# Patient Record
Sex: Male | Born: 1965 | Race: White | Hispanic: No | Marital: Single | Smoking: Never smoker
Health system: Southern US, Community
[De-identification: ages and names within clinical notes are randomized; demographics above are authoritative.]

## PROBLEM LIST (undated history)

## (undated) DIAGNOSIS — F329 Major depressive disorder, single episode, unspecified: Secondary | ICD-10-CM

## (undated) DIAGNOSIS — F410 Panic disorder [episodic paroxysmal anxiety] without agoraphobia: Secondary | ICD-10-CM

## (undated) DIAGNOSIS — I10 Essential (primary) hypertension: Secondary | ICD-10-CM

## (undated) DIAGNOSIS — M940 Chondrocostal junction syndrome [Tietze]: Secondary | ICD-10-CM

## (undated) DIAGNOSIS — E0590 Thyrotoxicosis, unspecified without thyrotoxic crisis or storm: Secondary | ICD-10-CM

## (undated) DIAGNOSIS — K219 Gastro-esophageal reflux disease without esophagitis: Secondary | ICD-10-CM

## (undated) DIAGNOSIS — F419 Anxiety disorder, unspecified: Secondary | ICD-10-CM

## (undated) DIAGNOSIS — R5383 Other fatigue: Secondary | ICD-10-CM

## (undated) DIAGNOSIS — R251 Tremor, unspecified: Secondary | ICD-10-CM

## (undated) DIAGNOSIS — T6701XA Heatstroke and sunstroke, initial encounter: Secondary | ICD-10-CM

## (undated) DIAGNOSIS — N419 Inflammatory disease of prostate, unspecified: Secondary | ICD-10-CM

## (undated) DIAGNOSIS — K589 Irritable bowel syndrome without diarrhea: Secondary | ICD-10-CM

## (undated) DIAGNOSIS — F32A Depression, unspecified: Secondary | ICD-10-CM

## (undated) HISTORY — PX: MANDIBLE SURGERY: SHX707

## (undated) HISTORY — DX: Thyrotoxicosis, unspecified without thyrotoxic crisis or storm: E05.90

## (undated) HISTORY — DX: Tremor, unspecified: R25.1

## (undated) HISTORY — DX: Other fatigue: R53.83

## (undated) HISTORY — DX: Major depressive disorder, single episode, unspecified: F32.9

## (undated) HISTORY — DX: Essential (primary) hypertension: I10

## (undated) HISTORY — DX: Heatstroke and sunstroke, initial encounter: T67.01XA

## (undated) HISTORY — DX: Gastro-esophageal reflux disease without esophagitis: K21.9

## (undated) HISTORY — DX: Anxiety disorder, unspecified: F41.9

## (undated) HISTORY — DX: Chondrocostal junction syndrome (tietze): M94.0

## (undated) HISTORY — DX: Panic disorder (episodic paroxysmal anxiety): F41.0

## (undated) HISTORY — DX: Depression, unspecified: F32.A

## (undated) HISTORY — DX: Irritable bowel syndrome, unspecified: K58.9

## (undated) HISTORY — DX: Inflammatory disease of prostate, unspecified: N41.9

---

## 1994-09-14 ENCOUNTER — Encounter: Payer: Self-pay | Admitting: Gastroenterology

## 1994-09-15 ENCOUNTER — Encounter: Payer: Self-pay | Admitting: Gastroenterology

## 1995-12-07 ENCOUNTER — Encounter: Payer: Self-pay | Admitting: Gastroenterology

## 1997-08-02 ENCOUNTER — Encounter: Payer: Self-pay | Admitting: Gastroenterology

## 1997-08-26 ENCOUNTER — Encounter: Payer: Self-pay | Admitting: Gastroenterology

## 1997-08-27 ENCOUNTER — Encounter: Payer: Self-pay | Admitting: Gastroenterology

## 2004-08-28 ENCOUNTER — Ambulatory Visit: Payer: Self-pay | Admitting: Internal Medicine

## 2004-09-04 ENCOUNTER — Ambulatory Visit (HOSPITAL_COMMUNITY): Admission: RE | Admit: 2004-09-04 | Discharge: 2004-09-04 | Payer: Self-pay | Admitting: Internal Medicine

## 2005-04-29 ENCOUNTER — Ambulatory Visit: Payer: Self-pay | Admitting: Internal Medicine

## 2005-09-10 ENCOUNTER — Emergency Department (HOSPITAL_COMMUNITY): Admission: EM | Admit: 2005-09-10 | Discharge: 2005-09-10 | Payer: Self-pay | Admitting: Emergency Medicine

## 2005-09-10 ENCOUNTER — Ambulatory Visit: Payer: Self-pay | Admitting: Internal Medicine

## 2005-09-29 ENCOUNTER — Ambulatory Visit: Payer: Self-pay | Admitting: Internal Medicine

## 2006-02-09 ENCOUNTER — Ambulatory Visit: Payer: Self-pay | Admitting: Internal Medicine

## 2006-03-18 ENCOUNTER — Ambulatory Visit: Payer: Self-pay | Admitting: Internal Medicine

## 2006-03-18 ENCOUNTER — Ambulatory Visit (HOSPITAL_COMMUNITY): Admission: RE | Admit: 2006-03-18 | Discharge: 2006-03-18 | Payer: Self-pay | Admitting: Internal Medicine

## 2006-04-06 ENCOUNTER — Ambulatory Visit: Payer: Self-pay | Admitting: Internal Medicine

## 2006-05-23 ENCOUNTER — Ambulatory Visit: Payer: Self-pay | Admitting: Internal Medicine

## 2006-08-23 ENCOUNTER — Ambulatory Visit: Payer: Self-pay | Admitting: Internal Medicine

## 2006-12-02 ENCOUNTER — Ambulatory Visit: Payer: Self-pay | Admitting: Internal Medicine

## 2006-12-20 ENCOUNTER — Ambulatory Visit: Payer: Self-pay | Admitting: Internal Medicine

## 2007-01-17 ENCOUNTER — Ambulatory Visit: Payer: Self-pay | Admitting: Internal Medicine

## 2007-04-11 ENCOUNTER — Ambulatory Visit: Payer: Self-pay | Admitting: Internal Medicine

## 2007-04-11 LAB — CONVERTED CEMR LAB
ALT: 28 units/L (ref 0–53)
AST: 27 units/L (ref 0–37)
Albumin: 4.5 g/dL (ref 3.5–5.2)
Alkaline Phosphatase: 54 units/L (ref 39–117)
BUN: 9 mg/dL (ref 6–23)
Basophils Absolute: 0 10*3/uL (ref 0.0–0.1)
Basophils Relative: 0.6 % (ref 0.0–1.0)
Bilirubin Urine: NEGATIVE
Bilirubin, Direct: 0.2 mg/dL (ref 0.0–0.3)
CO2: 28 meq/L (ref 19–32)
Calcium: 9.4 mg/dL (ref 8.4–10.5)
Chloride: 105 meq/L (ref 96–112)
Cholesterol: 171 mg/dL (ref 0–200)
Creatinine, Ser: 1.1 mg/dL (ref 0.4–1.5)
Eosinophils Absolute: 0.1 10*3/uL (ref 0.0–0.6)
Eosinophils Relative: 0.8 % (ref 0.0–5.0)
GFR calc Af Amer: 95 mL/min
GFR calc non Af Amer: 79 mL/min
Glucose, Bld: 101 mg/dL — ABNORMAL HIGH (ref 70–99)
HCT: 39.9 % (ref 39.0–52.0)
HDL: 50.5 mg/dL (ref >39.0)
HIV-1 antibody: NEGATIVE
HIV-2 Ab: NEGATIVE
Hemoglobin, Urine: NEGATIVE
Hemoglobin: 13.3 g/dL (ref 13.0–17.0)
Ketones, ur: NEGATIVE mg/dL
LDL Cholesterol: 101 mg/dL — ABNORMAL HIGH (ref 0–99)
Leukocytes, UA: NEGATIVE
Lymphocytes Relative: 18.2 % (ref 12.0–46.0)
MCHC: 33.3 g/dL (ref 30.0–36.0)
MCV: 76.8 fL — ABNORMAL LOW (ref 78.0–100.0)
Monocytes Absolute: 0.4 10*3/uL (ref 0.2–0.7)
Monocytes Relative: 5.6 % (ref 3.0–11.0)
Neutro Abs: 5.2 10*3/uL (ref 1.4–7.7)
Neutrophils Relative %: 74.8 % (ref 43.0–77.0)
Nitrite: NEGATIVE
PSA: 0.81 ng/mL (ref 0.10–4.00)
Platelets: 312 10*3/uL (ref 150–400)
Potassium: 4.3 meq/L (ref 3.5–5.1)
RBC: 5.19 M/uL (ref 4.22–5.81)
RDW: 12.8 % (ref 11.5–14.6)
Sodium: 141 meq/L (ref 135–145)
Specific Gravity, Urine: 1.015 (ref 1.000–1.03)
TSH: 2.37 microintl units/mL (ref 0.35–5.50)
Total Bilirubin: 1.3 mg/dL — ABNORMAL HIGH (ref 0.3–1.2)
Total CHOL/HDL Ratio: 3.4
Total Protein, Urine: NEGATIVE mg/dL
Total Protein: 7.2 g/dL (ref 6.0–8.3)
Triglycerides: 96 mg/dL (ref 0–149)
Urine Glucose: NEGATIVE mg/dL
Urobilinogen, UA: 0.2 (ref 0.0–1.0)
VLDL: 19 mg/dL (ref 0–40)
WBC: 7 10*3/uL (ref 4.5–10.5)
pH: 6 (ref 5.0–8.0)

## 2007-06-07 ENCOUNTER — Ambulatory Visit: Payer: Self-pay | Admitting: Internal Medicine

## 2007-06-10 ENCOUNTER — Encounter: Payer: Self-pay | Admitting: Internal Medicine

## 2007-06-27 ENCOUNTER — Ambulatory Visit: Payer: Self-pay | Admitting: Internal Medicine

## 2007-06-27 ENCOUNTER — Encounter: Payer: Self-pay | Admitting: Internal Medicine

## 2007-06-27 DIAGNOSIS — F4323 Adjustment disorder with mixed anxiety and depressed mood: Secondary | ICD-10-CM | POA: Insufficient documentation

## 2007-08-29 ENCOUNTER — Ambulatory Visit: Payer: Self-pay | Admitting: Internal Medicine

## 2007-08-29 DIAGNOSIS — F419 Anxiety disorder, unspecified: Secondary | ICD-10-CM | POA: Insufficient documentation

## 2007-08-29 DIAGNOSIS — I10 Essential (primary) hypertension: Secondary | ICD-10-CM | POA: Insufficient documentation

## 2007-08-29 DIAGNOSIS — F410 Panic disorder [episodic paroxysmal anxiety] without agoraphobia: Secondary | ICD-10-CM | POA: Insufficient documentation

## 2007-08-29 DIAGNOSIS — R259 Unspecified abnormal involuntary movements: Secondary | ICD-10-CM | POA: Insufficient documentation

## 2007-08-29 LAB — CONVERTED CEMR LAB
Ceruloplasmin: 25 mg/dL (ref 21–63)
Vit D, 1,25-Dihydroxy: 60 (ref 30–89)

## 2007-09-04 LAB — CONVERTED CEMR LAB
ALT: 31 units/L (ref 0–53)
AST: 23 units/L (ref 0–37)
Albumin: 4.9 g/dL (ref 3.5–5.2)
Alkaline Phosphatase: 57 units/L (ref 39–117)
BUN: 10 mg/dL (ref 6–23)
Bilirubin, Direct: 0.2 mg/dL (ref 0.0–0.3)
CO2: 27 meq/L (ref 19–32)
Calcium: 9.9 mg/dL (ref 8.4–10.5)
Chloride: 96 meq/L (ref 96–112)
Cortisol, Plasma: 10.1 ug/dL
Creatinine, Ser: 1.1 mg/dL (ref 0.4–1.5)
Folate: 8.6 ng/mL
GFR calc Af Amer: 95 mL/min
GFR calc non Af Amer: 79 mL/min
Glucose, Bld: 98 mg/dL (ref 70–99)
Magnesium: 2.2 mg/dL (ref 1.5–2.5)
Potassium: 3.7 meq/L (ref 3.5–5.1)
Sed Rate: 2 mm/hr (ref 0–20)
Sodium: 137 meq/L (ref 135–145)
TSH: 2.63 microintl units/mL (ref 0.35–5.50)
Total Bilirubin: 1 mg/dL (ref 0.3–1.2)
Total Protein: 7.7 g/dL (ref 6.0–8.3)
Vitamin B-12: 380 pg/mL (ref 211–911)

## 2007-09-08 ENCOUNTER — Encounter: Payer: Self-pay | Admitting: Internal Medicine

## 2007-09-28 DIAGNOSIS — R5383 Other fatigue: Secondary | ICD-10-CM

## 2007-09-28 HISTORY — DX: Other fatigue: R53.83

## 2007-10-10 ENCOUNTER — Ambulatory Visit: Payer: Self-pay | Admitting: Internal Medicine

## 2007-10-10 DIAGNOSIS — G4700 Insomnia, unspecified: Secondary | ICD-10-CM | POA: Insufficient documentation

## 2007-10-10 DIAGNOSIS — R197 Diarrhea, unspecified: Secondary | ICD-10-CM | POA: Insufficient documentation

## 2007-10-10 DIAGNOSIS — R071 Chest pain on breathing: Secondary | ICD-10-CM | POA: Insufficient documentation

## 2007-10-11 ENCOUNTER — Encounter: Payer: Self-pay | Admitting: Internal Medicine

## 2007-10-23 ENCOUNTER — Encounter (INDEPENDENT_AMBULATORY_CARE_PROVIDER_SITE_OTHER): Payer: Self-pay | Admitting: *Deleted

## 2007-10-27 ENCOUNTER — Encounter: Payer: Self-pay | Admitting: Internal Medicine

## 2007-11-01 ENCOUNTER — Encounter: Payer: Self-pay | Admitting: Internal Medicine

## 2007-11-08 ENCOUNTER — Encounter: Payer: Self-pay | Admitting: Internal Medicine

## 2007-11-16 ENCOUNTER — Encounter: Payer: Self-pay | Admitting: Internal Medicine

## 2008-01-09 ENCOUNTER — Ambulatory Visit: Payer: Self-pay | Admitting: Internal Medicine

## 2008-01-09 DIAGNOSIS — R142 Eructation: Secondary | ICD-10-CM

## 2008-01-09 DIAGNOSIS — R143 Flatulence: Secondary | ICD-10-CM

## 2008-01-09 DIAGNOSIS — R141 Gas pain: Secondary | ICD-10-CM | POA: Insufficient documentation

## 2008-03-20 ENCOUNTER — Encounter: Payer: Self-pay | Admitting: Internal Medicine

## 2008-04-16 ENCOUNTER — Ambulatory Visit: Payer: Self-pay | Admitting: Internal Medicine

## 2008-04-16 DIAGNOSIS — R5381 Other malaise: Secondary | ICD-10-CM

## 2008-04-16 DIAGNOSIS — R5383 Other fatigue: Secondary | ICD-10-CM

## 2008-04-16 DIAGNOSIS — R5382 Chronic fatigue, unspecified: Secondary | ICD-10-CM | POA: Insufficient documentation

## 2008-06-28 ENCOUNTER — Ambulatory Visit: Payer: Self-pay | Admitting: Internal Medicine

## 2008-06-28 DIAGNOSIS — J45909 Unspecified asthma, uncomplicated: Secondary | ICD-10-CM | POA: Insufficient documentation

## 2008-06-28 DIAGNOSIS — J209 Acute bronchitis, unspecified: Secondary | ICD-10-CM

## 2008-07-01 ENCOUNTER — Ambulatory Visit: Payer: Self-pay | Admitting: Internal Medicine

## 2008-07-01 LAB — CONVERTED CEMR LAB
ALT: 26 units/L (ref 0–53)
AST: 21 units/L (ref 0–37)
Albumin: 3.8 g/dL (ref 3.5–5.2)
Alkaline Phosphatase: 70 units/L (ref 39–117)
BUN: 9 mg/dL (ref 6–23)
Basophils Absolute: 0.1 10*3/uL (ref 0.0–0.1)
Basophils Relative: 1 % (ref 0.0–3.0)
Bilirubin, Direct: 0.2 mg/dL (ref 0.0–0.3)
CO2: 30 meq/L (ref 19–32)
Calcium: 8.8 mg/dL (ref 8.4–10.5)
Chloride: 104 meq/L (ref 96–112)
Creatinine, Ser: 1.1 mg/dL (ref 0.4–1.5)
Eosinophils Absolute: 0.2 10*3/uL (ref 0.0–0.7)
Eosinophils Relative: 3.4 % (ref 0.0–5.0)
GFR calc Af Amer: 95 mL/min
GFR calc non Af Amer: 78 mL/min
Glucose, Bld: 65 mg/dL — ABNORMAL LOW (ref 70–99)
HCT: 41 % (ref 39.0–52.0)
HIV-1 antibody: NEGATIVE
HIV-2 Ab: NEGATIVE
Hemoglobin: 13.6 g/dL (ref 13.0–17.0)
Lymphocytes Relative: 17.4 % (ref 12.0–46.0)
MCHC: 33.1 g/dL (ref 30.0–36.0)
MCV: 77.7 fL — ABNORMAL LOW (ref 78.0–100.0)
Monocytes Absolute: 0.8 10*3/uL (ref 0.1–1.0)
Monocytes Relative: 10.6 % (ref 3.0–12.0)
Neutro Abs: 4.8 10*3/uL (ref 1.4–7.7)
Neutrophils Relative %: 67.6 % (ref 43.0–77.0)
Platelets: 320 10*3/uL (ref 150–400)
Potassium: 4.3 meq/L (ref 3.5–5.1)
RBC: 5.28 M/uL (ref 4.22–5.81)
RDW: 13.5 % (ref 11.5–14.6)
Sodium: 143 meq/L (ref 135–145)
TSH: 1.94 microintl units/mL (ref 0.35–5.50)
Total Bilirubin: 0.7 mg/dL (ref 0.3–1.2)
Total Protein: 6.5 g/dL (ref 6.0–8.3)
Vit D, 1,25-Dihydroxy: 22 — ABNORMAL LOW (ref 30–89)
WBC: 7.1 10*3/uL (ref 4.5–10.5)

## 2008-07-08 ENCOUNTER — Ambulatory Visit: Payer: Self-pay | Admitting: Internal Medicine

## 2008-07-10 ENCOUNTER — Telehealth: Payer: Self-pay | Admitting: Internal Medicine

## 2008-07-17 ENCOUNTER — Telehealth: Payer: Self-pay | Admitting: Internal Medicine

## 2008-08-05 ENCOUNTER — Ambulatory Visit: Payer: Self-pay | Admitting: Internal Medicine

## 2008-08-05 DIAGNOSIS — R1012 Left upper quadrant pain: Secondary | ICD-10-CM | POA: Insufficient documentation

## 2008-08-05 DIAGNOSIS — M545 Low back pain, unspecified: Secondary | ICD-10-CM | POA: Insufficient documentation

## 2008-08-05 LAB — CONVERTED CEMR LAB
BUN: 12 mg/dL (ref 6–23)
Basophils Absolute: 0.1 10*3/uL (ref 0.0–0.1)
Basophils Relative: 0.9 % (ref 0.0–3.0)
Bilirubin Urine: NEGATIVE
CO2: 32 meq/L (ref 19–32)
Calcium: 9.6 mg/dL (ref 8.4–10.5)
Chloride: 102 meq/L (ref 96–112)
Creatinine, Ser: 0.9 mg/dL (ref 0.4–1.5)
Eosinophils Absolute: 0.2 10*3/uL (ref 0.0–0.7)
Eosinophils Relative: 2.2 % (ref 0.0–5.0)
GFR calc Af Amer: 120 mL/min
GFR calc non Af Amer: 99 mL/min
Glucose, Bld: 100 mg/dL — ABNORMAL HIGH (ref 70–99)
HCT: 40.1 % (ref 39.0–52.0)
Hemoglobin, Urine: NEGATIVE
Hemoglobin: 13.5 g/dL (ref 13.0–17.0)
Ketones, ur: NEGATIVE mg/dL
Leukocytes, UA: NEGATIVE
Lymphocytes Relative: 17.8 % (ref 12.0–46.0)
MCHC: 33.8 g/dL (ref 30.0–36.0)
MCV: 77.8 fL — ABNORMAL LOW (ref 78.0–100.0)
Monocytes Absolute: 0.8 10*3/uL (ref 0.1–1.0)
Monocytes Relative: 7.4 % (ref 3.0–12.0)
Neutro Abs: 7.9 10*3/uL — ABNORMAL HIGH (ref 1.4–7.7)
Neutrophils Relative %: 71.7 % (ref 43.0–77.0)
Nitrite: NEGATIVE
Platelets: 247 10*3/uL (ref 150–400)
Potassium: 4.6 meq/L (ref 3.5–5.1)
RBC: 5.15 M/uL (ref 4.22–5.81)
RDW: 13.2 % (ref 11.5–14.6)
Sodium: 142 meq/L (ref 135–145)
Specific Gravity, Urine: <=1.005 (ref 1.000–1.03)
Total Protein, Urine: NEGATIVE mg/dL
Urine Glucose: NEGATIVE mg/dL
Urobilinogen, UA: 0.2 (ref 0.0–1.0)
WBC: 10.9 10*3/uL — ABNORMAL HIGH (ref 4.5–10.5)
pH: 6.5 (ref 5.0–8.0)

## 2008-08-06 ENCOUNTER — Telehealth: Payer: Self-pay | Admitting: Internal Medicine

## 2008-08-06 ENCOUNTER — Ambulatory Visit: Payer: Self-pay | Admitting: Internal Medicine

## 2008-08-12 ENCOUNTER — Telehealth: Payer: Self-pay | Admitting: Internal Medicine

## 2008-08-16 ENCOUNTER — Ambulatory Visit: Payer: Self-pay | Admitting: Internal Medicine

## 2008-08-16 DIAGNOSIS — R300 Dysuria: Secondary | ICD-10-CM | POA: Insufficient documentation

## 2008-08-19 ENCOUNTER — Telehealth: Payer: Self-pay | Admitting: Internal Medicine

## 2008-08-19 LAB — CONVERTED CEMR LAB
Bilirubin Urine: NEGATIVE
Hemoglobin, Urine: NEGATIVE
Ketones, ur: NEGATIVE mg/dL
Leukocytes, UA: NEGATIVE
Nitrite: NEGATIVE
Specific Gravity, Urine: <=1.005 (ref 1.000–1.03)
Total Protein, Urine: NEGATIVE mg/dL
Urine Glucose: NEGATIVE mg/dL
Urobilinogen, UA: 0.2 (ref 0.0–1.0)
pH: 6.5 (ref 5.0–8.0)

## 2008-08-26 ENCOUNTER — Ambulatory Visit: Payer: Self-pay | Admitting: Internal Medicine

## 2008-08-26 ENCOUNTER — Telehealth: Payer: Self-pay | Admitting: Internal Medicine

## 2008-08-26 DIAGNOSIS — L988 Other specified disorders of the skin and subcutaneous tissue: Secondary | ICD-10-CM | POA: Insufficient documentation

## 2008-08-26 DIAGNOSIS — A6000 Herpesviral infection of urogenital system, unspecified: Secondary | ICD-10-CM | POA: Insufficient documentation

## 2008-08-26 LAB — CONVERTED CEMR LAB: Herpes Simplex Vrs I&II-IgM Ab (EIA): DETECTED — AB

## 2008-09-12 ENCOUNTER — Emergency Department (HOSPITAL_COMMUNITY): Admission: EM | Admit: 2008-09-12 | Discharge: 2008-09-12 | Payer: Self-pay | Admitting: Emergency Medicine

## 2008-11-13 ENCOUNTER — Ambulatory Visit: Payer: Self-pay | Admitting: Internal Medicine

## 2008-11-21 ENCOUNTER — Telehealth: Payer: Self-pay | Admitting: Internal Medicine

## 2008-11-27 ENCOUNTER — Telehealth: Payer: Self-pay | Admitting: Internal Medicine

## 2008-11-28 ENCOUNTER — Telehealth: Payer: Self-pay | Admitting: Internal Medicine

## 2009-01-06 ENCOUNTER — Telehealth: Payer: Self-pay | Admitting: Internal Medicine

## 2009-02-11 ENCOUNTER — Ambulatory Visit: Payer: Self-pay | Admitting: Internal Medicine

## 2009-02-11 DIAGNOSIS — R159 Full incontinence of feces: Secondary | ICD-10-CM | POA: Insufficient documentation

## 2009-02-11 DIAGNOSIS — F410 Panic disorder [episodic paroxysmal anxiety] without agoraphobia: Secondary | ICD-10-CM | POA: Insufficient documentation

## 2009-02-11 HISTORY — DX: Panic disorder (episodic paroxysmal anxiety): F41.0

## 2009-02-11 LAB — CONVERTED CEMR LAB: Vit D, 25-Hydroxy: 40 ng/mL (ref 30–89)

## 2009-02-12 ENCOUNTER — Encounter (INDEPENDENT_AMBULATORY_CARE_PROVIDER_SITE_OTHER): Payer: Self-pay | Admitting: *Deleted

## 2009-02-13 LAB — CONVERTED CEMR LAB
ALT: 48 units/L (ref 0–53)
AST: 34 units/L (ref 0–37)
Albumin: 4.2 g/dL (ref 3.5–5.2)
Alkaline Phosphatase: 59 units/L (ref 39–117)
BUN: 8 mg/dL (ref 6–23)
Basophils Absolute: 0 10*3/uL (ref 0.0–0.1)
Basophils Relative: 0.3 % (ref 0.0–3.0)
Bilirubin Urine: NEGATIVE
Bilirubin, Direct: 0.2 mg/dL (ref 0.0–0.3)
CO2: 31 meq/L (ref 19–32)
Calcium: 9.4 mg/dL (ref 8.4–10.5)
Chloride: 106 meq/L (ref 96–112)
Creatinine, Ser: 1 mg/dL (ref 0.4–1.5)
Eosinophils Absolute: 0.2 10*3/uL (ref 0.0–0.7)
Eosinophils Relative: 3.5 % (ref 0.0–5.0)
Folate: 9.8 ng/mL
GFR calc non Af Amer: 87.16 mL/min (ref >60)
Glucose, Bld: 89 mg/dL (ref 70–99)
HCT: 38.7 % — ABNORMAL LOW (ref 39.0–52.0)
Hemoglobin, Urine: NEGATIVE
Hemoglobin: 12.9 g/dL — ABNORMAL LOW (ref 13.0–17.0)
Ketones, ur: NEGATIVE mg/dL
Leukocytes, UA: NEGATIVE
Lipase: 4 units/L — ABNORMAL LOW (ref 11.0–59.0)
Lymphocytes Relative: 34.3 % (ref 12.0–46.0)
Lymphs Abs: 2.3 10*3/uL (ref 0.7–4.0)
MCHC: 33.4 g/dL (ref 30.0–36.0)
MCV: 78.3 fL (ref 78.0–100.0)
Monocytes Absolute: 0.4 10*3/uL (ref 0.1–1.0)
Monocytes Relative: 5.6 % (ref 3.0–12.0)
Neutro Abs: 3.8 10*3/uL (ref 1.4–7.7)
Neutrophils Relative %: 56.3 % (ref 43.0–77.0)
Nitrite: NEGATIVE
Platelets: 304 10*3/uL (ref 150.0–400.0)
Potassium: 4.4 meq/L (ref 3.5–5.1)
RBC: 4.94 M/uL (ref 4.22–5.81)
RDW: 12.9 % (ref 11.5–14.6)
Sed Rate: 9 mm/hr (ref 0–22)
Sodium: 143 meq/L (ref 135–145)
Specific Gravity, Urine: <=1.005 (ref 1.000–1.030)
TSH: 1.82 microintl units/mL (ref 0.35–5.50)
Total Bilirubin: 0.7 mg/dL (ref 0.3–1.2)
Total Protein, Urine: NEGATIVE mg/dL
Total Protein: 7.5 g/dL (ref 6.0–8.3)
Urine Glucose: NEGATIVE mg/dL
Urobilinogen, UA: 0.2 (ref 0.0–1.0)
Vitamin B-12: 534 pg/mL (ref 211–911)
WBC: 6.7 10*3/uL (ref 4.5–10.5)
pH: 7 (ref 5.0–8.0)

## 2009-02-20 ENCOUNTER — Encounter: Payer: Self-pay | Admitting: Internal Medicine

## 2009-03-10 ENCOUNTER — Ambulatory Visit: Payer: Self-pay | Admitting: Gastroenterology

## 2009-03-10 DIAGNOSIS — D649 Anemia, unspecified: Secondary | ICD-10-CM | POA: Insufficient documentation

## 2009-03-10 DIAGNOSIS — R1032 Left lower quadrant pain: Secondary | ICD-10-CM | POA: Insufficient documentation

## 2009-03-11 LAB — CONVERTED CEMR LAB: Tissue Transglutaminase Ab, IgA: 0.3 units (ref <7)

## 2009-03-13 ENCOUNTER — Encounter: Payer: Self-pay | Admitting: Internal Medicine

## 2009-03-19 ENCOUNTER — Ambulatory Visit: Payer: Self-pay | Admitting: Internal Medicine

## 2009-03-21 ENCOUNTER — Ambulatory Visit: Payer: Self-pay | Admitting: Gastroenterology

## 2009-03-21 ENCOUNTER — Telehealth: Payer: Self-pay | Admitting: Gastroenterology

## 2009-03-24 ENCOUNTER — Encounter (INDEPENDENT_AMBULATORY_CARE_PROVIDER_SITE_OTHER): Payer: Self-pay

## 2009-03-24 LAB — CONVERTED CEMR LAB
Iron: 79 ug/dL (ref 42–165)
Saturation Ratios: 19.4 % — ABNORMAL LOW (ref 20.0–50.0)
Transferrin: 291.4 mg/dL (ref 212.0–360.0)

## 2009-04-10 ENCOUNTER — Telehealth: Payer: Self-pay | Admitting: Internal Medicine

## 2009-04-16 ENCOUNTER — Encounter: Payer: Self-pay | Admitting: Gastroenterology

## 2009-04-16 ENCOUNTER — Ambulatory Visit: Payer: Self-pay | Admitting: Gastroenterology

## 2009-04-18 ENCOUNTER — Encounter: Payer: Self-pay | Admitting: Gastroenterology

## 2009-05-05 ENCOUNTER — Ambulatory Visit: Payer: Self-pay | Admitting: Internal Medicine

## 2009-05-29 ENCOUNTER — Encounter: Payer: Self-pay | Admitting: Internal Medicine

## 2009-08-11 ENCOUNTER — Ambulatory Visit: Payer: Self-pay | Admitting: Internal Medicine

## 2009-08-13 LAB — CONVERTED CEMR LAB
ALT: 40 units/L (ref 0–53)
AST: 25 units/L (ref 0–37)
Albumin: 4.8 g/dL (ref 3.5–5.2)
Alkaline Phosphatase: 56 units/L (ref 39–117)
BUN: 8 mg/dL (ref 6–23)
Basophils Absolute: 0 10*3/uL (ref 0.0–0.1)
Basophils Relative: 0.4 % (ref 0.0–3.0)
Bilirubin, Direct: 0.2 mg/dL (ref 0.0–0.3)
CO2: 30 meq/L (ref 19–32)
Calcium: 9.8 mg/dL (ref 8.4–10.5)
Chloride: 100 meq/L (ref 96–112)
Creatinine, Ser: 1.1 mg/dL (ref 0.4–1.5)
Eosinophils Absolute: 0.2 10*3/uL (ref 0.0–0.7)
Eosinophils Relative: 2.8 % (ref 0.0–5.0)
Folate: 14.7 ng/mL
GFR calc non Af Amer: 77.89 mL/min (ref >60)
Glucose, Bld: 101 mg/dL — ABNORMAL HIGH (ref 70–99)
HCT: 42.4 % (ref 39.0–52.0)
Hemoglobin: 13.7 g/dL (ref 13.0–17.0)
IgE (Immunoglobulin E), Serum: 16.2 intl units/mL (ref 0.0–180.0)
Lymphocytes Relative: 25.8 % (ref 12.0–46.0)
Lymphs Abs: 2 10*3/uL (ref 0.7–4.0)
MCHC: 32.3 g/dL (ref 30.0–36.0)
MCV: 81.8 fL (ref 78.0–100.0)
Monocytes Absolute: 0.5 10*3/uL (ref 0.1–1.0)
Monocytes Relative: 6.3 % (ref 3.0–12.0)
Neutro Abs: 5.2 10*3/uL (ref 1.4–7.7)
Neutrophils Relative %: 64.7 % (ref 43.0–77.0)
Platelets: 307 10*3/uL (ref 150.0–400.0)
Potassium: 4.5 meq/L (ref 3.5–5.1)
RBC: 5.19 M/uL (ref 4.22–5.81)
RDW: 12.6 % (ref 11.5–14.6)
Sodium: 141 meq/L (ref 135–145)
TSH: 2.08 microintl units/mL (ref 0.35–5.50)
Total Bilirubin: 1.2 mg/dL (ref 0.3–1.2)
Total Protein: 8 g/dL (ref 6.0–8.3)
Vitamin B-12: 505 pg/mL (ref 211–911)
WBC: 7.9 10*3/uL (ref 4.5–10.5)

## 2009-09-17 ENCOUNTER — Telehealth: Payer: Self-pay | Admitting: Internal Medicine

## 2009-09-23 ENCOUNTER — Telehealth: Payer: Self-pay | Admitting: Internal Medicine

## 2009-10-22 ENCOUNTER — Telehealth: Payer: Self-pay | Admitting: Internal Medicine

## 2009-11-10 ENCOUNTER — Ambulatory Visit: Payer: Self-pay | Admitting: Internal Medicine

## 2009-11-10 DIAGNOSIS — N419 Inflammatory disease of prostate, unspecified: Secondary | ICD-10-CM | POA: Insufficient documentation

## 2009-11-10 LAB — CONVERTED CEMR LAB
Basophils Absolute: 0 10*3/uL (ref 0.0–0.1)
Basophils Relative: 0.7 % (ref 0.0–3.0)
Bilirubin Urine: NEGATIVE
Eosinophils Absolute: 0.3 10*3/uL (ref 0.0–0.7)
Eosinophils Relative: 4.5 % (ref 0.0–5.0)
HCT: 40 % (ref 39.0–52.0)
Hemoglobin, Urine: NEGATIVE
Hemoglobin: 13.1 g/dL (ref 13.0–17.0)
Ketones, ur: NEGATIVE mg/dL
Leukocytes, UA: NEGATIVE
Lymphocytes Relative: 24.4 % (ref 12.0–46.0)
Lymphs Abs: 1.6 10*3/uL (ref 0.7–4.0)
MCHC: 32.6 g/dL (ref 30.0–36.0)
MCV: 79 fL (ref 78.0–100.0)
Monocytes Absolute: 0.6 10*3/uL (ref 0.1–1.0)
Monocytes Relative: 8.8 % (ref 3.0–12.0)
Neutro Abs: 4 10*3/uL (ref 1.4–7.7)
Neutrophils Relative %: 61.6 % (ref 43.0–77.0)
Nitrite: NEGATIVE
Platelets: 292 10*3/uL (ref 150.0–400.0)
RBC: 5.07 M/uL (ref 4.22–5.81)
RDW: 14.2 % (ref 11.5–14.6)
Specific Gravity, Urine: >=1.03 (ref 1.000–1.030)
Total Protein, Urine: NEGATIVE mg/dL
Urine Glucose: NEGATIVE mg/dL
Urobilinogen, UA: 0.2 (ref 0.0–1.0)
WBC: 6.5 10*3/uL (ref 4.5–10.5)
pH: 5 (ref 5.0–8.0)

## 2009-11-14 LAB — CONVERTED CEMR LAB
HIV-1 antibody: NEGATIVE
HIV-2 Ab: NEGATIVE

## 2009-12-05 ENCOUNTER — Telehealth: Payer: Self-pay | Admitting: Endocrinology

## 2010-01-26 ENCOUNTER — Ambulatory Visit: Payer: Self-pay | Admitting: Internal Medicine

## 2010-02-06 ENCOUNTER — Telehealth: Payer: Self-pay | Admitting: Internal Medicine

## 2010-02-11 ENCOUNTER — Telehealth: Payer: Self-pay | Admitting: Internal Medicine

## 2010-02-12 ENCOUNTER — Encounter: Payer: Self-pay | Admitting: Internal Medicine

## 2010-03-25 ENCOUNTER — Telehealth: Payer: Self-pay | Admitting: Internal Medicine

## 2010-04-14 ENCOUNTER — Ambulatory Visit: Payer: Self-pay | Admitting: Internal Medicine

## 2010-05-13 ENCOUNTER — Telehealth: Payer: Self-pay | Admitting: Internal Medicine

## 2010-06-08 ENCOUNTER — Telehealth: Payer: Self-pay | Admitting: Internal Medicine

## 2010-06-22 ENCOUNTER — Ambulatory Visit: Payer: Self-pay | Admitting: Internal Medicine

## 2010-06-23 LAB — CONVERTED CEMR LAB
ALT: 38 units/L (ref 0–53)
AST: 24 units/L (ref 0–37)
Albumin: 4.5 g/dL (ref 3.5–5.2)
Alkaline Phosphatase: 50 units/L (ref 39–117)
BUN: 13 mg/dL (ref 6–23)
Basophils Absolute: 0.1 10*3/uL (ref 0.0–0.1)
Basophils Relative: 0.9 % (ref 0.0–3.0)
Bilirubin, Direct: 0.1 mg/dL (ref 0.0–0.3)
CO2: 27 meq/L (ref 19–32)
Calcium: 9.6 mg/dL (ref 8.4–10.5)
Chloride: 107 meq/L (ref 96–112)
Creatinine, Ser: 0.9 mg/dL (ref 0.4–1.5)
Eosinophils Absolute: 0.4 10*3/uL (ref 0.0–0.7)
Eosinophils Relative: 5.3 % — ABNORMAL HIGH (ref 0.0–5.0)
GFR calc non Af Amer: 94.16 mL/min (ref >60)
Glucose, Bld: 91 mg/dL (ref 70–99)
HCT: 41.3 % (ref 39.0–52.0)
Hemoglobin: 13.7 g/dL (ref 13.0–17.0)
Lymphocytes Relative: 38.4 % (ref 12.0–46.0)
Lymphs Abs: 2.7 10*3/uL (ref 0.7–4.0)
MCHC: 33.1 g/dL (ref 30.0–36.0)
MCV: 77 fL — ABNORMAL LOW (ref 78.0–100.0)
Monocytes Absolute: 0.6 10*3/uL (ref 0.1–1.0)
Monocytes Relative: 8.4 % (ref 3.0–12.0)
Neutro Abs: 3.3 10*3/uL (ref 1.4–7.7)
Neutrophils Relative %: 47 % (ref 43.0–77.0)
Platelets: 274 10*3/uL (ref 150.0–400.0)
Potassium: 4.5 meq/L (ref 3.5–5.1)
RBC: 5.37 M/uL (ref 4.22–5.81)
RDW: 14.8 % — ABNORMAL HIGH (ref 11.5–14.6)
Sodium: 141 meq/L (ref 135–145)
TSH: 3.33 microintl units/mL (ref 0.35–5.50)
Total Bilirubin: 0.6 mg/dL (ref 0.3–1.2)
Total Protein: 7.2 g/dL (ref 6.0–8.3)
WBC: 7.1 10*3/uL (ref 4.5–10.5)

## 2010-06-24 LAB — CONVERTED CEMR LAB: Lithium Lvl: 0.14 meq/L — ABNORMAL LOW (ref 0.80–1.40)

## 2010-07-21 ENCOUNTER — Ambulatory Visit: Payer: Self-pay | Admitting: Internal Medicine

## 2010-07-23 ENCOUNTER — Encounter: Payer: Self-pay | Admitting: Internal Medicine

## 2010-08-14 ENCOUNTER — Telehealth: Payer: Self-pay | Admitting: Internal Medicine

## 2010-09-25 ENCOUNTER — Ambulatory Visit: Payer: Self-pay | Admitting: Internal Medicine

## 2010-10-22 ENCOUNTER — Telehealth: Payer: Self-pay | Admitting: Internal Medicine

## 2010-10-22 ENCOUNTER — Ambulatory Visit
Admission: RE | Admit: 2010-10-22 | Discharge: 2010-10-22 | Payer: Self-pay | Source: Home / Self Care | Attending: Internal Medicine | Admitting: Internal Medicine

## 2010-10-22 ENCOUNTER — Emergency Department (HOSPITAL_COMMUNITY)
Admission: EM | Admit: 2010-10-22 | Discharge: 2010-10-22 | Payer: Self-pay | Source: Home / Self Care | Admitting: Emergency Medicine

## 2010-10-22 ENCOUNTER — Other Ambulatory Visit: Payer: Self-pay | Admitting: Internal Medicine

## 2010-10-22 DIAGNOSIS — N23 Unspecified renal colic: Secondary | ICD-10-CM | POA: Insufficient documentation

## 2010-10-22 LAB — DIFFERENTIAL
Basophils Relative: 1 % (ref 0–1)
Eosinophils Absolute: 0.2 10*3/uL (ref 0.0–0.7)
Monocytes Absolute: 0.7 10*3/uL (ref 0.1–1.0)
Neutro Abs: 6.7 10*3/uL (ref 1.7–7.7)
Neutrophils Relative %: 72 % (ref 43–77)

## 2010-10-22 LAB — CBC
MCH: 24.5 pg — ABNORMAL LOW (ref 26.0–34.0)
MCHC: 33.5 g/dL (ref 30.0–36.0)
Platelets: 300 10*3/uL (ref 150–400)

## 2010-10-22 LAB — URINALYSIS, ROUTINE W REFLEX MICROSCOPIC
Bilirubin Urine: NEGATIVE
Ketones, ur: NEGATIVE
Ketones, ur: NEGATIVE mg/dL
Nitrite: NEGATIVE
Nitrite: NEGATIVE
Protein, ur: NEGATIVE mg/dL
Specific Gravity, Urine: <=1.005 (ref 1.000–1.030)
pH: 5.5 (ref 5.0–8.0)

## 2010-10-22 LAB — BASIC METABOLIC PANEL
CO2: 25 mEq/L (ref 19–32)
Calcium: 9.9 mg/dL (ref 8.4–10.5)
Creatinine, Ser: 1.21 mg/dL (ref 0.4–1.5)
GFR calc Af Amer: >60 mL/min (ref >60)

## 2010-10-23 ENCOUNTER — Ambulatory Visit: Admit: 2010-10-23 | Payer: Self-pay | Admitting: Internal Medicine

## 2010-10-23 LAB — URINE CULTURE
Colony Count: NO GROWTH
Culture  Setup Time: 201201270016

## 2010-10-23 LAB — GC/CHLAMYDIA PROBE AMP, GENITAL: GC Probe Amp, Genital: NEGATIVE

## 2010-10-29 NOTE — Progress Notes (Signed)
Summary: Refill request  Phone Note Refill Request Message from:  Pharmacy  Refills Requested: Medication #1:  LEVSIN 0.125 MG TABS 1-2 by mouth 2 times daily as needed for abdominal pain   Supply Requested: 6 months  Medication #2:  ACYCLOVIR 400 MG TABS 1 by mouth qid   Supply Requested: 6 months Initial call taken by: Lanier Prude, Beverly Gust),  March 25, 2010 11:38 AM    Prescriptions: ACYCLOVIR 400 MG TABS (ACYCLOVIR) 1 by mouth qid  #28 x 1   Entered and Authorized by:   Etta Grandchild MD   Signed by:   Etta Grandchild MD on 03/25/2010   Method used:   [facility]        [facility] [phone]* (retail)       [address]       [place], [place]  [address]       Ph: [phone] or [phone]       Fax: [phone]   RxID:   [id number] LEVSIN 0.125 MG TABS (HYOSCYAMINE SULFATE) 1-2 by mouth 2 times daily as needed for abdominal pain  #120 x 3   Entered and Authorized by:   Etta Grandchild MD   Signed by:   Etta Grandchild MD on 03/25/2010   Method used:   [facility]        [facility] [phone]* (retail)       [address]       [place], [place]  [address]       Ph: [phone] or [phone]       Fax: [phone]   RxID:   [id number]

## 2010-10-29 NOTE — Assessment & Plan Note (Signed)
Summary: 2 mos f/u // # / cd   Vital Signs:  Patient profile:   45 year old male Height:      68 inches (172.72 cm) Weight:      189 pounds (85.91 kg) BMI:     28.84 O2 Sat:      97 % on Room air Temp:     98.0 degrees F (36.67 degrees C) oral Pulse rate:   96 / minute Pulse rhythm:   regular Resp:     16 per minute BP sitting:   138 / 88  (left arm) Cuff size:   regular  Vitals Entered By: Lanier Prude, CMA(AAMA) (April 14, 2010 2:27 PM)  O2 Flow:  Room air CC: 2 mo f/u Is Patient Diabetic? No Comments pt is not taking Depakote ER 250, Depakote 125mg  or Welchol 3.75   Primary Care Provider:  Sonda Primes, MD  CC:  2 mo f/u.  History of Present Illness: The patient presents for a follow up of back pain, anxiety, depression and headaches - all worse.  C/o more depression, feeling bad all the time; a lot of sadness. He  does not want to get out of the house any more...  Current Medications (verified): 1)  Propranolol Hcl 20 Mg  Tabs (Propranolol Hcl) .[name].. 1 By Mouth Tid 2)  Hydrocodone-Acetaminophen 10-650 Mg  Tabs (Hydrocodone-Acetaminophen) .... Qid As Needed 3)  Vitamin D3 1000 Unit  Tabs (Cholecalciferol) .... 2 Once Daily By Mouth 4)  Ibuprofen 400 Mg  Tabs (Ibuprofen) .[name].. 1 By Mouth Qid As Needed Pain 5)  Budeprion Sr 150 Mg  Xr12h-Tab (Bupropion Hcl) .[name].. 1 By Mouth Bid 6)  Savella 50 Mg  Tabs (Milnacipran Hcl) .[name].. 1 By Mouth Bid 7)  Alprazolam Xr 1 Mg Xr24h-Tab (Alprazolam) .[name].. 1 By Mouth Qam 8)  Levsin 0.125 Mg Tabs (Hyoscyamine Sulfate) .[name].. 1-2 By Mouth 2 Times Daily As Needed For Abdominal Pain 9)  Robinul-Forte 2 Mg Tabs (Glycopyrrolate) .... One Tablet By Mouth Two Times A Day 10)  Repliva 21/7 .... 1 By Mouth Once Daily 11)  Hydroxyzine Hcl 50 Mg Tabs (Hydroxyzine Hcl) .[name].. 1-2 By Mouth By Mouth Two Times A Day As Needed Anxiety 12)  Alprazolam 2 Mg Tabs (Alprazolam) .[name].. 1 By Mouth Three Times A Day As Needed Anxiety 13)  Acyclovir 400 Mg Tabs  (Acyclovir) .[name].. 1 By Mouth Qid 14)  Depakote Er 250 Mg Xr24h-Tab (Divalproex Sodium) .[name].. 1 By Mouth Qd 15)  Welchol 3.75 Gm Pack (Colesevelam Hcl) .[name].. 1 By Mouth Qd 16)  Depakote 125 Mg Tbec (Divalproex Sodium) .[name].. 1 By Mouth Bid  Allergies (verified): 1)  ! 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  The patient denies anorexia, weight loss, weight gain, chest pain, dyspnea on exertion, abdominal pain, and melena.    Physical Exam  General:  In no distress, overweight-appearing.   Head:  Normocephalic and atraumatic without obvious abnormalities. No apparent alopecia or balding. Eyes:  No corneal  or conjunctival inflammation noted. EOMI. Perrla.  Ears:  External ear exam shows no significant lesions or deformities.  Otoscopic examination reveals clear canals, tympanic membranes are intact bilaterally without bulging, retraction, inflammation or discharge. Hearing is grossly normal bilaterally. Mouth:  Oral mucosa and oropharynx without lesions or exudates.  Teeth in good repair. Neck:  No deformities, masses, or tenderness noted. Lungs:  Normal respiratory effort, chest expands symmetrically. Lungs are clear to auscultation, no crackles or wheezes. Heart:  Normal rate and regular rhythm. S1 and S2 normal without gallop, murmur, click, rub or other extra sounds. Abdomen:  Bowel sounds positive,abdomen soft and non-tender without  masses, organomegaly or hernias noted. Msk:  No deformity or scoliosis noted of thoracic or lumbar spine.   Neurologic:  alert & oriented X3 and gait normal.  Tremor in B hands is present Skin:  WNL Cervical Nodes:  L anterior LN tender.   Psych:  Not suisidal Oriented X3, not homicidal, depressed affect, subdued,  tearful, less severely anxious, poor concentration, and judgment fair.     Impression & Recommendations:  Problem # 1:  PANIC DISORDER (ICD-300.01) Assessment Unchanged  His updated medication list for this problem includes:    Budeprion Sr 150 Mg Xr12h-tab (Bupropion hcl) .[name]... 1 by mouth bid    Alprazolam Xr 1 Mg Xr24h-tab (Alprazolam) .[name]... 1 by mouth qam    Hydroxyzine Hcl 50 Mg Tabs (Hydroxyzine hcl) .[name]... 1-2 by mouth by mouth two times a day as needed anxiety    Alprazolam 2 Mg Tabs (Alprazolam) .[name]... 1 by mouth three times a day as needed anxiety  Problem # 2:  DEPRESSION (ICD-311) Assessment: Deteriorated I'm running out of options here. We wwill try Lithium. Risks vs benefits and controversies of a long term Lithium use were discussed.  His updated medication list for this problem includes:    Budeprion Sr 150 Mg Xr12h-tab (Bupropion hcl) .[name]... 1 by mouth bid    Alprazolam Xr 1 Mg Xr24h-tab (Alprazolam) .[name]... 1 by mouth qam    Hydroxyzine Hcl 50 Mg Tabs (Hydroxyzine hcl) .[name]... 1-2 by mouth by mouth two times a day as needed anxiety    Alprazolam 2 Mg Tabs (Alprazolam) .[name]... 1 by mouth three times a day as needed anxiety  Orders: Psychiatric Referral (Psych)  Problem # 3:  HYPERTENSION (ICD-401.1) Assessment: Unchanged  The following medications were removed from the medication list:    Propranolol Hcl 20 Mg Tabs (Propranolol hcl) .[name]... 1 by mouth tid His updated medication list for this problem includes:    Bystolic 10 Mg Tabs (Nebivolol hcl) .[name]... 1 by mouth qd  BP today: 138/88 Prior BP: 130/72 (01/26/2010)  Prior 10 Yr Risk Heart Disease: 4 %  (08/26/2008)  Labs Reviewed: K+: 4.5 (08/11/2009) Creat: : 1.1 (08/11/2009)   Chol: 171 (04/11/2007)   HDL: 50.5 (04/11/2007)   LDL: 101 (04/11/2007)   TG: 96 (04/11/2007)  Problem # 4:  TREMOR (ICD-781.0) Assessment: Unchanged  Problem # 5:  ANXIETY (ICD-300.00) Assessment: Deteriorated  His updated medication list for this problem includes:    Budeprion Sr 150 Mg Xr12h-tab (Bupropion hcl) .[name]... 1 by mouth bid    Alprazolam Xr 1 Mg Xr24h-tab (Alprazolam) .[name]... 1 by mouth qam    Hydroxyzine Hcl 50 Mg Tabs (Hydroxyzine hcl) .[name]... 1-2 by mouth by mouth two times a day as needed anxiety    Alprazolam 2 Mg Tabs (Alprazolam) .[name]... 1 by mouth three times a day as needed anxiety  Complete Medication List: 1)  Hydrocodone-acetaminophen 10-650 Mg Tabs (Hydrocodone-acetaminophen) .... Qid as needed 2)  Vitamin D3 1000 Unit Tabs (Cholecalciferol) .... 2 once daily by mouth 3)  Ibuprofen 400 Mg Tabs (Ibuprofen) .[name].. 1 by mouth qid as needed pain 4)  Budeprion Sr 150 Mg Xr12h-tab (Bupropion hcl) .[name].. 1 by mouth bid 5)  Savella 50 Mg Tabs (Milnacipran hcl) .[name].. 1 by mouth bid 6)  Alprazolam Xr 1 Mg Xr24h-tab (Alprazolam) .[name].. 1 by mouth qam 7)  Levsin 0.125 Mg Tabs (Hyoscyamine sulfate) .[name].. 1-2 by mouth 2 times daily as needed for abdominal pain 8)  Robinul-forte 2 Mg Tabs (Glycopyrrolate) .... One tablet by mouth two times a day 9)  Hydroxyzine Hcl 50 Mg Tabs (Hydroxyzine hcl) .[name].. 1-2 by mouth by mouth two times a day as needed anxiety 10)  Alprazolam 2 Mg Tabs (Alprazolam) .[name].. 1 by mouth three times a day as needed anxiety 11)  Acyclovir 400 Mg Tabs (Acyclovir) .[name].. 1 by mouth qid 12)  Lithium Carbonate 150 Mg Caps (Lithium carbonate) .[name].. 1 by mouth at bedtime x 1 wk, then 1 two times a day x 1 wk, then 1 in am and 2 at hs x 1 wk, then 2 by mouth bid 13)  Repliva 21/7  .... 1 by mouth once daily 14)  Bystolic 10 Mg Tabs (Nebivolol hcl) .[name].. 1 by mouth qd  Patient Instructions: 1)  Please  schedule a follow-up appointment in 6 wks 2)  BMP prior to visit, ICD-9: 3)  Hepatic Panel prior to visit, ICD-9: 4)  TSH prior to visit, ICD-9: 5)  CBC w/ Diff prior to visit, ICD-9: 6)  Lithium level 7)  Call if you are not better in a reasonable amount of time or if worse.  Prescriptions: BYSTOLIC 10 MG TABS (NEBIVOLOL HCL) 1 by mouth qd  #30 x 12   Entered and Authorized by:   Tresa Garter MD   Signed by:   Tresa Garter MD on 04/14/2010   Method used:   Print then Give to Patient   RxID:   [id number] LITHIUM CARBONATE 150 MG CAPS (LITHIUM CARBONATE) 1 by mouth at bedtime x 1 wk, then 1 two times a day x 1 wk, then 1 in am and 2 at hs x 1 wk, then 2 by mouth bid  #120 x 3   Entered and Authorized by:   Tresa Garter MD   Signed by:   Tresa Garter MD on 04/14/2010   Method used:   Print then Give to Patient   RxID:   [id number]

## 2010-10-29 NOTE — Progress Notes (Signed)
Summary: Welbutrin Appeal  Phone Note Call from Patient Call back at Work Phone [phone] Call back at [phone]   Summary of Call: Patient is requesting letter of medical necessity regarding generic welbutrin. The 300mg  dose caused increased trembling & "nervous" issues. The then was changed to 150mg  two times a day.   Fax letter to appeals department: [phone] [facility]. Initial call taken by: Lamar Sprinkles, CMA,  Feb 11, 2010 1:45 PM  Follow-up for Phone Call        Does he need a brand 150 XL? Follow-up by: Tresa Garter MD,  Feb 11, 2010 5:59 PM  Additional Follow-up for Phone Call Additional follow up Details #1::        Not brand, generic, the med in EMR list..........Marland KitchenLamar Sprinkles, CMA  Feb 11, 2010 6:20 PM     Additional Follow-up for Phone Call Additional follow up Details #2::    OK Follow-up by: Tresa Garter MD,  Feb 12, 2010 7:47 AM  Additional Follow-up for Phone Call Additional follow up Details #3:: Details for Additional Follow-up Action Taken: Faxed today Additional Follow-up by: Lamar Sprinkles, CMA,  Feb 16, 2010 9:40 AM

## 2010-10-29 NOTE — Assessment & Plan Note (Signed)
Summary: 3 MONTH FOLLOW UP-LB   Vital Signs:  Patient profile:   45 year old male Height:      68 inches Weight:      191.31 pounds BMI:     29.19 O2 Sat:      97 % on Room air Temp:     97.6 degrees F oral Pulse rate:   121 / minute BP sitting:   130 / 72  (left arm) Cuff size:   large  Vitals Entered By: Lucious Groves (Jan 26, 2010 2:40 PM)  O2 Flow:  Room air CC: 3 mo f/u--Per pt no symptoms or complaints./kb Is Patient Diabetic? No Pain Assessment Patient in pain? no        Primary Care Provider:  Sonda Primes, MD  CC:  3 mo f/u--Per pt no symptoms or complaints./kb.  History of Present Illness: The patient presents for a follow up of back pain, anxiety, depression and headaches. Diarrhea and anxiety is worse  Current Medications (verified): 1)  Propranolol Hcl 20 Mg  Tabs (Propranolol Hcl) .[name].. 1 By Mouth Tid 2)  Hydrocodone-Acetaminophen 10-650 Mg  Tabs (Hydrocodone-Acetaminophen) .... Qid As Needed 3)  Vitamin D3 1000 Unit  Tabs (Cholecalciferol) .... 2 Once Daily By Mouth 4)  Ibuprofen 400 Mg  Tabs (Ibuprofen) .[name].. 1 By Mouth Qid As Needed Pain 5)  Budeprion Sr 150 Mg  Xr12h-Tab (Bupropion Hcl) .[name].. 1 By Mouth Bid 6)  Savella 50 Mg  Tabs (Milnacipran Hcl) .[name].. 1 By Mouth Bid 7)  Alprazolam Xr 1 Mg Xr24h-Tab (Alprazolam) .[name].. 1 By Mouth Qam 8)  Levsin 0.125 Mg Tabs (Hyoscyamine Sulfate) .[name].. 1-2 By Mouth 2 Times Daily As Needed For Abdominal Pain 9)  Robinul-Forte 2 Mg Tabs (Glycopyrrolate) .... One Tablet By Mouth Two Times A Day 10)  Repliva 21/7 .... 1 By Mouth Once Daily 11)  Hydroxyzine Hcl 50 Mg Tabs (Hydroxyzine Hcl) .[name].. 1-2 By Mouth By Mouth Two Times A Day As Needed Anxiety 12)  Alprazolam 2 Mg Tabs (Alprazolam) .[name].. 1 By Mouth Three Times A Day As Needed Anxiety 13)  Acyclovir 400 Mg Tabs (Acyclovir) .[name].. 1 By Mouth Qid  Allergies (verified): 1)  ! Feldene 2)  Symbyax (Olanzapine-Fluoxetine Hcl) 3)  Buspar  Past History:  Past Medical  History: Last updated: 11/10/2009 Anxiety Panic attacks Depression Hyperthyroidism IBS-D Recurrent costochondritis Tremor Fatigue 2009 Heat stroke  Hypertension GERD Genital herpes 2009 H/o prostatitis  Past Surgical History: Last updated: 03/10/2009 Right jaw surgery   Family History: Last updated: 03/10/2009 Family History of CAD Male 1st degree relative <50 Family History Hypertension Family History of Heart Disease: Grandfather Family History of Breast Cancer: Grandmother Family History of Pancreatic Cancer:Grandmother Family History of Diabetes: Grandmother Family History of Colon Cancer:Maternal Grandmother  Social History: Last updated: 03/10/2009 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   Head:  Normocephalic and atraumatic without obvious abnormalities. No apparent alopecia or balding. Mouth:  Oral mucosa and oropharynx without lesions or exudates.  Teeth in good repair.   Impression & Recommendations:  Problem # 1:  PANIC DISORDER (ICD-300.01) Assessment Deteriorated Trial of Depakote Risks vs benefits and controversies of a long term depakote use were discussed.  His updated medication list for this problem includes:    Budeprion Sr 150 Mg Xr12h-tab (Bupropion hcl) .[name]... 1 by mouth bid    Alprazolam Xr  1 Mg Xr24h-tab (Alprazolam) .[name]... 1 by mouth qam    Hydroxyzine Hcl 50 Mg Tabs (Hydroxyzine hcl) .[name]... 1-2 by mouth by mouth two times a day as needed anxiety    Alprazolam 2 Mg Tabs (Alprazolam) .[name]... 1 by mouth three times a day as needed anxiety  Problem # 2:  DIARRHEA (ICD-787.91) Assessment: Deteriorated On prescription drug  therapy   Problem # 3:  IRRITABLE BOWEL SYNDROME (ICD-564.1) Assessment: Deteriorated On prescription drug  therapy  See "Patient Instructions".   Problem # 4:  TREMOR  (ICD-781.0) Assessment: Unchanged  Problem # 5:  LOW BACK PAIN (ICD-724.2) Assessment: Improved  His updated medication list for this problem includes:    Hydrocodone-acetaminophen 10-650 Mg Tabs (Hydrocodone-acetaminophen) ..... Qid as needed    Ibuprofen 400 Mg Tabs (Ibuprofen) .[name]... 1 by mouth qid as needed pain  Complete Medication List: 1)  Propranolol Hcl 20 Mg Tabs (Propranolol hcl) .[name].. 1 by mouth tid 2)  Hydrocodone-acetaminophen 10-650 Mg Tabs (Hydrocodone-acetaminophen) .... Qid as needed 3)  Vitamin D3 1000 Unit Tabs (Cholecalciferol) .... 2 once daily by mouth 4)  Ibuprofen 400 Mg Tabs (Ibuprofen) .[name].. 1 by mouth qid as needed pain 5)  Budeprion Sr 150 Mg Xr12h-tab (Bupropion hcl) .[name].. 1 by mouth bid 6)  Savella 50 Mg Tabs (Milnacipran hcl) .[name].. 1 by mouth bid 7)  Alprazolam Xr 1 Mg Xr24h-tab (Alprazolam) .[name].. 1 by mouth qam 8)  Levsin 0.125 Mg Tabs (Hyoscyamine sulfate) .[name].. 1-2 by mouth 2 times daily as needed for abdominal pain 9)  Robinul-forte 2 Mg Tabs (Glycopyrrolate) .... One tablet by mouth two times a day 10)  Repliva 21/7  .... 1 by mouth once daily 11)  Hydroxyzine Hcl 50 Mg Tabs (Hydroxyzine hcl) .[name].. 1-2 by mouth by mouth two times a day as needed anxiety 12)  Alprazolam 2 Mg Tabs (Alprazolam) .[name].. 1 by mouth three times a day as needed anxiety 13)  Acyclovir 400 Mg Tabs (Acyclovir) .[name].. 1 by mouth qid 14)  Depakote Er 250 Mg Xr24h-tab (Divalproex sodium) .[name].. 1 by mouth qd 15)  Welchol 3.75 Gm Pack (Colesevelam hcl) .[name].. 1 by mouth qd 16)  Depakote 125 Mg Tbec (Divalproex sodium) .[name].. 1 by mouth bid    Patient Instructions: 1)  Please schedule a follow-up appointment in 1-2 month. 2)  BMP prior to visit, ICD-9: 3)  depakote level 995.20 4)  Hepatic Panel prior to visit, ICD-9: 5)  Start Depakote 125 mg 1/2 two times a day x 2 wks then 1 by mouth two times a day. Start Depakote ER when finished. Prescriptions: DEPAKOTE 125 MG TBEC (DIVALPROEX SODIUM) 1  by mouth bid  #60 x 0   Entered and Authorized by:   Tresa Garter MD   Signed by:   Tresa Garter MD on 01/26/2010   Method used:   Print then Give to Patient   RxID:   [id number] WELCHOL 3.75 GM PACK (COLESEVELAM HCL) 1 by mouth qd  #30 x 12   Entered and Authorized by:   Tresa Garter MD   Signed by:   Tresa Garter MD on 01/26/2010   Method used:   Print then Give to Patient   RxID:   [id number] DEPAKOTE ER 250 MG XR24H-TAB (DIVALPROEX SODIUM) 1 by mouth qd  #30 x 3   Entered and Authorized by:   Tresa Garter MD   Signed by:   Tresa Garter MD on 01/26/2010   Method used:  Print then Give to Patient   RxID:   [id number]

## 2010-10-29 NOTE — Progress Notes (Signed)
Summary: Rf Alprazolam  Phone Note Refill Request Message from:  Fax from Pharmacy  Refills Requested: Medication #1:  ALPRAZOLAM XR 1 MG XR24H-TAB 1 by mouth qam [BMN]   Dosage confirmed as above?Dosage Confirmed   Supply Requested: 30  Medication #2:  ALPRAZOLAM 2 MG TABS 1 by mouth three times a day as needed anxiety   Dosage confirmed as above?Dosage Confirmed   Supply Requested: 90  Method Requested: Telephone to Pharmacy Next Appointment Scheduled: 05-26-10 Initial call taken by: Lanier Prude, [facility]),  May 13, 2010 11:52 AM  Follow-up for Phone Call        He needs one or the other Follow-up by: Tresa Garter MD,  May 13, 2010 1:10 PM  Additional Follow-up for Phone Call Additional follow up Details #1::        pt states he needs 2mg  1 three times a day. Additional Follow-up by: Lanier Prude, [facility]),  May 13, 2010 1:40 PM    Additional Follow-up for Phone Call Additional follow up Details #2::    OK 3 ref Follow-up by: Tresa Garter MD,  May 14, 2010 1:42 PM  Prescriptions: ALPRAZOLAM 2 MG TABS (ALPRAZOLAM) 1 by mouth three times a day as needed anxiety  #90 x 3   Entered by:   Lanier Prude, CMA(AAMA)   Authorized by:   Tresa Garter MD   Signed by:   Lanier Prude, CMA(AAMA) on 05/14/2010   Method used:   Telephoned to ...       [facility] [phone]* (retail)       [address]       [place], [place]  [address]       Ph: [phone] or [phone]       Fax: [phone]   RxID:   [id number]

## 2010-10-29 NOTE — Progress Notes (Signed)
Summary: Med ?  Phone Note From Pharmacy   Caller[facility] [address] [phone]* Summary of Call: rec fax from pharm for 1.))Wechol 3.75mg  packet. use 1 packet QD  and Glycopyrrolate 2mg  1 po bid # 60.I do not see  either on active med list. ok to RF? And 2.)) ins will only cover Bupropion XL 300mg  1 by mouth once daily.  Can we change?  Please advise Initial call taken by: Lanier Prude, [facility]),  August 14, 2010 11:43 AM  Follow-up for Phone Call        1. OK 6 ref on both 2. OK to switch Follow-up by: Tresa Garter MD,  August 14, 2010 1:33 PM    New/Updated Medications: WELCHOL 3.75 GM PACK (COLESEVELAM HCL) use 1 packet once daily GLYCOPYRROLATE 2 MG TABS (GLYCOPYRROLATE) 1 by mouth two times a day WELLBUTRIN XL 300 MG XR24H-TAB (BUPROPION HCL) 1 by mouth once daily Prescriptions: WELLBUTRIN XL 300 MG XR24H-TAB (BUPROPION HCL) 1 by mouth once daily  #30 x 5   Entered by:   Lanier Prude, CMA(AAMA)   Authorized by:   Tresa Garter MD   Signed by:   Lanier Prude, CMA(AAMA) on 08/14/2010   Method used:   Electronically to        [facility] [phone]* (retail)       [address]       [place], [place]  [address]       Ph: [phone] or [phone]       Fax: [phone]   RxID:   [id number] GLYCOPYRROLATE 2 MG TABS (GLYCOPYRROLATE) 1 by mouth two times a day  #60 x 5   Entered by:   Lanier Prude, CMA(AAMA)   Authorized by:   Tresa Garter MD   Signed by:   Lanier Prude, CMA(AAMA) on 08/14/2010   Method used:   [facility]ronically to        [facility] [phone]* (retail)       [address]       [place], [place]  [address]       Ph: [phone] or [phone]       Fax: [phone]   RxID:   [id number] WELCHOL 3.75 GM PACK (COLESEVELAM HCL) use 1 packet once daily  #30 x 5   Entered by:   Lanier Prude, CMA(AAMA)   Authorized by:   Tresa Garter MD   Signed by:   Lanier Prude, CMA(AAMA) on  08/14/2010   Method used:   [facility]ically to        [facility] [phone]* (retail)       [address]       [place], [place]  [address]       Ph: [phone] or [phone]       Fax: [phone]   RxID:   [id number]

## 2010-10-29 NOTE — Progress Notes (Signed)
Summary: Propranolol 90 day  Phone Note Refill Request Message from:  Fax from Pharmacy on Feb 06, 2010 11:40 AM  Refills Requested: Medication #1:  PROPRANOLOL HCL 20 MG  TABS 1 by mouth tid   Supply Requested: 90 Initial call taken by: Lucious Groves,  Feb 06, 2010 11:40 AM    Prescriptions: PROPRANOLOL HCL 20 MG  TABS (PROPRANOLOL HCL) 1 by mouth tid  #90 x 3   Entered by:   Lucious Groves   Authorized by:   Tresa Garter MD   Signed by:   Lucious Groves on 02/06/2010   Method used:   [facility]ronically to        [facility] [phone]* (retail)       [address]       [place], [place]  [address]       Ph: [phone] or [phone]       Fax: [phone]   RxID:   [id number]

## 2010-10-29 NOTE — Letter (Signed)
Summary: Generic Letter  [facility]  [address] [place], [place] [address]   Phone: [phone]  Fax: [phone]    02/12/2010  RE: Mark Mcgee [address] [place], [place]  [address]  It is medically necessary for Mark Mcgee to take Budeprion SR 150 mg two times a day due to tremor with higher one time dose.          Sincerely,   Jacinta Shoe MD

## 2010-10-29 NOTE — Progress Notes (Signed)
Summary: Blood in urine  Phone Note Call from Patient Call back at Work Phone [phone] Call back at [phone]   Summary of Call: Pt c/o bright red blood when urinating, LBP, pelvic pain - wants office visit today w/DR plot if possible. Initial call taken by: Lamar Sprinkles, CMA,  October 22, 2010 9:55 AM  Follow-up for Phone Call        Work in today with another MD pls Follow-up by: Tresa Garter MD,  October 22, 2010 12:42 PM  Additional Follow-up for Phone Call Additional follow up Details #1::        Being seen now - transported to ED Additional Follow-up by: Lamar Sprinkles, CMA,  October 22, 2010 1:56 PM    Additional Follow-up for Phone Call Additional follow up Details #2::    Noted. Thx Follow-up by: Tresa Garter MD,  October 23, 2010 1:28 PM

## 2010-10-29 NOTE — Letter (Signed)
Summary: Generic Letter  [facility]  [address] [place], [place] [address]   Phone: [phone]  Fax: [phone]    07/23/2010  GY:IRSW Bray [address] [place], [place]  [address]  Mr. Kisner had to cancel his trip [place]  to [facility] / [place] with conf. code [id number], ticket # [id number] due to a severe panic attack that happened right before he had to board the plane.           Sincerely,   Jacinta Shoe MD

## 2010-10-29 NOTE — Assessment & Plan Note (Signed)
Summary: blood in urine/SD   Vital Signs:  Patient profile:   45 year old male Height:      68 inches Weight:      220 pounds O2 Sat:      98 % on Room air Temp:     98.2 degrees F oral Pulse rate:   110 / minute Pulse rhythm:   regular Resp:     16 per minute BP sitting:   172 / 102  (left arm)  O2 Flow:  Room air  Primary Care Provider:  Sonda Primes, MD   History of Present Illness: He returns c/o 2 day hx. of right flank pain with hematuria, chills, dysuria, and dizziness. Hydrocodone has not helped his pain.  Preventive Screening-Counseling & Management  Alcohol-Tobacco     Alcohol drinks/day: 0     Alcohol Counseling: not indicated; patient does not drink     Smoking Status: never     Tobacco Counseling: not indicated; no tobacco use  Hep-HIV-STD-Contraception     Hepatitis Risk: no risk noted     HIV Risk: yes     STD Risk: no risk noted      Drug Use:  no.    Medications Prior to Update: 1)  Hydrocodone-Acetaminophen 10-650 Mg  Tabs (Hydrocodone-Acetaminophen) .... Qid As Needed 2)  Vitamin D3 1000 Unit  Tabs (Cholecalciferol) .... 2 Once Daily By Mouth 3)  Ibuprofen 400 Mg  Tabs (Ibuprofen) .[name].. 1 By Mouth Qid As Needed Pain 4)  Budeprion Sr 150 Mg  Xr12h-Tab (Bupropion Hcl) .[name].. 1 By Mouth Bid 5)  Levsin 0.125 Mg Tabs (Hyoscyamine Sulfate) .[name].. 1-2 By Mouth 2 Times Daily As Needed For Abdominal Pain 6)  Robinul-Forte 2 Mg Tabs (Glycopyrrolate) .... One Tablet By Mouth Two Times A Day 7)  Hydroxyzine Hcl 50 Mg Tabs (Hydroxyzine Hcl) .[name].. 1-2 By Mouth By Mouth Two Times A Day As Needed Anxiety 8)  Alprazolam 2 Mg Tabs (Alprazolam) .[name].. 1 By Mouth Three Times A Day As Needed Anxiety 9)  Acyclovir 400 Mg Tabs (Acyclovir) .[name].. 1 By Mouth Qid 10)  Repliva 21/7 .... 1 By Mouth Once Daily 11)  Bystolic 10 Mg Tabs (Nebivolol Hcl) .[name].. 1 By Mouth Qd 12)  Lithobid 300 Mg Cr-Tabs (Lithium Carbonate) .[name].. 1 By Mouth Bid 13)  Welchol 3.75 Gm Pack (Colesevelam Hcl)  .... Use 1 Packet Once Daily 14)  Glycopyrrolate 2 Mg Tabs (Glycopyrrolate) .[name].. 1 By Mouth Two Times A Day 15)  Wellbutrin Xl 300 Mg Xr24h-Tab (Bupropion Hcl) .[name].. 1 By Mouth Once Daily  Current Medications (verified): 1)  Hydrocodone-Acetaminophen 10-650 Mg  Tabs (Hydrocodone-Acetaminophen) .... Qid As Needed 2)  Vitamin D3 1000 Unit  Tabs (Cholecalciferol) .... 2 Once Daily By Mouth 3)  Ibuprofen 400 Mg  Tabs (Ibuprofen) .[name].. 1 By Mouth Qid As Needed Pain 4)  Budeprion Sr 150 Mg  Xr12h-Tab (Bupropion Hcl) .[name].. 1 By Mouth Bid 5)  Levsin 0.125 Mg Tabs (Hyoscyamine Sulfate) .[name].. 1-2 By Mouth 2 Times Daily As Needed For Abdominal Pain 6)  Robinul-Forte 2 Mg Tabs (Glycopyrrolate) .... One Tablet By Mouth Two Times A Day 7)  Hydroxyzine Hcl 50 Mg Tabs (Hydroxyzine Hcl) .[name].. 1-2 By Mouth By Mouth Two Times A Day As Needed Anxiety 8)  Alprazolam 2 Mg Tabs (Alprazolam) .[name].. 1 By Mouth Three Times A Day As Needed Anxiety 9)  Acyclovir 400 Mg Tabs (Acyclovir) .[name].. 1 By Mouth Qid 10)  Repliva 21/7 .... 1 By Mouth Once Daily  11)  Bystolic 10 Mg Tabs (Nebivolol Hcl) .[name].. 1 By Mouth Qd 12)  Lithobid 300 Mg Cr-Tabs (Lithium Carbonate) .[name].. 1 By Mouth Bid 13)  Welchol 3.75 Gm Pack (Colesevelam Hcl) .... Use 1 Packet Once Daily 14)  Glycopyrrolate 2 Mg Tabs (Glycopyrrolate) .[name].. 1 By Mouth Two Times A Day 15)  Wellbutrin Xl 300 Mg Xr24h-Tab (Bupropion Hcl) .[name].. 1 By Mouth Once Daily  Allergies (verified): 1)  ! Feldene 2)  Symbyax (Olanzapine-Fluoxetine Hcl) 3)  Buspar 4)  Depakote  Past History:  Past Medical History: Last updated: 11/10/2009 Anxiety Panic attacks Depression Hyperthyroidism IBS-D Recurrent costochondritis Tremor Fatigue 2009 Heat stroke  Hypertension GERD Genital herpes 2009 H/o prostatitis  Past Surgical History: Last updated: 03/10/2009 Right jaw surgery   Family History: Last updated: 03/10/2009 Family History of CAD Male 1st degree relative <50 Family  History Hypertension Family History of Heart Disease: Grandfather Family History of Breast Cancer: Grandmother Family History of Pancreatic Cancer:Grandmother Family History of Diabetes: Grandmother Family History of Colon Cancer:Maternal Grandmother  Social History: Last updated: 03/10/2009 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 Family History of CAD Male 1st degree relative <50 Family History Hypertension Family History of Heart Disease: Grandfather Family History of Breast Cancer: Grandmother Family History of Pancreatic Cancer:Grandmother Family History of Diabetes: Grandmother Family History of Colon Cancer:Maternal Grandmother  Social History: Reviewed history from 03/10/2009 and no changes required. 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  The patient denies anorexia, fever, weight loss, chest pain, syncope, peripheral edema, prolonged cough, headaches, hemoptysis, melena, hematochezia, severe indigestion/heartburn, genital sores, and suspicious skin lesions.    Physical Exam  General:  pale, diaphoretic, and severe distress.  in a wheelchair.  Head:  normocephalic and atraumatic.   Mouth:  Oral mucosa and oropharynx without lesions or exudates.  Teeth in good repair. Neck:  No deformities, masses, or tenderness noted. Lungs:  Normal respiratory effort, chest expands symmetrically. Lungs are clear to auscultation, no crackles or wheezes. Heart:  Normal rate and regular rhythm. S1 and S2 normal  without gallop, murmur, click, rub or other extra sounds. Abdomen:  soft, non-tender, no distention, no masses, no guarding, no rigidity, no rebound tenderness, no abdominal hernia, no inguinal hernia, no hepatomegaly, no splenomegaly, and bowel sounds hypoactive.   Genitalia:  circumcised, no hydrocele, no varicocele, no scrotal masses, no testicular masses or atrophy, no cutaneous lesions, and no urethral discharge.  + gross blood from meatus. Msk:  No deformity or scoliosis noted of thoracic or lumbar spine.   Extremities:  No clubbing, cyanosis, edema, or deformity noted with normal full range of motion of all joints.   Skin:  turgor normal, color normal, no rashes, no suspicious lesions, no ecchymoses, no petechiae, no purpura, no ulcerations, and no edema.   Psych:  Oriented X3, memory intact for recent and remote, normally interactive, good eye contact, not depressed appearing, and moderately anxious.     Impression & Recommendations:  Problem # 1:  RENAL COLIC (ICD-788.0) this looks like it  is a large stone with obstruction +/- sepsis, hydronephrosis, renal failure, hemorrhage. I have recommended that he go to the ER for IV fiulds, pain management, labs, CT scan, etc. he agrees and will be transferred to [facility]  Complete Medication List: 1)  Hydrocodone-acetaminophen 10-650 Mg Tabs (Hydrocodone-acetaminophen) .... Qid as needed 2)  Vitamin D3 1000 Unit Tabs (Cholecalciferol) .... 2 once daily by mouth 3)  Ibuprofen 400 Mg Tabs (Ibuprofen) .[name].. 1 by mouth qid as needed pain 4)  Budeprion Sr 150 Mg Xr12h-tab (Bupropion hcl) .[name].. 1 by mouth bid 5)  Levsin 0.125 Mg Tabs (Hyoscyamine sulfate) .[name].. 1-2 by mouth 2 times daily as needed for abdominal pain 6)  Robinul-forte 2 Mg Tabs (Glycopyrrolate) .... One tablet by mouth two times a day 7)  Hydroxyzine Hcl 50 Mg Tabs (Hydroxyzine hcl) .[name].. 1-2 by mouth by mouth two times a day as needed anxiety 8)  Alprazolam 2 Mg Tabs (Alprazolam) .[name]..  1 by mouth three times a day as needed anxiety 9)  Acyclovir 400 Mg Tabs (Acyclovir) .[name].. 1 by mouth qid 10)  Repliva 21/7  .... 1 by mouth once daily 11)  Bystolic 10 Mg Tabs (Nebivolol hcl) .[name].. 1 by mouth qd 12)  Lithobid 300 Mg Cr-tabs (Lithium carbonate) .[name].. 1 by mouth bid 13)  Welchol 3.75 Gm Pack (Colesevelam hcl) .... Use 1 packet once daily 14)  Glycopyrrolate 2 Mg Tabs (Glycopyrrolate) .[name].. 1 by mouth two times a day 15)  Wellbutrin Xl 300 Mg Xr24h-tab (Bupropion hcl) .[name].. 1 by mouth once daily   Orders Added: 1)  Est. Patient Level III [[address]]

## 2010-10-29 NOTE — Progress Notes (Signed)
Summary: Lithium Rf  Phone Note From Pharmacy   Summary of Call: pharm requesting new rx for Lithium carb 150mg  90 day supply.  Please advise on sig/quantity Initial call taken by: Lanier Prude, [facility]),  June 08, 2010 11:06 AM  Follow-up for Phone Call        ok Follow-up by: Tresa Garter MD,  June 08, 2010 4:44 PM    New/Updated Medications: LITHIUM CARBONATE 150 MG CAPS (LITHIUM CARBONATE) 2 by mouth bid Prescriptions: LITHIUM CARBONATE 150 MG CAPS (LITHIUM CARBONATE) 2 by mouth bid  #360 x 1   Entered and Authorized by:   Tresa Garter MD   Signed by:   Tresa Garter MD on 06/08/2010   Method used:   Electronically to        [facility]  [address] [phone]* (retail)       [address]       [place], [place]  [address]       Ph: [phone] or [phone]       Fax: [phone]   RxID:   [id number]

## 2010-10-29 NOTE — Assessment & Plan Note (Signed)
Summary: 6 WK FU---STC  Mark Mcgee   Vital Signs:  Patient profile:   45 year old male Height:      68 inches Weight:      218 pounds BMI:     33.27 Temp:     98.5 degrees F oral Pulse rate:   84 / minute Pulse rhythm:   regular Resp:     16 per minute BP sitting:   140 / 88  (left arm) Cuff size:   regular  Vitals Entered By: Lanier Prude, CMA(AAMA) (June 22, 2010 8:12 AM) CC: 6 wk f/u Is Patient Diabetic? No   Primary Care Provider:  Sonda Primes, MD  CC:  6 wk f/u.  History of Present Illness: The patient presents for a follow up of  anxiety, depression, HTN. He had  headaches on higher dose Lithium. Overall a little better. He is planning to see a psychologist from work.  Preventive Screening-Counseling & Management  Alcohol-Tobacco     Smoking Status: never  Current Medications (verified): 1)  Hydrocodone-Acetaminophen 10-650 Mg  Tabs (Hydrocodone-Acetaminophen) .... Qid As Needed 2)  Vitamin D3 1000 Unit  Tabs (Cholecalciferol) .... 2 Once Daily By Mouth 3)  Ibuprofen 400 Mg  Tabs (Ibuprofen) .[name].. 1 By Mouth Qid As Needed Pain 4)  Budeprion Sr 150 Mg  Xr12h-Tab (Bupropion Hcl) .[name].. 1 By Mouth Bid 5)  Savella 50 Mg  Tabs (Milnacipran Hcl) .[name].. 1 By Mouth Bid 6)  Levsin 0.125 Mg Tabs (Hyoscyamine Sulfate) .[name].. 1-2 By Mouth 2 Times Daily As Needed For Abdominal Pain 7)  Robinul-Forte 2 Mg Tabs (Glycopyrrolate) .... One Tablet By Mouth Two Times A Day 8)  Hydroxyzine Hcl 50 Mg Tabs (Hydroxyzine Hcl) .[name].. 1-2 By Mouth By Mouth Two Times A Day As Needed Anxiety 9)  Alprazolam 2 Mg Tabs (Alprazolam) .[name].. 1 By Mouth Three Times A Day As Needed Anxiety 10)  Acyclovir 400 Mg Tabs (Acyclovir) .[name].. 1 By Mouth Qid 11)  Lithium Carbonate 150 Mg Caps (Lithium Carbonate) .... 2 By Mouth Bid 12)  Repliva 21/7 .... 1 By Mouth Once Daily 13)  Bystolic 10 Mg Tabs (Nebivolol Hcl) .[name].. 1 By Mouth Qd  Allergies (verified): 1)  ! 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  The patient denies fever and abdominal pain.    Physical Exam  General:  In no distress, overweight-appearing.   Head:  Normocephalic and atraumatic without obvious abnormalities. No apparent alopecia or balding. Eyes:  No corneal or conjunctival inflammation noted. EOMI. Perrla.  Nose:  External nasal examination shows no deformity or inflammation. Nasal mucosa are pink and moist without lesions or exudates. Mouth:  Oral mucosa and oropharynx without lesions or exudates.  Teeth in good repair. Neck:  No deformities, masses, or tenderness noted. Lungs:  Normal respiratory effort, chest expands symmetrically. Lungs are clear to auscultation, no crackles or wheezes. Heart:  Normal rate and regular rhythm. S1 and S2 normal without gallop, murmur, click, rub or  other extra sounds. Abdomen:  Bowel sounds positive,abdomen soft and non-tender without masses, organomegaly or hernias noted. Msk:  No deformity or scoliosis noted of thoracic or lumbar spine.   Extremities:  No clubbing, cyanosis, edema, or deformity noted with normal full range of motion of all joints.   Neurologic:  alert & oriented X3 and gait normal.  Tremor in B hands is present Skin:  WNL Psych:  Not suisidal Oriented X3, not homicidal, depressed affect, subdued,  tearful, less severely anxious, poor concentration, and judgment fair.     Impression &  Recommendations:  Problem # 1:  PANIC DISORDER (ICD-300.01) Assessment Improved  His updated medication list for this problem includes:    Budeprion Sr 150 Mg Xr12h-tab (Bupropion hcl) .[name]... 1 by mouth bid    Hydroxyzine Hcl 50 Mg Tabs (Hydroxyzine hcl) .[name]... 1-2 by mouth by mouth two times a day as needed anxiety    Alprazolam 2 Mg Tabs (Alprazolam) .[name]... 1 by mouth three times a day as needed anxiety  Orders: T-Lithium Level (91478-29562) TLB-TSH (Thyroid Stimulating Hormone) (84443-TSH) TLB-BMP (Basic Metabolic Panel-BMET) (80048-METABOL) TLB-CBC Platelet - w/Differential (85025-CBCD) TLB-Hepatic/Liver Function Pnl (80076-HEPATIC)  Problem # 2:  IRRITABLE BOWEL SYNDROME (ICD-564.1) Assessment: Unchanged  Orders: T-Lithium Level (13086-57846) TLB-TSH (Thyroid Stimulating Hormone) (84443-TSH) TLB-BMP (Basic Metabolic Panel-BMET) (80048-METABOL) TLB-CBC Platelet - w/Differential (85025-CBCD) TLB-Hepatic/Liver Function Pnl (80076-HEPATIC)  Problem # 3:  TREMOR (ICD-781.0) Assessment: Improved  Orders: T-Lithium Level (96295-28413) TLB-TSH (Thyroid Stimulating Hormone) (84443-TSH) TLB-BMP (Basic Metabolic Panel-BMET) (80048-METABOL) TLB-CBC Platelet - w/Differential (85025-CBCD) TLB-Hepatic/Liver Function Pnl (80076-HEPATIC)  Problem # 4:  DEPRESSION (ICD-311) Assessment: Unchanged  His updated medication list for this problem includes:    Budeprion Sr 150 Mg Xr12h-tab (Bupropion hcl) .[name]... 1 by mouth bid    Hydroxyzine Hcl 50 Mg Tabs (Hydroxyzine hcl) .[name]... 1-2 by mouth by mouth two times a day as needed anxiety    Alprazolam 2 Mg Tabs (Alprazolam) .[name]... 1 by mouth three times a day as needed anxiety  Complete Medication List: 1)  Hydrocodone-acetaminophen 10-650 Mg Tabs (Hydrocodone-acetaminophen) .... Qid as needed 2)  Vitamin D3 1000 Unit Tabs (Cholecalciferol) .... 2 once daily by mouth 3)  Ibuprofen 400 Mg Tabs (Ibuprofen) .[name].. 1 by mouth qid as needed pain 4)   Budeprion Sr 150 Mg Xr12h-tab (Bupropion hcl) .[name].. 1 by mouth bid 5)  Savella 50 Mg Tabs (Milnacipran hcl) .[name].. 1 by mouth bid 6)  Levsin 0.125 Mg Tabs (Hyoscyamine sulfate) .[name].. 1-2 by mouth 2 times daily as needed for abdominal pain 7)  Robinul-forte 2 Mg Tabs (Glycopyrrolate) .... One tablet by mouth two times a day 8)  Hydroxyzine Hcl 50 Mg Tabs (Hydroxyzine hcl) .[name].. 1-2 by mouth by mouth two times a day as needed anxiety 9)  Alprazolam 2 Mg Tabs (Alprazolam) .[name].. 1 by mouth three times a day as needed anxiety 10)  Acyclovir 400 Mg Tabs (Acyclovir) .[name].. 1 by mouth qid 11)  Repliva 21/7  .... 1 by mouth once daily 12)  Bystolic 10 Mg Tabs (Nebivolol hcl) .[name].. 1 by mouth qd 13)  Lithobid 300 Mg Cr-tabs (Lithium carbonate) .[name].. 1 by mouth bid  Other Orders: Admin 1st Vaccine (24401) Flu Vaccine 38yrs + (02725)  Patient Instructions: 1)  Please schedule a follow-up appointment in 2 months. Prescriptions: LITHOBID 300 MG CR-TABS (LITHIUM CARBONATE) 1 by mouth bid  #60 x 6   Entered and Authorized by:   Tresa Garter MD   Signed by:   Georgina Quint Plotnikov  MD on 06/22/2010   Method used:   Print then Give to Patient   RxID:   [id number]   .lbflu   Flu Vaccine Consent Questions     Do you have a history of severe allergic reactions to this vaccine? no    Any prior history of allergic reactions to egg and/or gelatin? no    Do you have a sensitivity to the preservative Thimersol? no    Do you have a past history of Guillan-Barre Syndrome? no    Do you currently have an acute febrile illness? no    Have you ever had a severe reaction to latex? no    Vaccine information given and explained to patient? yes    Are you currently pregnant? no    Lot Number:[id number]   Exp Date:03/27/2011   Site Given  Right Deltoid IM Lanier Prude, [facility])  June 22, 2010 8:53 AM

## 2010-10-29 NOTE — Assessment & Plan Note (Signed)
Summary: Mark Mcgee   Vital Signs:  Patient profile:   45 year old male Weight:      190 pounds Temp:     97.4 degrees F oral Pulse rate:   106 / minute BP sitting:   142 / 82  (left arm)  Vitals Entered By: Tora Perches (November 10, 2009 2:34 PM) CC: f/u Is Patient Diabetic? No   Primary Care Provider:  Sonda Primes, MD  CC:  f/u.  History of Present Illness: The patient presents for a follow up of back pain, anxiety, depression and IBS - all bad C/o insomnia C/o pain in pubic area and B thighs like w/prostate inection before 1 mg alprazolam helps less  Preventive Screening-Counseling & Management  Alcohol-Tobacco     Smoking Status: never  Current Medications (verified): 1)  Propranolol Hcl 20 Mg  Tabs (Propranolol Hcl) .[name].. 1 By Mouth Tid 2)  Vistaril 25 Mg  Caps (Hydroxyzine Pamoate) .[name].. 1-2 At [facility] Pr[facility] Mark)  Hydrocodone-Acetaminophen 10-650 Mg  Tabs (Hydrocodone-Acetaminophen) .... Qid As Needed 4)  Vitamin D3 1000 Unit  Tabs (Cholecalciferol) .... 2 Once Daily By Mouth 5)  Alprazolam 1 Mg  Tabs (Alprazolam) .[name].. 1-2  Two Times A Day As Needed 6)  Ibuprofen 400 Mg  Tabs (Ibuprofen) .[name].. 1 By Mouth Qid As Needed Pain 7)  Budeprion Sr 150 Mg  Xr12h-Tab (Bupropion Hcl) .[name].. 1 By Mouth Bid 8)  Savella 50 Mg  Tabs (Milnacipran Hcl) .[name].. 1 By Mouth Bid 9)  Alprazolam Xr 1 Mg Xr24h-Tab (Alprazolam) .[name].. 1 By Mouth Qam 10)  Levsin 0.125 Mg Tabs (Hyoscyamine Sulfate) .[name].. 1-2 By Mouth 2 Times Daily As Needed For Abdominal Pain 11)  Robinul-Forte 2 Mg Tabs (Glycopyrrolate) .... One Tablet By Mouth Two Times A Day 12)  Repliva 21/7 .... 1 By Mouth Once Daily  Allergies: 1)  ! 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  The patient denies weight loss, weight gain, chest pain, syncope, dyspnea on exertion, and genital sores.    Physical Exam  General:  NAD, overweight-appearing.   Nose:  External nasal examination shows no deformity or inflammation. Nasal mucosa are pink and moist without lesions or exudates. Mouth:  Oral mucosa and oropharynx without lesions or exudates.  Teeth in good repair. Lungs:  Normal respiratory effort, chest expands symmetrically. Lungs are clear to auscultation, no crackles or wheezes. Heart:  Normal rate and regular rhythm. S1 and S2 normal without gallop, murmur, click, rub or other extra sounds. Abdomen:  Bowel sounds positive,abdomen soft and non-tender without masses, organomegaly or hernias noted. Msk:  No deformity or scoliosis noted of thoracic or lumbar spine.   Extremities:  No clubbing, cyanosis, edema, or deformity noted with normal full range of motion of all joints.   Neurologic:  alert & oriented X3 and gait normal.  Tremor in B hands Skin:  WNL Psych:  Not suisidal Oriented X3, not homicidal, depressed affect, subdued, not  tearful, less severely anxious, poor concentration, and judgment fair.     Impression &  Recommendations:  Problem # 1:  HYPERTENSION (ICD-401.1) Assessment Unchanged  His updated medication list for this problem includes:    Propranolol Hcl 20 Mg Tabs (Propranolol hcl) .[name]... 1 by mouth tid  Problem # 2:  DYSURIA (ICD-788.1) poss prostatitis Assessment: Deteriorated  His updated medication list for this problem includes:    Ciprofloxacin Hcl 500 Mg Tabs (Ciprofloxacin hcl) .[name]... 1 by mouth bid  Problem # Mark:  PANIC DISORDER (ICD-300.01) Assessment: Unchanged  The following  medications were removed from the medication list:    Vistaril 25 Mg Caps (Hydroxyzine pamoate) .[name]... 1-2 at hs prn    Alprazolam 1 Mg Tabs (Alprazolam) .[name]... 1-2  two times a day as needed His updated medication list for this problem includes:    Budeprion Sr 150 Mg Xr12h-tab (Bupropion hcl) .[name]... 1 by mouth bid    Alprazolam Xr 1 Mg Xr24h-tab (Alprazolam) .[name]... 1 by mouth qam    Hydroxyzine Hcl 50 Mg Tabs (Hydroxyzine hcl) .[name]... 1-2 by mouth by mouth two times a day as needed anxiety    Alprazolam 2 Mg Tabs (Alprazolam) .[name]... 1 by mouth three times a day as needed anxiety Risks vs benefits and controversies of a long term controlled substances use were discussed.   Problem # 4:  IRRITABLE BOWEL SYNDROME (ICD-564.1) Assessment: Unchanged On prescription therapy   Problem # 5:  DYSURIA (ICD-788.1) Assessment: New  His updated medication list for this problem includes:    Ciprofloxacin Hcl 500 Mg Tabs (Ciprofloxacin hcl) .[name]... 1 by mouth bid  Problem # 6:  PROSTATITIS, ACUTE (ICD-601.0) Assessment: New as above  Complete Medication List: 1)  Propranolol Hcl 20 Mg Tabs (Propranolol hcl) .[name].. 1 by mouth tid 2)  Hydrocodone-acetaminophen 10-650 Mg Tabs (Hydrocodone-acetaminophen) .... Qid as needed Mark)  Vitamin D3 1000 Unit Tabs (Cholecalciferol) .... 2 once daily by mouth 4)  Ibuprofen 400 Mg Tabs (Ibuprofen) .[name].. 1 by mouth qid as needed pain 5)  Budeprion Sr 150 Mg Xr12h-tab (Bupropion hcl) .[name].. 1 by mouth bid 6)  Savella 50 Mg Tabs (Milnacipran hcl) .[name].. 1 by mouth bid 7)  Alprazolam Xr 1 Mg Xr24h-tab (Alprazolam) .[name].. 1 by mouth qam 8)  Levsin 0.125 Mg Tabs (Hyoscyamine sulfate) .[name].. 1-2 by mouth 2 times daily as needed for abdominal pain 9)  Robinul-forte 2 Mg Tabs (Glycopyrrolate) .... One tablet by mouth two times a day 10)  Repliva 21/7  .... 1 by mouth once daily 11)  Hydroxyzine Hcl 50 Mg Tabs (Hydroxyzine hcl) .[name].. 1-2 by mouth by mouth two times a day as needed anxiety 12)   Alprazolam 2 Mg Tabs (Alprazolam) .[name].. 1 by mouth three times a day as needed anxiety 13)  Ciprofloxacin Hcl 500 Mg Tabs (Ciprofloxacin hcl) .[name].. 1 by mouth bid 14)  Acyclovir 400 Mg Tabs (Acyclovir) .[name].. 1 by mouth qid  Other Orders: TLB-Udip ONLY (81003-UDIP) T-HIV Ab Confirmatory Test/Western Blot ([id number]) T-Syphilis Test (RPR) (96295-28413) TLB-CBC Platelet - w/Differential (85025-CBCD)  Patient Instructions: 1)  Please schedule a follow-up appointment in Mark months. 2)  Call if you are not better in a reasonable amount of time or if worse. Go to ER if feeling really bad!  Prescriptions: ACYCLOVIR 400 MG TABS (ACYCLOVIR) 1 by mouth qid  #28 x 1   Entered and Authorized by:   Tresa Garter MD   Signed by:   Tresa Garter MD on 11/10/2009   Method used:   Print then Give to Patient   RxID:  [id number] CIPROFLOXACIN HCL 500 MG TABS (CIPROFLOXACIN HCL) 1 by mouth bid  #40 x 1   Entered and Authorized by:   Tresa Garter MD   Signed by:   Tresa Garter MD on 11/10/2009   Method used:   Print then Give to Patient   RxID:   [id number] ALPRAZOLAM 2 MG TABS (ALPRAZOLAM) 1 by mouth three times a day as needed anxiety  #90 x 6   Entered and Authorized by:   Tresa Garter MD   Signed by:   Tresa Garter MD on 11/10/2009   Method used:   Print then Give to Patient   RxID:   [id number] ROBINUL-FORTE 2 MG TABS (GLYCOPYRROLATE) one tablet by mouth two times a day  #60 x 11   Entered and Authorized by:   Tresa Garter MD   Signed by:   Tresa Garter MD on 11/10/2009   Method used:   Print then Give to Patient   RxID:   [id number] LEVSIN 0.125 MG TABS (HYOSCYAMINE SULFATE) 1-2 by mouth 2 times daily as needed for abdominal pain  #120 x Mark   Entered and Authorized by:   Tresa Garter MD   Signed by:   Tresa Garter MD on 11/10/2009   Method used:   Print then Give to Patient   RxID:    [id number] SAVELLA 50 MG  TABS (MILNACIPRAN HCL) 1 by mouth bid  #60 x 6   Entered and Authorized by:   Tresa Garter MD   Signed by:   Tresa Garter MD on 11/10/2009   Method used:   Print then Give to Patient   RxID:   [id number] BUDEPRION SR 150 MG  XR12H-TAB (BUPROPION HCL) 1 by mouth bid  #60 x 6   Entered and Authorized by:   Tresa Garter MD   Signed by:   Tresa Garter MD on 11/10/2009   Method used:   Print then Give to Patient   RxID:   [id number] PROPRANOLOL HCL 20 MG  TABS (PROPRANOLOL HCL) 1 by mouth tid  #30 x 12   Entered and Authorized by:   Tresa Garter MD   Signed by:   Tresa Garter MD on 11/10/2009   Method used:   Print then Give to Patient   RxID:   [id number] HYDROCODONE-ACETAMINOPHEN 10-650 MG  TABS (HYDROCODONE-ACETAMINOPHEN) qid as needed  #120 x 2   Entered and Authorized by:   Tresa Garter MD   Signed by:   Tresa Garter MD on 11/10/2009   Method used:   Print then Give to Patient   RxID:   [id number] HYDROXYZINE HCL 50 MG TABS (HYDROXYZINE HCL) 1-2 by mouth by mouth two times a day as needed anxiety  #120 x Mark   Entered and Authorized by:   Tresa Garter MD   Signed by:   Tresa Garter MD on 11/10/2009   Method used:   Print then Give to Patient   RxID:   [id number]

## 2010-10-29 NOTE — Assessment & Plan Note (Signed)
Summary: 2 mos f/u #cd   Vital Signs:  Patient profile:   45 year old male Height:      68 inches Weight:      219 pounds BMI:     33.42 Temp:     98.5 degrees F oral Pulse rate:   80 / minute Pulse rhythm:   regular Resp:     16 per minute BP sitting:   148 / 90  (left arm) Cuff size:   regular  Vitals Entered By: Lanier Prude, Beverly Gust) (July 21, 2010 3:27 PM) CC: 2 mo f/u Is Patient Diabetic? No   Primary Care Provider:  Sonda Primes, MD  CC:  2 mo f/u.  History of Present Illness: F/u anxiety, depression - worse He had a bad panic attack recentely  -  he could not board the plane to go to [place], cancelled the trip. C/o diarrhea, wt gain, shaky  Current Medications (verified): 1)  Hydrocodone-Acetaminophen 10-650 Mg  Tabs (Hydrocodone-Acetaminophen) .... Qid As Needed 2)  Vitamin D3 1000 Unit  Tabs (Cholecalciferol) .... 2 Once Daily By Mouth 3)  Ibuprofen 400 Mg  Tabs (Ibuprofen) .[name].. 1 By Mouth Qid As Needed Pain 4)  Budeprion Sr 150 Mg  Xr12h-Tab (Bupropion Hcl) .[name].. 1 By Mouth Bid 5)  Savella 50 Mg  Tabs (Milnacipran Hcl) .[name].. 1 By Mouth Bid 6)  Levsin 0.125 Mg Tabs (Hyoscyamine Sulfate) .[name].. 1-2 By Mouth 2 Times Daily As Needed For Abdominal Pain 7)  Robinul-Forte 2 Mg Tabs (Glycopyrrolate) .... One Tablet By Mouth Two Times A Day 8)  Hydroxyzine Hcl 50 Mg Tabs (Hydroxyzine Hcl) .[name].. 1-2 By Mouth By Mouth Two Times A Day As Needed Anxiety 9)  Alprazolam 2 Mg Tabs (Alprazolam) .[name].. 1 By Mouth Three Times A Day As Needed Anxiety 10)  Acyclovir 400 Mg Tabs (Acyclovir) .[name].. 1 By Mouth Qid 11)  Repliva 21/7 .... 1 By Mouth Once Daily 12)  Bystolic 10 Mg Tabs (Nebivolol Hcl) .[name].. 1 By Mouth Qd 13)  Lithobid 300 Mg Cr-Tabs (Lithium Carbonate) .[name].. 1 By Mouth Bid  Allergies (verified): 1)  ! Feldene 2)  Symbyax (Olanzapine-Fluoxetine Hcl) 3)  Buspar 4)  Depakote  Past History:  Past Medical History: Last updated: 11/10/2009 Anxiety Panic  attacks Depression Hyperthyroidism IBS-D Recurrent costochondritis Tremor Fatigue 2009 Heat stroke  Hypertension GERD Genital herpes 2009 H/o prostatitis  Past Surgical History: Last updated: 03/10/2009 Right jaw surgery   Family History: Last updated: 03/10/2009 Family History of CAD Male 1st degree relative <50 Family History Hypertension Family History of Heart Disease: Grandfather Family History of Breast Cancer: Grandmother Family History of Pancreatic Cancer:Grandmother Family History of Diabetes: Grandmother Family History of Colon Cancer:Maternal Grandmother  Social History: Last updated: 03/10/2009 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  The patient denies chest pain, syncope, and abdominal pain.    Physical Exam  General:  In no distress, overweight-appearing.   Nose:  External nasal examination shows no deformity or inflammation. Nasal mucosa are pink and moist without lesions or exudates. Mouth:  Oral mucosa and oropharynx without lesions or exudates.  Teeth in good repair. Neck:  No deformities, masses, or tenderness noted. Lungs:  Normal respiratory effort, chest expands symmetrically. Lungs are clear to auscultation, no crackles or wheezes. Heart:  Normal rate and regular rhythm. S1 and  S2 normal without gallop, murmur, click, rub or other extra sounds. Abdomen:  Bowel sounds positive,abdomen soft and non-tender without masses, organomegaly or hernias noted. Msk:  No deformity or scoliosis noted of thoracic or lumbar spine.   Neurologic:  alert & oriented X3 and gait normal.  Tremor in B hands is present Skin:  WNL, scratch skin ove inner thumb base B Psych:  Not suisidal Oriented X3, not homicidal, depressed affect, subdued, less tearful, less severely anxious, poor concentration, and judgment fair.      Impression & Recommendations:  Problem # 1:  PANIC DISORDER (ICD-300.01) Assessment Deteriorated FMLA filled out. Letter to airline [employer]. His updated medication list for this problem includes:    Budeprion Sr 150 Mg Xr12h-tab (Bupropion hcl) .[name]... 1 by mouth bid    Hydroxyzine Hcl 50 Mg Tabs (Hydroxyzine hcl) .[name]... 1-2 by mouth by mouth two times a day as needed anxiety    Alprazolam 2 Mg Tabs (Alprazolam) .[name]... 1 by mouth three times a day as needed anxiety  Orders: Psychiatric Referral (Psych)  Problem # 2:  ANXIETY (ICD-300.00) Assessment: Deteriorated  His updated medication list for this problem includes:    Budeprion Sr 150 Mg Xr12h-tab (Bupropion hcl) .[name]... 1 by mouth bid    Hydroxyzine Hcl 50 Mg Tabs (Hydroxyzine hcl) .[name]... 1-2 by mouth by mouth two times a day as needed anxiety    Alprazolam 2 Mg Tabs (Alprazolam) .[name]... 1 by mouth three times a day as needed anxiety  Problem # 3:  DIARRHEA (ICD-787.91) - IBS Assessment: Deteriorated On the regimen of medicine(s) reflected in the chart    Problem # 4:  TREMOR (ICD-781.0) Assessment: Unchanged  Complete Medication List: 1)  Hydrocodone-acetaminophen 10-650 Mg Tabs (Hydrocodone-acetaminophen) .... Qid as needed 2)  Vitamin D3 1000 Unit Tabs (Cholecalciferol) .... 2 once daily by mouth 3)  Ibuprofen 400 Mg Tabs (Ibuprofen) .[name].. 1 by mouth qid as needed pain 4)  Budeprion Sr 150 Mg Xr12h-tab (Bupropion hcl) .[name].. 1 by mouth bid 5)  Levsin 0.125 Mg Tabs (Hyoscyamine sulfate) .[name].. 1-2 by mouth 2 times daily as needed for abdominal pain 6)  Robinul-forte 2 Mg Tabs (Glycopyrrolate) .... One tablet by mouth two times a day 7)  Hydroxyzine Hcl 50 Mg Tabs (Hydroxyzine hcl) .[name].. 1-2 by mouth by mouth two times a day as needed anxiety 8)  Alprazolam 2 Mg Tabs (Alprazolam) .[name].. 1 by mouth three times a day as needed anxiety 9)  Acyclovir 400 Mg Tabs (Acyclovir) .[name].. 1 by mouth qid 10)  Repliva 21/7  .... 1 by mouth once daily 11)   Bystolic 10 Mg Tabs (Nebivolol hcl) .[name].. 1 by mouth qd 12)  Lithobid 300 Mg Cr-tabs (Lithium carbonate) .[name].. 1 by mouth bid  Patient Instructions: 1)  Please schedule a follow-up appointment in 2 months. Prescriptions: HYDROCODONE-ACETAMINOPHEN 10-650 MG  TABS (HYDROCODONE-ACETAMINOPHEN) qid as needed  #120 x 1   Entered and Authorized by:   Tresa Garter MD   Signed by:   Tresa Garter MD on 07/21/2010   Method used:   Print then Give to Patient   RxID:   [id number] IBUPROFEN 400 MG  TABS (IBUPROFEN) 1 by mouth qid as needed pain  #60 x 6   Entered and Authorized by:   Tresa Garter MD   Signed by:   Tresa Garter MD on 07/21/2010   Method used:   Print then Give to Patient   RxID:   [id number]  Orders Added: 1)  Psychiatric Referral [Psych] 2)  Est. Patient Level V [16109]

## 2010-10-29 NOTE — Progress Notes (Signed)
Summary: Alprazolam clarification  Phone Note From Pharmacy   Summary of Call: Pharmacy called to verify if patient previous prescription was supposed to be XR. Thsi was done almost 1 month ago and I cannot be sure if I specified XR on voicemail, but I did choose incorrectly in our EMR. Confirmed that it should have been the XR and noted refill in med list. Initial call taken by: Lucious Groves,  October 22, 2009 4:48 PM  Follow-up for Phone Call        OK Thx Follow-up by: Tresa Garter MD,  October 23, 2009 12:54 PM    Prescriptions: ALPRAZOLAM XR 1 MG XR24H-TAB (ALPRAZOLAM) 1 by mouth qam Brand medically necessary #30 x 6   Entered by:   Lucious Groves   Authorized by:   Etta Grandchild MD   Signed by:   Lucious Groves on 10/22/2009   Method used:   Telephoned to ...       [facility]  [address] [phone]* (retail)       [address]       [place], [place]  [address]       Ph: [phone] or [phone]       Fax: [phone]   RxID:   [id number]

## 2010-10-29 NOTE — Progress Notes (Signed)
Summary: Alprazolam refill  Phone Note Refill Request Message from:  Patient on December 05, 2009 8:32 AM  Refills Requested: Medication #1:  ALPRAZOLAM XR 1 MG XR24H-TAB 1 by mouth qam [BMN] Initial call taken by: Lucious Groves,  December 05, 2009 8:32 AM  Follow-up for Phone Call        i printed refill x 1 Follow-up by: Minus Breeding MD,  December 05, 2009 8:52 AM  Additional Follow-up for Phone Call Additional follow up Details #1::        rx faxed to pharmacy Additional Follow-up by: Margaret Pyle, CMA,  December 05, 2009 9:37 AM    Prescriptions: ALPRAZOLAM XR 1 MG XR24H-TAB (ALPRAZOLAM) 1 by mouth qam Brand medically necessary #30 x 0   Entered and Authorized by:   Minus Breeding MD   Signed by:   Minus Breeding MD on 12/05/2009   Method used:   Print then Give to Patient   RxID:   [id number]   Appended Document: Alprazolam refill Received paperwork from pharmacy requesting regular Alprazolam not XR. Dr Everardo All asked I put paperwork on his desk and he would look at it.

## 2010-11-03 ENCOUNTER — Encounter: Payer: Self-pay | Admitting: Internal Medicine

## 2010-11-04 ENCOUNTER — Encounter: Payer: Self-pay | Admitting: Internal Medicine

## 2010-11-04 ENCOUNTER — Ambulatory Visit (INDEPENDENT_AMBULATORY_CARE_PROVIDER_SITE_OTHER): Payer: BC Managed Care – PPO | Admitting: Internal Medicine

## 2010-11-04 DIAGNOSIS — F411 Generalized anxiety disorder: Secondary | ICD-10-CM

## 2010-11-04 DIAGNOSIS — M545 Low back pain, unspecified: Secondary | ICD-10-CM

## 2010-11-04 DIAGNOSIS — F3289 Other specified depressive episodes: Secondary | ICD-10-CM

## 2010-11-04 DIAGNOSIS — F329 Major depressive disorder, single episode, unspecified: Secondary | ICD-10-CM

## 2010-11-04 DIAGNOSIS — N63 Unspecified lump in unspecified breast: Secondary | ICD-10-CM | POA: Insufficient documentation

## 2010-11-04 DIAGNOSIS — I10 Essential (primary) hypertension: Secondary | ICD-10-CM

## 2010-11-12 NOTE — Assessment & Plan Note (Signed)
Summary: f/u cont'd problems   Vital Signs:  Patient profile:   45 year old male Height:      68 inches Weight:      232 pounds BMI:     35.40 Temp:     98.6 degrees F oral Pulse rate:   98 / minute Pulse rhythm:   regular Resp:     16 per minute BP sitting:   120 / 80  (left arm) Cuff size:   large  Vitals Entered By: Lanier Prude, CMA(AAMA) (November 04, 2010 3:26 PM) CC: f/u prostatitis not resolved Is Patient Diabetic? No   Primary Care Provider:  Sonda Primes, MD  CC:  f/u prostatitis not resolved.  History of Present Illness: The patient presents for a post-hospital ER  visit for  He returns c/o right flank pain with hematuria, chills, dysuria, and dizziness. Hydrocodone has not helped his pain. C/o sweats, wt gain. Not feeling better. He saw Diane - [facility] PA and she did a UA and a prostate exam. He took Levaquin from ER visit. Diane switched to Bactrim with Meloxicam...   Current Medications (verified): 1)  Hydrocodone-Acetaminophen 10-650 Mg  Tabs (Hydrocodone-Acetaminophen) .... Qid As Needed 2)  Vitamin D3 1000 Unit  Tabs (Cholecalciferol) .... 2 Once Daily By Mouth 3)  Ibuprofen 400 Mg  Tabs (Ibuprofen) .[name].. 1 By Mouth Qid As Needed Pain 4)  Budeprion Sr 150 Mg  Xr12h-Tab (Bupropion Hcl) .[name].. 1 By Mouth Bid 5)  Levsin 0.125 Mg Tabs (Hyoscyamine Sulfate) .[name].. 1-2 By Mouth 2 Times Daily As Needed For Abdominal Pain 6)  Robinul-Forte 2 Mg Tabs (Glycopyrrolate) .... One Tablet By Mouth Two Times A Day 7)  Hydroxyzine Hcl 50 Mg Tabs (Hydroxyzine Hcl) .[name].. 1-2 By Mouth By Mouth Two Times A Day As Needed Anxiety 8)  Alprazolam 2 Mg Tabs (Alprazolam) .[name].. 1 By Mouth Three Times A Day As Needed Anxiety 9)  Acyclovir 400 Mg Tabs (Acyclovir) .[name].. 1 By Mouth Qid 10)  Repliva 21/7 .... 1 By Mouth Once Daily 11)  Bystolic 10 Mg Tabs (Nebivolol Hcl) .[name].. 1 By Mouth Qd 12)  Lithobid 300 Mg Cr-Tabs (Lithium Carbonate) .[name].. 1 By Mouth Bid 13)  Welchol 3.75 Gm Pack (Colesevelam  Hcl) .... Use 1 Packet Once Daily 14)  Glycopyrrolate 2 Mg Tabs (Glycopyrrolate) .[name].. 1 By Mouth Two Times A Day 15)  Wellbutrin Xl 300 Mg Xr24h-Tab (Bupropion Hcl) .[name].. 1 By Mouth Once Daily 16)  Meloxicam 15 Mg Tabs (Meloxicam) .[name].. 1 By Mouth Once Daily 17)  Bactrim Ds 800-160 Mg Tabs (Sulfamethoxazole-Trimethoprim) .... As Directed  Allergies (verified): 1)  ! 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  The patient denies fever, dyspnea on exertion, melena, genital sores, angioedema, and testicular masses.    Physical Exam  General:  Less pale, not diaphoretic, and mild to no   distress Head:  normocephalic and atraumatic.   Mouth:  Oral mucosa and oropharynx without lesions or exudates.  Teeth in good repair. Neck:  No deformities,  masses, or tenderness noted. Lungs:  Normal respiratory effort, chest expands symmetrically. Lungs are clear to auscultation, no crackles or wheezes. Heart:  Normal rate and regular rhythm. S1 and S2 normal without gallop, murmur, click, rub or other extra sounds. Abdomen:  soft, non-tender, no distention, no masses, no guarding, no rigidity, no rebound tenderness, no abdominal hernia, no inguinal hernia, no hepatomegaly, no splenomegaly, and bowel sounds hypoactive.   Msk:  No deformity or scoliosis noted of thoracic or lumbar spine.   Extremities:  No clubbing, cyanosis, edema, or deformity noted with normal full range of motion of  all joints.   Neurologic:  alert & oriented X3 and gait normal.  Tremor in B hands is present Skin:  turgor normal, color normal, no rashes, no suspicious lesions, no ecchymoses, no petechiae, no purpura, no ulcerations, and no edema.   Psych:  Oriented X3, memory intact for recent and remote,  depressed appearing, and moderately anxious.  not suicidal.     Impression & Recommendations:  Problem # 1:  PROSTATITIS, ACUTE (ICD-601.0) Assessment Improved He is still sick... On the regimen of medicine(s) reflected in the chart    Problem # 2:  LOW BACK PAIN (ICD-724.2) R Assessment: Deteriorated  His updated medication list for this problem includes:    Hydrocodone-acetaminophen 10-650 Mg Tabs (Hydrocodone-acetaminophen) ..... Qid as needed    Ibuprofen 400 Mg Tabs (Ibuprofen) .[name]... 1 by mouth qid as needed pain    Meloxicam 15 Mg Tabs (Meloxicam) .[name]... 1 by mouth once daily  Problem # 3:  PANIC DISORDER (ICD-300.01) Assessment: Unchanged  His updated medication list for this problem includes:    Budeprion Sr 150 Mg Xr12h-tab (Bupropion hcl) .[name]... 1 by mouth bid    Hydroxyzine Hcl 50 Mg Tabs (Hydroxyzine hcl) .[name]... 1-2 by mouth by mouth two times a day as needed anxiety    Alprazolam 2 Mg Tabs (Alprazolam) .[name]... 1 by mouth three times a day as needed anxiety    Wellbutrin Xl 300 Mg Xr24h-tab (Bupropion hcl) .[name]... 1 by mouth once daily  Problem # 4:  TREMOR (ICD-781.0) Assessment: Unchanged  Problem # 5:  DEPRESSION (ICD-311) Assessment: Unchanged  His updated medication list for this problem includes:    Budeprion Sr 150 Mg Xr12h-tab (Bupropion hcl) .[name]... 1 by mouth bid    Hydroxyzine Hcl 50 Mg Tabs (Hydroxyzine hcl) .[name]... 1-2 by mouth by mouth two times a day as needed anxiety    Alprazolam 2 Mg Tabs (Alprazolam) .[name]... 1 by mouth three times a day as needed anxiety    Wellbutrin Xl 300 Mg Xr24h-tab (Bupropion hcl) .[name]... 1 by mouth once daily  Orders: Psychiatric Referral  (Psych) Dr Dub Mikes  Complete Medication List: 1)  Hydrocodone-acetaminophen 10-650 Mg Tabs (Hydrocodone-acetaminophen) .... Qid as needed 2)  Vitamin D3 1000 Unit Tabs (Cholecalciferol) .... 2 once daily by mouth 3)  Ibuprofen 400 Mg Tabs (Ibuprofen) .[name].. 1 by mouth qid as needed pain 4)  Budeprion Sr 150 Mg Xr12h-tab (Bupropion hcl) .[name].. 1 by mouth bid 5)  Levsin 0.125 Mg Tabs (Hyoscyamine sulfate) .[name].. 1-2 by mouth 2 times daily as needed for abdominal pain 6)  Robinul-forte 2 Mg Tabs (Glycopyrrolate) .... One tablet by mouth two times a day 7)  Hydroxyzine Hcl 50 Mg Tabs (Hydroxyzine hcl) .[name].. 1-2 by mouth by mouth two times a day as needed anxiety 8)  Alprazolam 2 Mg Tabs (Alprazolam) .[name].. 1 by mouth three times a day as needed anxiety 9)  Acyclovir 400 Mg  Tabs (Acyclovir) .[name].. 1 by mouth qid 10)  Repliva 21/7  .... 1 by mouth once daily 11)  Bystolic 10 Mg Tabs (Nebivolol hcl) .[name].. 1 by mouth qd 12)  Lithobid 300 Mg Cr-tabs (Lithium carbonate) .[name].. 1 by mouth bid 13)  Welchol 3.75 Gm Pack (Colesevelam hcl) .... Use 1 packet once daily 14)  Glycopyrrolate 2 Mg Tabs (Glycopyrrolate) .[name].. 1 by mouth two times a day 15)  Wellbutrin Xl 300 Mg Xr24h-tab (Bupropion hcl) .[name].. 1 by mouth once daily 16)  Meloxicam 15 Mg Tabs (Meloxicam) .[name].. 1 by mouth once daily 17)  Bactrim Ds 800-160 Mg Tabs (Sulfamethoxazole-trimethoprim) .... As directed  Patient Instructions: 1)  Please schedule a follow-up appointment in 3-4 weeks. 2)  BMP prior to visit, ICD-9: 3)  CBC w/ Diff prior to visit, ICD-9: 4)  Urine Microalbumin prior to visit, ICD-9: 401.1 595.0   Orders Added: 1)  Est. Patient Level IV [16109] 2)  Psychiatric Referral [Psych]

## 2010-11-13 ENCOUNTER — Telehealth: Payer: Self-pay | Admitting: Internal Medicine

## 2010-11-17 DIAGNOSIS — Z0279 Encounter for issue of other medical certificate: Secondary | ICD-10-CM

## 2010-11-18 NOTE — Letter (Signed)
[facility]   Imported By: Sherian Rein 11/11/2010 08:50:45  _____________________________________________________________________  External Attachment:    Type:   Image     Comment:   External Document

## 2010-11-18 NOTE — Progress Notes (Signed)
Summary: rx refill req-AVP pt  Phone Note Refill Request Message from:  Fax from Pharmacy on November 13, 2010 4:42 PM  Refills Requested: Medication #1:  HYDROCODONE-ACETAMINOPHEN 10-650 MG  TABS qid as needed   Dosage confirmed as above?Dosage Confirmed   Last Refilled: 10/07/2010  Medication #2:  ALPRAZOLAM 2 MG TABS 1 by mouth three times a day as needed anxiety   Dosage confirmed as above?Dosage Confirmed   Last Refilled: 10/07/2010 [facility] [address]-[facility]P PT-please advise  Initial call taken by: Brenton Grills CMA Duncan Dull),  November 13, 2010 4:42 PM  Follow-up for Phone Call        ok to refill: 1) hydrocodone/APAP 10/650 full months count 1 refill 2) xanax 2mg  ok full months count and 1 refill Follow-up by: Jacques Navy MD,  November 13, 2010 5:56 PM    Prescriptions: ALPRAZOLAM 2 MG TABS (ALPRAZOLAM) 1 by mouth three times a day as needed anxiety  #90 x 1   Entered by:   Lamar Sprinkles, CMA   Authorized by:   Tresa Garter MD   Signed by:   Lamar Sprinkles, CMA on 11/13/2010   Method used:   Telephoned to ...       [facility] [address] [phone]* (retail)       [address]       [place], [place]  [address]       Ph: [phone] or [phone]       Fax: [phone]   RxID:   [id number] HYDROCODONE-ACETAMINOPHEN 10-650 MG  TABS (HYDROCODONE-ACETAMINOPHEN) qid as needed  #120 x 1   Entered by:   Lamar Sprinkles, CMA   Authorized by:   Tresa Garter MD   Signed by:   Lamar Sprinkles, CMA on 11/13/2010   Method used:   Telephoned to ...       [facility] [address] [phone]* (retail)       [address]       [place], [place]  [address]       Ph: [phone] or [phone]       Fax: [phone]   RxID:   [id number]

## 2010-11-27 ENCOUNTER — Encounter: Payer: Self-pay | Admitting: Internal Medicine

## 2010-11-30 ENCOUNTER — Other Ambulatory Visit: Payer: BC Managed Care – PPO

## 2010-11-30 ENCOUNTER — Encounter: Payer: Self-pay | Admitting: Internal Medicine

## 2010-12-04 ENCOUNTER — Ambulatory Visit (INDEPENDENT_AMBULATORY_CARE_PROVIDER_SITE_OTHER): Payer: BC Managed Care – PPO | Admitting: Internal Medicine

## 2010-12-04 ENCOUNTER — Other Ambulatory Visit: Payer: BC Managed Care – PPO

## 2010-12-04 ENCOUNTER — Encounter: Payer: Self-pay | Admitting: Internal Medicine

## 2010-12-04 ENCOUNTER — Other Ambulatory Visit: Payer: Self-pay | Admitting: Internal Medicine

## 2010-12-04 DIAGNOSIS — I10 Essential (primary) hypertension: Secondary | ICD-10-CM

## 2010-12-04 DIAGNOSIS — F410 Panic disorder [episodic paroxysmal anxiety] without agoraphobia: Secondary | ICD-10-CM

## 2010-12-04 DIAGNOSIS — F329 Major depressive disorder, single episode, unspecified: Secondary | ICD-10-CM

## 2010-12-04 DIAGNOSIS — D649 Anemia, unspecified: Secondary | ICD-10-CM

## 2010-12-04 DIAGNOSIS — R5381 Other malaise: Secondary | ICD-10-CM

## 2010-12-04 DIAGNOSIS — F411 Generalized anxiety disorder: Secondary | ICD-10-CM

## 2010-12-04 DIAGNOSIS — R259 Unspecified abnormal involuntary movements: Secondary | ICD-10-CM

## 2010-12-04 DIAGNOSIS — R5383 Other fatigue: Secondary | ICD-10-CM

## 2010-12-04 LAB — CBC WITH DIFFERENTIAL/PLATELET
Basophils Absolute: 0 10*3/uL (ref 0.0–0.1)
HCT: 39 % (ref 39.0–52.0)
Lymphs Abs: 2.1 10*3/uL (ref 0.7–4.0)
MCHC: 33 g/dL (ref 30.0–36.0)
MCV: 75.9 fl — ABNORMAL LOW (ref 78.0–100.0)
Monocytes Absolute: 1.1 10*3/uL — ABNORMAL HIGH (ref 0.1–1.0)
Platelets: 297 10*3/uL (ref 150.0–400.0)
RDW: 14.5 % (ref 11.5–14.6)

## 2010-12-04 LAB — BASIC METABOLIC PANEL
Calcium: 9.5 mg/dL (ref 8.4–10.5)
GFR: 62.72 mL/min (ref >60.00)
Potassium: 4.4 mEq/L (ref 3.5–5.1)
Sodium: 140 mEq/L (ref 135–145)

## 2010-12-04 LAB — T3, FREE: T3, Free: 3.4 pg/mL (ref 2.3–4.2)

## 2010-12-08 NOTE — Assessment & Plan Note (Signed)
Summary: 3-4 wk f/u   Vital Signs:  Patient profile:   45 year old male Height:      68 inches Weight:      243 pounds BMI:     37.08 Temp:     99.0 degrees F oral Pulse rate:   92 / minute Pulse rhythm:   regular Resp:     16 per minute BP sitting:   138 / 90  (left arm) Cuff size:   regular  Vitals Entered By: Lanier Prude, CMA(AAMA) (December 04, 2010 10:20 AM) CC: f/u Is Patient Diabetic? No Comments pt is not taking Lithobid, welchol or Meloxicam   Primary Care Provider:  Sonda Primes, MD  CC:  f/u.  History of Present Illness: The patient presents for a follow up of depression and tremor, hypertension He saw Dr Dub Mikes  2 d ago - he stopped Lithium and increased Wellbutrin Dr Dub Mikes suggested Lyrica and Adderall to try He was found to have blood in urine - cystoscopy is scheduled  Current Medications (verified): 1)  Hydrocodone-Acetaminophen 10-650 Mg  Tabs (Hydrocodone-Acetaminophen) .... Qid As Needed 2)  Vitamin D3 1000 Unit  Tabs (Cholecalciferol) .... 2 Once Daily By Mouth 3)  Ibuprofen 400 Mg  Tabs (Ibuprofen) .[name].. 1 By Mouth Qid As Needed Pain 4)  Budeprion Sr 150 Mg  Xr12h-Tab (Bupropion Hcl) .[name].. 1 By Mouth Bid 5)  Levsin 0.125 Mg Tabs (Hyoscyamine Sulfate) .[name].. 1-2 By Mouth 2 Times Daily As Needed For Abdominal Pain 6)  Hydroxyzine Hcl 50 Mg Tabs (Hydroxyzine Hcl) .[name].. 1-2 By Mouth By Mouth Two Times A Day As Needed Anxiety 7)  Alprazolam 2 Mg Tabs (Alprazolam) .[name].. 1 By Mouth Three Times A Day As Needed Anxiety 8)  Acyclovir 400 Mg Tabs (Acyclovir) .[name].. 1 By Mouth Qid 9)  Repliva 21/7 .... 1 By Mouth Once Daily 10)  Bystolic 10 Mg Tabs (Nebivolol Hcl) .[name].. 1 By Mouth Qd 11)  Lithobid 300 Mg Cr-Tabs (Lithium Carbonate) .[name].. 1 By Mouth Bid 12)  Welchol 3.75 Gm Pack (Colesevelam Hcl) .... Use 1 Packet Once Daily 13)  Glycopyrrolate 2 Mg Tabs (Glycopyrrolate) .[name].. 1 By Mouth Two Times A Day 14)  Meloxicam 15 Mg Tabs (Meloxicam) .[name].. 1 By Mouth Once Daily 15)   Bactrim Ds 800-160 Mg Tabs (Sulfamethoxazole-Trimethoprim) .... As Directed 16)  B Complex  Tabs (B Complex Vitamins) .... 2 By Mouth Once Daily 17)  Levaquin 500 Mg Tabs (Levofloxacin) .[name].. 1 By Mouth Once Daily 18)  Flax Seeds  Powd (Flaxseed (Linseed)) .... Two Times A Day  Allergies (verified): 1)  ! 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  The patient denies fever,  weight loss, dyspnea on exertion, hematochezia, difficulty walking, prolonged cough, headaches, hemoptysis, and melena.    Physical Exam  General:   not diaphoretic, and mild to no   distress trembling voice Head:  normocephalic and atraumatic.   Eyes:  vision grossly intact and pupils equal.   Ears:  R ear normal and L ear normal.   Nose:  External nasal examination shows no deformity or inflammation. Nasal mucosa are pink and moist without lesions or exudates. Mouth:  Oral mucosa and oropharynx without lesions or exudates.  Teeth in good repair. Neck:  No deformities,  masses, or tenderness noted. Lungs:  Normal respiratory effort, chest expands symmetrically. Lungs are clear to auscultation, no crackles or wheezes. Heart:  Normal rate and regular rhythm. S1 and S2 normal without gallop, murmur, click, rub or other extra sounds. Abdomen:  soft, non-tender, no distention, no masses, no guarding, no rigidity, no rebound tenderness, no abdominal hernia, no inguinal hernia, no hepatomegaly, no splenomegaly, and bowel sounds hypoactive.   Genitalia:  per Urol Prostate:  per Urol Msk:  No deformity or scoliosis noted of thoracic or lumbar spine.   Pulses:  R and L carotid,radial,femoral,dorsalis pedis and posterior tibial pulses are full and equal bilaterally Extremities:  No clubbing, cyanosis, edema, or deformity noted with normal full range of motion of all joints.   Neurologic:  alert & oriented X3 and gait normal.  Tremor in B hands is present Skin:  Intact without suspicious lesions or rashes   Impression & Recommendations:  Problem # 1:  DEPRESSION (ICD-311) Assessment Unchanged We will try Adderall per Dr Runell Gess suggestion (?ADD) to help with focusing The following medications were removed from the medication list:    Budeprion Sr 150 Mg Xr12h-tab (Bupropion hcl) .[name]... 1 by mouth bid    Wellbutrin Xl 300 Mg Xr24h-tab (Bupropion hcl) .[name]... 1 by mouth once daily His updated medication list for this problem includes:    Hydroxyzine Hcl 50 Mg Tabs (Hydroxyzine hcl) .[name]... 1-2 by mouth by mouth two times a day as needed anxiety    Alprazolam 2 Mg Tabs (Alprazolam) .[name]... 1 by mouth three times a day as needed anxiety    Wellbutrin Sr 150 Mg Xr12h-tab (Bupropion hcl) .[name]... 1 by mouth tid  Problem # 2:  PANIC DISORDER (ICD-300.01) Assessment: Deteriorated  The following medications were removed from the medication list:    Budeprion Sr 150 Mg Xr12h-tab (Bupropion hcl) .[name]... 1 by mouth bid    Wellbutrin Xl 300 Mg Xr24h-tab (Bupropion hcl) .[name]... 1 by mouth  once daily His updated medication list for this problem includes:    Hydroxyzine Hcl 50 Mg Tabs (Hydroxyzine hcl) .[name]... 1-2 by mouth by mouth two times a day as needed anxiety    Alprazolam 2 Mg Tabs (Alprazolam) .[name]... 1 by mouth three times a day as needed anxiety    Wellbutrin Sr 150 Mg Xr12h-tab (Bupropion hcl) .[name]... 1 by mouth tid  Orders: TLB-BMP (Basic Metabolic Panel-BMET) (80048-METABOL) TLB-T3, Free (Triiodothyronine) (84481-T3FREE) TLB-T4 (Thyrox), Free [phone]) TLB-TSH (Thyroid Stimulating Hormone) (84443-TSH) TLB-Testosterone, Total (84403-TESTO)  Problem # 3:  INSOMNIA, PERSISTENT (ICD-307.42) Assessment: Unchanged On the regimen of medicine(s) reflected in the chart    Problem # 4:  TREMOR (ICD-781.0) Assessment: Improved  Problem # 5:  ANXIETY (ICD-300.00) Assessment: Deteriorated  The following medications were removed from the medication list:    Budeprion Sr 150 Mg Xr12h-tab (Bupropion hcl) .[name]... 1 by mouth bid    Wellbutrin Xl 300 Mg Xr24h-tab (  Bupropion hcl) .[name]... 1 by mouth once daily His updated medication list for this problem includes:    Hydroxyzine Hcl 50 Mg Tabs (Hydroxyzine hcl) .[name]... 1-2 by mouth by mouth two times a day as needed anxiety    Alprazolam 2 Mg Tabs (Alprazolam) .[name]... 1 by mouth three times a day as needed anxiety    Wellbutrin Sr 150 Mg Xr12h-tab (Bupropion hcl) .[name]... 1 by mouth tid  Orders: TLB-BMP (Basic Metabolic Panel-BMET) (80048-METABOL) TLB-T3, Free (Triiodothyronine) (84481-T3FREE) TLB-T4 (Thyrox), Free [phone]) TLB-TSH (Thyroid Stimulating Hormone) (84443-TSH) TLB-Testosterone, Total (84403-TESTO)  Problem # 6:  PROSTATITIS, ACUTE (ICD-601.0) Assessment: Deteriorated He is back on Levaquin Cystoscopy is pending   Problem # 7:  LOW BACK PAIN (ICD-724.2)/leg pain Assessment: Unchanged Dr Dub Mikes have suggested Lyrica His updated medication list for this problem includes:    Hydrocodone-acetaminophen 10-650 Mg Tabs  (Hydrocodone-acetaminophen) ..... Qid as needed    Ibuprofen 400 Mg Tabs (Ibuprofen) .[name]... 1 by mouth qid as needed pain    Meloxicam 15 Mg Tabs (Meloxicam) .[name]... 1 by mouth once daily  Complete Medication List: 1)  Hydrocodone-acetaminophen 10-650 Mg Tabs (Hydrocodone-acetaminophen) .... Qid as needed 2)  Vitamin D3 1000 Unit Tabs (Cholecalciferol) .... 2 once daily by mouth 3)  Ibuprofen 400 Mg Tabs (Ibuprofen) .[name].. 1 by mouth qid as needed pain 4)  Levsin 0.125 Mg Tabs (Hyoscyamine sulfate) .[name].. 1-2 by mouth 2 times daily as needed for abdominal pain 5)  Hydroxyzine Hcl 50 Mg Tabs (Hydroxyzine hcl) .[name].. 1-2 by mouth by mouth two times a day as needed anxiety 6)  Alprazolam 2 Mg Tabs (Alprazolam) .[name].. 1 by mouth three times a day as needed anxiety 7)  Acyclovir 400 Mg Tabs (Acyclovir) .[name].. 1 by mouth qid 8)  Repliva 21/7  .... 1 by mouth once daily 9)  Bystolic 10 Mg Tabs (Nebivolol hcl) .[name].. 1 by mouth qd 10)  Welchol 3.75 Gm Pack (Colesevelam hcl) .... Use 1 packet once daily 11)  Glycopyrrolate 2 Mg Tabs (Glycopyrrolate) .[name].. 1 by mouth two times a day 12)  Meloxicam 15 Mg Tabs (Meloxicam) .[name].. 1 by mouth once daily 13)  Bactrim Ds 800-160 Mg Tabs (Sulfamethoxazole-trimethoprim) .... As directed 14)  B Complex Tabs (B complex vitamins) .... 2 by mouth once daily 15)  Levaquin 500 Mg Tabs (Levofloxacin) .[name].. 1 by mouth once daily 16)  Flax Seeds Powd (Flaxseed (linseed)) .... Two times a day 17)  Wellbutrin Sr 150 Mg Xr12h-tab (Bupropion hcl) .[name].. 1 by mouth tid 18)  Adderall 10 Mg Tabs (Amphetamine-dextroamphetamine) .[name].. 1 by mouth two times a day (am and lunch)  Other Orders: TLB-CBC Platelet - w/Differential (85025-CBCD)  Patient Instructions: 1)  Please schedule a follow-up appointment in 2 months. Prescriptions: HYDROCODONE-ACETAMINOPHEN 10-650 MG  TABS (HYDROCODONE-ACETAMINOPHEN) qid as needed  #120 x 1   Entered and Authorized by:   Tresa Garter MD   Signed by:    Tresa Garter MD on 12/04/2010   Method used:   Print then Give to Patient   RxID:   [id number] ALPRAZOLAM 2 MG TABS (ALPRAZOLAM) 1 by mouth three times a day as needed anxiety  #90 x 0   Entered and Authorized by:   Tresa Garter MD   Signed by:   Tresa Garter MD on 12/04/2010   Method used:   Print then Give to Patient   RxID:   [id number] HYDROXYZINE HCL 50 MG TABS (HYDROXYZINE HCL) 1-2 by mouth by mouth two times a day  as needed anxiety  #120 Tablet x 3   Entered and Authorized by:   Tresa Garter MD   Signed by:   Tresa Garter MD on 12/04/2010   Method used:   Print then Give to Patient   RxID:   [id number] ADDERALL 10 MG TABS (AMPHETAMINE-DEXTROAMPHETAMINE) 1 by mouth two times a day (am and lunch)  #60 x 0   Entered and Authorized by:   Tresa Garter MD   Signed by:   Tresa Garter MD on 12/04/2010   Method used:   Print then Give to Patient   RxID:   [id number]    Orders Added: 1)  Est. Patient Level V [84696] 2)  TLB-BMP (Basic Metabolic Panel-BMET) [80048-METABOL] 3)  TLB-T3, Free (Triiodothyronine) [29528-U1LKGM] 4)  TLB-T4 (Thyrox), Free [01027-OZ3G] 5)  TLB-TSH (Thyroid Stimulating Hormone) [84443-TSH] 6)  TLB-Testosterone, Total [84403-TESTO] 7)  TLB-CBC Platelet - w/Differential [85025-CBCD]

## 2010-12-10 ENCOUNTER — Telehealth: Payer: Self-pay | Admitting: Internal Medicine

## 2010-12-15 DIAGNOSIS — Z0279 Encounter for issue of other medical certificate: Secondary | ICD-10-CM

## 2010-12-15 NOTE — Progress Notes (Signed)
Summary: ADDERALL PA APPROVED  Phone Note Call from Patient Call back at [phone]   Summary of Call: Pt left vm w/Latoya - needs PA ? on med Initial call taken by: Lamar Sprinkles, CMA,  December 10, 2010 9:35 AM  Follow-up for Phone Call        Needs PA on Adderall - [phone], called and approved over the phone. Approval x 12 mths. Pt informed  Follow-up by: Lamar Sprinkles, CMA,  December 10, 2010 4:32 PM  Additional Follow-up for Phone Call Additional follow up Details #1::        Thank you!  He needs to see me to discuss testosterone Additional Follow-up by: Tresa Garter MD,  December 10, 2010 8:25 PM    Additional Follow-up for Phone Call Additional follow up Details #2::    FYI - Pt has scheduled apt 3/27 Follow-up by: Lamar Sprinkles, CMA,  December 11, 2010 9:38 AM

## 2010-12-15 NOTE — Letter (Signed)
[facility]   Imported By: Sherian Rein 12/09/2010 09:28:15  _____________________________________________________________________  External Attachment:    Type:   Image     Comment:   External Document

## 2010-12-15 NOTE — Letter (Signed)
Summary: [facility]   Imported By: Sherian Rein 12/09/2010 12:27:04  _____________________________________________________________________  External Attachment:    Type:   Image     Comment:   External Document

## 2010-12-21 ENCOUNTER — Telehealth: Payer: Self-pay | Admitting: *Deleted

## 2010-12-21 NOTE — Telephone Encounter (Signed)
rec rf req for alprazolam 2 mg  1 po tid.  #90...........[name] Last filled 12-04-10..........Marland Kitchenok to Rf?

## 2010-12-21 NOTE — Telephone Encounter (Signed)
He can have it filled on 01/04/11

## 2010-12-22 ENCOUNTER — Ambulatory Visit (INDEPENDENT_AMBULATORY_CARE_PROVIDER_SITE_OTHER): Payer: BC Managed Care – PPO | Admitting: Internal Medicine

## 2010-12-22 ENCOUNTER — Encounter: Payer: Self-pay | Admitting: Internal Medicine

## 2010-12-22 ENCOUNTER — Other Ambulatory Visit (INDEPENDENT_AMBULATORY_CARE_PROVIDER_SITE_OTHER): Payer: BC Managed Care – PPO

## 2010-12-22 VITALS — BP 128/90 | HR 92 | Temp 99.0°F | Resp 16 | Ht 68.0 in | Wt 234.0 lb

## 2010-12-22 DIAGNOSIS — F988 Other specified behavioral and emotional disorders with onset usually occurring in childhood and adolescence: Secondary | ICD-10-CM | POA: Insufficient documentation

## 2010-12-22 DIAGNOSIS — F329 Major depressive disorder, single episode, unspecified: Secondary | ICD-10-CM

## 2010-12-22 DIAGNOSIS — F3289 Other specified depressive episodes: Secondary | ICD-10-CM

## 2010-12-22 DIAGNOSIS — F411 Generalized anxiety disorder: Secondary | ICD-10-CM

## 2010-12-22 DIAGNOSIS — E291 Testicular hypofunction: Secondary | ICD-10-CM

## 2010-12-22 DIAGNOSIS — F909 Attention-deficit hyperactivity disorder, unspecified type: Secondary | ICD-10-CM

## 2010-12-22 MED ORDER — TESTOSTERONE 20.25 MG/ACT (1.62%) TD GEL: 2.0000 "application " | TRANSDERMAL | Status: DC

## 2010-12-22 MED ORDER — AMPHETAMINE-DEXTROAMPHETAMINE 10 MG PO TABS: 10.0000 mg | ORAL_TABLET | Freq: Two times a day (BID) | ORAL | Status: DC

## 2010-12-22 MED ORDER — TESTOSTERONE 20.25 MG/ACT (1.62%) TD GEL: 2.0000 [IU] | TRANSDERMAL | Status: DC

## 2010-12-22 NOTE — Telephone Encounter (Signed)
Pharmacy informed.

## 2010-12-22 NOTE — Progress Notes (Signed)
  Subjective:    Patient ID: Mark Mcgee, male    DOB: 12/15/1965, 45 y.o.   MRN: [id number]  HPI Mark Mcgee is here to f/u on his ADD (he can focus better) and to f/u on his low testost level. C/o chronic fatigue and apathy - not better. He denies wt loss.   Review of Systems  Constitutional: Negative for fever and chills.  HENT: Positive for neck pain. Negative for congestion and sneezing.   Eyes: Negative for discharge and visual disturbance.  Respiratory: Negative for chest tightness and shortness of breath.   Cardiovascular: Negative for chest pain.  Gastrointestinal: Negative for nausea and abdominal pain.  Genitourinary: Negative for dysuria.  Musculoskeletal: Positive for back pain and arthralgias. Negative for myalgias.  Neurological: Negative for speech difficulty.  Psychiatric/Behavioral: Positive for sleep disturbance, dysphoric mood, decreased concentration (better) and agitation. Negative for suicidal ideas, hallucinations and confusion. The patient is nervous/anxious. Hyperactive: better.        Objective:   Physical Exam  Constitutional: No distress.  HENT:  Head: Normocephalic and atraumatic.  Eyes: Pupils are equal, round, and reactive to light.  Cardiovascular: Normal rate.   Musculoskeletal: He exhibits no edema.  Neurological: He displays tremor. No cranial nerve deficit. Coordination normal.  Psychiatric: His mood appears anxious. He is is not hyperactive and not withdrawn. He does not express inappropriate judgment. He expresses no suicidal ideation. He is attentive.          Assessment & Plan:  Hypogonadism male Options to treat discussed. Ordered labs and a brain MRI.  Potential benefits of a long term testosterone use as well as potential risks  and complications were explained to the patient and were aknowledged. Start Androgel   ANXIETY Cont current Rx  DEPRESSION Testost replacement therapy may help w/depression/apathy  ADD (attention  deficit disorder with hyperactivity) Cont w/meds  Potential benefits of a long term amphetamine use as well as potential risks (i.e. addiction  etc) and complications (i.e. Palpitations, constipation and others) were explained to the patient and were aknowledged.

## 2010-12-22 NOTE — Assessment & Plan Note (Signed)
Options to treat discussed. Ordered labs and a brain MRI.  Potential benefits of a long term testosterone use as well as potential risks  and complications were explained to the patient and were aknowledged. Start Androgel

## 2010-12-22 NOTE — Assessment & Plan Note (Signed)
Testost replacement therapy may help w/depression/apathy

## 2010-12-22 NOTE — Assessment & Plan Note (Signed)
Cont current Rx 

## 2010-12-22 NOTE — Patient Instructions (Signed)
Labs prior

## 2010-12-22 NOTE — Assessment & Plan Note (Signed)
Cont w/meds  Potential benefits of a long term amphetamine use as well as potential risks (i.e. addiction  etc) and complications (i.e. Palpitations, constipation and others) were explained to the patient and were aknowledged.

## 2010-12-23 LAB — LUTEINIZING HORMONE: LH: 1.67 m[IU]/mL (ref 1.50–9.30)

## 2010-12-24 ENCOUNTER — Telehealth: Payer: Self-pay | Admitting: *Deleted

## 2010-12-24 NOTE — Telephone Encounter (Signed)
error 

## 2010-12-24 NOTE — Progress Notes (Signed)
Addended by: Lamar Sprinkles on: 12/24/2010 10:06 AM   Modules accepted: Orders

## 2010-12-28 ENCOUNTER — Telehealth: Payer: Self-pay | Admitting: Internal Medicine

## 2010-12-28 NOTE — Telephone Encounter (Signed)
Fax request for PA for Androgel 1.62% Gel Pump Called TP Prior Authorization Phone: [phone] Received information from Rep/LaMarcus at [facility] that Benefits Expired on 09/26/2010 Fax this information back to [facility] at [phone]

## 2010-12-31 NOTE — Telephone Encounter (Signed)
Faxed waiting on approval.  °

## 2011-01-04 NOTE — Telephone Encounter (Signed)
PA Approval received & faxed to pharmacy. LMOM to inform Pt.

## 2011-01-05 ENCOUNTER — Telehealth: Payer: Self-pay | Admitting: Internal Medicine

## 2011-01-05 DIAGNOSIS — E291 Testicular hypofunction: Secondary | ICD-10-CM

## 2011-01-05 DIAGNOSIS — M542 Cervicalgia: Secondary | ICD-10-CM

## 2011-01-05 DIAGNOSIS — R202 Paresthesia of skin: Secondary | ICD-10-CM

## 2011-01-05 DIAGNOSIS — G8929 Other chronic pain: Secondary | ICD-10-CM

## 2011-01-05 NOTE — Telephone Encounter (Signed)
Pt states that he had discussed getting MRI of head & neck done w/you and he would now like to get that ordered so that he can have completed before the end of April [2012] due to Insurance changes.  Pt also would like to get copy of last [2] Labs including Testosterone levels for insurance needs.

## 2011-01-05 NOTE — Telephone Encounter (Signed)
OK - will order MRIs OK copies Thx

## 2011-01-06 NOTE — Telephone Encounter (Signed)
Pt informed. Copies of labs in mail.

## 2011-01-19 ENCOUNTER — Telehealth: Payer: Self-pay | Admitting: *Deleted

## 2011-01-19 NOTE — Telephone Encounter (Signed)
rec fax from pt's ins stating MRI has been denied. I asked pt per Dr. Posey Rea if he wishes to see a neurologist. He states he will address this at his next OV on 02-23-11.

## 2011-01-21 ENCOUNTER — Telehealth: Payer: Self-pay | Admitting: *Deleted

## 2011-01-21 NOTE — Telephone Encounter (Signed)
rec rf req for Androgel 1.62% gel pump. Apply 2 pumps qam as directed. 75gm... Last filled 01-05-11 Ok to Rf?

## 2011-01-21 NOTE — Telephone Encounter (Signed)
OK to fill this prescription with additional refills x5 Thank you!  

## 2011-01-22 MED ORDER — TESTOSTERONE 20.25 MG/ACT (1.62%) TD GEL: 2.0000 "application " | TRANSDERMAL | Status: DC

## 2011-01-29 ENCOUNTER — Other Ambulatory Visit: Payer: Self-pay | Admitting: *Deleted

## 2011-01-29 MED ORDER — BUPROPION HCL ER (SR) 150 MG PO TB12: 150.0000 mg | ORAL_TABLET | Freq: Three times a day (TID) | ORAL | Status: DC

## 2011-02-08 ENCOUNTER — Ambulatory Visit (HOSPITAL_COMMUNITY): Payer: BC Managed Care – PPO

## 2011-02-09 NOTE — Assessment & Plan Note (Signed)
[facility]                           PRIMARY CARE OFFICE NOTE   HARU, SHAFF                      MRN:          [id number]  DATE:04/11/2007                            DOB:          Aug 27, 1966    The patient is a 45 year old male who presents for wellness examination.  Past medical history, family history, social history is per April 06, 2006 note.   ALLERGIES:  No known drug allergies.   CURRENT MEDICINES:  1. Wellbutrin XR 300 mg daily.  2. Procardia XL.  3. He has not been using any prescription drugs for pain except for      ibuprofen p.r.n.   REVIEW OF SYSTEMS:  Burning chest pain.  I guess we failed to schedule  the appointment with the pain clinic due to miscommunication.  His chest  pain is reasonable now.  Denies indigestion.  At present, emotionally  doing fairly well.  The rest of the 18 point review of systems is  negative.   PHYSICAL:  Blood pressure 147/87, pulse 90, temp 99.8, weight 196  pounds.  He is in no acute distress, looks well.  HEENT:  Moist mucosa.  NECK:  Supple.  No thyromegaly or bruit.  LUNGS:  Clear, no wheezes or rales.  HEART:  S1, S2, no murmur, no gallop.  Chest wall tender to palpation  over costochondral junctions bilaterally mild.  Abdomen without  abnormalities.  No evidence of hernias .  EXTREMITIES:  Lower extremities without edema.  He was alert, oriented, cooperative, and denies being depressed.  RECTAL/GENITAL:  Declined by the patient.  SKIN:  A mole on the back with somewhat irregular color.   ASSESSMENT/PLAN:  1. Normal well examination.  Age/health issues discussed.  Healthy      lifestyle discussed.  Needs to exercise and eat well which he is      doing.  We did obtain lab work appropriate for age and human      immunodeficiency virus test (false positive HIV ELISA test in the      past).  We will obtain Western blot.  EKG today is normal.  2. Chest wall pain due to chronic  recurrent severe costochondritis.  I      gave him Voltaren cream to use 2-4 times a day.  If flares up      significantly, we will do the injections here with short insulin      needle.  He will use ice p.r.n.  3. Occasional hip pain.  Orthotics prescribed.  4. Depression.  Wellbutrin refilled.  5. Mole on the back.  He will schedule biopsy with me.  6. Neck pain, doing fairly well lately.     Georgina Quint. Plotnikov, MD  Electronically Signed    AVP/MedQ  DD: 04/13/2007  DT: 04/14/2007  Job #: [id number]

## 2011-02-10 ENCOUNTER — Encounter: Payer: Self-pay | Admitting: Internal Medicine

## 2011-02-12 ENCOUNTER — Ambulatory Visit: Payer: BC Managed Care – PPO | Admitting: Internal Medicine

## 2011-02-12 NOTE — Consult Note (Signed)
NAME:  Mark Mcgee, Mark Mcgee NO.:  [id number]   MEDICAL RECORD NO.:  [id number]          PATIENT TYPE:  EMS   LOCATION:  [facility]                         FACILITY:  [facility]   PHYSICIAN:  Arvilla Meres, M.D. LHCDATE OF BIRTH:  10-03-1965   DATE OF CONSULTATION:  DATE OF DISCHARGE:                                   CONSULTATION   CONSULTING PHYSICIAN:  Arvilla Meres, M.D. [facility].   PRIMARY CARE PHYSICIAN:  Georgina Quint. Plotnikov, M.D. [facility].   PATIENT PROFILE:  A 45 year old white male with no prior history of CAD who  presents with back, chest, and neck discomfort.   PROBLEM LIST:  1.  Back, chest, and neck pain.  2.  History of kidney stone in 1991.  3.  History of IVP dye allergy with flushing and shortness of breath.  4.  History pericarditis and pleurisy, following pneumonia in early 1990s.  5.  History of broken right mandible, status post surgical repair in 1986.   HISTORY OF PRESENT ILLNESS:  A 45 year old white male with no prior history  of CAD who was in his usual state of health until he awoke at 3:30 a.m. with  10/10 mid scapular sharp back pain which felt like a bad cramp associated  with mild shortness of breath (which lasted about 20 minutes) and radiated  to his neck, left elbow, and through to his anterior chest.  He was unable  to fall back to sleep secondary to continued chest, back, and neck pain  which was worsened by forward flexion of the neck and the left  sternocleidomastoid.  He saw Dr. Posey Rea at approximately 8 a.m. this  morning and an ECG was performed in the office which was normal.  He was  then sent to the ED for further evaluation.  He has continued to have  between 8 and 10 out of 10 chest heaviness and back pain that is worse with  flexion of his neck as described above.  To this point his cardiac markers  are negative x1.  His chest x-ray is normal.  His EKG is normal.   ALLERGIES:  IV CONTRAST.   CURRENT MEDICATIONS:  None.   FAMILY HISTORY:  His mother is age 52 with hyperlipidemia.  His father is  age 70 alive and well.  He has CAD on both sides in his grandparents, under  the age of 41.  He has one brother and one sister both are alive and well.   SOCIAL HISTORY:  He lives in [place] by himself.  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  He is single and has no children.  He does  not use tobacco or drug and has one drink every week or two weeks.  He does  not routinely exercise but does walk his dogs.   REVIEW OF SYSTEMS:  Positive for chest, neck, and back pain.  All the  systems reviewed and negative.   PHYSICAL EXAMINATION:  VITAL SIGNS:  Temperature 98.0, heart rate 83,  respirations 20, blood pressure 147/86, pulse ox 99% on room air.  GENERAL:  A pleasant white male in no acute distress.  Awake, alert, and  oriented x3.  NECK:  Normal carotid upstrokes.  No bruits or JVD.  LUNGS:  Respirations regular and unlabored.  Clear to auscultation.  CARDIAC:  Regular S1 S2.  No S3, S4, or murmur.  ABDOMEN:  Round, soft, nontender, nondistended.  Bowel sounds present x4.  EXTREMITIES:  Warm, dry, pink.  No clubbing, cyanosis, or edema.  Dorsalis  pedis posterior tibial pulses 2+ and equal bilaterally.  MUSCULOSKELETAL:  He has reproducible or worsened back, neck, and chest pain  with forward flexion of his neck, especially to the left.   A chest x-ray shows no active cardiopulmonary disease and normal  mediastinum.  EKG shows a sinus rhythm, no ST-T changes.  Lab work shows  hemoglobin 13.8, hematocrit 41.6, WBC 6.7, platelets 298.  Sodium 139,  potassium 4.3, chloride 107, CO2 28, BUN 12, creatinine 1.2, glucose 103.  Total bilirubin 0.8, alkaline phosphatase 58, AST 18, ALT 24, total protein  6.7, albumin 4.1.  CK-MB less than 1.0.  Troponin I less than 0.05.  PTT 28,  PT 12.8, INR 0.9.   ASSESSMENT:  Back, chest, and neck pain.  Cardiac markers are negative x1.  His chest x-ray is  normal.  His EKG is without acute changes.   PLAN:  1.  We will continue to cycle enzymes and also obtain an MRI/MRA of his      chest to rule out dissection.  We discussed this with Dr. Reche Dixon in      radiology who recommended MRI in this gentleman who has a dye allergy.  2.  We will treat his pain with IV Toradol and see if this helps any.  3.  If his next set of cardiac markers and MRI are normal, plan would be for      discharge from the ER, per the ER staff.      Ok Anis, NP      Arvilla Meres, M.D. [facility]  Electronically Signed    CRB/MEDQ  D:  09/10/2005  T:  09/12/2005  Job:  [id number]

## 2011-02-12 NOTE — Assessment & Plan Note (Signed)
[facility]                             PRIMARY CARE OFFICE NOTE   Mark Mcgee, Mark Mcgee                      MRN:          [id number]  DATE:04/06/2006                            DOB:          [birth year]    The patient is a 45 year old male who presents for a wellness examination.   PAST MEDICAL HISTORY, FAMILY HISTORY AND SOCIAL HISTORY:  As per August 16, 2003 note.  He does have a strong family history of heart disease.  Recently suffered cervical radiculopathy.   CURRENT MEDICINES:  1.  Wellbutrin 150 b.i.d.  2.  Benicar 40 mg one-half daily.   ALLERGIES:  IVP DYE.   REVIEW OF SYSTEMS:  Complains of spastic pain in his left lower quadrant  that may start suddenly and last for several hours to several days.  No  pattern.  He tried to follow a diary without any clear results.  Occasionally has heaviness in the hands at work.  Occasional problems with  neck pain and upper shoulder pain.  The rest is negative.   PHYSICAL EXAMINATION:  Blood pressure 121/72, pulse 76, temperature 99.4,  weight 200 pounds.  Looks well.  HEENT:  Moist mucosa.  NECK:  Supple.  No thyromegaly or bruit.  LUNGS:  Clear.  No wheezes or rales.  HEART:  S1 S2.  No murmur, no gallop.  ABDOMEN:  Soft, nontender.  No organomegaly, no masses felt.  Sensitive in  the left lower quadrant, no mass, no hernia.  LOWER EXTREMITIES:  Without edema.  SKIN:  With some moles on the back.   Labs on March 18, 2006 reviewed.  His B12 level was 281.   ASSESSMENT AND PLAN:  1.  Normal examination.  Age/health-related issues discussed.  Healthy      lifestyle discussed.  Repeat exam in 12 months with labs.  HIV test was      negative.  2.  Cervical pain with probable symptoms of radiculopathy.  Good ergonomics      at work.  Neurologic appointment is pending.  MRI reviewed.  Treatment      p.r.n.  3.  Borderline low B12.  He will start to take B12 one daily p.o.  4.  Possible  irritable bowel syndrome with left lower quadrant pain      recurring for a number of years.  Will do a trial of gluten-free diet      for four to six weeks.  Another option would be to do      Flagyl 200 mL p.o. q.i.d. for ten days with Flora-Q  for two months.      Avoid milk products.  Robinul 1 mg p.o. t.i.d. p.r.n.                                   Sonda Primes, MD   AP/MedQ  DD:  04/07/2006  DT:  04/07/2006  Job #:  [id number]

## 2011-02-23 ENCOUNTER — Other Ambulatory Visit (INDEPENDENT_AMBULATORY_CARE_PROVIDER_SITE_OTHER): Payer: BC Managed Care – PPO

## 2011-02-23 ENCOUNTER — Ambulatory Visit (INDEPENDENT_AMBULATORY_CARE_PROVIDER_SITE_OTHER): Payer: BC Managed Care – PPO | Admitting: Internal Medicine

## 2011-02-23 ENCOUNTER — Encounter: Payer: Self-pay | Admitting: Internal Medicine

## 2011-02-23 DIAGNOSIS — N410 Acute prostatitis: Secondary | ICD-10-CM

## 2011-02-23 DIAGNOSIS — E291 Testicular hypofunction: Secondary | ICD-10-CM

## 2011-02-23 DIAGNOSIS — F329 Major depressive disorder, single episode, unspecified: Secondary | ICD-10-CM

## 2011-02-23 DIAGNOSIS — R259 Unspecified abnormal involuntary movements: Secondary | ICD-10-CM

## 2011-02-23 DIAGNOSIS — F909 Attention-deficit hyperactivity disorder, unspecified type: Secondary | ICD-10-CM

## 2011-02-23 DIAGNOSIS — K589 Irritable bowel syndrome without diarrhea: Secondary | ICD-10-CM

## 2011-02-23 DIAGNOSIS — F410 Panic disorder [episodic paroxysmal anxiety] without agoraphobia: Secondary | ICD-10-CM

## 2011-02-23 LAB — COMPREHENSIVE METABOLIC PANEL
ALT: 65 U/L — ABNORMAL HIGH (ref 0–53)
CO2: 26 mEq/L (ref 19–32)
Calcium: 10.1 mg/dL (ref 8.4–10.5)
Chloride: 103 mEq/L (ref 96–112)
Creatinine, Ser: 1.2 mg/dL (ref 0.4–1.5)
GFR: 67.35 mL/min (ref >60.00)
Glucose, Bld: 106 mg/dL — ABNORMAL HIGH (ref 70–99)

## 2011-02-23 LAB — CBC WITH DIFFERENTIAL/PLATELET
Basophils Absolute: 0 10*3/uL (ref 0.0–0.1)
Eosinophils Absolute: 0.1 10*3/uL (ref 0.0–0.7)
Hemoglobin: 13.6 g/dL (ref 13.0–17.0)
Lymphocytes Relative: 26.2 % (ref 12.0–46.0)
MCHC: 32.9 g/dL (ref 30.0–36.0)
Monocytes Relative: 6.7 % (ref 3.0–12.0)
Neutrophils Relative %: 65 % (ref 43.0–77.0)
Platelets: 383 10*3/uL (ref 150.0–400.0)
RDW: 16.3 % — ABNORMAL HIGH (ref 11.5–14.6)

## 2011-02-23 MED ORDER — TESTOSTERONE 20.25 MG/ACT (1.62%) TD GEL: 2.0000 "application " | TRANSDERMAL | Status: DC

## 2011-02-23 MED ORDER — AMPHETAMINE-DEXTROAMPHETAMINE 10 MG PO TABS: 10.0000 mg | ORAL_TABLET | Freq: Two times a day (BID) | ORAL | Status: DC

## 2011-02-23 MED ORDER — HYOSCYAMINE SULFATE 0.125 MG PO TABS: 0.1250 mg | ORAL_TABLET | ORAL | Status: DC | PRN

## 2011-02-23 MED ORDER — GLYCOPYRROLATE 2 MG PO TABS: 2.0000 mg | ORAL_TABLET | Freq: Two times a day (BID) | ORAL | Status: DC

## 2011-02-23 NOTE — Assessment & Plan Note (Signed)
Better  

## 2011-02-23 NOTE — Assessment & Plan Note (Signed)
No change 

## 2011-02-23 NOTE — Assessment & Plan Note (Signed)
On Rx - better 

## 2011-02-23 NOTE — Assessment & Plan Note (Signed)
Better now 50% Cont Rx

## 2011-02-23 NOTE — Assessment & Plan Note (Signed)
Better - cont Rx 

## 2011-02-23 NOTE — Assessment & Plan Note (Signed)
Cont rx

## 2011-02-23 NOTE — Progress Notes (Signed)
  Subjective:    Patient ID: Mark Mcgee, male    DOB: [birth year], 45 y.o.   MRN: [id number]  HPI   The patient is here to follow up on chronic depression, anxiety, headaches and chronic moderate fibromyalgia and LBP symptoms controlled better with medicines, diet and exercise.   Review of Systems  Constitutional: Positive for diaphoresis and fatigue (sweats).  HENT: Negative for nosebleeds.   Eyes: Negative for pain.  Respiratory: Negative for cough.   Cardiovascular: Negative for chest pain.  Gastrointestinal: Positive for abdominal pain, diarrhea and abdominal distention. Negative for anal bleeding.  Neurological: Positive for tremors. Negative for syncope, speech difficulty and light-headedness.  Psychiatric/Behavioral: Positive for sleep disturbance and decreased concentration. Negative for suicidal ideas and dysphoric mood.       Objective:   Physical Exam  Constitutional: He is oriented to person, place, and time. He appears well-developed. No distress.  HENT:  Head: Atraumatic.  Mouth/Throat: Oropharynx is clear and moist. No oropharyngeal exudate.  Eyes: Conjunctivae are normal. Pupils are equal, round, and reactive to light.  Neck: Normal range of motion. No JVD present. No thyromegaly present.  Cardiovascular: Normal rate, regular rhythm, normal heart sounds and intact distal pulses.  Exam reveals no gallop and no friction rub.   No murmur heard. Pulmonary/Chest: Effort normal and breath sounds normal. No respiratory distress. He has no wheezes. He has no rales. He exhibits no tenderness.  Abdominal: Soft. Bowel sounds are normal. He exhibits no distension and no mass. There is no tenderness. There is no rebound and no guarding.  Musculoskeletal: Normal range of motion. He exhibits no edema and no tenderness.  Lymphadenopathy:    He has no cervical adenopathy.  Neurological: He is alert and oriented to person, place, and time. He has normal reflexes. No cranial nerve  deficit. He exhibits normal muscle tone. Coordination normal.  Skin: Skin is warm and dry. No rash noted. He is not diaphoretic.  Psychiatric: His behavior is normal. Judgment and thought content normal.       Tremor remained         Wt Readings from Last 3 Encounters:  02/23/11 231 lb (104.781 kg)  12/22/10 234 lb (106.142 kg)  12/04/10 243 lb (110.224 kg)     Assessment & Plan:  TREMOR No change  PANIC DISORDER Better - cont Rx  PROSTATITIS, ACUTE Better --  IRRITABLE BOWEL SYNDROME No change  Hypogonadism male On Rx - better  DEPRESSION Better now 50% Cont Rx  ADD (attention deficit disorder with hyperactivity) Cont rx

## 2011-02-24 ENCOUNTER — Telehealth: Payer: Self-pay | Admitting: Internal Medicine

## 2011-02-24 LAB — TSH: TSH: 1.91 u[IU]/mL (ref 0.35–5.50)

## 2011-02-24 LAB — VITAMIN B12: Vitamin B-12: 368 pg/mL (ref 211–911)

## 2011-02-24 NOTE — Telephone Encounter (Signed)
done

## 2011-02-24 NOTE — Telephone Encounter (Signed)
Please, mail the labs to the patient.     

## 2011-02-25 ENCOUNTER — Telehealth: Payer: Self-pay | Admitting: *Deleted

## 2011-02-25 NOTE — Telephone Encounter (Signed)
OK to fill both prescriptions with additional refills x1 Thank you!  

## 2011-02-25 NOTE — Telephone Encounter (Signed)
ALSO rec rf req for Alprazolam 2 mg 1 po tid # 90. Last filled 01-16-11. Ok to Rf?

## 2011-02-25 NOTE — Telephone Encounter (Signed)
rec rf rec for Hydroco/APAP 10-650 1 po qid prn pain, # 120. Last filled 01-21-11. Ok to Rf?

## 2011-02-26 MED ORDER — ALPRAZOLAM 2 MG PO TABS: 2.0000 mg | ORAL_TABLET | Freq: Three times a day (TID) | ORAL | Status: DC | PRN

## 2011-02-26 MED ORDER — HYDROCODONE-ACETAMINOPHEN 10-650 MG PO TABS: 1.0000 | ORAL_TABLET | Freq: Four times a day (QID) | ORAL | Status: DC | PRN

## 2011-05-18 ENCOUNTER — Telehealth: Payer: Self-pay | Admitting: *Deleted

## 2011-05-18 NOTE — Telephone Encounter (Signed)
Pt took double dose of welbutrin, abilify & ritalin by accident today. (took am and pm meds one hour apart) Should he be concerned?

## 2011-05-18 NOTE — Telephone Encounter (Signed)
Agree. Thx 

## 2011-05-18 NOTE — Telephone Encounter (Signed)
PER MD - No caffeine and no further meds, resume normal dosing tomorrow. Patient informed

## 2011-05-18 NOTE — Telephone Encounter (Signed)
OK to fill this prescription with additional refills x0 Thank you!  

## 2011-05-18 NOTE — Telephone Encounter (Signed)
Rf Alprazolam 2 mg 1 po tid # 90. Last filled 04-10-11. Ok to Rf?

## 2011-05-18 NOTE — Telephone Encounter (Signed)
Also, Rf req for Hydroc/APAP 10.650 1 po qid prn # 120. Last filled 04-10-11. Ok to Rf?

## 2011-05-19 MED ORDER — HYDROCODONE-ACETAMINOPHEN 10-650 MG PO TABS: 1.0000 | ORAL_TABLET | Freq: Four times a day (QID) | ORAL | Status: DC | PRN

## 2011-05-19 MED ORDER — ALPRAZOLAM 2 MG PO TABS: 2.0000 mg | ORAL_TABLET | Freq: Three times a day (TID) | ORAL | Status: DC | PRN

## 2011-05-26 ENCOUNTER — Encounter: Payer: Self-pay | Admitting: Internal Medicine

## 2011-05-26 ENCOUNTER — Ambulatory Visit (INDEPENDENT_AMBULATORY_CARE_PROVIDER_SITE_OTHER): Payer: BC Managed Care – PPO | Admitting: Internal Medicine

## 2011-05-26 DIAGNOSIS — M545 Low back pain, unspecified: Secondary | ICD-10-CM

## 2011-05-26 DIAGNOSIS — F329 Major depressive disorder, single episode, unspecified: Secondary | ICD-10-CM

## 2011-05-26 DIAGNOSIS — F909 Attention-deficit hyperactivity disorder, unspecified type: Secondary | ICD-10-CM

## 2011-05-26 DIAGNOSIS — F410 Panic disorder [episodic paroxysmal anxiety] without agoraphobia: Secondary | ICD-10-CM

## 2011-05-26 DIAGNOSIS — K589 Irritable bowel syndrome without diarrhea: Secondary | ICD-10-CM

## 2011-05-26 DIAGNOSIS — E291 Testicular hypofunction: Secondary | ICD-10-CM

## 2011-05-26 DIAGNOSIS — F3289 Other specified depressive episodes: Secondary | ICD-10-CM

## 2011-05-26 DIAGNOSIS — R159 Full incontinence of feces: Secondary | ICD-10-CM

## 2011-05-26 DIAGNOSIS — G4700 Insomnia, unspecified: Secondary | ICD-10-CM

## 2011-05-26 MED ORDER — GLYCOPYRROLATE 2 MG PO TABS: 2.0000 mg | ORAL_TABLET | Freq: Two times a day (BID) | ORAL | Status: DC

## 2011-05-26 MED ORDER — AMPHETAMINE-DEXTROAMPHETAMINE 10 MG PO TABS: 10.0000 mg | ORAL_TABLET | Freq: Two times a day (BID) | ORAL | Status: DC

## 2011-05-26 MED ORDER — ARIPIPRAZOLE 5 MG PO TABS: 5.0000 mg | ORAL_TABLET | Freq: Every day | ORAL | Status: DC

## 2011-05-26 NOTE — Assessment & Plan Note (Signed)
Better  

## 2011-05-26 NOTE — Assessment & Plan Note (Signed)
On Rx 

## 2011-05-26 NOTE — Assessment & Plan Note (Signed)
Welchol 

## 2011-05-26 NOTE — Assessment & Plan Note (Signed)
Try Welchol again

## 2011-05-26 NOTE — Assessment & Plan Note (Signed)
Better some

## 2011-05-26 NOTE — Progress Notes (Signed)
  Subjective:    Patient ID: Mark Mcgee, male    DOB: Nov 27, 1965, 45 y.o.   MRN: [id number]  HPI   The patient is here to follow up on chronic severe depression, anxiety, IBS-D with explosive pattern and chronic moderate fibromyalgia symptoms controlled better now with medicines, diet and exercise.   Review of Systems  Constitutional: Negative for appetite change, fatigue and unexpected weight change.  HENT: Negative for nosebleeds, congestion, sore throat, sneezing, trouble swallowing and neck pain.   Eyes: Negative for itching and visual disturbance.  Respiratory: Negative for cough.   Cardiovascular: Negative for chest pain, palpitations and leg swelling.  Gastrointestinal: Positive for diarrhea. Negative for nausea, blood in stool and abdominal distention.  Genitourinary: Negative for frequency and hematuria.  Musculoskeletal: Negative for back pain, joint swelling and gait problem.  Skin: Negative for rash.  Neurological: Positive for tremors. Negative for dizziness, speech difficulty and weakness.  Psychiatric/Behavioral: Positive for behavioral problems, sleep disturbance, dysphoric mood and decreased concentration. Negative for suicidal ideas and agitation. The patient is not nervous/anxious.        All better       Objective:   Physical Exam  Constitutional: He is oriented to person, place, and time. He appears well-developed.       Obese NAD  HENT:  Mouth/Throat: Oropharynx is clear and moist.  Eyes: Conjunctivae are normal. Pupils are equal, round, and reactive to light.  Neck: Normal range of motion. No JVD present. No thyromegaly present.  Cardiovascular: Normal rate, regular rhythm, normal heart sounds and intact distal pulses.  Exam reveals no gallop and no friction rub.   No murmur heard. Pulmonary/Chest: Effort normal and breath sounds normal. No respiratory distress. He has no wheezes. He has no rales. He exhibits no tenderness.  Abdominal: Soft. Bowel  sounds are normal. He exhibits no distension and no mass. There is no tenderness. There is no rebound and no guarding.  Musculoskeletal: Normal range of motion. He exhibits no edema and no tenderness.  Lymphadenopathy:    He has no cervical adenopathy.  Neurological: He is alert and oriented to person, place, and time. He has normal reflexes. No cranial nerve deficit. He exhibits normal muscle tone. Coordination normal.  Skin: Skin is warm and dry. No rash noted.  Psychiatric: He has a normal mood and affect. His behavior is normal. Judgment and thought content normal.          Assessment & Plan:

## 2011-05-26 NOTE — Assessment & Plan Note (Signed)
On RX 

## 2011-07-15 ENCOUNTER — Telehealth: Payer: Self-pay | Admitting: *Deleted

## 2011-07-15 NOTE — Telephone Encounter (Signed)
Rf req for hydroco/apap 10-650 #120. Last filled 05-19-11 AND  Alprazolam 2 mg # 90. Last filled 05-19-11.     Ok to Rf?

## 2011-07-16 MED ORDER — ALPRAZOLAM 2 MG PO TABS: 2.0000 mg | ORAL_TABLET | Freq: Three times a day (TID) | ORAL | Status: DC | PRN

## 2011-07-16 MED ORDER — HYDROCODONE-ACETAMINOPHEN 10-650 MG PO TABS: 1.0000 | ORAL_TABLET | Freq: Four times a day (QID) | ORAL | Status: DC | PRN

## 2011-07-16 NOTE — Telephone Encounter (Signed)
Pharmacy notified.

## 2011-07-16 NOTE — Telephone Encounter (Signed)
yes

## 2011-07-21 ENCOUNTER — Other Ambulatory Visit (INDEPENDENT_AMBULATORY_CARE_PROVIDER_SITE_OTHER): Payer: BC Managed Care – PPO

## 2011-07-21 DIAGNOSIS — E291 Testicular hypofunction: Secondary | ICD-10-CM

## 2011-07-21 DIAGNOSIS — F329 Major depressive disorder, single episode, unspecified: Secondary | ICD-10-CM

## 2011-07-21 DIAGNOSIS — K589 Irritable bowel syndrome without diarrhea: Secondary | ICD-10-CM

## 2011-07-21 LAB — COMPREHENSIVE METABOLIC PANEL
BUN: 19 mg/dL (ref 6–23)
CO2: 28 mEq/L (ref 19–32)
Creatinine, Ser: 1 mg/dL (ref 0.4–1.5)
GFR: 89.24 mL/min (ref >60.00)
Glucose, Bld: 91 mg/dL (ref 70–99)
Sodium: 141 mEq/L (ref 135–145)
Total Bilirubin: 0.9 mg/dL (ref 0.3–1.2)
Total Protein: 7.4 g/dL (ref 6.0–8.3)

## 2011-07-21 LAB — CBC WITH DIFFERENTIAL/PLATELET
Eosinophils Relative: 0.9 % (ref 0.0–5.0)
HCT: 43.3 % (ref 39.0–52.0)
Lymphs Abs: 1.7 10*3/uL (ref 0.7–4.0)
Monocytes Relative: 4.4 % (ref 3.0–12.0)
Platelets: 342 10*3/uL (ref 150.0–400.0)
WBC: 10.9 10*3/uL — ABNORMAL HIGH (ref 4.5–10.5)

## 2011-07-21 LAB — LIPID PANEL
Cholesterol: 174 mg/dL (ref 0–200)
LDL Cholesterol: 106 mg/dL — ABNORMAL HIGH (ref 0–99)
Triglycerides: 100 mg/dL (ref 0.0–149.0)
VLDL: 20 mg/dL (ref 0.0–40.0)

## 2011-07-27 ENCOUNTER — Ambulatory Visit: Payer: BC Managed Care – PPO | Admitting: Internal Medicine

## 2011-07-27 DIAGNOSIS — Z0289 Encounter for other administrative examinations: Secondary | ICD-10-CM

## 2011-08-13 ENCOUNTER — Encounter: Payer: Self-pay | Admitting: Internal Medicine

## 2011-08-13 ENCOUNTER — Ambulatory Visit (INDEPENDENT_AMBULATORY_CARE_PROVIDER_SITE_OTHER): Payer: BC Managed Care – PPO | Admitting: Internal Medicine

## 2011-08-13 VITALS — BP 120/68 | HR 72 | Temp 98.2°F | Resp 16 | Wt 216.0 lb

## 2011-08-13 DIAGNOSIS — I10 Essential (primary) hypertension: Secondary | ICD-10-CM

## 2011-08-13 DIAGNOSIS — E291 Testicular hypofunction: Secondary | ICD-10-CM

## 2011-08-13 DIAGNOSIS — K589 Irritable bowel syndrome without diarrhea: Secondary | ICD-10-CM

## 2011-08-13 DIAGNOSIS — F410 Panic disorder [episodic paroxysmal anxiety] without agoraphobia: Secondary | ICD-10-CM

## 2011-08-13 DIAGNOSIS — F909 Attention-deficit hyperactivity disorder, unspecified type: Secondary | ICD-10-CM

## 2011-08-13 MED ORDER — MELOXICAM 15 MG PO TABS: 15.0000 mg | ORAL_TABLET | Freq: Every day | ORAL | Status: DC

## 2011-08-13 MED ORDER — TESTOSTERONE 20.25 MG/ACT (1.62%) TD GEL: 4.0000 "application " | TRANSDERMAL | Status: DC

## 2011-08-13 MED ORDER — AMPHETAMINE-DEXTROAMPHETAMINE 10 MG PO TABS: 10.0000 mg | ORAL_TABLET | Freq: Two times a day (BID) | ORAL | Status: DC

## 2011-08-13 NOTE — Assessment & Plan Note (Signed)
Continue with current prescription therapy as reflected on the Med list.  

## 2011-08-13 NOTE — Assessment & Plan Note (Signed)
Not better 

## 2011-08-13 NOTE — Assessment & Plan Note (Signed)
Doing better.   

## 2011-08-13 NOTE — Assessment & Plan Note (Signed)
He started 1/2 tab procardia a day

## 2011-08-13 NOTE — Progress Notes (Signed)
  Subjective:    Patient ID: Mark Mcgee, male    DOB: 12-May-1966, 45 y.o.   MRN: [id number]  HPI   The patient is here to follow up on chronic depression, anxiety, IBS and chronic moderate fibromyalgia symptoms, CFS controlled with medicines, diet and exercise. Changed his job position - a lot better  Review of Systems  Constitutional: Positive for fatigue (better). Negative for appetite change and unexpected weight change.  HENT: Negative for nosebleeds, congestion, sore throat, sneezing, trouble swallowing and neck pain.   Eyes: Negative for itching and visual disturbance.  Respiratory: Negative for cough.   Cardiovascular: Negative for chest pain, palpitations and leg swelling.  Gastrointestinal: Positive for abdominal pain, diarrhea and abdominal distention. Negative for nausea and blood in stool.  Genitourinary: Negative for frequency and hematuria.  Musculoskeletal: Negative for back pain, joint swelling and gait problem.  Skin: Negative for rash.  Neurological: Negative for dizziness, tremors, speech difficulty and weakness.  Psychiatric/Behavioral: Negative for sleep disturbance, dysphoric mood and agitation. The patient is not nervous/anxious.    Wt Readings from Last 3 Encounters:  08/13/11 216 lb (97.977 kg)  05/26/11 219 lb 8 oz (99.565 kg)  02/23/11 231 lb (104.781 kg)   BP Readings from Last 3 Encounters:  08/13/11 120/68  05/26/11 140/106  02/23/11 140/90       Objective:   Physical Exam  Constitutional: He is oriented to person, place, and time. He appears well-developed.       Obese   HENT:  Mouth/Throat: Oropharynx is clear and moist.  Eyes: Conjunctivae are normal. Pupils are equal, round, and reactive to light.  Neck: Normal range of motion. No JVD present. No thyromegaly present.  Cardiovascular: Normal rate, regular rhythm, normal heart sounds and intact distal pulses.  Exam reveals no gallop and no friction rub.   No murmur  heard. Pulmonary/Chest: Effort normal and breath sounds normal. No respiratory distress. He has no wheezes. He has no rales. He exhibits no tenderness.  Abdominal: Soft. Bowel sounds are normal. He exhibits no distension and no mass. There is no tenderness. There is no rebound and no guarding.  Musculoskeletal: Normal range of motion. He exhibits no edema and no tenderness.  Lymphadenopathy:    He has no cervical adenopathy.  Neurological: He is alert and oriented to person, place, and time. He has normal reflexes. No cranial nerve deficit. He exhibits normal muscle tone. Coordination normal.  Skin: Skin is warm and dry. No rash noted.  Psychiatric: He has a normal mood and affect. His behavior is normal. Judgment and thought content normal.   Lab Results  Component Value Date   WBC 10.9* 07/21/2011   HGB 14.0 07/21/2011   HCT 43.3 07/21/2011   PLT 342.0 07/21/2011   GLUCOSE 91 07/21/2011   CHOL 174 07/21/2011   TRIG 100.0 07/21/2011   HDL 48.10 07/21/2011   LDLCALC 106* 07/21/2011   ALT 34 07/21/2011   AST 18 07/21/2011   NA 141 07/21/2011   K 4.8 07/21/2011   CL 105 07/21/2011   CREATININE 1.0 07/21/2011   BUN 19 07/21/2011   CO2 28 07/21/2011   TSH 1.76 07/21/2011   PSA 0.81 04/11/2007          Assessment & Plan:

## 2011-08-23 ENCOUNTER — Telehealth: Payer: Self-pay | Admitting: Internal Medicine

## 2011-08-23 DIAGNOSIS — N400 Enlarged prostate without lower urinary tract symptoms: Secondary | ICD-10-CM | POA: Insufficient documentation

## 2011-08-23 MED ORDER — TADALAFIL 5 MG PO TABS: 5.0000 mg | ORAL_TABLET | Freq: Every day | ORAL | Status: DC

## 2011-08-23 NOTE — Telephone Encounter (Signed)
Needs Cialis for BPH and ED 5 mg qd Done

## 2011-08-24 ENCOUNTER — Other Ambulatory Visit: Payer: Self-pay | Admitting: Internal Medicine

## 2011-08-24 NOTE — Telephone Encounter (Signed)
Request clarification on Testosterone/Androgel instructions. Caller informed Pt's dosage increased at 11.16.12 OV due to lab results.

## 2011-11-08 ENCOUNTER — Other Ambulatory Visit: Payer: Self-pay | Admitting: *Deleted

## 2011-11-08 MED ORDER — BUPROPION HCL ER (SR) 150 MG PO TB12: 150.0000 mg | ORAL_TABLET | Freq: Three times a day (TID) | ORAL | Status: DC

## 2011-11-15 ENCOUNTER — Ambulatory Visit (INDEPENDENT_AMBULATORY_CARE_PROVIDER_SITE_OTHER): Payer: BC Managed Care – PPO | Admitting: Internal Medicine

## 2011-11-15 ENCOUNTER — Other Ambulatory Visit (INDEPENDENT_AMBULATORY_CARE_PROVIDER_SITE_OTHER): Payer: BC Managed Care – PPO

## 2011-11-15 ENCOUNTER — Encounter: Payer: Self-pay | Admitting: Internal Medicine

## 2011-11-15 VITALS — BP 148/98 | HR 71 | Temp 98.3°F | Resp 16 | Wt 210.0 lb

## 2011-11-15 DIAGNOSIS — F329 Major depressive disorder, single episode, unspecified: Secondary | ICD-10-CM

## 2011-11-15 DIAGNOSIS — K589 Irritable bowel syndrome without diarrhea: Secondary | ICD-10-CM

## 2011-11-15 DIAGNOSIS — F410 Panic disorder [episodic paroxysmal anxiety] without agoraphobia: Secondary | ICD-10-CM

## 2011-11-15 DIAGNOSIS — M545 Low back pain, unspecified: Secondary | ICD-10-CM

## 2011-11-15 DIAGNOSIS — I10 Essential (primary) hypertension: Secondary | ICD-10-CM

## 2011-11-15 DIAGNOSIS — E291 Testicular hypofunction: Secondary | ICD-10-CM

## 2011-11-15 DIAGNOSIS — R894 Abnormal immunological findings in specimens from other organs, systems and tissues: Secondary | ICD-10-CM

## 2011-11-15 DIAGNOSIS — F909 Attention-deficit hyperactivity disorder, unspecified type: Secondary | ICD-10-CM

## 2011-11-15 DIAGNOSIS — R259 Unspecified abnormal involuntary movements: Secondary | ICD-10-CM

## 2011-11-15 LAB — HEPATIC FUNCTION PANEL
ALT: 60 U/L — ABNORMAL HIGH (ref 0–53)
AST: 31 U/L (ref 0–37)
Albumin: 4.6 g/dL (ref 3.5–5.2)
Alkaline Phosphatase: 64 U/L (ref 39–117)

## 2011-11-15 LAB — BASIC METABOLIC PANEL
BUN: 18 mg/dL (ref 6–23)
CO2: 27 mEq/L (ref 19–32)
Chloride: 104 mEq/L (ref 96–112)
Glucose, Bld: 90 mg/dL (ref 70–99)
Potassium: 4 mEq/L (ref 3.5–5.1)
Sodium: 140 mEq/L (ref 135–145)

## 2011-11-15 MED ORDER — AMLODIPINE BESYLATE 5 MG PO TABS: 5.0000 mg | ORAL_TABLET | Freq: Every day | ORAL | Status: DC

## 2011-11-15 MED ORDER — AMPHETAMINE-DEXTROAMPHETAMINE 10 MG PO TABS: 10.0000 mg | ORAL_TABLET | Freq: Two times a day (BID) | ORAL | Status: DC

## 2011-11-15 MED ORDER — TADALAFIL 5 MG PO TABS: 5.0000 mg | ORAL_TABLET | Freq: Every day | ORAL | Status: DC

## 2011-11-15 NOTE — Assessment & Plan Note (Signed)
Chronic  FMLA filled out

## 2011-11-15 NOTE — Assessment & Plan Note (Signed)
Continue with current prescription therapy as reflected on the Med list.  

## 2011-11-15 NOTE — Assessment & Plan Note (Signed)
Better  Continue with current prescription therapy prn as reflected on the Med list.

## 2011-11-15 NOTE — Assessment & Plan Note (Signed)
Better Continue with current prescription therapy as reflected on the Med list.  

## 2011-11-15 NOTE — Assessment & Plan Note (Signed)
HIV AB false pos and [place] blot neg

## 2011-11-15 NOTE — Assessment & Plan Note (Signed)
resolved 

## 2011-11-15 NOTE — Progress Notes (Signed)
Patient ID: Mark Mcgee, male   DOB: [birth year], 46 y.o.   MRN: [id number]  Subjective:    Patient ID: Mark Mcgee, male    DOB: [birth year], 46 y.o.   MRN: [id number]  HPI   The patient is here to follow up on chronic depression, anxiety, IBS and chronic moderate fibromyalgia symptoms, CFS controlled with medicines, diet and exercise. Changed his job position - a lot better  Review of Systems  Constitutional: Positive for fatigue (better). Negative for appetite change and unexpected weight change.  HENT: Negative for nosebleeds, congestion, sore throat, sneezing, trouble swallowing and neck pain.   Eyes: Negative for itching and visual disturbance.  Respiratory: Negative for cough.   Cardiovascular: Negative for chest pain, palpitations and leg swelling.  Gastrointestinal: Positive for abdominal pain, diarrhea and abdominal distention. Negative for nausea and blood in stool.  Genitourinary: Negative for frequency and hematuria.  Musculoskeletal: Negative for back pain, joint swelling and gait problem.  Skin: Negative for rash.  Neurological: Negative for dizziness, tremors, speech difficulty and weakness.  Psychiatric/Behavioral: Negative for sleep disturbance, dysphoric mood and agitation. The patient is not nervous/anxious.    Wt Readings from Last 3 Encounters:  11/15/11 210 lb (95.255 kg)  08/13/11 216 lb (97.977 kg)  05/26/11 219 lb 8 oz (99.565 kg)   BP Readings from Last 3 Encounters:  11/15/11 148/98  08/13/11 120/68  05/26/11 140/106       Objective:   Physical Exam  Constitutional: He is oriented to person, place, and time. He appears well-developed.       Obese   HENT:  Mouth/Throat: Oropharynx is clear and moist.  Eyes: Conjunctivae are normal. Pupils are equal, round, and reactive to light.  Neck: Normal range of motion. No JVD present. No thyromegaly present.  Cardiovascular: Normal rate, regular rhythm, normal heart sounds and intact distal pulses.   Exam reveals no gallop and no friction rub.   No murmur heard. Pulmonary/Chest: Effort normal and breath sounds normal. No respiratory distress. He has no wheezes. He has no rales. He exhibits no tenderness.  Abdominal: Soft. Bowel sounds are normal. He exhibits no distension and no mass. There is no tenderness. There is no rebound and no guarding.  Musculoskeletal: Normal range of motion. He exhibits no edema and no tenderness.  Lymphadenopathy:    He has no cervical adenopathy.  Neurological: He is alert and oriented to person, place, and time. He has normal reflexes. No cranial nerve deficit. He exhibits normal muscle tone. Coordination normal.  Skin: Skin is warm and dry. No rash noted.  Psychiatric: He has a normal mood and affect. His behavior is normal. Judgment and thought content normal.   Lab Results  Component Value Date   WBC 10.9* 07/21/2011   HGB 14.0 07/21/2011   HCT 43.3 07/21/2011   PLT 342.0 07/21/2011   GLUCOSE 91 07/21/2011   CHOL 174 07/21/2011   TRIG 100.0 07/21/2011   HDL 48.10 07/21/2011   LDLCALC 106* 07/21/2011   ALT 34 07/21/2011   AST 18 07/21/2011   NA 141 07/21/2011   K 4.8 07/21/2011   CL 105 07/21/2011   CREATININE 1.0 07/21/2011   BUN 19 07/21/2011   CO2 28 07/21/2011   TSH 1.76 07/21/2011   PSA 0.81 04/11/2007          Assessment & Plan:

## 2011-11-15 NOTE — Assessment & Plan Note (Signed)
Better  

## 2011-11-15 NOTE — Assessment & Plan Note (Signed)
Start Norvasc 

## 2011-11-16 ENCOUNTER — Telehealth: Payer: Self-pay | Admitting: *Deleted

## 2011-11-16 NOTE — Telephone Encounter (Signed)
Left detailed mess informing pt that his Reasonable Accommodation Questionnaire form is ready for p/u at our front desk.

## 2011-11-17 ENCOUNTER — Telehealth: Payer: Self-pay

## 2011-11-17 MED ORDER — ALPRAZOLAM 2 MG PO TABS: 2.0000 mg | ORAL_TABLET | Freq: Three times a day (TID) | ORAL | Status: DC | PRN

## 2011-11-17 MED ORDER — HYDROCODONE-ACETAMINOPHEN 10-650 MG PO TABS: 1.0000 | ORAL_TABLET | Freq: Four times a day (QID) | ORAL | Status: DC | PRN

## 2011-11-17 NOTE — Telephone Encounter (Signed)
Request refill of Xanax 2 mg tab 1 po tid prn and hydrocodone APAP 10-650 1 po qid prn. Please advise.

## 2011-11-17 NOTE — Telephone Encounter (Signed)
OK to fill this prescription with additional refills x2 - both Thank you!

## 2011-11-18 LAB — HIV 1/2 CONFIRMATION: HIV-2 Ab: NEGATIVE

## 2011-11-19 ENCOUNTER — Telehealth: Payer: Self-pay | Admitting: Internal Medicine

## 2011-11-19 NOTE — Telephone Encounter (Signed)
Please, mail the labs to the patient.     

## 2011-11-22 NOTE — Telephone Encounter (Signed)
Done

## 2011-11-24 ENCOUNTER — Other Ambulatory Visit: Payer: Self-pay | Admitting: *Deleted

## 2011-11-24 MED ORDER — ARIPIPRAZOLE 5 MG PO TABS: 5.0000 mg | ORAL_TABLET | Freq: Every day | ORAL | Status: DC

## 2012-01-28 ENCOUNTER — Other Ambulatory Visit: Payer: Self-pay | Admitting: *Deleted

## 2012-01-28 MED ORDER — AMLODIPINE BESYLATE 5 MG PO TABS: 5.0000 mg | ORAL_TABLET | Freq: Every day | ORAL | Status: DC

## 2012-02-14 ENCOUNTER — Ambulatory Visit (INDEPENDENT_AMBULATORY_CARE_PROVIDER_SITE_OTHER): Payer: BC Managed Care – PPO | Admitting: Internal Medicine

## 2012-02-14 ENCOUNTER — Encounter: Payer: Self-pay | Admitting: Internal Medicine

## 2012-02-14 VITALS — BP 158/98 | HR 80 | Temp 98.5°F | Resp 16 | Wt 214.0 lb

## 2012-02-14 DIAGNOSIS — F411 Generalized anxiety disorder: Secondary | ICD-10-CM

## 2012-02-14 DIAGNOSIS — F329 Major depressive disorder, single episode, unspecified: Secondary | ICD-10-CM

## 2012-02-14 DIAGNOSIS — F909 Attention-deficit hyperactivity disorder, unspecified type: Secondary | ICD-10-CM

## 2012-02-14 DIAGNOSIS — M545 Low back pain, unspecified: Secondary | ICD-10-CM

## 2012-02-14 DIAGNOSIS — R197 Diarrhea, unspecified: Secondary | ICD-10-CM

## 2012-02-14 DIAGNOSIS — I10 Essential (primary) hypertension: Secondary | ICD-10-CM

## 2012-02-14 DIAGNOSIS — F3289 Other specified depressive episodes: Secondary | ICD-10-CM

## 2012-02-14 DIAGNOSIS — N410 Acute prostatitis: Secondary | ICD-10-CM

## 2012-02-14 MED ORDER — COLESEVELAM HCL 3.75 G PO PACK: PACK | ORAL | Status: DC

## 2012-02-14 MED ORDER — HYDROXYZINE HCL 50 MG PO TABS: 50.0000 mg | ORAL_TABLET | Freq: Two times a day (BID) | ORAL | Status: DC | PRN

## 2012-02-14 MED ORDER — AMPHETAMINE-DEXTROAMPHETAMINE 10 MG PO TABS: 10.0000 mg | ORAL_TABLET | Freq: Two times a day (BID) | ORAL | Status: DC

## 2012-02-14 MED ORDER — TESTOSTERONE 20.25 MG/ACT (1.62%) TD GEL: 4.0000 "application " | TRANSDERMAL | Status: DC

## 2012-02-14 MED ORDER — HYOSCYAMINE SULFATE 0.125 MG PO TABS: 0.1250 mg | ORAL_TABLET | ORAL | Status: DC | PRN

## 2012-02-14 MED ORDER — ARIPIPRAZOLE 5 MG PO TABS: 5.0000 mg | ORAL_TABLET | Freq: Every day | ORAL | Status: DC

## 2012-02-14 MED ORDER — AMLODIPINE BESYLATE 5 MG PO TABS: 5.0000 mg | ORAL_TABLET | Freq: Every day | ORAL | Status: DC

## 2012-02-14 MED ORDER — MELOXICAM 15 MG PO TABS: 15.0000 mg | ORAL_TABLET | Freq: Every day | ORAL | Status: DC

## 2012-02-14 MED ORDER — ALPRAZOLAM 2 MG PO TABS: 2.0000 mg | ORAL_TABLET | Freq: Three times a day (TID) | ORAL | Status: DC | PRN

## 2012-02-14 MED ORDER — TADALAFIL 5 MG PO TABS: 5.0000 mg | ORAL_TABLET | Freq: Every day | ORAL | Status: DC

## 2012-02-14 MED ORDER — HYDROCODONE-ACETAMINOPHEN 10-650 MG PO TABS: 1.0000 | ORAL_TABLET | Freq: Four times a day (QID) | ORAL | Status: DC | PRN

## 2012-02-14 MED ORDER — BUPROPION HCL ER (SR) 150 MG PO TB12: 150.0000 mg | ORAL_TABLET | Freq: Three times a day (TID) | ORAL | Status: DC

## 2012-02-14 NOTE — Progress Notes (Signed)
Patient ID: Mark Mcgee, male   DOB: May 23, 1966, 46 y.o.   MRN: [id number] Patient ID: Mark Mcgee, male   DOB: January 02, 1966, 46 y.o.   MRN: [id number]  Subjective:    Patient ID: Mark Mcgee, male    DOB: 1965-12-11, 46 y.o.   MRN: [id number]  HPI   The patient is here to follow up on chronic depression, anxiety, IBS and chronic moderate fibromyalgia symptoms, CFS controlled with medicines, diet and exercise. Changed his job, now working for [employer]; he sold his house - feeling a lot better   Review of Systems  Constitutional: Positive for fatigue (better). Negative for appetite change and unexpected weight change.  HENT: Negative for nosebleeds, congestion, sore throat, sneezing, trouble swallowing and neck pain.   Eyes: Negative for itching and visual disturbance.  Respiratory: Negative for cough.   Cardiovascular: Negative for chest pain, palpitations and leg swelling.  Gastrointestinal: Positive for abdominal pain, diarrhea and abdominal distention. Negative for nausea and blood in stool.  Genitourinary: Negative for frequency and hematuria.  Musculoskeletal: Negative for back pain, joint swelling and gait problem.  Skin: Negative for rash.  Neurological: Negative for dizziness, tremors, speech difficulty and weakness.  Psychiatric/Behavioral: Negative for sleep disturbance, dysphoric mood and agitation. The patient is not nervous/anxious.    Wt Readings from Last 3 Encounters:  02/14/12 214 lb (97.07 kg)  11/15/11 210 lb (95.255 kg)  08/13/11 216 lb (97.977 kg)   BP Readings from Last 3 Encounters:  02/14/12 158/98  11/15/11 148/98  08/13/11 120/68       Objective:   Physical Exam  Constitutional: He is oriented to person, place, and time. He appears well-developed.       Obese   HENT:  Mouth/Throat: Oropharynx is clear and moist.  Eyes: Conjunctivae are normal. Pupils are equal, round, and reactive to light.  Neck: Normal range of motion. No JVD present. No  thyromegaly present.  Cardiovascular: Normal rate, regular rhythm, normal heart sounds and intact distal pulses.  Exam reveals no gallop and no friction rub.   No murmur heard. Pulmonary/Chest: Effort normal and breath sounds normal. No respiratory distress. He has no wheezes. He has no rales. He exhibits no tenderness.  Abdominal: Soft. Bowel sounds are normal. He exhibits no distension and no mass. There is no tenderness. There is no rebound and no guarding.  Musculoskeletal: Normal range of motion. He exhibits no edema and no tenderness.  Lymphadenopathy:    He has no cervical adenopathy.  Neurological: He is alert and oriented to person, place, and time. He has normal reflexes. No cranial nerve deficit. He exhibits normal muscle tone. Coordination normal.  Skin: Skin is warm and dry. No rash noted.  Psychiatric: He has a normal mood and affect. His behavior is normal. Judgment and thought content normal.   Lab Results  Component Value Date   WBC 10.9* 07/21/2011   HGB 14.0 07/21/2011   HCT 43.3 07/21/2011   PLT 342.0 07/21/2011   GLUCOSE 90 11/15/2011   CHOL 174 07/21/2011   TRIG 100.0 07/21/2011   HDL 48.10 07/21/2011   LDLCALC 106* 07/21/2011   ALT 60* 11/15/2011   AST 31 11/15/2011   NA 140 11/15/2011   K 4.0 11/15/2011   CL 104 11/15/2011   CREATININE 1.0 11/15/2011   BUN 18 11/15/2011   CO2 27 11/15/2011   TSH 1.76 07/21/2011   PSA 0.81 04/11/2007          Assessment & Plan:

## 2012-02-14 NOTE — Assessment & Plan Note (Addendum)
Overall better Continue with current prescription therapy as reflected on the Med list.  

## 2012-02-14 NOTE — Assessment & Plan Note (Signed)
Better  

## 2012-02-14 NOTE — Assessment & Plan Note (Signed)
Continue with current prescription therapy as reflected on the Med list.  

## 2012-02-14 NOTE — Assessment & Plan Note (Signed)
Better now 

## 2012-02-14 NOTE — Assessment & Plan Note (Signed)
Relapsing  IBS-d Better

## 2012-04-14 ENCOUNTER — Telehealth: Payer: Self-pay | Admitting: *Deleted

## 2012-04-14 NOTE — Telephone Encounter (Signed)
PA for Androgel is approved from 04/12/2012-04/12/2013. Pharmacy informed. Left detailed mess informing pt.

## 2012-05-22 ENCOUNTER — Encounter: Payer: Self-pay | Admitting: Internal Medicine

## 2012-05-22 ENCOUNTER — Ambulatory Visit (INDEPENDENT_AMBULATORY_CARE_PROVIDER_SITE_OTHER): Payer: Managed Care, Other (non HMO) | Admitting: Internal Medicine

## 2012-05-22 VITALS — BP 122/90 | HR 74 | Temp 98.3°F | Wt 219.0 lb

## 2012-05-22 DIAGNOSIS — I10 Essential (primary) hypertension: Secondary | ICD-10-CM

## 2012-05-22 DIAGNOSIS — F411 Generalized anxiety disorder: Secondary | ICD-10-CM

## 2012-05-22 DIAGNOSIS — J069 Acute upper respiratory infection, unspecified: Secondary | ICD-10-CM

## 2012-05-22 DIAGNOSIS — E291 Testicular hypofunction: Secondary | ICD-10-CM

## 2012-05-22 MED ORDER — AMPHETAMINE-DEXTROAMPHETAMINE 10 MG PO TABS: 10.0000 mg | ORAL_TABLET | Freq: Two times a day (BID) | ORAL | Status: DC

## 2012-05-22 MED ORDER — PROMETHAZINE-CODEINE 6.25-10 MG/5ML PO SYRP: 5.0000 mL | ORAL_SOLUTION | ORAL | Status: AC | PRN

## 2012-05-22 MED ORDER — AZITHROMYCIN 250 MG PO TABS: ORAL_TABLET | ORAL | Status: AC

## 2012-05-22 NOTE — Assessment & Plan Note (Signed)
Continue with current prescription therapy as reflected on the Med list.  

## 2012-05-22 NOTE — Progress Notes (Signed)
   Subjective:    Patient ID: Mark Mcgee, male    DOB: 04-10-1966, 46 y.o.   MRN: [id number]  HPI   The patient is here to follow up on chronic depression, anxiety, IBS and chronic moderate fibromyalgia symptoms, CFS controlled with medicines, diet and exercise. Changed his job, now working for [employer]; he is in a new house (renting) - feeling a lot better C/o cough - brown sputum, hoarse, chest is tight x 4-5 d - worse   Review of Systems  Constitutional: Positive for fatigue (better). Negative for appetite change and unexpected weight change.  HENT: Negative for nosebleeds, congestion, sore throat, sneezing, trouble swallowing and neck pain.   Eyes: Negative for itching and visual disturbance.  Respiratory: Negative for cough.   Cardiovascular: Negative for chest pain, palpitations and leg swelling.  Gastrointestinal: Positive for abdominal pain, diarrhea and abdominal distention. Negative for nausea and blood in stool.  Genitourinary: Negative for frequency and hematuria.  Musculoskeletal: Negative for back pain, joint swelling and gait problem.  Skin: Negative for rash.  Neurological: Negative for dizziness, tremors, speech difficulty and weakness.  Psychiatric/Behavioral: Negative for disturbed wake/sleep cycle, dysphoric mood and agitation. The patient is not nervous/anxious.    Wt Readings from Last 3 Encounters:  05/22/12 219 lb (99.338 kg)  02/14/12 214 lb (97.07 kg)  11/15/11 210 lb (95.255 kg)   BP Readings from Last 3 Encounters:  05/22/12 122/90  02/14/12 158/98  11/15/11 148/98       Objective:   Physical Exam  Constitutional: He is oriented to person, place, and time. He appears well-developed.       Obese   HENT:  Mouth/Throat: Oropharynx is clear and moist.  Eyes: Conjunctivae are normal. Pupils are equal, round, and reactive to light.  Neck: Normal range of motion. No JVD present. No thyromegaly present.  Cardiovascular: Normal rate, regular rhythm,  normal heart sounds and intact distal pulses.  Exam reveals no gallop and no friction rub.   No murmur heard. Pulmonary/Chest: Effort normal and breath sounds normal. No respiratory distress. He has no wheezes. He has no rales. He exhibits no tenderness.  Abdominal: Soft. Bowel sounds are normal. He exhibits no distension and no mass. There is no tenderness. There is no rebound and no guarding.  Musculoskeletal: Normal range of motion. He exhibits no edema and no tenderness.  Lymphadenopathy:    He has no cervical adenopathy.  Neurological: He is alert and oriented to person, place, and time. He has normal reflexes. No cranial nerve deficit. He exhibits normal muscle tone. Coordination normal.  Skin: Skin is warm and dry. No rash noted.  Psychiatric: He has a normal mood and affect. His behavior is normal. Judgment and thought content normal.   Lab Results  Component Value Date   WBC 10.9* 07/21/2011   HGB 14.0 07/21/2011   HCT 43.3 07/21/2011   PLT 342.0 07/21/2011   GLUCOSE 90 11/15/2011   CHOL 174 07/21/2011   TRIG 100.0 07/21/2011   HDL 48.10 07/21/2011   LDLCALC 106* 07/21/2011   ALT 60* 11/15/2011   AST 31 11/15/2011   NA 140 11/15/2011   K 4.0 11/15/2011   CL 104 11/15/2011   CREATININE 1.0 11/15/2011   BUN 18 11/15/2011   CO2 27 11/15/2011   TSH 1.76 07/21/2011   PSA 0.81 04/11/2007          Assessment & Plan:

## 2012-05-23 ENCOUNTER — Telehealth: Payer: Self-pay | Admitting: *Deleted

## 2012-05-23 MED ORDER — HYDROCODONE-ACETAMINOPHEN 10-650 MG PO TABS: 1.0000 | ORAL_TABLET | Freq: Four times a day (QID) | ORAL | Status: DC | PRN

## 2012-05-23 NOTE — Telephone Encounter (Signed)
Rf req for Hydroco/APAP 10/650 1 po qid. Last filled 04/09/12. Ok to Rf?

## 2012-05-23 NOTE — Telephone Encounter (Signed)
OK to fill this prescription with additional refills x0 Thank you!  

## 2012-05-23 NOTE — Telephone Encounter (Signed)
Done

## 2012-06-21 ENCOUNTER — Other Ambulatory Visit: Payer: Self-pay | Admitting: Internal Medicine

## 2012-06-22 NOTE — Telephone Encounter (Signed)
Ok to Rf? 

## 2012-06-26 ENCOUNTER — Other Ambulatory Visit: Payer: Self-pay | Admitting: Internal Medicine

## 2012-06-27 NOTE — Telephone Encounter (Signed)
Ok to Rf? 

## 2012-06-29 ENCOUNTER — Other Ambulatory Visit: Payer: Self-pay | Admitting: Internal Medicine

## 2012-06-30 NOTE — Telephone Encounter (Signed)
Ok to Rf? 

## 2012-08-06 ENCOUNTER — Other Ambulatory Visit: Payer: Self-pay | Admitting: Internal Medicine

## 2012-08-07 ENCOUNTER — Other Ambulatory Visit: Payer: Self-pay | Admitting: *Deleted

## 2012-08-07 MED ORDER — ACYCLOVIR 400 MG PO TABS: 400.0000 mg | ORAL_TABLET | Freq: Four times a day (QID) | ORAL | Status: DC

## 2012-08-07 MED ORDER — IBUPROFEN 400 MG PO TABS: 400.0000 mg | ORAL_TABLET | Freq: Four times a day (QID) | ORAL | Status: DC | PRN

## 2012-08-07 MED ORDER — GLYCOPYRROLATE 2 MG PO TABS: 2.0000 mg | ORAL_TABLET | Freq: Two times a day (BID) | ORAL | Status: DC

## 2012-08-07 NOTE — Telephone Encounter (Signed)
Encounter open in error 

## 2012-08-07 NOTE — Telephone Encounter (Signed)
Ok to Rf? 

## 2012-08-21 ENCOUNTER — Ambulatory Visit: Payer: Managed Care, Other (non HMO) | Admitting: Internal Medicine

## 2012-08-21 DIAGNOSIS — Z0289 Encounter for other administrative examinations: Secondary | ICD-10-CM

## 2012-08-30 ENCOUNTER — Encounter: Payer: Self-pay | Admitting: Internal Medicine

## 2012-08-30 ENCOUNTER — Ambulatory Visit (INDEPENDENT_AMBULATORY_CARE_PROVIDER_SITE_OTHER): Payer: Managed Care, Other (non HMO) | Admitting: Internal Medicine

## 2012-08-30 ENCOUNTER — Other Ambulatory Visit (INDEPENDENT_AMBULATORY_CARE_PROVIDER_SITE_OTHER): Payer: Managed Care, Other (non HMO)

## 2012-08-30 VITALS — BP 122/86 | HR 90 | Temp 98.7°F | Resp 16 | Ht 68.0 in | Wt 224.5 lb

## 2012-08-30 DIAGNOSIS — I10 Essential (primary) hypertension: Secondary | ICD-10-CM

## 2012-08-30 DIAGNOSIS — J069 Acute upper respiratory infection, unspecified: Secondary | ICD-10-CM

## 2012-08-30 DIAGNOSIS — R109 Unspecified abdominal pain: Secondary | ICD-10-CM

## 2012-08-30 DIAGNOSIS — F411 Generalized anxiety disorder: Secondary | ICD-10-CM

## 2012-08-30 DIAGNOSIS — R0789 Other chest pain: Secondary | ICD-10-CM

## 2012-08-30 DIAGNOSIS — F329 Major depressive disorder, single episode, unspecified: Secondary | ICD-10-CM

## 2012-08-30 DIAGNOSIS — M545 Low back pain, unspecified: Secondary | ICD-10-CM

## 2012-08-30 DIAGNOSIS — E291 Testicular hypofunction: Secondary | ICD-10-CM

## 2012-08-30 DIAGNOSIS — F410 Panic disorder [episodic paroxysmal anxiety] without agoraphobia: Secondary | ICD-10-CM

## 2012-08-30 LAB — PSA: PSA: 0.47 ng/mL (ref 0.10–4.00)

## 2012-08-30 LAB — HEPATIC FUNCTION PANEL
ALT: 27 U/L (ref 0–53)
AST: 20 U/L (ref 0–37)
Alkaline Phosphatase: 60 U/L (ref 39–117)
Bilirubin, Direct: 0.2 mg/dL (ref 0.0–0.3)
Total Bilirubin: 1.2 mg/dL (ref 0.3–1.2)
Total Protein: 7.5 g/dL (ref 6.0–8.3)

## 2012-08-30 LAB — CBC WITH DIFFERENTIAL/PLATELET
Basophils Relative: 0.7 % (ref 0.0–3.0)
Eosinophils Relative: 2.8 % (ref 0.0–5.0)
HCT: 42.1 % (ref 39.0–52.0)
Lymphs Abs: 1.6 10*3/uL (ref 0.7–4.0)
MCV: 76.9 fl — ABNORMAL LOW (ref 78.0–100.0)
Monocytes Relative: 7.2 % (ref 3.0–12.0)
Platelets: 272 10*3/uL (ref 150.0–400.0)
RBC: 5.47 Mil/uL (ref 4.22–5.81)
WBC: 7.5 10*3/uL (ref 4.5–10.5)

## 2012-08-30 LAB — LIPID PANEL
Total CHOL/HDL Ratio: 4
Triglycerides: 188 mg/dL — ABNORMAL HIGH (ref 0.0–149.0)

## 2012-08-30 LAB — TSH: TSH: 1.3 u[IU]/mL (ref 0.35–5.50)

## 2012-08-30 LAB — TESTOSTERONE: Testosterone: 310.77 ng/dL — ABNORMAL LOW (ref 350.00–890.00)

## 2012-08-30 LAB — LDL CHOLESTEROL, DIRECT: Direct LDL: 127.1 mg/dL

## 2012-08-30 MED ORDER — AMPHETAMINE-DEXTROAMPHETAMINE 10 MG PO TABS: 10.0000 mg | ORAL_TABLET | Freq: Two times a day (BID) | ORAL | Status: DC

## 2012-08-30 NOTE — Progress Notes (Signed)
Subjective:    Patient ID: Mark Mcgee, male    DOB: 04/03/1966, 46 y.o.   MRN: [id number]  HPI  C/o CP x2 - went to ER x 2, had to see a cardiologist in [place] The patient is here to follow up on chronic depression, anxiety, IBS and chronic moderate fibromyalgia symptoms, CFS controlled with medicines, diet and exercise. Changed his job, now working for [employer]; he is in a new house (renting) - feeling a lot better C/o cough - brown sputum, hoarse, chest is tight x 4-5 d - worse   Review of Systems  Constitutional: Positive for fatigue (better). Negative for appetite change and unexpected weight change.  HENT: Negative for nosebleeds, congestion, sore throat, sneezing, trouble swallowing and neck pain.   Eyes: Negative for itching and visual disturbance.  Respiratory: Negative for cough.   Cardiovascular: Negative for chest pain, palpitations and leg swelling.  Gastrointestinal: Positive for abdominal pain, diarrhea and abdominal distention. Negative for nausea and blood in stool.  Genitourinary: Negative for frequency and hematuria.  Musculoskeletal: Negative for back pain, joint swelling and gait problem.  Skin: Negative for rash.  Neurological: Negative for dizziness, tremors, speech difficulty and weakness.  Psychiatric/Behavioral: Negative for sleep disturbance, dysphoric mood and agitation. The patient is not nervous/anxious.    Wt Readings from Last 3 Encounters:  08/30/12 224 lb 8 oz (101.833 kg)  05/22/12 219 lb (99.338 kg)  02/14/12 214 lb (97.07 kg)   BP Readings from Last 3 Encounters:  08/30/12 122/86  05/22/12 122/90  02/14/12 158/98       Objective:   Physical Exam  Constitutional: He is oriented to person, place, and time. He appears well-developed.       Obese   HENT:  Mouth/Throat: Oropharynx is clear and moist.  Eyes: Conjunctivae normal are normal. Pupils are equal, round, and reactive to light.  Neck: Normal range of motion. No JVD present. No  thyromegaly present.  Cardiovascular: Normal rate, regular rhythm, normal heart sounds and intact distal pulses.  Exam reveals no gallop and no friction rub.   No murmur heard. Pulmonary/Chest: Effort normal and breath sounds normal. No respiratory distress. He has no wheezes. He has no rales. He exhibits no tenderness.  Abdominal: Soft. Bowel sounds are normal. He exhibits no distension and no mass. There is no tenderness. There is no rebound and no guarding.  Musculoskeletal: Normal range of motion. He exhibits no edema and no tenderness.  Lymphadenopathy:    He has no cervical adenopathy.  Neurological: He is alert and oriented to person, place, and time. He has normal reflexes. No cranial nerve deficit. He exhibits normal muscle tone. Coordination normal.  Skin: Skin is warm and dry. No rash noted.  Psychiatric: He has a normal mood and affect. His behavior is normal. Judgment and thought content normal.   Lab Results  Component Value Date   WBC 10.9* 07/21/2011   HGB 14.0 07/21/2011   HCT 43.3 07/21/2011   PLT 342.0 07/21/2011   GLUCOSE 90 11/15/2011   CHOL 174 07/21/2011   TRIG 100.0 07/21/2011   HDL 48.10 07/21/2011   LDLCALC 106* 07/21/2011   ALT 60* 11/15/2011   AST 31 11/15/2011   NA 140 11/15/2011   K 4.0 11/15/2011   CL 104 11/15/2011   CREATININE 1.0 11/15/2011   BUN 18 11/15/2011   CO2 27 11/15/2011   TSH 1.76 07/21/2011   PSA 0.81 04/11/2007          Assessment &  Plan:

## 2012-08-31 ENCOUNTER — Encounter: Payer: Self-pay | Admitting: Internal Medicine

## 2012-08-31 DIAGNOSIS — R0789 Other chest pain: Secondary | ICD-10-CM | POA: Insufficient documentation

## 2012-08-31 LAB — ALLERGEN FOOD PROFILE SPECIFIC IGE
Apple: <0.1 kU/L
Corn: <0.1 kU/L
Egg White IgE: <0.1 kU/L
Fish Cod: <0.1 kU/L
IgE (Immunoglobulin E), Serum: 12.7 IU/mL (ref 0.0–180.0)
Peanut IgE: <0.1 kU/L
Shrimp IgE: <0.1 kU/L
Wheat IgE: <0.1 kU/L

## 2012-08-31 NOTE — Assessment & Plan Note (Signed)
Continue with current prescription therapy as reflected on the Med list.  

## 2012-08-31 NOTE — Assessment & Plan Note (Signed)
07/2012 s/p neg card eval at [facility] (neg stress ECHO) Food allergy panel

## 2012-08-31 NOTE — Assessment & Plan Note (Signed)
BP Readings from Last 3 Encounters:  08/30/12 122/86  05/22/12 122/90  02/14/12 158/98  Continue with current prescription therapy as reflected on the Med list.

## 2012-09-01 ENCOUNTER — Telehealth: Payer: Self-pay | Admitting: Internal Medicine

## 2012-09-01 NOTE — Telephone Encounter (Signed)
Left detailed mess informing pt of below.  

## 2012-09-01 NOTE — Telephone Encounter (Signed)
Mark Mcgee, please, inform patient that all food allergy tests ere negative Thx

## 2012-09-13 ENCOUNTER — Other Ambulatory Visit: Payer: Self-pay | Admitting: Internal Medicine

## 2012-09-14 NOTE — Telephone Encounter (Signed)
Ok to Rf? 

## 2012-10-30 ENCOUNTER — Other Ambulatory Visit: Payer: Self-pay | Admitting: Internal Medicine

## 2012-12-01 ENCOUNTER — Ambulatory Visit: Payer: Managed Care, Other (non HMO) | Admitting: Internal Medicine

## 2012-12-10 ENCOUNTER — Other Ambulatory Visit: Payer: Self-pay | Admitting: Internal Medicine

## 2013-01-15 ENCOUNTER — Other Ambulatory Visit: Payer: Self-pay | Admitting: Internal Medicine

## 2013-02-20 ENCOUNTER — Other Ambulatory Visit: Payer: Self-pay | Admitting: Internal Medicine

## 2013-02-26 NOTE — Telephone Encounter (Signed)
Notified pharmacy spoke with Pennie Rushing gave md approval...lmb

## 2013-04-03 ENCOUNTER — Encounter: Payer: Self-pay | Admitting: Internal Medicine

## 2013-04-03 ENCOUNTER — Ambulatory Visit (INDEPENDENT_AMBULATORY_CARE_PROVIDER_SITE_OTHER): Payer: Managed Care, Other (non HMO) | Admitting: Internal Medicine

## 2013-04-03 VITALS — BP 138/90 | HR 72 | Temp 98.2°F | Resp 16 | Wt 214.0 lb

## 2013-04-03 DIAGNOSIS — R197 Diarrhea, unspecified: Secondary | ICD-10-CM

## 2013-04-03 DIAGNOSIS — M545 Low back pain, unspecified: Secondary | ICD-10-CM

## 2013-04-03 DIAGNOSIS — F329 Major depressive disorder, single episode, unspecified: Secondary | ICD-10-CM

## 2013-04-03 DIAGNOSIS — E291 Testicular hypofunction: Secondary | ICD-10-CM

## 2013-04-03 DIAGNOSIS — K589 Irritable bowel syndrome without diarrhea: Secondary | ICD-10-CM

## 2013-04-03 DIAGNOSIS — F411 Generalized anxiety disorder: Secondary | ICD-10-CM

## 2013-04-03 DIAGNOSIS — I10 Essential (primary) hypertension: Secondary | ICD-10-CM

## 2013-04-03 MED ORDER — AMPHETAMINE-DEXTROAMPHETAMINE 10 MG PO TABS: 10.0000 mg | ORAL_TABLET | Freq: Two times a day (BID) | ORAL | Status: DC

## 2013-04-03 MED ORDER — BUPROPION HCL ER (SR) 150 MG PO TB12: 150.0000 mg | ORAL_TABLET | Freq: Three times a day (TID) | ORAL | Status: DC

## 2013-04-03 MED ORDER — GLYCOPYRROLATE 2 MG PO TABS: 2.0000 mg | ORAL_TABLET | Freq: Two times a day (BID) | ORAL | Status: DC

## 2013-04-03 MED ORDER — DIPHENOXYLATE-ATROPINE 2.5-0.025 MG PO TABS: 1.0000 | ORAL_TABLET | Freq: Four times a day (QID) | ORAL | Status: DC | PRN

## 2013-04-03 MED ORDER — HYDROXYZINE HCL 50 MG PO TABS: 50.0000 mg | ORAL_TABLET | Freq: Two times a day (BID) | ORAL | Status: DC | PRN

## 2013-04-03 MED ORDER — IBUPROFEN 400 MG PO TABS: 400.0000 mg | ORAL_TABLET | Freq: Four times a day (QID) | ORAL | Status: DC | PRN

## 2013-04-03 MED ORDER — COLESEVELAM HCL 3.75 G PO PACK: PACK | ORAL | Status: DC

## 2013-04-03 MED ORDER — HYOSCYAMINE SULFATE 0.125 MG PO TABS: 0.1250 mg | ORAL_TABLET | ORAL | Status: DC | PRN

## 2013-04-03 MED ORDER — HYDROCODONE-ACETAMINOPHEN 10-325 MG PO TABS: 1.0000 | ORAL_TABLET | Freq: Four times a day (QID) | ORAL | Status: DC | PRN

## 2013-04-03 MED ORDER — ALPRAZOLAM 2 MG PO TABS: 2.0000 mg | ORAL_TABLET | Freq: Three times a day (TID) | ORAL | Status: DC | PRN

## 2013-04-03 NOTE — Assessment & Plan Note (Signed)
Continue with current prescription therapy as reflected on the Med list.  

## 2013-04-03 NOTE — Assessment & Plan Note (Signed)
Periodic, IBS related

## 2013-04-03 NOTE — Assessment & Plan Note (Signed)
Lomotil prn 

## 2013-04-03 NOTE — Progress Notes (Signed)
   Subjective:    HPI  C/o diarrhea at times The patient is here to follow up on chronic depression, anxiety, IBS and chronic moderate fibromyalgia symptoms, FMS controlled with medicines, diet and exercise. Changed his job x 1 year, now working for [employer]; he is in a new house (renting) - feeling a lot better    Review of Systems  Constitutional: Positive for fatigue (better). Negative for appetite change and unexpected weight change.  HENT: Negative for nosebleeds, congestion, sore throat, sneezing, trouble swallowing and neck pain.   Eyes: Negative for itching and visual disturbance.  Respiratory: Negative for cough.   Cardiovascular: Negative for chest pain, palpitations and leg swelling.  Gastrointestinal: Positive for abdominal pain, diarrhea and abdominal distention. Negative for nausea and blood in stool.  Genitourinary: Negative for frequency and hematuria.  Musculoskeletal: Negative for back pain, joint swelling and gait problem.  Skin: Negative for rash.  Neurological: Negative for dizziness, tremors, speech difficulty and weakness.  Psychiatric/Behavioral: Negative for sleep disturbance, dysphoric mood and agitation. The patient is not nervous/anxious.    Wt Readings from Last 3 Encounters:  04/03/13 214 lb (97.07 kg)  08/30/12 224 lb 8 oz (101.833 kg)  05/22/12 219 lb (99.338 kg)   BP Readings from Last 3 Encounters:  04/03/13 138/90  08/30/12 122/86  05/22/12 122/90       Objective:   Physical Exam  Constitutional: He is oriented to person, place, and time. He appears well-developed.  Obese   HENT:  Mouth/Throat: Oropharynx is clear and moist.  Eyes: Conjunctivae are normal. Pupils are equal, round, and reactive to light.  Neck: Normal range of motion. No JVD present. No thyromegaly present.  Cardiovascular: Normal rate, regular rhythm, normal heart sounds and intact distal pulses.  Exam reveals no gallop and no friction rub.   No murmur  heard. Pulmonary/Chest: Effort normal and breath sounds normal. No respiratory distress. He has no wheezes. He has no rales. He exhibits no tenderness.  Abdominal: Soft. Bowel sounds are normal. He exhibits no distension and no mass. There is no tenderness. There is no rebound and no guarding.  Musculoskeletal: Normal range of motion. He exhibits no edema and no tenderness.  Lymphadenopathy:    He has no cervical adenopathy.  Neurological: He is alert and oriented to person, place, and time. He has normal reflexes. No cranial nerve deficit. He exhibits normal muscle tone. Coordination normal.  Skin: Skin is warm and dry. No rash noted.  Psychiatric: He has a normal mood and affect. His behavior is normal. Judgment and thought content normal.   Lab Results  Component Value Date   WBC 7.5 08/30/2012   HGB 13.5 08/30/2012   HCT 42.1 08/30/2012   PLT 272.0 08/30/2012   GLUCOSE 90 11/15/2011   CHOL 203* 08/30/2012   TRIG 188.0* 08/30/2012   HDL 46.20 08/30/2012   LDLDIRECT 127.1 08/30/2012   LDLCALC 106* 07/21/2011   ALT 27 08/30/2012   AST 20 08/30/2012   NA 140 11/15/2011   K 4.0 11/15/2011   CL 104 11/15/2011   CREATININE 1.0 11/15/2011   BUN 18 11/15/2011   CO2 27 11/15/2011   TSH 1.30 08/30/2012   PSA 0.47 08/30/2012          Assessment & Plan:

## 2013-04-05 NOTE — Assessment & Plan Note (Signed)
Continue with current prescription therapy as reflected on the Med list.  

## 2013-04-05 NOTE — Assessment & Plan Note (Signed)
IBS-D On diet See Meds

## 2013-04-10 ENCOUNTER — Telehealth: Payer: Self-pay | Admitting: *Deleted

## 2013-04-10 NOTE — Telephone Encounter (Signed)
I had a note on my desk from PCP to update pt's FMLA papers to add his next OV date of 07/06/13 to page 1, part 1. I do not see this area any where on his papers. I called pt and left mess for patient to call back for clarification on what is needed.

## 2013-04-10 NOTE — Telephone Encounter (Signed)
I received clarification from pt. I updated FMLA forms, refaxed and sent to be scanned. Pt informed

## 2013-04-19 ENCOUNTER — Telehealth: Payer: Self-pay | Admitting: *Deleted

## 2013-04-19 NOTE — Telephone Encounter (Signed)
Pt left vm requesting me to re- fax his FMLA papers with the date of last 2 OV and his upcoming OV with AVP's initials and date. Pt informed papers re faxed.

## 2013-05-02 ENCOUNTER — Other Ambulatory Visit: Payer: Self-pay | Admitting: *Deleted

## 2013-05-02 MED ORDER — TADALAFIL 5 MG PO TABS: 5.0000 mg | ORAL_TABLET | Freq: Every day | ORAL | Status: DC

## 2013-06-02 ENCOUNTER — Other Ambulatory Visit: Payer: Self-pay | Admitting: Internal Medicine

## 2013-06-21 ENCOUNTER — Other Ambulatory Visit: Payer: Self-pay | Admitting: Internal Medicine

## 2013-06-21 NOTE — Telephone Encounter (Signed)
Ok to refill? Last OV 7.8.14 Last filled 7.8.14

## 2013-07-06 ENCOUNTER — Ambulatory Visit (INDEPENDENT_AMBULATORY_CARE_PROVIDER_SITE_OTHER): Payer: Managed Care, Other (non HMO) | Admitting: Internal Medicine

## 2013-07-06 ENCOUNTER — Encounter: Payer: Self-pay | Admitting: Internal Medicine

## 2013-07-06 VITALS — BP 124/78 | HR 84 | Temp 97.3°F | Resp 16 | Wt 214.0 lb

## 2013-07-06 DIAGNOSIS — F410 Panic disorder [episodic paroxysmal anxiety] without agoraphobia: Secondary | ICD-10-CM

## 2013-07-06 DIAGNOSIS — R197 Diarrhea, unspecified: Secondary | ICD-10-CM

## 2013-07-06 DIAGNOSIS — I10 Essential (primary) hypertension: Secondary | ICD-10-CM

## 2013-07-06 DIAGNOSIS — F411 Generalized anxiety disorder: Secondary | ICD-10-CM

## 2013-07-06 MED ORDER — AMLODIPINE-OLMESARTAN 10-40 MG PO TABS: 1.0000 | ORAL_TABLET | Freq: Every day | ORAL | Status: DC

## 2013-07-06 MED ORDER — AMPHETAMINE-DEXTROAMPHETAMINE 10 MG PO TABS: 10.0000 mg | ORAL_TABLET | Freq: Two times a day (BID) | ORAL | Status: DC

## 2013-07-06 MED ORDER — HYDROCODONE-ACETAMINOPHEN 10-325 MG PO TABS: ORAL_TABLET | ORAL | Status: DC

## 2013-07-06 NOTE — Assessment & Plan Note (Signed)
Start Azor

## 2013-07-06 NOTE — Assessment & Plan Note (Signed)
Continue with current prescription therapy as reflected on the Med list. prn 

## 2013-07-06 NOTE — Assessment & Plan Note (Signed)
Try Florastor

## 2013-07-06 NOTE — Assessment & Plan Note (Signed)
Continue with current prescription therapy as reflected on the Med list.  

## 2013-07-06 NOTE — Progress Notes (Signed)
Patient ID: Mark Mcgee, male   DOB: 1966/01/28, 47 y.o.   MRN: [id number]   Subjective:    HPI  C/o diarrhea at times The patient is here to follow up on chronic depression, anxiety, IBS and chronic moderate fibromyalgia symptoms, FMS controlled with medicines, diet and exercise. Changed his job x 1 year, now working for [employer]; he is in a new house (renting) - feeling a lot better    Review of Systems  Constitutional: Positive for fatigue (better). Negative for appetite change and unexpected weight change.  HENT: Negative for congestion, nosebleeds, sneezing, sore throat and trouble swallowing.   Eyes: Negative for itching and visual disturbance.  Respiratory: Negative for cough.   Cardiovascular: Negative for chest pain, palpitations and leg swelling.  Gastrointestinal: Positive for abdominal pain, diarrhea and abdominal distention. Negative for nausea and blood in stool.  Genitourinary: Negative for frequency and hematuria.  Musculoskeletal: Negative for back pain, gait problem, joint swelling and neck pain.  Skin: Negative for rash.  Neurological: Negative for dizziness, tremors, speech difficulty and weakness.  Psychiatric/Behavioral: Negative for sleep disturbance, dysphoric mood and agitation. The patient is not nervous/anxious.    Wt Readings from Last 3 Encounters:  07/06/13 214 lb (97.07 kg)  04/03/13 214 lb (97.07 kg)  08/30/12 224 lb 8 oz (101.833 kg)   BP Readings from Last 3 Encounters:  07/06/13 124/78  04/03/13 138/90  08/30/12 122/86       Objective:   Physical Exam  Constitutional: He is oriented to person, place, and time. He appears well-developed.  Obese   HENT:  Mouth/Throat: Oropharynx is clear and moist.  Eyes: Conjunctivae are normal. Pupils are equal, round, and reactive to light.  Neck: Normal range of motion. No JVD present. No thyromegaly present.  Cardiovascular: Normal rate, regular rhythm, normal heart sounds and intact distal pulses.   Exam reveals no gallop and no friction rub.   No murmur heard. Pulmonary/Chest: Effort normal and breath sounds normal. No respiratory distress. He has no wheezes. He has no rales. He exhibits no tenderness.  Abdominal: Soft. Bowel sounds are normal. He exhibits no distension and no mass. There is no tenderness. There is no rebound and no guarding.  Musculoskeletal: Normal range of motion. He exhibits no edema and no tenderness.  Lymphadenopathy:    He has no cervical adenopathy.  Neurological: He is alert and oriented to person, place, and time. He has normal reflexes. No cranial nerve deficit. He exhibits normal muscle tone. Coordination normal.  Skin: Skin is warm and dry. No rash noted.  Psychiatric: He has a normal mood and affect. His behavior is normal. Judgment and thought content normal.   Lab Results  Component Value Date   WBC 7.5 08/30/2012   HGB 13.5 08/30/2012   HCT 42.1 08/30/2012   PLT 272.0 08/30/2012   GLUCOSE 90 11/15/2011   CHOL 203* 08/30/2012   TRIG 188.0* 08/30/2012   HDL 46.20 08/30/2012   LDLDIRECT 127.1 08/30/2012   LDLCALC 106* 07/21/2011   ALT 27 08/30/2012   AST 20 08/30/2012   NA 140 11/15/2011   K 4.0 11/15/2011   CL 104 11/15/2011   CREATININE 1.0 11/15/2011   BUN 18 11/15/2011   CO2 27 11/15/2011   TSH 1.30 08/30/2012   PSA 0.47 08/30/2012          Assessment & Plan:

## 2013-07-24 ENCOUNTER — Other Ambulatory Visit: Payer: Self-pay | Admitting: Internal Medicine

## 2013-08-03 ENCOUNTER — Other Ambulatory Visit: Payer: Self-pay | Admitting: Internal Medicine

## 2013-08-04 ENCOUNTER — Other Ambulatory Visit: Payer: Self-pay | Admitting: Internal Medicine

## 2013-08-16 ENCOUNTER — Telehealth: Payer: Self-pay

## 2013-08-16 NOTE — Telephone Encounter (Signed)
Xanax prescription was called in to [facility] pharmacy at # [phone]

## 2013-09-06 ENCOUNTER — Telehealth: Payer: Self-pay

## 2013-09-06 NOTE — Telephone Encounter (Signed)
Patient called [employer] requesting that he needs a work note that states "Please extend current leave x 6 months with no change in restrictions". He is requesting this to be faxed to [phone] by 09/20/13. Thanks

## 2013-09-06 NOTE — Telephone Encounter (Signed)
Is it a "leave" or something else? Thx

## 2013-09-07 NOTE — Telephone Encounter (Signed)
Left mess for patient to call back.  

## 2013-09-10 ENCOUNTER — Telehealth: Payer: Self-pay | Admitting: *Deleted

## 2013-09-10 NOTE — Telephone Encounter (Signed)
Patient called [employer] requesting that he needs letter that states "Please extend intermittnent leave x 6 months with no change in restrictions, until June 2015". He is requesting this to be faxed to [phone] by 09/19/13.

## 2013-09-10 NOTE — Telephone Encounter (Signed)
OK Pls print letter Thx

## 2013-09-12 ENCOUNTER — Telehealth: Payer: Self-pay | Admitting: *Deleted

## 2013-09-12 NOTE — Telephone Encounter (Signed)
Letter printed.

## 2013-09-12 NOTE — Telephone Encounter (Signed)
Letter faxed to number below. Left detailed mess informing pt.   Patient called [employer] requesting that he needs letter that states "Please extend intermittnent leave x 6 months with no change in restrictions, until June 2015". He is requesting this to be faxed to [phone] by 09/19/13.

## 2013-10-12 ENCOUNTER — Ambulatory Visit: Payer: Managed Care, Other (non HMO) | Admitting: Internal Medicine

## 2013-10-12 DIAGNOSIS — Z0289 Encounter for other administrative examinations: Secondary | ICD-10-CM

## 2014-03-21 ENCOUNTER — Other Ambulatory Visit: Payer: Self-pay | Admitting: Internal Medicine

## 2014-05-07 ENCOUNTER — Encounter: Payer: Self-pay | Admitting: Internal Medicine

## 2014-05-07 ENCOUNTER — Ambulatory Visit (INDEPENDENT_AMBULATORY_CARE_PROVIDER_SITE_OTHER): Payer: BC Managed Care – PPO | Admitting: Internal Medicine

## 2014-05-07 VITALS — BP 164/108 | HR 82 | Temp 98.4°F | Wt 225.0 lb

## 2014-05-07 DIAGNOSIS — F411 Generalized anxiety disorder: Secondary | ICD-10-CM

## 2014-05-07 DIAGNOSIS — F3289 Other specified depressive episodes: Secondary | ICD-10-CM

## 2014-05-07 DIAGNOSIS — I10 Essential (primary) hypertension: Secondary | ICD-10-CM

## 2014-05-07 DIAGNOSIS — M545 Low back pain, unspecified: Secondary | ICD-10-CM

## 2014-05-07 DIAGNOSIS — G4700 Insomnia, unspecified: Secondary | ICD-10-CM

## 2014-05-07 DIAGNOSIS — K589 Irritable bowel syndrome without diarrhea: Secondary | ICD-10-CM

## 2014-05-07 DIAGNOSIS — F329 Major depressive disorder, single episode, unspecified: Secondary | ICD-10-CM

## 2014-05-07 DIAGNOSIS — F410 Panic disorder [episodic paroxysmal anxiety] without agoraphobia: Secondary | ICD-10-CM

## 2014-05-07 MED ORDER — AMPHETAMINE-DEXTROAMPHETAMINE 10 MG PO TABS: 10.0000 mg | ORAL_TABLET | Freq: Two times a day (BID) | ORAL | Status: DC

## 2014-05-07 MED ORDER — HYDROXYZINE HCL 50 MG PO TABS: 50.0000 mg | ORAL_TABLET | Freq: Two times a day (BID) | ORAL | Status: DC | PRN

## 2014-05-07 MED ORDER — HYOSCYAMINE SULFATE 0.125 MG PO TABS: ORAL_TABLET | ORAL | Status: DC

## 2014-05-07 MED ORDER — GLYCOPYRROLATE 2 MG PO TABS: 2.0000 mg | ORAL_TABLET | Freq: Two times a day (BID) | ORAL | Status: DC

## 2014-05-07 MED ORDER — TADALAFIL 5 MG PO TABS: 5.0000 mg | ORAL_TABLET | Freq: Every day | ORAL | Status: DC

## 2014-05-07 MED ORDER — ALPRAZOLAM 2 MG PO TABS: ORAL_TABLET | ORAL | Status: DC

## 2014-05-07 MED ORDER — AMLODIPINE-OLMESARTAN 10-40 MG PO TABS: 1.0000 | ORAL_TABLET | Freq: Every day | ORAL | Status: DC

## 2014-05-07 MED ORDER — HYDROCODONE-ACETAMINOPHEN 10-325 MG PO TABS: ORAL_TABLET | ORAL | Status: DC

## 2014-05-07 MED ORDER — BUPROPION HCL ER (SR) 150 MG PO TB12: 150.0000 mg | ORAL_TABLET | Freq: Two times a day (BID) | ORAL | Status: DC

## 2014-05-07 NOTE — Assessment & Plan Note (Signed)
Continue with current prn prescription therapy as reflected on the Med list.  

## 2014-05-07 NOTE — Assessment & Plan Note (Signed)
Continue with current prescription therapy as reflected on the Med list.  

## 2014-05-07 NOTE — Assessment & Plan Note (Signed)
IBS-D Milk free, gluten free

## 2014-05-07 NOTE — Progress Notes (Signed)
   Subjective:    HPI  F/u diarrhea at times  The patient is here to follow up on chronic depression, anxiety, IBS and chronic moderate fibromyalgia symptoms, FMS controlled with medicines, diet and exercise. 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    Review of Systems  Constitutional: Positive for fatigue (better). Negative for appetite change and unexpected weight change.  HENT: Negative for congestion, nosebleeds, sneezing, sore throat and trouble swallowing.   Eyes: Negative for itching and visual disturbance.  Respiratory: Negative for cough.   Cardiovascular: Negative for chest pain, palpitations and leg swelling.  Gastrointestinal: Positive for abdominal pain, diarrhea and abdominal distention. Negative for nausea and blood in stool.  Genitourinary: Negative for frequency and hematuria.  Musculoskeletal: Negative for back pain, gait problem, joint swelling and neck pain.  Skin: Negative for rash.  Neurological: Negative for dizziness, tremors, speech difficulty and weakness.  Psychiatric/Behavioral: Negative for sleep disturbance, dysphoric mood and agitation. The patient is not nervous/anxious.    Wt Readings from Last 3 Encounters:  05/07/14 225 lb (102.059 kg)  07/06/13 214 lb (97.07 kg)  04/03/13 214 lb (97.07 kg)   BP Readings from Last 3 Encounters:  05/07/14 164/108  07/06/13 124/78  04/03/13 138/90       Objective:   Physical Exam  Constitutional: He is oriented to person, place, and time. He appears well-developed.  Obese   HENT:  Mouth/Throat: Oropharynx is clear and moist.  Eyes: Conjunctivae are normal. Pupils are equal, round, and reactive to light.  Neck: Normal range of motion. No JVD present. No thyromegaly present.  Cardiovascular: Normal rate, regular rhythm, normal heart sounds and intact distal pulses.  Exam reveals no gallop and no friction rub.   No murmur  heard. Pulmonary/Chest: Effort normal and breath sounds normal. No respiratory distress. He has no wheezes. He has no rales. He exhibits no tenderness.  Abdominal: Soft. Bowel sounds are normal. He exhibits no distension and no mass. There is no tenderness. There is no rebound and no guarding.  Musculoskeletal: Normal range of motion. He exhibits no edema and no tenderness.  Lymphadenopathy:    He has no cervical adenopathy.  Neurological: He is alert and oriented to person, place, and time. He has normal reflexes. No cranial nerve deficit. He exhibits normal muscle tone. Coordination normal.  Skin: Skin is warm and dry. No rash noted.  Psychiatric: He has a normal mood and affect. His behavior is normal. Judgment and thought content normal.  L lower chest wall is tender    Lab Results  Component Value Date   WBC 7.5 08/30/2012   HGB 13.5 08/30/2012   HCT 42.1 08/30/2012   PLT 272.0 08/30/2012   GLUCOSE 90 11/15/2011   CHOL 203* 08/30/2012   TRIG 188.0* 08/30/2012   HDL 46.20 08/30/2012   LDLDIRECT 127.1 08/30/2012   LDLCALC 106* 07/21/2011   ALT 27 08/30/2012   AST 20 08/30/2012   NA 140 11/15/2011   K 4.0 11/15/2011   CL 104 11/15/2011   CREATININE 1.0 11/15/2011   BUN 18 11/15/2011   CO2 27 11/15/2011   TSH 1.30 08/30/2012   PSA 0.47 08/30/2012          Assessment & Plan:

## 2014-05-07 NOTE — Progress Notes (Signed)
Pre visit review using our clinic review tool, if applicable. No additional management support is needed unless otherwise documented below in the visit note. 

## 2014-05-08 ENCOUNTER — Telehealth: Payer: Self-pay | Admitting: Internal Medicine

## 2014-05-08 NOTE — Telephone Encounter (Signed)
Relevant patient education mailed to patient.  

## 2014-06-09 ENCOUNTER — Other Ambulatory Visit: Payer: Self-pay | Admitting: Internal Medicine

## 2014-06-13 ENCOUNTER — Other Ambulatory Visit: Payer: Self-pay

## 2014-06-13 MED ORDER — IBUPROFEN 400 MG PO TABS: 400.0000 mg | ORAL_TABLET | Freq: Four times a day (QID) | ORAL | Status: DC | PRN

## 2014-06-17 NOTE — Telephone Encounter (Signed)
Alprazolam has been called to pharmacy voicemail 

## 2014-07-06 ENCOUNTER — Other Ambulatory Visit: Payer: Self-pay | Admitting: Internal Medicine

## 2014-07-11 MED ORDER — DIPHENOXYLATE-ATROPINE 2.5-0.025 MG PO TABS: ORAL_TABLET | ORAL | Status: DC

## 2014-07-12 NOTE — Telephone Encounter (Signed)
Called lomotil into pharmacy had to leave on pharmacy vm...[name]/lmb

## 2014-08-07 ENCOUNTER — Ambulatory Visit (INDEPENDENT_AMBULATORY_CARE_PROVIDER_SITE_OTHER): Payer: BC Managed Care – PPO | Admitting: Internal Medicine

## 2014-08-07 ENCOUNTER — Other Ambulatory Visit (INDEPENDENT_AMBULATORY_CARE_PROVIDER_SITE_OTHER): Payer: BC Managed Care – PPO

## 2014-08-07 ENCOUNTER — Encounter: Payer: Self-pay | Admitting: Internal Medicine

## 2014-08-07 VITALS — BP 122/60 | HR 94 | Temp 98.1°F | Resp 18 | Ht 67.0 in | Wt 210.0 lb

## 2014-08-07 DIAGNOSIS — Z0000 Encounter for general adult medical examination without abnormal findings: Secondary | ICD-10-CM

## 2014-08-07 DIAGNOSIS — M5440 Lumbago with sciatica, unspecified side: Secondary | ICD-10-CM

## 2014-08-07 DIAGNOSIS — K589 Irritable bowel syndrome without diarrhea: Secondary | ICD-10-CM

## 2014-08-07 DIAGNOSIS — F4323 Adjustment disorder with mixed anxiety and depressed mood: Secondary | ICD-10-CM

## 2014-08-07 DIAGNOSIS — F909 Attention-deficit hyperactivity disorder, unspecified type: Secondary | ICD-10-CM

## 2014-08-07 DIAGNOSIS — F988 Other specified behavioral and emotional disorders with onset usually occurring in childhood and adolescence: Secondary | ICD-10-CM

## 2014-08-07 DIAGNOSIS — F411 Generalized anxiety disorder: Secondary | ICD-10-CM

## 2014-08-07 LAB — CBC WITH DIFFERENTIAL/PLATELET
BASOS PCT: 0.2 % (ref 0.0–3.0)
Basophils Absolute: 0 10*3/uL (ref 0.0–0.1)
EOS PCT: 1.1 % (ref 0.0–5.0)
Eosinophils Absolute: 0.1 10*3/uL (ref 0.0–0.7)
HEMATOCRIT: 39.6 % (ref 39.0–52.0)
HEMOGLOBIN: 12.6 g/dL — AB (ref 13.0–17.0)
LYMPHS ABS: 1.5 10*3/uL (ref 0.7–4.0)
Lymphocytes Relative: 17.3 % (ref 12.0–46.0)
MCHC: 31.7 g/dL (ref 30.0–36.0)
MCV: 76.6 fl — AB (ref 78.0–100.0)
MONO ABS: 0.6 10*3/uL (ref 0.1–1.0)
MONOS PCT: 6.4 % (ref 3.0–12.0)
Neutro Abs: 6.6 10*3/uL (ref 1.4–7.7)
Neutrophils Relative %: 75 % (ref 43.0–77.0)
Platelets: 256 10*3/uL (ref 150.0–400.0)
RBC: 5.17 Mil/uL (ref 4.22–5.81)
RDW: 14.5 % (ref 11.5–15.5)
WBC: 8.8 10*3/uL (ref 4.0–10.5)

## 2014-08-07 LAB — VITAMIN D 25 HYDROXY (VIT D DEFICIENCY, FRACTURES): VITD: 24.41 ng/mL — ABNORMAL LOW (ref 30.00–100.00)

## 2014-08-07 LAB — PSA: PSA: 0.75 ng/mL (ref 0.10–4.00)

## 2014-08-07 LAB — VITAMIN B12: VITAMIN B 12: 493 pg/mL (ref 211–911)

## 2014-08-07 LAB — TSH: TSH: 1.5 u[IU]/mL (ref 0.35–4.50)

## 2014-08-07 MED ORDER — HYDROCODONE-ACETAMINOPHEN 10-325 MG PO TABS: ORAL_TABLET | ORAL | Status: DC

## 2014-08-07 MED ORDER — AMPHETAMINE-DEXTROAMPHETAMINE 10 MG PO TABS: 10.0000 mg | ORAL_TABLET | Freq: Two times a day (BID) | ORAL | Status: DC

## 2014-08-07 NOTE — Progress Notes (Signed)
Pre visit review using our clinic review tool, if applicable. No additional management support is needed unless otherwise documented below in the visit note. 

## 2014-08-07 NOTE — Assessment & Plan Note (Signed)
Explosive diarrhea IBS-D Milk free, gluten free, Primal blueprint

## 2014-08-07 NOTE — Patient Instructions (Signed)
Best boyGoogle Primal blueprint

## 2014-08-07 NOTE — Assessment & Plan Note (Signed)
Chronic   Potential benefits of a long term stimulants use as well as potential risks  and complications were explained to the patient and were aknowledged. Continue with current prescription therapy as reflected on the Med list.  

## 2014-08-07 NOTE — Progress Notes (Signed)
Subjective:    HPI  The patient is here for a wellness exam. The patient has been doing well overall without major physical or psychological issues going on lately.  F/u diarrhea at times - worse  The patient is here to follow up on chronic depression, anxiety, IBS and chronic moderate fibromyalgia symptoms, FMS controlled with medicines, diet and exercise. 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    Review of Systems  Constitutional: Positive for fatigue (better). Negative for appetite change and unexpected weight change.  HENT: Negative for congestion, nosebleeds, sneezing, sore throat and trouble swallowing.   Eyes: Negative for itching and visual disturbance.  Respiratory: Negative for cough.   Cardiovascular: Negative for chest pain, palpitations and leg swelling.  Gastrointestinal: Positive for abdominal pain, diarrhea and abdominal distention. Negative for nausea and blood in stool.  Genitourinary: Negative for frequency and hematuria.  Musculoskeletal: Negative for back pain, joint swelling, gait problem and neck pain.  Skin: Negative for rash.  Neurological: Negative for dizziness, tremors, speech difficulty and weakness.  Psychiatric/Behavioral: Negative for sleep disturbance, dysphoric mood and agitation. The patient is not nervous/anxious.    Wt Readings from Last 3 Encounters:  08/07/14 210 lb (95.255 kg)  05/07/14 225 lb (102.059 kg)  07/06/13 214 lb (97.07 kg)   BP Readings from Last 3 Encounters:  08/07/14 122/60  05/07/14 164/108  07/06/13 124/78       Objective:   Physical Exam  Constitutional: He is oriented to person, place, and time. He appears well-developed. No distress.  NAD  HENT:  Mouth/Throat: Oropharynx is clear and moist.  Eyes: Conjunctivae are normal. Pupils are equal, round, and reactive to light.  Neck: Normal range of motion. No JVD present. No thyromegaly present.   Cardiovascular: Normal rate, regular rhythm, normal heart sounds and intact distal pulses.  Exam reveals no gallop and no friction rub.   No murmur heard. Pulmonary/Chest: Effort normal and breath sounds normal. No respiratory distress. He has no wheezes. He has no rales. He exhibits no tenderness.  Abdominal: Soft. Bowel sounds are normal. He exhibits no distension and no mass. There is no tenderness. There is no rebound and no guarding.  Musculoskeletal: Normal range of motion. He exhibits no edema or tenderness.  Lymphadenopathy:    He has no cervical adenopathy.  Neurological: He is alert and oriented to person, place, and time. He has normal reflexes. No cranial nerve deficit. He exhibits normal muscle tone. He displays a negative Romberg sign. Coordination and gait normal.  No meningeal signs  Skin: Skin is warm and dry. No rash noted.  Psychiatric: He has a normal mood and affect. His behavior is normal. Judgment and thought content normal.  L lower chest wall is not tender    Lab Results  Component Value Date   WBC 7.5 08/30/2012   HGB 13.5 08/30/2012   HCT 42.1 08/30/2012   PLT 272.0 08/30/2012   GLUCOSE 90 11/15/2011   CHOL 203* 08/30/2012   TRIG 188.0* 08/30/2012   HDL 46.20 08/30/2012   LDLDIRECT 127.1 08/30/2012   LDLCALC 106* 07/21/2011   ALT 27 08/30/2012   AST 20 08/30/2012   NA 140 11/15/2011   K 4.0 11/15/2011   CL 104 11/15/2011   CREATININE 1.0 11/15/2011   BUN 18 11/15/2011   CO2 27 11/15/2011   TSH 1.30 08/30/2012   PSA 0.47 08/30/2012  Assessment & Plan:

## 2014-08-07 NOTE — Assessment & Plan Note (Signed)
Chronic, severe.  

## 2014-08-07 NOTE — Assessment & Plan Note (Signed)
We discussed age appropriate health related issues, including available/recomended screening tests and vaccinations. We discussed a need for adhering to healthy diet and exercise. Labs/EKG were reviewed/ordered. All questions were answered.   

## 2014-08-07 NOTE — Assessment & Plan Note (Signed)
Continue with current prescription therapy as reflected on the Med list.  

## 2014-08-08 ENCOUNTER — Other Ambulatory Visit: Payer: Self-pay | Admitting: Internal Medicine

## 2014-08-08 LAB — BASIC METABOLIC PANEL
BUN: 17 mg/dL (ref 6–23)
CHLORIDE: 105 meq/L (ref 96–112)
CO2: 28 mEq/L (ref 19–32)
Calcium: 10 mg/dL (ref 8.4–10.5)
Creatinine, Ser: 1.3 mg/dL (ref 0.4–1.5)
GFR: 60.64 mL/min (ref >60.00)
Glucose, Bld: 101 mg/dL — ABNORMAL HIGH (ref 70–99)
POTASSIUM: 4.9 meq/L (ref 3.5–5.1)
SODIUM: 142 meq/L (ref 135–145)

## 2014-08-08 LAB — HEPATIC FUNCTION PANEL
ALT: 72 U/L — ABNORMAL HIGH (ref 0–53)
AST: 42 U/L — AB (ref 0–37)
Albumin: 4.4 g/dL (ref 3.5–5.2)
Alkaline Phosphatase: 56 U/L (ref 39–117)
BILIRUBIN TOTAL: 1.2 mg/dL (ref 0.2–1.2)
Bilirubin, Direct: 0.2 mg/dL (ref 0.0–0.3)
TOTAL PROTEIN: 7.7 g/dL (ref 6.0–8.3)

## 2014-08-08 LAB — LIPID PANEL
CHOL/HDL RATIO: 2
CHOLESTEROL: 188 mg/dL (ref 0–200)
HDL: 84.2 mg/dL (ref >39.00)
LDL CALC: 94 mg/dL (ref 0–99)
NonHDL: 103.8
TRIGLYCERIDES: 50 mg/dL (ref 0.0–149.0)
VLDL: 10 mg/dL (ref 0.0–40.0)

## 2014-08-08 LAB — HIV ANTIBODY (ROUTINE TESTING W REFLEX): HIV 1&2 Ab, 4th Generation: NONREACTIVE

## 2014-08-08 MED ORDER — ERGOCALCIFEROL 1.25 MG (50000 UT) PO CAPS: 50000.0000 [IU] | ORAL_CAPSULE | ORAL | Status: DC

## 2014-08-12 ENCOUNTER — Other Ambulatory Visit: Payer: Self-pay | Admitting: Internal Medicine

## 2014-08-16 MED ORDER — ALPRAZOLAM 2 MG PO TABS: ORAL_TABLET | ORAL | Status: DC

## 2014-08-16 NOTE — Telephone Encounter (Signed)
Called refill in for the alprazolam spoke with Cala Bradfordkimberly gave md approval.../lmb

## 2014-09-07 ENCOUNTER — Other Ambulatory Visit: Payer: Self-pay | Admitting: Internal Medicine

## 2014-09-13 ENCOUNTER — Telehealth: Payer: Self-pay | Admitting: Internal Medicine

## 2014-09-13 NOTE — Telephone Encounter (Signed)
Ok pls write thx

## 2014-09-13 NOTE — Telephone Encounter (Signed)
Pt called in and wanted to see if Dr Macario GoldsPlot would Write him a note Stating the Following:  Mr Mark Mcgee need a private Hotel [employer] due to a Cronic  Issue.     He would like it faxed to [phone] And also mailed a copy   He also said please call if there were any questions.

## 2014-09-13 NOTE — Telephone Encounter (Signed)
Generated letter md sign notified letter has been fax & copy mail to his address...[name]/lmb

## 2014-10-08 ENCOUNTER — Other Ambulatory Visit: Payer: Self-pay | Admitting: Internal Medicine

## 2014-10-09 NOTE — Telephone Encounter (Signed)
Patient requesting amLODipine-olmesartan (AZOR) 10-40 MG per tablet  to be faxed in to mail order fax # [phone]

## 2014-10-10 ENCOUNTER — Other Ambulatory Visit: Payer: Self-pay | Admitting: *Deleted

## 2014-10-10 MED ORDER — AMLODIPINE-OLMESARTAN 10-40 MG PO TABS
1.0000 | ORAL_TABLET | Freq: Every day | ORAL | Status: DC
Start: 2014-10-10 — End: 2015-11-20

## 2014-10-10 MED ORDER — ACYCLOVIR 400 MG PO TABS: 400.0000 mg | ORAL_TABLET | Freq: Four times a day (QID) | ORAL | Status: DC

## 2014-10-10 NOTE — Telephone Encounter (Signed)
Called pt no answer LMOM need to know which mail service he is using. pls return call...[name]/lmb

## 2014-10-10 NOTE — Telephone Encounter (Signed)
Left msg on triage wanting to get a refill on his hydrocodone. He has broken his foot, and he states he [place] in [place]. Wanting to see can md mail rx...[name]/lmb

## 2014-10-10 NOTE — Telephone Encounter (Signed)
Pt left msg on triage stating he is using [facility] mail service [phone]. Called pt no answer LMOM medication has been sent...[name]/lmb

## 2014-10-11 ENCOUNTER — Other Ambulatory Visit: Payer: Self-pay | Admitting: *Deleted

## 2014-10-11 MED ORDER — DIPHENOXYLATE-ATROPINE 2.5-0.025 MG PO TABS: ORAL_TABLET | ORAL | Status: DC

## 2014-10-11 MED ORDER — HYDROCODONE-ACETAMINOPHEN 10-325 MG PO TABS: ORAL_TABLET | ORAL | Status: DC

## 2014-10-11 MED ORDER — ALPRAZOLAM 2 MG PO TABS: ORAL_TABLET | ORAL | Status: DC

## 2014-10-11 NOTE — Telephone Encounter (Signed)
Rf req for generic Lomotil. 1-2 po qid prn # 60. Last filled 08/13/14. Ok to rf?

## 2014-10-11 NOTE — Telephone Encounter (Signed)
Called pharmacy spoke with Remus BlakeKera gave md approval.../lmb

## 2014-10-11 NOTE — Telephone Encounter (Signed)
Pls advise on msg below.../lmb 

## 2014-10-11 NOTE — Telephone Encounter (Signed)
OK to fill this prescription with additional refills x3 Thank you!  

## 2014-10-14 ENCOUNTER — Telehealth: Payer: Self-pay | Admitting: *Deleted

## 2014-10-14 NOTE — Telephone Encounter (Signed)
10/11/14 Hydrocodone/APAP 10/325 Rx is upfront for p/u. Pt informed

## 2014-11-08 ENCOUNTER — Encounter: Payer: Self-pay | Admitting: Internal Medicine

## 2014-11-08 ENCOUNTER — Ambulatory Visit (INDEPENDENT_AMBULATORY_CARE_PROVIDER_SITE_OTHER): Payer: BLUE CROSS/BLUE SHIELD | Admitting: Internal Medicine

## 2014-11-08 VITALS — BP 120/70 | HR 99 | Temp 98.9°F | Wt 209.0 lb

## 2014-11-08 DIAGNOSIS — R071 Chest pain on breathing: Secondary | ICD-10-CM

## 2014-11-08 DIAGNOSIS — I10 Essential (primary) hypertension: Secondary | ICD-10-CM

## 2014-11-08 DIAGNOSIS — M5440 Lumbago with sciatica, unspecified side: Secondary | ICD-10-CM

## 2014-11-08 DIAGNOSIS — F988 Other specified behavioral and emotional disorders with onset usually occurring in childhood and adolescence: Secondary | ICD-10-CM

## 2014-11-08 DIAGNOSIS — F909 Attention-deficit hyperactivity disorder, unspecified type: Secondary | ICD-10-CM

## 2014-11-08 MED ORDER — AMPHETAMINE-DEXTROAMPHETAMINE 10 MG PO TABS: 10.0000 mg | ORAL_TABLET | Freq: Two times a day (BID) | ORAL | Status: DC

## 2014-11-08 MED ORDER — HYDROCODONE-ACETAMINOPHEN 10-325 MG PO TABS: ORAL_TABLET | ORAL | Status: DC

## 2014-11-08 NOTE — Progress Notes (Signed)
Subjective:    HPI  The patient has been doing well overall without major physical or psychological issues going on lately.  F/u diarrhea at times - worse  The patient is here to follow up on chronic depression, anxiety, IBS and chronic moderate fibromyalgia symptoms, FMS controlled with medicines, diet and exercise. 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  C/o L CP x a while  Review of Systems  Constitutional: Positive for fatigue (better). Negative for appetite change and unexpected weight change.  HENT: Negative for congestion, nosebleeds, sneezing, sore throat and trouble swallowing.   Eyes: Negative for itching and visual disturbance.  Respiratory: Negative for cough.   Cardiovascular: Negative for chest pain, palpitations and leg swelling.  Gastrointestinal: Positive for abdominal pain, diarrhea and abdominal distention. Negative for nausea and blood in stool.  Genitourinary: Negative for frequency and hematuria.  Musculoskeletal: Negative for back pain, joint swelling, gait problem and neck pain.  Skin: Negative for rash.  Neurological: Negative for dizziness, tremors, speech difficulty and weakness.  Psychiatric/Behavioral: Negative for sleep disturbance, dysphoric mood and agitation. The patient is not nervous/anxious.    Wt Readings from Last 3 Encounters:  11/08/14 209 lb (94.802 kg)  08/07/14 210 lb (95.255 kg)  05/07/14 225 lb (102.059 kg)   BP Readings from Last 3 Encounters:  11/08/14 120/70  08/07/14 122/60  05/07/14 164/108       Objective:   Physical Exam  Constitutional: He is oriented to person, place, and time. He appears well-developed. No distress.  NAD  HENT:  Mouth/Throat: Oropharynx is clear and moist.  Eyes: Conjunctivae are normal. Pupils are equal, round, and reactive to light.  Neck: Normal range of motion. No JVD present. No thyromegaly present.  Cardiovascular: Normal rate, regular rhythm, normal heart sounds and intact distal  pulses.  Exam reveals no gallop and no friction rub.   No murmur heard. Pulmonary/Chest: Effort normal and breath sounds normal. No respiratory distress. He has no wheezes. He has no rales. He exhibits no tenderness.  Abdominal: Soft. Bowel sounds are normal. He exhibits no distension and no mass. There is no tenderness. There is no rebound and no guarding.  Musculoskeletal: Normal range of motion. He exhibits no edema or tenderness.  Lymphadenopathy:    He has no cervical adenopathy.  Neurological: He is alert and oriented to person, place, and time. He has normal reflexes. No cranial nerve deficit. He exhibits normal muscle tone. He displays a negative Romberg sign. Coordination and gait normal.  No meningeal signs  Skin: Skin is warm and dry. No rash noted.  Psychiatric: He has a normal mood and affect. His behavior is normal. Judgment and thought content normal.  L lower chest wall is tender L foot is in a boot   Lab Results  Component Value Date   WBC 8.8 08/07/2014   HGB 12.6* 08/07/2014   HCT 39.6 08/07/2014   PLT 256.0 08/07/2014   GLUCOSE 101* 08/07/2014   CHOL 188 08/07/2014   TRIG 50.0 08/07/2014   HDL 84.20 08/07/2014   LDLDIRECT 127.1 08/30/2012   LDLCALC 94 08/07/2014   ALT 72* 08/07/2014   AST 42* 08/07/2014   NA 142 08/07/2014   K 4.9 08/07/2014   CL 105 08/07/2014   CREATININE 1.3 08/07/2014   BUN 17 08/07/2014   CO2 28 08/07/2014   TSH 1.50 08/07/2014   PSA 0.75 08/07/2014          Assessment & Plan:

## 2014-11-08 NOTE — Assessment & Plan Note (Signed)
Continue with current prescription therapy as reflected on the Med list.  

## 2014-11-08 NOTE — Progress Notes (Signed)
Pre visit review using our clinic review tool, if applicable. No additional management support is needed unless otherwise documented below in the visit note. 

## 2014-11-08 NOTE — Assessment & Plan Note (Signed)
L costochondritis recurrent

## 2014-11-08 NOTE — Assessment & Plan Note (Signed)
Continue with current prescription therapy as reflected on the Med list.  Potential benefits of a long term opioids use as well as potential risks (i.e. addiction risk, apnea etc) and complications (i.e. Somnolence, constipation and others) were explained to the patient and were aknowledged.  

## 2014-11-09 ENCOUNTER — Encounter: Payer: Self-pay | Admitting: Internal Medicine

## 2014-12-17 ENCOUNTER — Other Ambulatory Visit: Payer: Self-pay | Admitting: Internal Medicine

## 2014-12-30 ENCOUNTER — Other Ambulatory Visit: Payer: Self-pay | Admitting: Internal Medicine

## 2015-01-22 ENCOUNTER — Other Ambulatory Visit: Payer: Self-pay

## 2015-01-22 MED ORDER — TADALAFIL 5 MG PO TABS: 5.0000 mg | ORAL_TABLET | Freq: Every day | ORAL | Status: DC

## 2015-01-22 MED ORDER — IBUPROFEN 400 MG PO TABS: 400.0000 mg | ORAL_TABLET | Freq: Four times a day (QID) | ORAL | Status: DC | PRN

## 2015-02-05 ENCOUNTER — Other Ambulatory Visit: Payer: Self-pay | Admitting: Internal Medicine

## 2015-02-07 ENCOUNTER — Ambulatory Visit: Payer: BLUE CROSS/BLUE SHIELD | Admitting: Internal Medicine

## 2015-02-12 ENCOUNTER — Telehealth: Payer: Self-pay | Admitting: *Deleted

## 2015-02-12 NOTE — Telephone Encounter (Signed)
Pt is requesting to p/u written Rxs for Hydrocodone,  (Adderall and alprazolam- 90 day supplies for mail oder). He would like to p/u Rxs on 02/13/15. Please advise

## 2015-02-13 NOTE — Telephone Encounter (Signed)
OK to fill these prescriptions with additional refills x0 - ROV q 3 mo Thank you!

## 2015-02-14 ENCOUNTER — Ambulatory Visit: Payer: BLUE CROSS/BLUE SHIELD | Admitting: Internal Medicine

## 2015-02-14 MED ORDER — AMPHETAMINE-DEXTROAMPHETAMINE 10 MG PO TABS: 10.0000 mg | ORAL_TABLET | Freq: Two times a day (BID) | ORAL | Status: DC

## 2015-02-14 MED ORDER — ALPRAZOLAM 2 MG PO TABS: 2.0000 mg | ORAL_TABLET | Freq: Three times a day (TID) | ORAL | Status: DC

## 2015-02-14 MED ORDER — HYDROCODONE-ACETAMINOPHEN 10-325 MG PO TABS: ORAL_TABLET | ORAL | Status: DC

## 2015-02-14 NOTE — Telephone Encounter (Signed)
Called pt no answer LMOM rx ready for pick-up.../lmb 

## 2015-02-28 ENCOUNTER — Encounter: Payer: Self-pay | Admitting: Internal Medicine

## 2015-02-28 ENCOUNTER — Ambulatory Visit (INDEPENDENT_AMBULATORY_CARE_PROVIDER_SITE_OTHER): Payer: BLUE CROSS/BLUE SHIELD | Admitting: Internal Medicine

## 2015-02-28 VITALS — BP 110/76 | HR 78 | Wt 210.0 lb

## 2015-02-28 DIAGNOSIS — R197 Diarrhea, unspecified: Secondary | ICD-10-CM

## 2015-02-28 DIAGNOSIS — E291 Testicular hypofunction: Secondary | ICD-10-CM

## 2015-02-28 DIAGNOSIS — K589 Irritable bowel syndrome without diarrhea: Secondary | ICD-10-CM

## 2015-02-28 DIAGNOSIS — M5440 Lumbago with sciatica, unspecified side: Secondary | ICD-10-CM | POA: Diagnosis not present

## 2015-02-28 DIAGNOSIS — Z23 Encounter for immunization: Secondary | ICD-10-CM | POA: Diagnosis not present

## 2015-02-28 DIAGNOSIS — F410 Panic disorder [episodic paroxysmal anxiety] without agoraphobia: Secondary | ICD-10-CM | POA: Diagnosis not present

## 2015-02-28 DIAGNOSIS — I10 Essential (primary) hypertension: Secondary | ICD-10-CM | POA: Diagnosis not present

## 2015-02-28 MED ORDER — ELUXADOLINE 100 MG PO TABS: 100.0000 mg | ORAL_TABLET | Freq: Two times a day (BID) | ORAL | Status: DC

## 2015-02-28 NOTE — Assessment & Plan Note (Signed)
Potential benefits of a long term testosterone use as well as potential risks  and complications were explained to the patient and were aknowledged. On Tetosterone 

## 2015-02-28 NOTE — Assessment & Plan Note (Signed)
Chronic Azor 

## 2015-02-28 NOTE — Assessment & Plan Note (Signed)
Chronic Norco prn  Potential benefits of a long term opioids use as well as potential risks (i.e. addiction risk, apnea etc) and complications (i.e. Somnolence, constipation and others) were explained to the patient and were aknowledged. 

## 2015-02-28 NOTE — Assessment & Plan Note (Signed)
Chronic, severe Xanax prn  Potential benefits of a long term benzodiazepines  use as well as potential risks  and complications were explained to the patient and were aknowledged. 

## 2015-02-28 NOTE — Assessment & Plan Note (Addendum)
Viberzi discussed - 100 mg bid

## 2015-02-28 NOTE — Progress Notes (Signed)
Pre visit review using our clinic review tool, if applicable. No additional management support is needed unless otherwise documented below in the visit note. 

## 2015-03-02 DIAGNOSIS — K589 Irritable bowel syndrome without diarrhea: Secondary | ICD-10-CM | POA: Insufficient documentation

## 2015-03-02 NOTE — Assessment & Plan Note (Signed)
Relapsing On gluten and milk free diet  IBS-D  6.16: Viberzi discussed - 100 mg bid

## 2015-03-05 ENCOUNTER — Encounter: Payer: Self-pay | Admitting: Gastroenterology

## 2015-03-19 ENCOUNTER — Other Ambulatory Visit: Payer: Self-pay | Admitting: Internal Medicine

## 2015-03-19 NOTE — Telephone Encounter (Signed)
Please advise, thanks.

## 2015-03-20 NOTE — Telephone Encounter (Signed)
Called pharmacy had to leave refill info on pharmacist vm left md approval.../lmb

## 2015-05-06 ENCOUNTER — Other Ambulatory Visit: Payer: Self-pay | Admitting: Internal Medicine

## 2015-05-28 ENCOUNTER — Encounter: Payer: Self-pay | Admitting: Internal Medicine

## 2015-05-29 ENCOUNTER — Other Ambulatory Visit: Payer: Self-pay | Admitting: *Deleted

## 2015-05-29 MED ORDER — HYDROCODONE-ACETAMINOPHEN 10-325 MG PO TABS: ORAL_TABLET | ORAL | Status: DC

## 2015-05-29 MED ORDER — AMPHETAMINE-DEXTROAMPHETAMINE 10 MG PO TABS
10.0000 mg | ORAL_TABLET | Freq: Two times a day (BID) | ORAL | Status: DC
Start: 2015-05-29 — End: 2015-06-06

## 2015-05-30 NOTE — Telephone Encounter (Signed)
Notified pt rx ready for pick-up.../lmb 

## 2015-06-06 ENCOUNTER — Ambulatory Visit (INDEPENDENT_AMBULATORY_CARE_PROVIDER_SITE_OTHER)
Admission: RE | Admit: 2015-06-06 | Discharge: 2015-06-06 | Disposition: A | Discharge status: Home / Self Care | Payer: BLUE CROSS/BLUE SHIELD | Source: Ambulatory Visit | Attending: Internal Medicine | Admitting: Internal Medicine

## 2015-06-06 ENCOUNTER — Ambulatory Visit (INDEPENDENT_AMBULATORY_CARE_PROVIDER_SITE_OTHER): Payer: BLUE CROSS/BLUE SHIELD | Admitting: Internal Medicine

## 2015-06-06 ENCOUNTER — Encounter: Payer: Self-pay | Admitting: Internal Medicine

## 2015-06-06 ENCOUNTER — Other Ambulatory Visit (INDEPENDENT_AMBULATORY_CARE_PROVIDER_SITE_OTHER): Payer: BLUE CROSS/BLUE SHIELD

## 2015-06-06 VITALS — BP 110/62 | HR 74 | Wt 207.0 lb

## 2015-06-06 DIAGNOSIS — R7611 Nonspecific reaction to tuberculin skin test without active tuberculosis: Secondary | ICD-10-CM

## 2015-06-06 DIAGNOSIS — E038 Other specified hypothyroidism: Secondary | ICD-10-CM | POA: Diagnosis not present

## 2015-06-06 DIAGNOSIS — Z0000 Encounter for general adult medical examination without abnormal findings: Secondary | ICD-10-CM | POA: Diagnosis not present

## 2015-06-06 LAB — URINALYSIS
Bilirubin Urine: NEGATIVE
HGB URINE DIPSTICK: NEGATIVE
KETONES UR: NEGATIVE
LEUKOCYTES UA: NEGATIVE
Nitrite: NEGATIVE
SPECIFIC GRAVITY, URINE: 1.01 (ref 1.000–1.030)
Total Protein, Urine: NEGATIVE
UROBILINOGEN UA: 0.2 (ref 0.0–1.0)
Urine Glucose: NEGATIVE
pH: 6 (ref 5.0–8.0)

## 2015-06-06 LAB — LIPID PANEL
CHOLESTEROL: 166 mg/dL (ref 0–200)
HDL: 73.7 mg/dL (ref >39.00)
LDL Cholesterol: 80 mg/dL (ref 0–99)
NonHDL: 91.99
Total CHOL/HDL Ratio: 2
Triglycerides: 60 mg/dL (ref 0.0–149.0)
VLDL: 12 mg/dL (ref 0.0–40.0)

## 2015-06-06 LAB — BASIC METABOLIC PANEL
BUN: 14 mg/dL (ref 6–23)
CALCIUM: 10 mg/dL (ref 8.4–10.5)
CO2: 26 meq/L (ref 19–32)
Chloride: 104 mEq/L (ref 96–112)
Creatinine, Ser: 1.13 mg/dL (ref 0.40–1.50)
GFR: 73.56 mL/min (ref >60.00)
GLUCOSE: 102 mg/dL — AB (ref 70–99)
Potassium: 4.3 mEq/L (ref 3.5–5.1)
SODIUM: 140 meq/L (ref 135–145)

## 2015-06-06 LAB — HEPATIC FUNCTION PANEL
ALBUMIN: 4.9 g/dL (ref 3.5–5.2)
ALK PHOS: 50 U/L (ref 39–117)
ALT: 24 U/L (ref 0–53)
AST: 16 U/L (ref 0–37)
Bilirubin, Direct: 0.2 mg/dL (ref 0.0–0.3)
TOTAL PROTEIN: 7.5 g/dL (ref 6.0–8.3)
Total Bilirubin: 0.9 mg/dL (ref 0.2–1.2)

## 2015-06-06 LAB — TSH: TSH: 1.63 u[IU]/mL (ref 0.35–4.50)

## 2015-06-06 LAB — CBC WITH DIFFERENTIAL/PLATELET
BASOS ABS: 0 10*3/uL (ref 0.0–0.1)
Basophils Relative: 0.5 % (ref 0.0–3.0)
EOS ABS: 0.1 10*3/uL (ref 0.0–0.7)
Eosinophils Relative: 0.8 % (ref 0.0–5.0)
HCT: 43.3 % (ref 39.0–52.0)
Hemoglobin: 13.7 g/dL (ref 13.0–17.0)
LYMPHS ABS: 1.5 10*3/uL (ref 0.7–4.0)
Lymphocytes Relative: 21.4 % (ref 12.0–46.0)
MCHC: 31.6 g/dL (ref 30.0–36.0)
MCV: 80.6 fl (ref 78.0–100.0)
MONO ABS: 0.4 10*3/uL (ref 0.1–1.0)
Monocytes Relative: 6.3 % (ref 3.0–12.0)
NEUTROS PCT: 71 % (ref 43.0–77.0)
Neutro Abs: 5 10*3/uL (ref 1.4–7.7)
Platelets: 263 10*3/uL (ref 150.0–400.0)
RBC: 5.37 Mil/uL (ref 4.22–5.81)
RDW: 15 % (ref 11.5–15.5)
WBC: 7.1 10*3/uL (ref 4.0–10.5)

## 2015-06-06 LAB — PSA: PSA: 0.6 ng/mL (ref 0.10–4.00)

## 2015-06-06 LAB — TESTOSTERONE: TESTOSTERONE: 236.17 ng/dL — AB (ref 300.00–890.00)

## 2015-06-06 MED ORDER — HYDROCODONE-ACETAMINOPHEN 10-325 MG PO TABS: ORAL_TABLET | ORAL | Status: DC

## 2015-06-06 MED ORDER — AMPHETAMINE-DEXTROAMPHETAMINE 10 MG PO TABS: 10.0000 mg | ORAL_TABLET | Freq: Two times a day (BID) | ORAL | Status: DC

## 2015-06-06 NOTE — Progress Notes (Signed)
Pre visit review using our clinic review tool, if applicable. No additional management support is needed unless otherwise documented below in the visit note. 

## 2015-06-06 NOTE — Progress Notes (Signed)
Subjective:  Patient ID: Mark Mcgee, male    DOB: 01-27-1966  Age: 49 y.o. MRN: [id number]  CC: No chief complaint on file.   HPI Mark Mcgee presents for IBS-D, ADD, LBP f/u. Much better on Viberzi. 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 F/u recurrent jaw pain.  Outpatient Prescriptions Prior to Visit  Medication Sig Dispense Refill  . acyclovir (ZOVIRAX) 400 MG tablet Take 1 tablet (400 mg total) by mouth 4 (four) times daily. 28 tablet 5  . alprazolam (XANAX) 2 MG tablet TAKE 1 TABLET BY MOUTH THREE TIMES DAILY 90 tablet 1  . amLODipine-olmesartan (AZOR) 10-40 MG per tablet Take 1 tablet by mouth daily. 90 tablet 3  . ANDROGEL PUMP 20.25 MG/ACT (1.62%) GEL APPLY 4 PUMPS ON THE SKIN EVERY MORNING 450 g 0  . b complex vitamins tablet Take 1 tablet by mouth daily.      [name] buPROPion (WELLBUTRIN SR) 150 MG 12 hr tablet Take 1 tablet (150 mg total) by mouth 2 (two) times daily. 180 tablet 2  . Cholecalciferol 1000 UNITS tablet Take 2,000 Units by mouth daily.      . diphenoxylate-atropine (LOMOTIL) 2.5-0.025 MG per tablet TAKE 1 OR 2 TABLETS BY MOUTH 4 TIMES DAILY AS NEEDED FOR DIARRHEA OR LOOSE STOOLS MAX OF 8 TABLETS PER DAY 60 tablet 3  . Eluxadoline (VIBERZI) 100 MG TABS Take 100 mg by mouth 2 (two) times daily. 60 tablet 5  . Flaxseed, Linseed, (FLAX SEEDS) POWD Take by mouth 2 (two) times daily.      [name] glycopyrrolate (ROBINUL) 2 MG tablet Take 1 tablet (2 mg total) by mouth 2 (two) times daily. 180 tablet 3  . hydrOXYzine (ATARAX/VISTARIL) 50 MG tablet TAKE 1-2 TABLETS BY MOUTH TWICE DAILY AS NEEDED FOR ITCHING OR FOR NAUSEA 30 tablet 0  . hyoscyamine (LEVSIN, ANASPAZ) 0.125 MG tablet TAKE 1 TO 2 TABLETS BY MOUTH EVERY 4 HOURS AS NEEDED 180 tablet 1  . ibuprofen (ADVIL,MOTRIN) 400 MG tablet Take 1 tablet (400 mg total) by mouth 4 (four) times daily as needed. 60 tablet 5  . Vitamin D, Ergocalciferol, (DRISDOL) 50000 UNITS CAPS capsule TAKE 1 CAPSULE BY MOUTH ONCE  A WEEK. 8 capsule 0  . amphetamine-dextroamphetamine (ADDERALL) 10 MG tablet Take 1 tablet (10 mg total) by mouth 2 (two) times daily with breakfast and lunch. Please fill on or after 05/29/15 180 tablet 0  . HYDROcodone-acetaminophen (NORCO) 10-325 MG per tablet TAKE 1 TABLET BY MOUTH EVERY 6 HOURS AS NEEDED FOR PAIN 120 tablet 0  . tadalafil (CIALIS) 5 MG tablet Take 1 tablet (5 mg total) by mouth daily. 90 tablet 3   No facility-administered medications prior to visit.    ROS Review of Systems  Constitutional: Negative for chills, appetite change, fatigue and unexpected weight change.  HENT: Negative for congestion, nosebleeds, sneezing, sore throat and trouble swallowing.   Eyes: Negative for itching and visual disturbance.  Respiratory: Negative for cough.   Cardiovascular: Negative for chest pain, palpitations and leg swelling.  Gastrointestinal: Negative for nausea, diarrhea, blood in stool and abdominal distention.  Genitourinary: Negative for frequency and hematuria.  Musculoskeletal: Positive for back pain. Negative for joint swelling, gait problem and neck pain.  Skin: Negative for rash.  Neurological: Negative for dizziness, tremors, speech difficulty and weakness.  Psychiatric/Behavioral: Positive for decreased concentration. Negative for sleep disturbance, dysphoric mood and agitation. The patient is nervous/anxious.     Objective:  BP 110/62 mmHg  Pulse 74  Wt 207 lb (93.895 kg)  SpO2 97%  BP Readings from Last 3 Encounters:  06/06/15 110/62  02/28/15 110/76  11/08/14 120/70    Wt Readings from Last 3 Encounters:  06/06/15 207 lb (93.895 kg)  02/28/15 210 lb (95.255 kg)  11/08/14 209 lb (94.802 kg)    Physical Exam  Constitutional: He is oriented to person, place, and time. He appears well-developed and well-nourished. No distress.  HENT:  Head: Normocephalic and atraumatic.  Right Ear: External ear normal.  Left Ear: External ear normal.  Nose: Nose  normal.  Mouth/Throat: Oropharynx is clear and moist. No oropharyngeal exudate.  Eyes: Conjunctivae and EOM are normal. Pupils are equal, round, and reactive to light. Right eye exhibits no discharge. Left eye exhibits no discharge. No scleral icterus.  Neck: Normal range of motion. Neck supple. No JVD present. No tracheal deviation present. No thyromegaly present.  Cardiovascular: Normal rate, regular rhythm, normal heart sounds and intact distal pulses.  Exam reveals no gallop and no friction rub.   No murmur heard. Pulmonary/Chest: Effort normal and breath sounds normal. No stridor. No respiratory distress. He has no wheezes. He has no rales. He exhibits no tenderness.  Abdominal: Soft. Bowel sounds are normal. He exhibits no distension and no mass. There is no tenderness. There is no rebound and no guarding.  Genitourinary: Rectum normal, prostate normal and penis normal. Guaiac negative stool. No penile tenderness.  Musculoskeletal: Normal range of motion. He exhibits no edema or tenderness.  Lymphadenopathy:    He has no cervical adenopathy.  Neurological: He is alert and oriented to person, place, and time. He has normal reflexes. No cranial nerve deficit. He exhibits normal muscle tone. Coordination normal.  Skin: Skin is warm and dry. No rash noted. He is not diaphoretic. No erythema. No pallor.  Psychiatric: He has a normal mood and affect. His behavior is normal. Judgment and thought content normal.    Lab Results  Component Value Date   WBC 8.8 08/07/2014   HGB 12.6* 08/07/2014   HCT 39.6 08/07/2014   PLT 256.0 08/07/2014   GLUCOSE 101* 08/07/2014   CHOL 188 08/07/2014   TRIG 50.0 08/07/2014   HDL 84.20 08/07/2014   LDLDIRECT 127.1 08/30/2012   LDLCALC 94 08/07/2014   ALT 72* 08/07/2014   AST 42* 08/07/2014   NA 142 08/07/2014   K 4.9 08/07/2014   CL 105 08/07/2014   CREATININE 1.3 08/07/2014   BUN 17 08/07/2014   CO2 28 08/07/2014   TSH 1.50 08/07/2014   PSA 0.75  08/07/2014    Ct Abdomen Pelvis Wo Contrast  10/22/2010   Clinical Data: Hematuria.  Pelvic pain and dysuria.  Evaluate for urinary tract stone   CT ABDOMEN AND PELVIS WITHOUT CONTRAST   Technique:  Multidetector CT imaging of the abdomen and pelvis was performed following the standard protocol without intravenous contrast.   Comparison: 08/06/2008   Findings: The lung bases are clear.   No calcifications are seen in the region of the kidneys, along the course of either ureter or within the bladder lumen to suggest the presence of urinary tract stones.   Solid organs all have an unremarkable noncontrasted appearance.  No definite focal bowel abnormalities are seen.  No pathologic adenopathy or intra-abdominal or pelvic fluid is noted. Extraabdominal soft tissue planes are maintained.  Bone windows reveal no worrisome bony abnormalities.   IMPRESSION: No urinary tract calcifications apparent.  Otherwise unremarkable noncontrasted exam  Provider: Dicie Beam   Assessment & Plan:   Diagnoses and all orders for this visit:  PPD positive -     DG Chest 2 View -     Basic metabolic panel; Future -     Hepatic function panel; Future -     CBC with Differential/Platelet; Future -     Lipid panel; Future -     PSA; Future -     TSH; Future -     Urinalysis; Future -     Testosterone; Future -     HIV 1 RNA quant-no reflex-bld; Future  Well adult exam -     Basic metabolic panel; Future -     Hepatic function panel; Future -     CBC with Differential/Platelet; Future -     Lipid panel; Future -     PSA; Future -     TSH; Future -     Urinalysis; Future -     Testosterone; Future -     HIV 1 RNA quant-no reflex-bld; Future  Other specified hypothyroidism -     Basic metabolic panel; Future -     Hepatic function panel; Future -     CBC with Differential/Platelet; Future -     Lipid panel; Future -     PSA; Future -     TSH; Future -     Urinalysis; Future -     Testosterone; Future -      HIV 1 RNA quant-no reflex-bld; Future  Other orders -     HYDROcodone-acetaminophen (NORCO) 10-325 MG per tablet; TAKE 1 TABLET BY MOUTH EVERY 6 HOURS AS NEEDED FOR PAIN -     Discontinue: amphetamine-dextroamphetamine (ADDERALL) 10 MG tablet; Take 1 tablet (10 mg total) by mouth 2 (two) times daily with breakfast and lunch. Please fill on or after 06/06/15 -     Discontinue: amphetamine-dextroamphetamine (ADDERALL) 10 MG tablet; Take 1 tablet (10 mg total) by mouth 2 (two) times daily with breakfast and lunch. Please fill on or after 06/06/15 -     Discontinue: amphetamine-dextroamphetamine (ADDERALL) 10 MG tablet; Take 1 tablet (10 mg total) by mouth 2 (two) times daily with breakfast and lunch. Please fill on or after 07/06/15 -     amphetamine-dextroamphetamine (ADDERALL) 10 MG tablet; Take 1 tablet (10 mg total) by mouth 2 (two) times daily with breakfast and lunch. Please fill on or after 08/06/15  I have discontinued Mr. Mcgee amphetamine-dextroamphetamine, amphetamine-dextroamphetamine, and amphetamine-dextroamphetamine. I have also changed his amphetamine-dextroamphetamine. Additionally, I am having him maintain his b complex vitamins, Flax Seeds, Cholecalciferol, ANDROGEL PUMP, buPROPion, glycopyrrolate, hyoscyamine, Vitamin D (Ergocalciferol), amLODipine-olmesartan, acyclovir, diphenoxylate-atropine, tadalafil, ibuprofen, Eluxadoline, alprazolam, hydrOXYzine, and HYDROcodone-acetaminophen.  Meds ordered this encounter  Medications  . HYDROcodone-acetaminophen (NORCO) 10-325 MG per tablet    Sig: TAKE 1 TABLET BY MOUTH EVERY 6 HOURS AS NEEDED FOR PAIN    Dispense:  120 tablet    Refill:  0  . DISCONTD: amphetamine-dextroamphetamine (ADDERALL) 10 MG tablet    Sig: Take 1 tablet (10 mg total) by mouth 2 (two) times daily with breakfast and lunch. Please fill on or after 06/06/15    Dispense:  180 tablet    Refill:  0  . DISCONTD: amphetamine-dextroamphetamine (ADDERALL) 10 MG tablet     Sig: Take 1 tablet (10 mg total) by mouth 2 (two) times daily with breakfast and lunch. Please fill on or after 06/06/15    Dispense:  60 tablet  Refill:  0  . DISCONTD: amphetamine-dextroamphetamine (ADDERALL) 10 MG tablet    Sig: Take 1 tablet (10 mg total) by mouth 2 (two) times daily with breakfast and lunch. Please fill on or after 07/06/15    Dispense:  60 tablet    Refill:  0  . amphetamine-dextroamphetamine (ADDERALL) 10 MG tablet    Sig: Take 1 tablet (10 mg total) by mouth 2 (two) times daily with breakfast and lunch. Please fill on or after 08/06/15    Dispense:  60 tablet    Refill:  0     Follow-up: Return in about 3 months (around 09/05/2015) for Wellness Exam.  Sonda Primes, MD

## 2015-06-09 LAB — HIV-1 RNA QUANT-NO REFLEX-BLD: HIV 1 RNA Quant: <20 copies/mL (ref <20)

## 2015-06-25 ENCOUNTER — Other Ambulatory Visit: Payer: Self-pay | Admitting: Internal Medicine

## 2015-06-28 ENCOUNTER — Other Ambulatory Visit: Payer: Self-pay | Admitting: Internal Medicine

## 2015-06-30 NOTE — Telephone Encounter (Signed)
Please advise, thanks.

## 2015-07-03 NOTE — Telephone Encounter (Signed)
Xanax rx called to [facility]

## 2015-07-24 ENCOUNTER — Other Ambulatory Visit: Payer: Self-pay | Admitting: Internal Medicine

## 2015-08-29 ENCOUNTER — Other Ambulatory Visit: Payer: Self-pay | Admitting: Internal Medicine

## 2015-09-03 ENCOUNTER — Ambulatory Visit (INDEPENDENT_AMBULATORY_CARE_PROVIDER_SITE_OTHER): Payer: BLUE CROSS/BLUE SHIELD | Admitting: Internal Medicine

## 2015-09-03 ENCOUNTER — Encounter: Payer: Self-pay | Admitting: Internal Medicine

## 2015-09-03 VITALS — BP 120/78 | HR 93 | Wt 211.0 lb

## 2015-09-03 DIAGNOSIS — E291 Testicular hypofunction: Secondary | ICD-10-CM | POA: Diagnosis not present

## 2015-09-03 DIAGNOSIS — F909 Attention-deficit hyperactivity disorder, unspecified type: Secondary | ICD-10-CM | POA: Diagnosis not present

## 2015-09-03 DIAGNOSIS — K589 Irritable bowel syndrome without diarrhea: Secondary | ICD-10-CM | POA: Diagnosis not present

## 2015-09-03 DIAGNOSIS — I10 Essential (primary) hypertension: Secondary | ICD-10-CM

## 2015-09-03 DIAGNOSIS — M5440 Lumbago with sciatica, unspecified side: Secondary | ICD-10-CM

## 2015-09-03 DIAGNOSIS — G8929 Other chronic pain: Secondary | ICD-10-CM

## 2015-09-03 DIAGNOSIS — R071 Chest pain on breathing: Secondary | ICD-10-CM

## 2015-09-03 DIAGNOSIS — K591 Functional diarrhea: Secondary | ICD-10-CM

## 2015-09-03 DIAGNOSIS — F988 Other specified behavioral and emotional disorders with onset usually occurring in childhood and adolescence: Secondary | ICD-10-CM

## 2015-09-03 MED ORDER — GLYCOPYRROLATE 2 MG PO TABS: 2.0000 mg | ORAL_TABLET | Freq: Two times a day (BID) | ORAL | Status: DC

## 2015-09-03 MED ORDER — AMPHETAMINE-DEXTROAMPHETAMINE 10 MG PO TABS: 10.0000 mg | ORAL_TABLET | Freq: Two times a day (BID) | ORAL | Status: DC

## 2015-09-03 MED ORDER — HYDROCODONE-ACETAMINOPHEN 10-325 MG PO TABS: ORAL_TABLET | ORAL | Status: DC

## 2015-09-03 MED ORDER — ELUXADOLINE 100 MG PO TABS: 100.0000 mg | ORAL_TABLET | Freq: Two times a day (BID) | ORAL | Status: DC

## 2015-09-03 NOTE — Assessment & Plan Note (Signed)
Chronic Azor 

## 2015-09-03 NOTE — Assessment & Plan Note (Signed)
Labs

## 2015-09-03 NOTE — Assessment & Plan Note (Signed)
On Adderall 

## 2015-09-03 NOTE — Assessment & Plan Note (Signed)
On gluten and milk free diet  IBS-D  6.16: Viberzi - 100 mg bid - doing well

## 2015-09-03 NOTE — Assessment & Plan Note (Signed)
Chronic Norco prn  Potential benefits of a long term opioids use as well as potential risks (i.e. addiction risk, apnea etc) and complications (i.e. Somnolence, constipation and others) were explained to the patient and were aknowledged. 

## 2015-09-03 NOTE — Assessment & Plan Note (Signed)
Better on Viberzi

## 2015-09-03 NOTE — Progress Notes (Signed)
Pre visit review using our clinic review tool, if applicable. No additional management support is needed unless otherwise documented below in the visit note. 

## 2015-09-03 NOTE — Progress Notes (Signed)
Subjective:  Patient ID: Mark Mcgee, male    DOB: 11/18/1965  Age: 49 y.o. MRN: [id number]  CC: Annual Exam   HPI Mark Mcgee presents for an IBS, LBP, anxiety and depression f/u.  Outpatient Prescriptions Prior to Visit  Medication Sig Dispense Refill  . acyclovir (ZOVIRAX) 400 MG tablet TAKE 1 TABLET BY MOUTH FOUR TIMES DAILY 28 tablet 0  . alprazolam (XANAX) 2 MG tablet TAKE 1 TABLET BY MOUTH THREE TIMES DAILY 90 tablet 1  . amLODipine-olmesartan (AZOR) 10-40 MG per tablet Take 1 tablet by mouth daily. 90 tablet 3  . amphetamine-dextroamphetamine (ADDERALL) 10 MG tablet Take 1 tablet (10 mg total) by mouth 2 (two) times daily with breakfast and lunch. Please fill on or after 08/06/15 60 tablet 0  . ANDROGEL PUMP 20.25 MG/ACT (1.62%) GEL APPLY 4 PUMPS ON THE SKIN EVERY MORNING 450 g 0  . b complex vitamins tablet Take 1 tablet by mouth daily.      [name] buPROPion (WELLBUTRIN SR) 150 MG 12 hr tablet TAKE 1 TABLET BY MOUTH TWICE DAILY 180 tablet 5  . Cholecalciferol 1000 UNITS tablet Take 2,000 Units by mouth daily.      . diphenoxylate-atropine (LOMOTIL) 2.5-0.025 MG per tablet TAKE 1 OR 2 TABLETS BY MOUTH 4 TIMES DAILY AS NEEDED FOR DIARRHEA OR LOOSE STOOLS MAX OF 8 TABLETS PER DAY 60 tablet 3  . Eluxadoline (VIBERZI) 100 MG TABS Take 100 mg by mouth 2 (two) times daily. 60 tablet 5  . Flaxseed, Linseed, (FLAX SEEDS) POWD Take by mouth 2 (two) times daily.      [name] glycopyrrolate (ROBINUL) 2 MG tablet Take 1 tablet (2 mg total) by mouth 2 (two) times daily. 180 tablet 3  . HYDROcodone-acetaminophen (NORCO) 10-325 MG per tablet TAKE 1 TABLET BY MOUTH EVERY 6 HOURS AS NEEDED FOR PAIN 120 tablet 0  . hydrOXYzine (ATARAX/VISTARIL) 50 MG tablet TAKE 1-2 TABLETS BY MOUTH TWICE DAILY AS NEEDED FOR ITCHING OR FOR NAUSEA 30 tablet 1  . hyoscyamine (LEVSIN, ANASPAZ) 0.125 MG tablet TAKE 1 TO 2 TABLETS BY MOUTH EVERY 4 HOURS AS NEEDED 180 tablet 1  . ibuprofen (ADVIL,MOTRIN) 400 MG tablet Take 1  tablet (400 mg total) by mouth 4 (four) times daily as needed. 60 tablet 5  . Vitamin D, Ergocalciferol, (DRISDOL) 50000 UNITS CAPS capsule TAKE 1 CAPSULE BY MOUTH ONCE A WEEK. 8 capsule 0  . acyclovir (ZOVIRAX) 400 MG tablet TAKE 1 TABLET BY MOUTH FOUR TIMES DAILY 28 tablet 1  . alprazolam (XANAX) 2 MG tablet TAKE 1 TABLET BY MOUTH THREE TIMES DAILY 90 tablet 2  . tadalafil (CIALIS) 5 MG tablet Take 1 tablet (5 mg total) by mouth daily. 90 tablet 3   No facility-administered medications prior to visit.    ROS Review of Systems  Constitutional: Negative for appetite change, fatigue and unexpected weight change.  HENT: Negative for congestion, dental problem, nosebleeds, sneezing, sore throat and trouble swallowing.   Eyes: Negative for itching and visual disturbance.  Respiratory: Negative for cough.   Cardiovascular: Negative for chest pain, palpitations and leg swelling.  Gastrointestinal: Positive for abdominal pain and diarrhea. Negative for nausea, vomiting, blood in stool and abdominal distention.  Genitourinary: Positive for urgency. Negative for frequency, hematuria and decreased urine volume.  Musculoskeletal: Positive for back pain, arthralgias and neck pain. Negative for joint swelling and gait problem.  Skin: Negative for rash.  Neurological: Negative for dizziness, tremors, speech difficulty and weakness.  Psychiatric/Behavioral:  Negative for suicidal ideas, sleep disturbance, dysphoric mood and agitation. The patient is nervous/anxious.     Objective:  BP 120/78 mmHg  Pulse 93  Wt 211 lb (95.709 kg)  SpO2 98%  BP Readings from Last 3 Encounters:  09/03/15 120/78  06/06/15 110/62  02/28/15 110/76    Wt Readings from Last 3 Encounters:  09/03/15 211 lb (95.709 kg)  06/06/15 207 lb (93.895 kg)  02/28/15 210 lb (95.255 kg)    Physical Exam  Constitutional: He is oriented to person, place, and time. He appears well-developed. No distress.  NAD  HENT:    Mouth/Throat: Oropharynx is clear and moist.  Eyes: Conjunctivae are normal. Pupils are equal, round, and reactive to light.  Neck: Normal range of motion. No JVD present. No thyromegaly present.  Cardiovascular: Normal rate, regular rhythm, normal heart sounds and intact distal pulses.  Exam reveals no gallop and no friction rub.   No murmur heard. Pulmonary/Chest: Effort normal and breath sounds normal. No respiratory distress. He has no wheezes. He has no rales. He exhibits no tenderness.  Abdominal: Soft. Bowel sounds are normal. He exhibits no distension and no mass. There is no tenderness. There is no rebound and no guarding.  Musculoskeletal: Normal range of motion. He exhibits no edema or tenderness.  Lymphadenopathy:    He has no cervical adenopathy.  Neurological: He is alert and oriented to person, place, and time. He has normal reflexes. No cranial nerve deficit. He exhibits normal muscle tone. He displays a negative Romberg sign. Coordination and gait normal.  Skin: Skin is warm and dry. No rash noted.  Psychiatric: He has a normal mood and affect. His behavior is normal. Judgment and thought content normal.    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 EXAM: CHEST  2 VIEW COMPARISON:  08/05/2008. FINDINGS: The heart size and mediastinal contours are within normal limits. Both lungs are clear. The visualized skeletal structures are unremarkable. IMPRESSION: No  active cardiopulmonary disease. Electronically Signed   By: Maisie Fushomas  Register   On: 06/06/2015 17:06    Assessment & Plan:   There are no diagnoses linked to this encounter. I am having Mr. Idamae LusherGiordano maintain his b complex vitamins, Flax Seeds, Cholecalciferol, ANDROGEL PUMP, glycopyrrolate, hyoscyamine, Vitamin D (Ergocalciferol), amLODipine-olmesartan, diphenoxylate-atropine, tadalafil, ibuprofen, Eluxadoline, HYDROcodone-acetaminophen, amphetamine-dextroamphetamine, buPROPion, alprazolam, acyclovir, hydrOXYzine, and CIALIS.  Meds ordered this encounter  Medications  . CIALIS 5 MG tablet    Sig: Take 1 tablet by mouth daily.     Follow-up: No Follow-up on file.  Sonda PrimesAlex Plotnikov, MD

## 2015-09-03 NOTE — Assessment & Plan Note (Signed)
Norco

## 2015-09-05 ENCOUNTER — Other Ambulatory Visit: Payer: Self-pay | Admitting: *Deleted

## 2015-09-05 MED ORDER — GLYCOPYRROLATE 2 MG PO TABS: 2.0000 mg | ORAL_TABLET | Freq: Two times a day (BID) | ORAL | Status: DC

## 2015-09-08 ENCOUNTER — Encounter: Payer: BLUE CROSS/BLUE SHIELD | Admitting: Internal Medicine

## 2015-10-20 ENCOUNTER — Other Ambulatory Visit: Payer: Self-pay | Admitting: Internal Medicine

## 2015-10-21 NOTE — Telephone Encounter (Signed)
Please advise. Thanks.  

## 2015-10-22 ENCOUNTER — Other Ambulatory Visit: Payer: Self-pay | Admitting: General Practice

## 2015-10-23 ENCOUNTER — Telehealth: Payer: Self-pay | Admitting: *Deleted

## 2015-10-23 NOTE — Telephone Encounter (Signed)
Rf req for Alprazolam 2 mg 1 po tid. #90. Last filled Ok to Rf?

## 2015-10-23 NOTE — Telephone Encounter (Signed)
OK to fill this prescription with additional refills x3 Thank you!  

## 2015-10-24 MED ORDER — ALPRAZOLAM 2 MG PO TABS: 2.0000 mg | ORAL_TABLET | Freq: Three times a day (TID) | ORAL | Status: DC

## 2015-10-24 NOTE — Telephone Encounter (Signed)
Called refill. Into [facility] had to leave on pharmacy VM...lmb

## 2015-11-20 ENCOUNTER — Telehealth: Payer: Self-pay | Admitting: *Deleted

## 2015-11-20 MED ORDER — AMLODIPINE-OLMESARTAN 10-40 MG PO TABS: 1.0000 | ORAL_TABLET | Freq: Every day | ORAL | Status: DC

## 2015-11-20 NOTE — Telephone Encounter (Signed)
Left msg on triage stating pt had transfer to their pharmacy. He is needing refills on his Azor. Sending electronically...[name]

## 2015-12-03 ENCOUNTER — Ambulatory Visit: Payer: BLUE CROSS/BLUE SHIELD | Admitting: Internal Medicine

## 2015-12-19 ENCOUNTER — Other Ambulatory Visit (INDEPENDENT_AMBULATORY_CARE_PROVIDER_SITE_OTHER): Payer: PRIVATE HEALTH INSURANCE

## 2015-12-19 ENCOUNTER — Encounter: Payer: Self-pay | Admitting: Internal Medicine

## 2015-12-19 ENCOUNTER — Ambulatory Visit (INDEPENDENT_AMBULATORY_CARE_PROVIDER_SITE_OTHER): Payer: PRIVATE HEALTH INSURANCE | Admitting: Internal Medicine

## 2015-12-19 VITALS — BP 120/60 | HR 102 | Wt 203.0 lb

## 2015-12-19 DIAGNOSIS — R071 Chest pain on breathing: Secondary | ICD-10-CM

## 2015-12-19 DIAGNOSIS — F988 Other specified behavioral and emotional disorders with onset usually occurring in childhood and adolescence: Secondary | ICD-10-CM

## 2015-12-19 DIAGNOSIS — I10 Essential (primary) hypertension: Secondary | ICD-10-CM

## 2015-12-19 DIAGNOSIS — Z7721 Contact with and (suspected) exposure to potentially hazardous body fluids: Secondary | ICD-10-CM

## 2015-12-19 DIAGNOSIS — F909 Attention-deficit hyperactivity disorder, unspecified type: Secondary | ICD-10-CM | POA: Diagnosis not present

## 2015-12-19 DIAGNOSIS — K591 Functional diarrhea: Secondary | ICD-10-CM

## 2015-12-19 DIAGNOSIS — E291 Testicular hypofunction: Secondary | ICD-10-CM | POA: Diagnosis not present

## 2015-12-19 DIAGNOSIS — K589 Irritable bowel syndrome without diarrhea: Secondary | ICD-10-CM

## 2015-12-19 LAB — CBC WITH DIFFERENTIAL/PLATELET
BASOS PCT: 0.5 % (ref 0.0–3.0)
Basophils Absolute: 0 10*3/uL (ref 0.0–0.1)
EOS ABS: 0.2 10*3/uL (ref 0.0–0.7)
Eosinophils Relative: 2.6 % (ref 0.0–5.0)
HEMATOCRIT: 39.7 % (ref 39.0–52.0)
Hemoglobin: 12.9 g/dL — ABNORMAL LOW (ref 13.0–17.0)
LYMPHS PCT: 23.6 % (ref 12.0–46.0)
Lymphs Abs: 1.4 10*3/uL (ref 0.7–4.0)
MCHC: 32.3 g/dL (ref 30.0–36.0)
MCV: 77.2 fl — ABNORMAL LOW (ref 78.0–100.0)
Monocytes Absolute: 0.4 10*3/uL (ref 0.1–1.0)
Monocytes Relative: 7.1 % (ref 3.0–12.0)
NEUTROS ABS: 3.8 10*3/uL (ref 1.4–7.7)
Neutrophils Relative %: 66.2 % (ref 43.0–77.0)
PLATELETS: 321 10*3/uL (ref 150.0–400.0)
RBC: 5.15 Mil/uL (ref 4.22–5.81)
RDW: 14.5 % (ref 11.5–15.5)
WBC: 5.8 10*3/uL (ref 4.0–10.5)

## 2015-12-19 LAB — BASIC METABOLIC PANEL
BUN: 25 mg/dL — ABNORMAL HIGH (ref 6–23)
CALCIUM: 10 mg/dL (ref 8.4–10.5)
CO2: 26 mEq/L (ref 19–32)
Chloride: 103 mEq/L (ref 96–112)
Creatinine, Ser: 1.09 mg/dL (ref 0.40–1.50)
GFR: 76.52 mL/min (ref >60.00)
Glucose, Bld: 103 mg/dL — ABNORMAL HIGH (ref 70–99)
Potassium: 4.8 mEq/L (ref 3.5–5.1)
SODIUM: 141 meq/L (ref 135–145)

## 2015-12-19 LAB — TSH: TSH: 1.06 u[IU]/mL (ref 0.35–4.50)

## 2015-12-19 LAB — TESTOSTERONE: Testosterone: 218.63 ng/dL — ABNORMAL LOW (ref 300.00–890.00)

## 2015-12-19 MED ORDER — HYDROCODONE-ACETAMINOPHEN 10-325 MG PO TABS: ORAL_TABLET | ORAL | Status: DC

## 2015-12-19 MED ORDER — AMPHETAMINE-DEXTROAMPHETAMINE 10 MG PO TABS: 10.0000 mg | ORAL_TABLET | Freq: Two times a day (BID) | ORAL | Status: DC

## 2015-12-19 MED ORDER — DIPHENOXYLATE-ATROPINE 2.5-0.025 MG PO TABS: ORAL_TABLET | ORAL | Status: DC

## 2015-12-19 MED ORDER — ALPRAZOLAM 2 MG PO TABS: 2.0000 mg | ORAL_TABLET | Freq: Three times a day (TID) | ORAL | Status: DC

## 2015-12-19 NOTE — Progress Notes (Signed)
Pre visit review using our clinic review tool, if applicable. No additional management support is needed unless otherwise documented below in the visit note. 

## 2015-12-19 NOTE — Progress Notes (Signed)
Subjective:  Patient ID: Mark Mcgee, male    DOB: 10-22-1965  Age: 50 y.o. MRN: [id number]  CC: No chief complaint on file.   HPI Mark Redoaul A Stines presents for IBS-D, anxiety, TMJ, depression f/u.  Outpatient Prescriptions Prior to Visit  Medication Sig Dispense Refill  . acyclovir (ZOVIRAX) 400 MG tablet TAKE 1 TABLET BY MOUTH FOUR TIMES DAILY 28 tablet 0  . alprazolam (XANAX) 2 MG tablet Take 1 tablet (2 mg total) by mouth 3 (three) times daily. 90 tablet 3  . amLODipine-olmesartan (AZOR) 10-40 MG tablet Take 1 tablet by mouth daily. 90 tablet 3  . amphetamine-dextroamphetamine (ADDERALL) 10 MG tablet Take 1 tablet (10 mg total) by mouth 2 (two) times daily with breakfast and lunch. Please fill on or after 11/04/15 60 tablet 0  . ANDROGEL PUMP 20.25 MG/ACT (1.62%) GEL APPLY 4 PUMPS ON THE SKIN EVERY MORNING 450 g 0  . b complex vitamins tablet Take 1 tablet by mouth daily.      [name]. buPROPion (WELLBUTRIN SR) 150 MG 12 hr tablet TAKE 1 TABLET BY MOUTH TWICE DAILY 180 tablet 5  . Cholecalciferol 1000 UNITS tablet Take 2,000 Units by mouth daily.      [name]. CIALIS 5 MG tablet Take 1 tablet by mouth daily.    . diphenoxylate-atropine (LOMOTIL) 2.5-0.025 MG per tablet TAKE 1 OR 2 TABLETS BY MOUTH 4 TIMES DAILY AS NEEDED FOR DIARRHEA OR LOOSE STOOLS MAX OF 8 TABLETS PER DAY 60 tablet 3  . Eluxadoline (VIBERZI) 100 MG TABS Take 100 mg by mouth 2 (two) times daily. 60 tablet 5  . Flaxseed, Linseed, (FLAX SEEDS) POWD Take by mouth 2 (two) times daily.      [name]. glycopyrrolate (ROBINUL) 2 MG tablet Take 1 tablet (2 mg total) by mouth 2 (two) times daily. 180 tablet 1  . HYDROcodone-acetaminophen (NORCO) 10-325 MG tablet TAKE 1 TABLET BY MOUTH EVERY 6 HOURS AS NEEDED FOR PAIN 120 tablet 0  . hydrOXYzine (ATARAX/VISTARIL) 50 MG tablet TAKE 1-2 TABLETS BY MOUTH TWICE DAILY AS NEEDED FOR ITCHING OR FOR NAUSEA 30 tablet 1  . hyoscyamine (LEVSIN, ANASPAZ) 0.125 MG tablet TAKE 1 TO 2 TABLETS BY MOUTH EVERY 4  HOURS AS NEEDED 180 tablet 1  . ibuprofen (ADVIL,MOTRIN) 400 MG tablet Take 1 tablet (400 mg total) by mouth 4 (four) times daily as needed. 60 tablet 5  . Vitamin D, Ergocalciferol, (DRISDOL) 50000 UNITS CAPS capsule TAKE 1 CAPSULE BY MOUTH ONCE A WEEK. 8 capsule 0  . tadalafil (CIALIS) 5 MG tablet Take 1 tablet (5 mg total) by mouth daily. 90 tablet 3   No facility-administered medications prior to visit.    ROS Review of Systems  Constitutional: Negative for appetite change, fatigue and unexpected weight change.  HENT: Negative for congestion, nosebleeds, sneezing, sore throat and trouble swallowing.   Eyes: Negative for itching and visual disturbance.  Respiratory: Negative for cough.   Cardiovascular: Negative for chest pain, palpitations and leg swelling.  Gastrointestinal: Negative for nausea, diarrhea, blood in stool and abdominal distention.  Genitourinary: Negative for frequency and hematuria.  Musculoskeletal: Negative for back pain, joint swelling, gait problem and neck pain.  Skin: Negative for rash.  Neurological: Negative for dizziness, tremors, speech difficulty and weakness.  Psychiatric/Behavioral: Negative for sleep disturbance, dysphoric mood, decreased concentration and agitation. The patient is not nervous/anxious.     Objective:  BP 120/60 mmHg  Pulse 102  Wt 203 lb (92.08 kg)  SpO2 97%  BP Readings from Last 3 Encounters:  12/19/15 120/60  09/03/15 120/78  06/06/15 110/62    Wt Readings from Last 3 Encounters:  12/19/15 203 lb (92.08 kg)  09/03/15 211 lb (95.709 kg)  06/06/15 207 lb (93.895 kg)    Physical Exam  Constitutional: He is oriented to person, place, and time. He appears well-developed. No distress.  NAD  HENT:  Mouth/Throat: Oropharynx is clear and moist.  Eyes: Conjunctivae are normal. Pupils are equal, round, and reactive to light.  Neck: Normal range of motion. No JVD present. No thyromegaly present.  Cardiovascular: Normal rate,  regular rhythm, normal heart sounds and intact distal pulses.  Exam reveals no gallop and no friction rub.   No murmur heard. Pulmonary/Chest: Effort normal and breath sounds normal. No respiratory distress. He has no wheezes. He has no rales. He exhibits no tenderness.  Abdominal: Soft. Bowel sounds are normal. He exhibits no distension and no mass. There is no tenderness. There is no rebound and no guarding.  Musculoskeletal: Normal range of motion. He exhibits tenderness. He exhibits no edema.  Lymphadenopathy:    He has no cervical adenopathy.  Neurological: He is alert and oriented to person, place, and time. He has normal reflexes. No cranial nerve deficit. He exhibits normal muscle tone. He displays a negative Romberg sign. Coordination and gait normal.  Skin: Skin is warm and dry. No rash noted.  Psychiatric: He has a normal mood and affect. His behavior is normal. Judgment and thought content normal.    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 EXAM: CHEST  2 VIEW COMPARISON:  08/05/2008. FINDINGS: The heart size and mediastinal contours are within normal limits. Both lungs are clear. The visualized skeletal structures are unremarkable. IMPRESSION: No active cardiopulmonary disease. Electronically Signed   By: Maisie Fus  Register   On: 06/06/2015 17:06    Assessment & Plan:   There are no diagnoses linked to this encounter. I am having Mr. Trumpower maintain his b  complex vitamins, Flax Seeds, Cholecalciferol, ANDROGEL PUMP, hyoscyamine, Vitamin D (Ergocalciferol), diphenoxylate-atropine, tadalafil, ibuprofen, buPROPion, hydrOXYzine, CIALIS, Eluxadoline, amphetamine-dextroamphetamine, HYDROcodone-acetaminophen, glycopyrrolate, acyclovir, alprazolam, and amLODipine-olmesartan.  No orders of the defined types were placed in this encounter.     Follow-up: No Follow-up on file.  Sonda Primes, MD

## 2015-12-22 ENCOUNTER — Telehealth: Payer: Self-pay

## 2015-12-22 NOTE — Telephone Encounter (Signed)
PA forms for Hydroxyzine, Bupropion, Azor, Adderall, Viberzi, and Hydrocodone APAP completed and faxed back to [phone]

## 2015-12-23 LAB — HIV-1 RNA QUANT-NO REFLEX-BLD

## 2015-12-23 NOTE — Telephone Encounter (Signed)
Additional clinical information given via phone call from [employer] insurance company

## 2015-12-23 NOTE — Telephone Encounter (Signed)
PA not required on Adderall per [employer] company

## 2015-12-26 ENCOUNTER — Other Ambulatory Visit: Payer: Self-pay | Admitting: Internal Medicine

## 2015-12-26 NOTE — Telephone Encounter (Signed)
Azor Approved through 12/22/2016

## 2015-12-27 ENCOUNTER — Encounter: Payer: Self-pay | Admitting: Internal Medicine

## 2015-12-29 ENCOUNTER — Other Ambulatory Visit: Payer: Self-pay | Admitting: Internal Medicine

## 2015-12-29 MED ORDER — ACYCLOVIR 400 MG PO TABS: 400.0000 mg | ORAL_TABLET | Freq: Four times a day (QID) | ORAL | Status: DC

## 2015-12-29 MED ORDER — CIALIS 5 MG PO TABS: 5.0000 mg | ORAL_TABLET | Freq: Every day | ORAL | Status: DC

## 2015-12-29 MED ORDER — TESTOSTERONE 20.25 MG/ACT (1.62%) TD GEL: 2.0000 | TRANSDERMAL | Status: DC

## 2015-12-31 ENCOUNTER — Telehealth: Payer: Self-pay | Admitting: Internal Medicine

## 2015-12-31 NOTE — Telephone Encounter (Signed)
PA initiated via CoverMyMeds Key [id number]

## 2015-12-31 NOTE — Telephone Encounter (Signed)
PA initiated via CoverMyMeds for Cialis 5 mg key [id number]

## 2015-12-31 NOTE — Telephone Encounter (Signed)
Androgel PA (see previous note)

## 2015-12-31 NOTE — Telephone Encounter (Signed)
disregard

## 2015-12-31 NOTE — Telephone Encounter (Signed)
Bupropion does not require PA, additional information provided for [employer]stateViberzi PA

## 2016-01-01 NOTE — Telephone Encounter (Signed)
Cialis APPROVED through 12/29/2016

## 2016-01-01 NOTE — Telephone Encounter (Signed)
Adrogel APPROVED without an expiration date

## 2016-01-01 NOTE — Telephone Encounter (Signed)
PA not required for Hydrocodone APAP 10/325 or Hydroxyzine  Additional clinical information submitted via phone and faxed for the 3rd time for Viberzi PA

## 2016-01-05 NOTE — Telephone Encounter (Signed)
Viberzi APPROVED through 12/30/2016

## 2016-03-09 ENCOUNTER — Other Ambulatory Visit: Payer: Self-pay | Admitting: Internal Medicine

## 2016-03-11 ENCOUNTER — Telehealth: Payer: Self-pay | Admitting: Internal Medicine

## 2016-03-11 NOTE — Telephone Encounter (Signed)
Pt request refill for Azor and Lomotil to be send to [facility].

## 2016-03-11 NOTE — Telephone Encounter (Signed)
Done

## 2016-03-12 MED ORDER — AMLODIPINE-OLMESARTAN 10-40 MG PO TABS: 1.0000 | ORAL_TABLET | Freq: Every day | ORAL | Status: DC

## 2016-03-12 NOTE — Telephone Encounter (Signed)
Sent Azor pls advise on Lomotil...lmb

## 2016-03-12 NOTE — Telephone Encounter (Signed)
Ok both Thx 

## 2016-03-15 MED ORDER — DIPHENOXYLATE-ATROPINE 2.5-0.025 MG PO TABS: ORAL_TABLET | ORAL | Status: DC

## 2016-03-15 NOTE — Addendum Note (Signed)
Addended by: Deatra JamesBRAND, Mikhael Hendriks M on: 03/15/2016 09:24 AM   Modules accepted: Orders

## 2016-03-15 NOTE — Telephone Encounter (Signed)
Faxed Lomotil script to pharmayc...[name]/lmb

## 2016-03-16 ENCOUNTER — Telehealth: Payer: Self-pay

## 2016-03-16 NOTE — Telephone Encounter (Signed)
Can you please the prior for Azor again to the pharmacy they did not get the ok yet.

## 2016-03-17 MED ORDER — AMLODIPINE-OLMESARTAN 10-40 MG PO TABS: 1.0000 | ORAL_TABLET | Freq: Every day | ORAL | Status: DC

## 2016-03-17 NOTE — Telephone Encounter (Signed)
I am not sure what is needed. I called pt for clarification. No answer. Will send eRx for Azor again.

## 2016-03-18 ENCOUNTER — Telehealth: Payer: Self-pay

## 2016-03-18 NOTE — Telephone Encounter (Signed)
Pharmacy called and said they need prior authorization on this medication amLODipine-olmesartan (AZOR) 10-40 MG tablet [[id number]][[id number]]   Phone number is: [phone] and the  GN#:[id number]

## 2016-03-19 NOTE — Telephone Encounter (Signed)
Cialis and Viberzi PA forms signed and faxed

## 2016-03-19 NOTE — Telephone Encounter (Signed)
Additional Clinical information given to [facility] [employer] via phone

## 2016-03-19 NOTE — Telephone Encounter (Signed)
[facility] for Cialis and Viberzi (Azor to follow), completed and placed on MD's desk for signature

## 2016-03-22 NOTE — Telephone Encounter (Signed)
PA for Viberzi APPROVED through 03/21/2018, Cialis has been denied due to plan exclusion. Pt advised of same and understood. Pt is requesting a written Rx to be mailed to him for Cialis and Azor so that he may shop around for medication  I will contact [employer] again for status of Azor PA

## 2016-03-22 NOTE — Telephone Encounter (Signed)
[id number] APPROVED for Azor, pt advised. Pt is now requesting written Rx for Cialis only to be mailed to him. Thanks

## 2016-03-23 MED ORDER — CIALIS 5 MG PO TABS
5.0000 mg | ORAL_TABLET | Freq: Every day | ORAL | Status: DC
Start: 2016-03-23 — End: 2017-01-26

## 2016-03-23 NOTE — Telephone Encounter (Signed)
Cialis rx printed/signed/mailed to pt. Pt informed

## 2016-03-26 ENCOUNTER — Ambulatory Visit: Payer: PRIVATE HEALTH INSURANCE | Admitting: Internal Medicine

## 2016-04-01 ENCOUNTER — Encounter: Payer: Self-pay | Admitting: Internal Medicine

## 2016-04-01 ENCOUNTER — Other Ambulatory Visit: Payer: Self-pay | Admitting: *Deleted

## 2016-04-01 MED ORDER — AMLODIPINE-OLMESARTAN 10-40 MG PO TABS: 1.0000 | ORAL_TABLET | Freq: Every day | ORAL | Status: DC

## 2016-04-20 ENCOUNTER — Encounter: Payer: Self-pay | Admitting: Internal Medicine

## 2016-04-30 ENCOUNTER — Other Ambulatory Visit: Payer: Self-pay | Admitting: Internal Medicine

## 2016-04-30 ENCOUNTER — Telehealth: Payer: Self-pay | Admitting: *Deleted

## 2016-04-30 MED ORDER — TESTOSTERONE CYPIONATE 200 MG/ML IM SOLN: 200.0000 mg | INTRAMUSCULAR | 5 refills | Status: DC

## 2016-04-30 NOTE — Telephone Encounter (Signed)
Ok Thx 

## 2016-04-30 NOTE — Telephone Encounter (Signed)
Done

## 2016-04-30 NOTE — Telephone Encounter (Signed)
Called new rx into pharmacy spoke w/suzanne gave md authorization for testosterone...[name]

## 2016-04-30 NOTE — Telephone Encounter (Signed)
Left msg on triage stating the Andro gel pump is not covered by pt insurance plan. Ned cost over $1100, wanring to change to testosterone cypionate...[name]

## 2016-05-07 ENCOUNTER — Telehealth: Payer: Self-pay | Admitting: *Deleted

## 2016-05-07 MED ORDER — AMPHETAMINE-DEXTROAMPHETAMINE 10 MG PO TABS: 10.0000 mg | ORAL_TABLET | Freq: Two times a day (BID) | ORAL | 0 refills | Status: DC

## 2016-05-07 MED ORDER — HYDROCODONE-ACETAMINOPHEN 10-325 MG PO TABS: ORAL_TABLET | ORAL | 0 refills | Status: DC

## 2016-05-07 NOTE — Telephone Encounter (Signed)
Re-printed rx's MD signed both will hold to pt come pick-up. He must bring unsigned script back...[name]/lmb

## 2016-05-07 NOTE — Telephone Encounter (Signed)
Rec'd call from pharmacist "Inetta Fermoina" she states the patient is there with scripts dated 12/19/15 for his Hydrocodone & Adderrall, but there is no signature. Inform per chart md ok & printed rx's at the ov on that day. Inform pharmacy to have pt bring script back to switch out. Must bring unsigned script back...[name]/lmb

## 2016-06-10 ENCOUNTER — Encounter: Payer: Self-pay | Admitting: Internal Medicine

## 2016-06-10 ENCOUNTER — Other Ambulatory Visit: Payer: Self-pay | Admitting: Internal Medicine

## 2016-06-11 ENCOUNTER — Other Ambulatory Visit (INDEPENDENT_AMBULATORY_CARE_PROVIDER_SITE_OTHER): Payer: Medicare PPO

## 2016-06-11 ENCOUNTER — Ambulatory Visit: Payer: PRIVATE HEALTH INSURANCE | Admitting: Internal Medicine

## 2016-06-11 ENCOUNTER — Ambulatory Visit (INDEPENDENT_AMBULATORY_CARE_PROVIDER_SITE_OTHER): Payer: Medicare PPO | Admitting: Internal Medicine

## 2016-06-11 ENCOUNTER — Encounter: Payer: Self-pay | Admitting: Internal Medicine

## 2016-06-11 VITALS — BP 124/82 | HR 73 | Temp 97.9°F | Ht 67.0 in | Wt 199.8 lb

## 2016-06-11 DIAGNOSIS — K589 Irritable bowel syndrome without diarrhea: Secondary | ICD-10-CM

## 2016-06-11 DIAGNOSIS — M609 Myositis, unspecified: Secondary | ICD-10-CM

## 2016-06-11 DIAGNOSIS — IMO0001 Reserved for inherently not codable concepts without codable children: Secondary | ICD-10-CM

## 2016-06-11 DIAGNOSIS — M791 Myalgia: Secondary | ICD-10-CM

## 2016-06-11 LAB — SEDIMENTATION RATE: Sed Rate: 2 mm/hr (ref 0–15)

## 2016-06-11 LAB — COMPREHENSIVE METABOLIC PANEL
ALK PHOS: 48 U/L (ref 39–117)
ALT: 36 U/L (ref 0–53)
AST: 20 U/L (ref 0–37)
Albumin: 4.6 g/dL (ref 3.5–5.2)
BUN: 24 mg/dL — AB (ref 6–23)
CO2: 30 mEq/L (ref 19–32)
Calcium: 9.7 mg/dL (ref 8.4–10.5)
Chloride: 102 mEq/L (ref 96–112)
Creatinine, Ser: 1.24 mg/dL (ref 0.40–1.50)
GFR: 65.81 mL/min (ref >60.00)
GLUCOSE: 103 mg/dL — AB (ref 70–99)
POTASSIUM: 5.3 meq/L — AB (ref 3.5–5.1)
SODIUM: 138 meq/L (ref 135–145)
TOTAL PROTEIN: 7.4 g/dL (ref 6.0–8.3)
Total Bilirubin: 0.7 mg/dL (ref 0.2–1.2)

## 2016-06-11 LAB — CBC
HEMATOCRIT: 44 % (ref 39.0–52.0)
Hemoglobin: 14.4 g/dL (ref 13.0–17.0)
MCHC: 32.7 g/dL (ref 30.0–36.0)
MCV: 79.3 fl (ref 78.0–100.0)
Platelets: 314 10*3/uL (ref 150.0–400.0)
RBC: 5.54 Mil/uL (ref 4.22–5.81)
RDW: 17.5 % — AB (ref 11.5–15.5)
WBC: 8.2 10*3/uL (ref 4.0–10.5)

## 2016-06-11 LAB — C-REACTIVE PROTEIN: CRP: 0.1 mg/dL — ABNORMAL LOW (ref 0.5–20.0)

## 2016-06-11 LAB — CK: Total CK: 54 U/L (ref 7–232)

## 2016-06-11 MED ORDER — AMPHETAMINE-DEXTROAMPHETAMINE 10 MG PO TABS: 10.0000 mg | ORAL_TABLET | Freq: Two times a day (BID) | ORAL | 0 refills | Status: DC

## 2016-06-11 MED ORDER — HYDROCODONE-ACETAMINOPHEN 10-325 MG PO TABS: ORAL_TABLET | ORAL | 0 refills | Status: DC

## 2016-06-11 NOTE — Progress Notes (Signed)
   Subjective:    Patient ID: Mark Mcgee, male    DOB: 03-31-1966, [age] y.o.   MRN: [id number]  HPI The patient is a 50 YO man coming in for not feeling well for the last several weeks. He is having aches in his joints and some mild muscle pain. He denies any change to diet or medication. He is not having headaches and denies cold symptoms. Had some wheat which caused him to have stomach problems at the onset which is often accompanied by joint problems. However it has never lasted this long and he sought care.   Review of Systems  Constitutional: Positive for activity change, appetite change and fatigue. Negative for chills, fever and unexpected weight change.  HENT: Negative.   Respiratory: Negative for cough, chest tightness and shortness of breath.   Cardiovascular: Negative for chest pain, palpitations and leg swelling.  Gastrointestinal: Positive for diarrhea. Negative for abdominal distention, abdominal pain, constipation, nausea and vomiting.  Musculoskeletal: Positive for arthralgias and myalgias. Negative for back pain, neck pain and neck stiffness.  Skin: Negative.   Neurological: Negative for dizziness, weakness, light-headedness and headaches.  Psychiatric/Behavioral: Negative.       Objective:   Physical Exam  Constitutional: He is oriented to person, place, and time. He appears well-developed and well-nourished.  HENT:  Head: Normocephalic and atraumatic.  Right Ear: External ear normal.  Left Ear: External ear normal.  Mouth/Throat: Oropharynx is clear and moist.  Eyes: EOM are normal.  Neck: Normal range of motion.  Cardiovascular: Normal rate and regular rhythm.   Pulmonary/Chest: Effort normal and breath sounds normal. No respiratory distress. He has no wheezes. He has no rales.  Abdominal: Soft. Bowel sounds are normal. He exhibits no distension. There is no tenderness. There is no rebound.  Musculoskeletal: He exhibits tenderness. He exhibits no edema.  Diffuse  tenderness  Neurological: He is alert and oriented to person, place, and time.  Skin: Skin is warm and dry.   Vitals:   06/11/16 1716  BP: 124/82  Pulse: 73  Temp: 97.9 F (36.6 C)  TempSrc: Oral  SpO2: 98%  Weight: 199 lb 12 oz (90.6 kg)  Height: 5\' 7"  (1.702 m)      Assessment & Plan:

## 2016-06-11 NOTE — Patient Instructions (Signed)
We have refilled the medicines today and I cannot write 3 of them per current laws but Dr. Posey ReaPlotnikov can when you get it refilled.   We are checking labs today to give Mark Mcgee some direction with your symptoms. We do not think you need antibiotics today.

## 2016-06-13 LAB — EPSTEIN-BARR VIRUS NUCLEAR ANTIGEN ANTIBODY, IGG: EBV NA IgG: 486 U/mL — ABNORMAL HIGH

## 2016-06-13 NOTE — Assessment & Plan Note (Signed)
Some of his symptoms could have started with his wheat intake however since his symptoms usually only last for 2-3 days checking labs for illness including CBC, CMP, CK, ESR, CRP. Treat as appropriate.

## 2016-06-15 NOTE — Telephone Encounter (Signed)
OV q 3 mo 

## 2016-06-17 NOTE — Telephone Encounter (Signed)
Rf phoned in.  

## 2016-06-18 ENCOUNTER — Ambulatory Visit (INDEPENDENT_AMBULATORY_CARE_PROVIDER_SITE_OTHER): Payer: 59 | Admitting: Nurse Practitioner

## 2016-06-18 ENCOUNTER — Encounter: Payer: Self-pay | Admitting: Nurse Practitioner

## 2016-06-18 ENCOUNTER — Other Ambulatory Visit: Payer: Self-pay | Admitting: *Deleted

## 2016-06-18 ENCOUNTER — Encounter: Payer: Self-pay | Admitting: *Deleted

## 2016-06-18 VITALS — BP 128/82 | HR 88 | Temp 98.4°F | Ht 67.0 in | Wt 200.0 lb

## 2016-06-18 DIAGNOSIS — R1084 Generalized abdominal pain: Secondary | ICD-10-CM

## 2016-06-18 DIAGNOSIS — R0981 Nasal congestion: Secondary | ICD-10-CM

## 2016-06-18 DIAGNOSIS — B2790 Infectious mononucleosis, unspecified without complication: Secondary | ICD-10-CM | POA: Diagnosis not present

## 2016-06-18 DIAGNOSIS — R197 Diarrhea, unspecified: Secondary | ICD-10-CM

## 2016-06-18 MED ORDER — IPRATROPIUM BROMIDE 0.03 % NA SOLN: 2.0000 | Freq: Two times a day (BID) | NASAL | 12 refills | Status: DC

## 2016-06-18 NOTE — Patient Instructions (Signed)
Infectious Mononucleosis Infectious mononucleosis is an infection caused by a virus. This illness is often called "mono." You can get mono from close contact with someone who is infected. If you have mono, you may feel tired and have a sore throat, a headache, or a fever. HOME CARE  Rest as needed.  Do not play contact sports, perform hard exercise, or lift anything that is heavy until your doctor says you can. You may need to wait a few months before playing sports. Your liver or spleen might be swollen and could get hurt.  Drink enough fluid to keep your pee (urine) clear or pale yellow.  Do not drink alcohol.  Take medicines only as told by your doctor. Do not give aspirin to children.  Eat soft foods. Cold foods such as ice cream or frozen ice pops can make your throat feel better.  If you have a sore throat, rinse your mouth (gargle) with a mixture of salt and water. Mix 1 teaspoon of salt with 1 cup of warm water.  Sucking on hard candy can help a sore throat.  Avoid kissing or sharing your drinking glass until your doctor says you are better. GET HELP IF:  Your fever does not go away after 10 days.  Your swollen glands are not back to normal after 4 weeks.  Your activity level is not back to normal after 2 months.  You have a yellow color to your eyes and skin (jaundice).  You have trouble pooping (constipation). GET HELP RIGHT AWAY IF:   You have strong pain in your belly or shoulder.  You are drooling.  You have trouble swallowing.  You have trouble breathing.  You have a stiff neck.  You have a bad headache.  You keep throwing up (vomiting).  You have twitching or shaking (convulsions).  You are confused.  You have trouble with balance.  You have signs of body fluid loss (dehydration):  Weakness.  Sunken eyes.  Pale skin.  Dry mouth.  Fast breathing or heartbeat.   This information is not intended to replace advice given to you by your  health care provider. Make sure you discuss any questions you have with your health care provider.   Document Released: 09/01/2009 Document Revised: 10/04/2014 Document Reviewed: 05/21/2014 Elsevier Interactive Patient Education 2016 Elsevier Inc.  

## 2016-06-18 NOTE — Progress Notes (Unsigned)
Pre visit review using our clinic review tool, if applicable. No additional management support is needed unless otherwise documented below in the visit note. 

## 2016-06-18 NOTE — Progress Notes (Signed)
Pre visit review using our clinic review tool, if applicable. No additional management support is needed unless otherwise documented below in the visit note. 

## 2016-06-18 NOTE — Progress Notes (Signed)
Subjective:  Patient ID: Mark Mcgee, male    DOB: May 30, 1966  Age: 50 y.o. MRN: [id number]  CC: Abdominal Pain (PT STATED UPPER ABDOMINAL, DIARRHEA, JOINT PAIN FOR ABOUT 2 WEEKS)   Abdominal Pain  This is a chronic problem. The current episode started more than 1 year ago. The onset quality is gradual. The problem occurs intermittently. The problem has been waxing and waning. The pain is located in the generalized abdominal region. The pain is at a severity of 5/10. The pain is moderate. The quality of the pain is colicky and dull. The abdominal pain does not radiate. Associated symptoms include anorexia, diarrhea and myalgias. Pertinent negatives include no belching, constipation, dysuria, fever, flatus, frequency, headaches, hematochezia, hematuria, melena, nausea, vomiting or weight loss. The pain is aggravated by eating. The pain is relieved by nothing. He has tried acetaminophen (viberzi) for the symptoms. The treatment provided mild relief. His past medical history is significant for GERD and irritable bowel syndrome.  reports excessive ETOH use 1week ago.  Outpatient Medications Prior to Visit  Medication Sig Dispense Refill  . acyclovir (ZOVIRAX) 400 MG tablet TAKE 1 TABLET BY MOUTH FOUR TIMES DAILY 30 tablet 3  . alprazolam (XANAX) 2 MG tablet TAKE 1 TABLET BY MOUTH THREE TIMES DAILY 90 tablet 0  . amLODipine-olmesartan (AZOR) 10-40 MG tablet Take 1 tablet by mouth daily. [employer] ID [id number]  Group number [id number] [address]  [place] [id number] 90 tablet 3  . amphetamine-dextroamphetamine (ADDERALL) 10 MG tablet Take 1 tablet (10 mg total) by mouth 2 (two) times daily with breakfast and lunch. 60 tablet 0  . b complex vitamins tablet Take 1 tablet by mouth daily.      [name]. buPROPion (WELLBUTRIN SR) 150 MG 12 hr tablet TAKE 1 TABLET BY MOUTH TWICE DAILY 180 tablet 5  . Cholecalciferol 1000 UNITS tablet Take 2,000 Units by mouth daily.      [name]. CIALIS 5 MG tablet Take 1 tablet  (5 mg total) by mouth daily. 90 tablet 3  . diphenoxylate-atropine (LOMOTIL) 2.5-0.025 MG tablet TAKE 1 OR 2 TABLETS BY MOUTH 4 TIMES DAILY AS NEEDED FOR DIARRHEA OR LOOSE STOOLS MAX OF 8 TABLETS PER DAY 60 tablet 3  . Flaxseed, Linseed, (FLAX SEEDS) POWD Take by mouth 2 (two) times daily.      [name]. glycopyrrolate (ROBINUL) 2 MG tablet Take 1 tablet (2 mg total) by mouth 2 (two) times daily. 180 tablet 1  . hydrOXYzine (ATARAX/VISTARIL) 50 MG tablet TAKE 1-2 TABLETS BY MOUTH TWICE DAILY AS NEEDED FOR ITCHING OR FOR NAUSEA 30 tablet 1  . hyoscyamine (LEVSIN, ANASPAZ) 0.125 MG tablet TAKE 1 TO 2 TABLETS BY MOUTH EVERY 4 HOURS AS NEEDED 180 tablet 1  . ibuprofen (ADVIL,MOTRIN) 400 MG tablet Take 1 tablet (400 mg total) by mouth 4 (four) times daily as needed. 60 tablet 5  . Multiple Vitamin (MULTIVITAMIN) capsule Take by mouth.    . testosterone cypionate (DEPOTESTOSTERONE CYPIONATE) 200 MG/ML injection Inject 1 mL (200 mg total) into the muscle every 14 (fourteen) days. 10 mL 5  . TRUVADA 200-300 MG tablet     . VIBERZI 100 MG TABS TAKE 1 TABLET BY MOUTH TWICE DAILY. 60 tablet 5  . Vitamin D, Ergocalciferol, (DRISDOL) 50000 UNITS CAPS capsule TAKE 1 CAPSULE BY MOUTH ONCE A WEEK. 8 capsule 0  . HYDROcodone-acetaminophen (NORCO) 10-325 MG tablet TAKE 1 TABLET BY MOUTH EVERY 6 HOURS AS NEEDED FOR PAIN (Patient not taking: Reported on  06/18/2016) 120 tablet 0  . tadalafil (CIALIS) 5 MG tablet Take 1 tablet (5 mg total) by mouth daily. 90 tablet 3   No facility-administered medications prior to visit.     ROS See HPI  Objective:  BP 128/82 (BP Location: Left Arm, Patient Position: Sitting, Cuff Size: Normal)   Pulse 88   Temp 98.4 F (36.9 C)   Ht 5\' 7"  (1.702 m)   Wt 200 lb (90.7 kg)   SpO2 98%   BMI 31.32 kg/m   BP Readings from Last 3 Encounters:  06/18/16 128/82  06/11/16 124/82  12/19/15 120/60    Wt Readings from Last 3 Encounters:  06/18/16 200 lb (90.7 kg)  06/11/16 199 lb 12 oz  (90.6 kg)  12/19/15 203 lb (92.1 kg)    Physical Exam  Constitutional: He is oriented to person, place, and time. No distress.  HENT:  Right Ear: Tympanic membrane, external ear and ear canal normal.  Left Ear: Tympanic membrane, external ear and ear canal normal.  Nose: Mucosal edema and rhinorrhea present. Right sinus exhibits no maxillary sinus tenderness and no frontal sinus tenderness. Left sinus exhibits no maxillary sinus tenderness and no frontal sinus tenderness.  Mouth/Throat: Uvula is midline. Mucous membranes are dry. Posterior oropharyngeal erythema present. No oropharyngeal exudate or posterior oropharyngeal edema.  Eyes: No scleral icterus.  Neck: Normal range of motion. Neck supple.  Cardiovascular: Normal rate and normal heart sounds.   Pulmonary/Chest: Effort normal and breath sounds normal.  Abdominal: Soft. Bowel sounds are normal. He exhibits no distension. There is tenderness. There is no rebound and no guarding.  Diffuse tenderness.  Musculoskeletal: Normal range of motion. He exhibits no edema.  Lymphadenopathy:    He has cervical adenopathy.  Neurological: He is alert and oriented to person, place, and time.  Vitals reviewed.   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 EXAM: CHEST  2 VIEW COMPARISON:  08/05/2008. FINDINGS: The heart size and mediastinal contours are within normal limits. Both lungs are clear. The  visualized skeletal structures are unremarkable. IMPRESSION: No active cardiopulmonary disease. Electronically Signed   By: Maisie Fus  Register   On: 06/06/2015 17:06    Assessment & Plan:   Amram was seen today for abdominal pain.  Diagnoses and all orders for this visit:  Generalized abdominal pain -     POCT Urinalysis Dipstick -     Lipase; Future -     Amylase; Future -     Cancel: CBC w/Diff; Future -     Stool Culture; Future -     Stool C-Diff Toxin Assay; Future  Diarrhea, unspecified type -     POCT Urinalysis Dipstick -     Lipase; Future -     Amylase; Future -     Cancel: CBC  w/Diff; Future -     Stool Culture; Future -     Stool C-Diff Toxin Assay; Future  Epstein Barr virus infection  Nasal sinus congestion -     ipratropium (ATROVENT) 0.03 % nasal spray; Place 2 sprays into both nostrils 2 (two) times daily. Do not use for more than 5days.   I am having Mr. Thorington start on ipratropium. I am also having him maintain his b complex vitamins, Flax Seeds, Cholecalciferol, hyoscyamine, Vitamin D (Ergocalciferol), tadalafil, ibuprofen, buPROPion, hydrOXYzine, glycopyrrolate, acyclovir, VIBERZI, diphenoxylate-atropine, CIALIS, amLODipine-olmesartan, testosterone cypionate, alprazolam, amphetamine-dextroamphetamine, HYDROcodone-acetaminophen, multivitamin, and TRUVADA.  Meds ordered this encounter  Medications  . ipratropium (ATROVENT) 0.03 % nasal spray    Sig: Place 2 sprays into both nostrils 2 (two) times daily. Do not use for more than 5days.    Dispense:  30 mL    Refill:  12    Order Specific Question:   Supervising Provider    Answer:   Tresa Garter [[id number]]    Follow-up: Return if symptoms worsen or fail to improve.  Alysia Penna, NP

## 2016-06-21 LAB — POCT URINALYSIS DIPSTICK
Bilirubin, UA: NEGATIVE
Glucose, UA: NEGATIVE
Ketones, UA: NEGATIVE
NITRITE UA: NEGATIVE
PROTEIN UA: NEGATIVE

## 2016-06-25 ENCOUNTER — Telehealth: Payer: Self-pay

## 2016-06-25 NOTE — Telephone Encounter (Signed)
PA initiated via CoverMyMeds key [id number]

## 2016-06-28 NOTE — Telephone Encounter (Signed)
PA APPROVED through 06/26/2018. Pt to be advised via letter from [employer]

## 2016-07-02 ENCOUNTER — Encounter: Payer: Self-pay | Admitting: Internal Medicine

## 2016-07-02 ENCOUNTER — Other Ambulatory Visit (INDEPENDENT_AMBULATORY_CARE_PROVIDER_SITE_OTHER): Payer: 59

## 2016-07-02 ENCOUNTER — Ambulatory Visit (INDEPENDENT_AMBULATORY_CARE_PROVIDER_SITE_OTHER): Payer: 59 | Admitting: Internal Medicine

## 2016-07-02 DIAGNOSIS — M2550 Pain in unspecified joint: Secondary | ICD-10-CM

## 2016-07-02 DIAGNOSIS — M5440 Lumbago with sciatica, unspecified side: Secondary | ICD-10-CM

## 2016-07-02 DIAGNOSIS — G8929 Other chronic pain: Secondary | ICD-10-CM

## 2016-07-02 DIAGNOSIS — K591 Functional diarrhea: Secondary | ICD-10-CM | POA: Diagnosis not present

## 2016-07-02 LAB — SEDIMENTATION RATE: SED RATE: 4 mm/h (ref 0–15)

## 2016-07-02 LAB — RHEUMATOID FACTOR: Rhuematoid fact SerPl-aCnc: <14 IU/mL (ref <14)

## 2016-07-02 MED ORDER — AMPHETAMINE-DEXTROAMPHETAMINE 10 MG PO TABS: 10.0000 mg | ORAL_TABLET | Freq: Two times a day (BID) | ORAL | 0 refills | Status: DC

## 2016-07-02 MED ORDER — HYDROCODONE-ACETAMINOPHEN 10-325 MG PO TABS: ORAL_TABLET | ORAL | 0 refills | Status: DC

## 2016-07-02 MED ORDER — PREDNISONE 10 MG PO TABS: ORAL_TABLET | ORAL | 1 refills | Status: DC

## 2016-07-02 NOTE — Assessment & Plan Note (Signed)
Norco prn  Potential benefits of a long term opioids use as well as potential risks (i.e. addiction risk, apnea etc) and complications (i.e. Somnolence, constipation and others) were explained to the patient and were aknowledged. 

## 2016-07-02 NOTE — Progress Notes (Signed)
Pre visit review using our clinic review tool, if applicable. No additional management support is needed unless otherwise documented below in the visit note. 

## 2016-07-02 NOTE — Assessment & Plan Note (Addendum)
9/17 ?etiology - severe Labs Prednisone po  Potential benefits of a short term steroid  use as well as potential risks  and complications were explained to the patient and were aknowledged. May need to d/c Norvasc

## 2016-07-02 NOTE — Progress Notes (Signed)
Subjective:  Patient ID: Mark Mcgee, male    DOB: [birth year]  Age: 50 y.o. MRN: [id number]  CC: No chief complaint on file.   HPI Mark Mcgee A Teale presents for severe pain in the joints, swelling x 1 mo. Pain has started in the pelvis, knees - then fingers, hands.Mark Mcgee.Mark Mcgee.There is morning stiffness present as well. Mark Mcgee has been seen and treated for this several times lately - not better...  Outpatient Medications Prior to Visit  Medication Sig Dispense Refill  . acyclovir (ZOVIRAX) 400 MG tablet TAKE 1 TABLET BY MOUTH FOUR TIMES DAILY 30 tablet 3  . alprazolam (XANAX) 2 MG tablet TAKE 1 TABLET BY MOUTH THREE TIMES DAILY 90 tablet 0  . amLODipine-olmesartan (AZOR) 10-40 MG tablet Take 1 tablet by mouth daily. [employer] ID [id number]  Group number [id number] [address]  [place] Mark Mcgee [id number] 90 tablet 3  . amphetamine-dextroamphetamine (ADDERALL) 10 MG tablet Take 1 tablet (10 mg total) by mouth 2 (two) times daily with breakfast and lunch. 60 tablet 0  . b complex vitamins tablet Take 1 tablet by mouth daily.      Mark Mcgee. buPROPion (WELLBUTRIN SR) 150 MG 12 hr tablet TAKE 1 TABLET BY MOUTH TWICE DAILY 180 tablet 5  . Cholecalciferol 1000 UNITS tablet Take 2,000 Units by mouth daily.      Mark Mcgee. CIALIS 5 MG tablet Take 1 tablet (5 mg total) by mouth daily. 90 tablet 3  . diphenoxylate-atropine (LOMOTIL) 2.5-0.025 MG tablet TAKE 1 OR 2 TABLETS BY MOUTH 4 TIMES DAILY AS NEEDED FOR DIARRHEA OR LOOSE STOOLS MAX OF 8 TABLETS PER DAY 60 tablet 3  . Flaxseed, Linseed, (FLAX SEEDS) POWD Take by mouth 2 (two) times daily.      Mark Mcgee. glycopyrrolate (ROBINUL) 2 MG tablet Take 1 tablet (2 mg total) by mouth 2 (two) times daily. 180 tablet 1  . HYDROcodone-acetaminophen (NORCO) 10-325 MG tablet TAKE 1 TABLET BY MOUTH EVERY 6 HOURS AS NEEDED FOR PAIN 120 tablet 0  . hydrOXYzine (ATARAX/VISTARIL) 50 MG tablet TAKE 1-2 TABLETS BY MOUTH TWICE DAILY AS NEEDED FOR ITCHING OR FOR NAUSEA 30 tablet 1  . hyoscyamine (LEVSIN,  ANASPAZ) 0.125 MG tablet TAKE 1 TO 2 TABLETS BY MOUTH EVERY 4 HOURS AS NEEDED 180 tablet 1  . ibuprofen (ADVIL,MOTRIN) 400 MG tablet Take 1 tablet (400 mg total) by mouth 4 (four) times daily as needed. 60 tablet 5  . ipratropium (ATROVENT) 0.03 % nasal spray Place 2 sprays into both nostrils 2 (two) times daily. Do not use for more than 5days. 30 mL 12  . Multiple Vitamin (MULTIVITAMIN) capsule Take by mouth.    . testosterone cypionate (DEPOTESTOSTERONE CYPIONATE) 200 MG/ML injection Inject 1 mL (200 mg total) into the muscle every 14 (fourteen) days. 10 mL 5  . TRUVADA 200-300 MG tablet     . VIBERZI 100 MG TABS TAKE 1 TABLET BY MOUTH TWICE DAILY. 60 tablet 5  . Vitamin D, Ergocalciferol, (DRISDOL) 50000 UNITS CAPS capsule TAKE 1 CAPSULE BY MOUTH ONCE A WEEK. 8 capsule 0  . tadalafil (CIALIS) 5 MG tablet Take 1 tablet (5 mg total) by mouth daily. 90 tablet 3   No facility-administered medications prior to visit.     ROS Review of Systems  Constitutional: Positive for fatigue. Negative for appetite change and unexpected weight change.  HENT: Negative for congestion, nosebleeds, sneezing, sore throat and trouble swallowing.   Eyes: Negative for itching and visual disturbance.  Respiratory: Negative for cough.  Cardiovascular: Negative for chest pain, palpitations and leg swelling.  Gastrointestinal: Positive for diarrhea. Negative for abdominal distention, blood in stool and nausea.  Genitourinary: Negative for frequency and hematuria.  Musculoskeletal: Positive for arthralgias, back pain and neck stiffness. Negative for gait problem, joint swelling and neck pain.  Skin: Negative for color change and rash.  Neurological: Negative for dizziness, tremors, speech difficulty and weakness.  Psychiatric/Behavioral: Negative for agitation, dysphoric mood and sleep disturbance. The patient is not nervous/anxious.     Objective:  BP 110/64   Pulse 92   Temp 99 F (37.2 C) (Oral)   Wt 201 lb  (91.2 kg)   SpO2 97%   BMI 31.48 kg/m   BP Readings from Last 3 Encounters:  07/02/16 110/64  06/18/16 128/82  06/11/16 124/82    Wt Readings from Last 3 Encounters:  07/02/16 201 lb (91.2 kg)  06/18/16 200 lb (90.7 kg)  06/11/16 199 lb 12 oz (90.6 kg)    Physical Exam  Constitutional: He is oriented to person, place, and time. He appears well-developed. No distress.  NAD  HENT:  Mouth/Throat: Oropharynx is clear and moist.  Eyes: Conjunctivae are normal. Pupils are equal, round, and reactive to light.  Neck: Normal range of motion. No JVD present. No thyromegaly present.  Cardiovascular: Normal rate, regular rhythm, normal heart sounds and intact distal pulses.  Exam reveals no gallop and no friction rub.   No murmur heard. Pulmonary/Chest: Effort normal and breath sounds normal. No respiratory distress. He has no wheezes. He has no rales. He exhibits no tenderness.  Abdominal: Soft. Bowel sounds are normal. He exhibits no distension and no mass. There is no tenderness. There is no rebound and no guarding.  Musculoskeletal: Normal range of motion. He exhibits no edema or tenderness.  Lymphadenopathy:    He has no cervical adenopathy.  Neurological: He is alert and oriented to person, place, and time. He has normal reflexes. No cranial nerve deficit. He exhibits normal muscle tone. He displays a negative Romberg sign. Coordination and gait normal.  Skin: Skin is warm and dry. No rash noted.  Psychiatric: He has a normal mood and affect. His behavior is normal. Judgment and thought content normal.    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 EXAM: CHEST  2 VIEW COMPARISON:  08/05/2008. FINDINGS: The heart size and mediastinal contours are within normal limits. Both lungs are clear. The visualized skeletal structures are unremarkable. IMPRESSION: No active cardiopulmonary disease. Electronically Signed   By: Maisie Fus  Register   On: 06/06/2015 17:06    Assessment & Plan:   There are no diagnoses linked to this encounter. I am having Mr. Heard maintain his b complex vitamins, Flax Seeds, Cholecalciferol, hyoscyamine, Vitamin D (Ergocalciferol), tadalafil, ibuprofen, buPROPion, hydrOXYzine, glycopyrrolate, acyclovir, VIBERZI, diphenoxylate-atropine, CIALIS,  amLODipine-olmesartan, testosterone cypionate, alprazolam, amphetamine-dextroamphetamine, HYDROcodone-acetaminophen, multivitamin, TRUVADA, and ipratropium.  No orders of the defined types were placed in this encounter.    Follow-up: No Follow-up on file.  Sonda Primes, MD

## 2016-07-03 ENCOUNTER — Encounter: Payer: Self-pay | Admitting: Internal Medicine

## 2016-07-03 NOTE — Assessment & Plan Note (Signed)
IBS-D Viberzi

## 2016-07-05 LAB — ANA: Anti Nuclear Antibody(ANA): NEGATIVE

## 2016-08-04 ENCOUNTER — Telehealth: Payer: Self-pay | Admitting: *Deleted

## 2016-08-04 MED ORDER — TESTOSTERONE CYPIONATE 200 MG/ML IM SOLN: 200.0000 mg | INTRAMUSCULAR | 5 refills | Status: DC

## 2016-08-04 NOTE — Telephone Encounter (Signed)
Rec'd fax pt requesting refill on Testosterone Cypionate. Last filled 04/30/16...[name]/lmb

## 2016-08-04 NOTE — Telephone Encounter (Signed)
[facility]...[name]/lmb

## 2016-08-04 NOTE — Telephone Encounter (Signed)
OK to fill this prescription with additional refills x5 Thank you!  

## 2016-08-10 ENCOUNTER — Telehealth: Payer: Self-pay | Admitting: *Deleted

## 2016-08-10 NOTE — Telephone Encounter (Signed)
Rec'd fax pt requesting refill on alprazolam 2 mg. Last filled 06/17/16...[name]/lmb

## 2016-08-10 NOTE — Telephone Encounter (Signed)
OK to fill this prescription with additional refills x3 Thank you!  

## 2016-08-11 MED ORDER — ALPRAZOLAM 2 MG PO TABS: 2.0000 mg | ORAL_TABLET | Freq: Three times a day (TID) | ORAL | 3 refills | Status: DC

## 2016-08-11 NOTE — Telephone Encounter (Signed)
Refill called into [facility] had to leave on pharmacy vm.../lmb 

## 2016-08-27 ENCOUNTER — Other Ambulatory Visit: Payer: Self-pay | Admitting: Internal Medicine

## 2016-09-03 ENCOUNTER — Encounter: Payer: Self-pay | Admitting: Internal Medicine

## 2016-09-03 ENCOUNTER — Ambulatory Visit (INDEPENDENT_AMBULATORY_CARE_PROVIDER_SITE_OTHER): Payer: 59 | Admitting: Internal Medicine

## 2016-09-03 DIAGNOSIS — G8929 Other chronic pain: Secondary | ICD-10-CM

## 2016-09-03 DIAGNOSIS — M5440 Lumbago with sciatica, unspecified side: Secondary | ICD-10-CM | POA: Diagnosis not present

## 2016-09-03 MED ORDER — AMPHETAMINE-DEXTROAMPHETAMINE 10 MG PO TABS: 10.0000 mg | ORAL_TABLET | Freq: Two times a day (BID) | ORAL | 0 refills | Status: DC

## 2016-09-03 MED ORDER — HYDROCODONE-ACETAMINOPHEN 10-325 MG PO TABS: ORAL_TABLET | ORAL | 0 refills | Status: DC

## 2016-09-03 NOTE — Assessment & Plan Note (Signed)
Hot yoga Body pillow GERD wedge McKenzie pillow Rice bag TRE (trauma release exercise) 

## 2016-09-03 NOTE — Patient Instructions (Signed)
Hot yoga Body pillow GERD wedge McKenzie pillow Rice bag TRE (trauma release exercise) 

## 2016-09-03 NOTE — Progress Notes (Signed)
Subjective:  Patient ID: Mark Mcgee, male    DOB: 1965-10-23  Age: 50 y.o. MRN: [id number]  CC: No chief complaint on file.   HPI Mark Mcgee presents for LBP, depression, IBS-D, insomnia f/u. Viberzi is helpin  Outpatient Medications Prior to Visit  Medication Sig Dispense Refill  . acyclovir (ZOVIRAX) 400 MG tablet TAKE 1 TABLET BY MOUTH FOUR TIMES DAILY 30 tablet 3  . alprazolam (XANAX) 2 MG tablet Take 1 tablet (2 mg total) by mouth 3 (three) times daily. 90 tablet 3  . amLODipine-olmesartan (AZOR) 10-40 MG tablet Take 1 tablet by mouth daily. [employer] ID [id number]  Group number [id number] [address]  [place] [phone] 90 tablet 3  . amphetamine-dextroamphetamine (ADDERALL) 10 MG tablet Take 1 tablet (10 mg total) by mouth 2 (two) times daily with breakfast and lunch. 60 tablet 0  . b complex vitamins tablet Take 1 tablet by mouth daily.      [name]. buPROPion (WELLBUTRIN SR) 150 MG 12 hr tablet TAKE 1 TABLET BY MOUTH TWICE DAILY 180 tablet 5  . Cholecalciferol 1000 UNITS tablet Take 2,000 Units by mouth daily.      [name]. CIALIS 5 MG tablet Take 1 tablet (5 mg total) by mouth daily. 90 tablet 3  . diphenoxylate-atropine (LOMOTIL) 2.5-0.025 MG tablet TAKE 1 OR 2 TABLETS BY MOUTH 4 TIMES DAILY AS NEEDED FOR DIARRHEA OR LOOSE STOOLS MAX OF 8 TABLETS PER DAY 60 tablet 3  . Flaxseed, Linseed, (FLAX SEEDS) POWD Take by mouth 2 (two) times daily.      [name]. glycopyrrolate (ROBINUL) 2 MG tablet TAKE 1 TABLET BY MOUTH TWICE DAILY 180 tablet 0  . HYDROcodone-acetaminophen (NORCO) 10-325 MG tablet TAKE 1 TABLET BY MOUTH EVERY 6 HOURS AS NEEDED FOR PAIN 120 tablet 0  . hydrOXYzine (ATARAX/VISTARIL) 50 MG tablet TAKE 1-2 TABLETS BY MOUTH TWICE DAILY AS NEEDED FOR ITCHING OR FOR NAUSEA 30 tablet 1  . hyoscyamine (LEVSIN, ANASPAZ) 0.125 MG tablet TAKE 1 TO 2 TABLETS BY MOUTH EVERY 4 HOURS AS NEEDED 180 tablet 1  . ibuprofen (ADVIL,MOTRIN) 400 MG tablet Take 1 tablet (400 mg total) by mouth 4  (four) times daily as needed. 60 tablet 5  . ipratropium (ATROVENT) 0.03 % nasal spray Place 2 sprays into both nostrils 2 (two) times daily. Do not use for more than 5days. 30 mL 12  . Multiple Vitamin (MULTIVITAMIN) capsule Take by mouth.    . predniSONE (DELTASONE) 10 MG tablet Prednisone 10 mg: take 4 tabs a day x 3 days; then 3 tabs a day x 4 days; then 2 tabs a day x 4 days, then 1 tab a day Take pc. 60 tablet 1  . testosterone cypionate (DEPOTESTOSTERONE CYPIONATE) 200 MG/ML injection Inject 1 mL (200 mg total) into the muscle every 14 (fourteen) days. 10 mL 5  . TRUVADA 200-300 MG tablet     . VIBERZI 100 MG TABS TAKE 1 TABLET BY MOUTH TWICE DAILY. 60 tablet 5  . Vitamin D, Ergocalciferol, (DRISDOL) 50000 UNITS CAPS capsule TAKE 1 CAPSULE BY MOUTH ONCE A WEEK. 8 capsule 0  . tadalafil (CIALIS) 5 MG tablet Take 1 tablet (5 mg total) by mouth daily. 90 tablet 3   No facility-administered medications prior to visit.     ROS Review of Systems  Constitutional: Negative for appetite change, fatigue and unexpected weight change.  HENT: Negative for congestion, nosebleeds, sneezing, sore throat and trouble swallowing.   Eyes: Negative for itching  and visual disturbance.  Respiratory: Positive for cough.   Cardiovascular: Negative for chest pain, palpitations and leg swelling.  Gastrointestinal: Positive for abdominal pain and diarrhea. Negative for abdominal distention, blood in stool and nausea.  Genitourinary: Negative for frequency and hematuria.  Musculoskeletal: Positive for back pain. Negative for gait problem, joint swelling and neck pain.  Skin: Negative for rash.  Neurological: Negative for dizziness, tremors, speech difficulty and weakness.  Psychiatric/Behavioral: Positive for sleep disturbance. Negative for agitation, dysphoric mood and suicidal ideas. The patient is nervous/anxious.     Objective:  BP (!) 110/58   Pulse 91   Wt 191 lb (86.6 kg)   SpO2 96%   BMI 29.91  kg/m   BP Readings from Last 3 Encounters:  09/03/16 (!) 110/58  07/02/16 110/64  06/18/16 128/82    Wt Readings from Last 3 Encounters:  09/03/16 191 lb (86.6 kg)  07/02/16 201 lb (91.2 kg)  06/18/16 200 lb (90.7 kg)    Physical Exam  Constitutional: He is oriented to person, place, and time. He appears well-developed. No distress.  NAD  HENT:  Mouth/Throat: Oropharynx is clear and moist.  Eyes: Conjunctivae are normal. Pupils are equal, round, and reactive to light.  Neck: Normal range of motion. No JVD present. No thyromegaly present.  Cardiovascular: Normal rate, regular rhythm, normal heart sounds and intact distal pulses.  Exam reveals no gallop and no friction rub.   No murmur heard. Pulmonary/Chest: Effort normal and breath sounds normal. No respiratory distress. He has no wheezes. He has no rales. He exhibits no tenderness.  Abdominal: Soft. Bowel sounds are normal. He exhibits no distension and no mass. There is no tenderness. There is no rebound and no guarding.  Musculoskeletal: Normal range of motion. He exhibits no edema or tenderness.  Lymphadenopathy:    He has no cervical adenopathy.  Neurological: He is alert and oriented to person, place, and time. He has normal reflexes. No cranial nerve deficit. He exhibits normal muscle tone. He displays a negative Romberg sign. Coordination and gait normal.  Skin: Skin is warm and dry. No rash noted.  Psychiatric: He has a normal mood and affect. His behavior is normal. Judgment and thought content normal.    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 EXAM: CHEST  2 VIEW COMPARISON:  08/05/2008. FINDINGS: The heart size and mediastinal contours are within normal limits. Both lungs are clear. The visualized skeletal structures are unremarkable. IMPRESSION: No active cardiopulmonary disease. Electronically Signed   By: Maisie Fus  Register   On: 06/06/2015 17:06    Assessment & Plan:   There are no diagnoses linked to this encounter. I am having Mr. Capozzi maintain his b complex vitamins, Flax Seeds, Cholecalciferol, hyoscyamine, Vitamin D (Ergocalciferol), tadalafil, ibuprofen, buPROPion, hydrOXYzine, acyclovir, VIBERZI, diphenoxylate-atropine, CIALIS,  amLODipine-olmesartan, multivitamin, TRUVADA, ipratropium, amphetamine-dextroamphetamine, HYDROcodone-acetaminophen, predniSONE, testosterone cypionate, alprazolam, and glycopyrrolate.  No orders of the defined types were placed in this encounter.    Follow-up: No Follow-up on file.  Sonda PrimesAlex Isaias Dowson, MD

## 2016-09-03 NOTE — Progress Notes (Signed)
Pre visit review using our clinic review tool, if applicable. No additional management support is needed unless otherwise documented below in the visit note. 

## 2016-12-03 ENCOUNTER — Ambulatory Visit: Payer: 59 | Admitting: Internal Medicine

## 2017-01-20 ENCOUNTER — Other Ambulatory Visit: Payer: Self-pay

## 2017-01-20 NOTE — Telephone Encounter (Signed)
Last filled 12/26/15. Okay to refill?

## 2017-01-25 ENCOUNTER — Encounter: Payer: Self-pay | Admitting: Internal Medicine

## 2017-01-25 ENCOUNTER — Other Ambulatory Visit: Payer: Self-pay | Admitting: Internal Medicine

## 2017-01-25 MED ORDER — ACYCLOVIR 400 MG PO TABS: 400.0000 mg | ORAL_TABLET | Freq: Four times a day (QID) | ORAL | 3 refills | Status: DC

## 2017-01-26 ENCOUNTER — Other Ambulatory Visit: Payer: Self-pay | Admitting: *Deleted

## 2017-01-26 MED ORDER — CIALIS 5 MG PO TABS: 5.0000 mg | ORAL_TABLET | Freq: Every day | ORAL | 2 refills | Status: DC

## 2017-01-27 ENCOUNTER — Telehealth: Payer: Self-pay | Admitting: *Deleted

## 2017-01-27 DIAGNOSIS — H5213 Myopia, bilateral: Secondary | ICD-10-CM | POA: Diagnosis not present

## 2017-01-27 NOTE — Telephone Encounter (Signed)
Pt sent mychart msg stating pharmacy inform him he need PA for the Cialis (see msg below)  The pharmacy said that my Cialis 5mg  one PO qd requires prior auth. Would you please obtain this. I tried [facility] said the MD office had to send it in. I use it for BPH and poor urine flow.   Thank you.

## 2017-01-28 NOTE — Telephone Encounter (Signed)
Completed PA n cover-my-meds. Rec'd msg sending to plan will have response back between 24-72 hours...[name]/lmb

## 2017-02-02 ENCOUNTER — Encounter: Payer: Self-pay | Admitting: Internal Medicine

## 2017-02-02 ENCOUNTER — Ambulatory Visit (INDEPENDENT_AMBULATORY_CARE_PROVIDER_SITE_OTHER): Payer: 59 | Admitting: Internal Medicine

## 2017-02-02 DIAGNOSIS — K580 Irritable bowel syndrome with diarrhea: Secondary | ICD-10-CM

## 2017-02-02 DIAGNOSIS — F4323 Adjustment disorder with mixed anxiety and depressed mood: Secondary | ICD-10-CM | POA: Diagnosis not present

## 2017-02-02 DIAGNOSIS — I10 Essential (primary) hypertension: Secondary | ICD-10-CM | POA: Diagnosis not present

## 2017-02-02 DIAGNOSIS — E291 Testicular hypofunction: Secondary | ICD-10-CM

## 2017-02-02 DIAGNOSIS — R350 Frequency of micturition: Secondary | ICD-10-CM | POA: Diagnosis not present

## 2017-02-02 DIAGNOSIS — R0981 Nasal congestion: Secondary | ICD-10-CM

## 2017-02-02 DIAGNOSIS — N401 Enlarged prostate with lower urinary tract symptoms: Secondary | ICD-10-CM

## 2017-02-02 DIAGNOSIS — M5440 Lumbago with sciatica, unspecified side: Secondary | ICD-10-CM | POA: Diagnosis not present

## 2017-02-02 DIAGNOSIS — G8929 Other chronic pain: Secondary | ICD-10-CM | POA: Diagnosis not present

## 2017-02-02 MED ORDER — IPRATROPIUM BROMIDE 0.03 % NA SOLN: 2.0000 | Freq: Two times a day (BID) | NASAL | 5 refills | Status: DC

## 2017-02-02 MED ORDER — AMPHETAMINE-DEXTROAMPHETAMINE 10 MG PO TABS: 10.0000 mg | ORAL_TABLET | Freq: Two times a day (BID) | ORAL | 0 refills | Status: DC

## 2017-02-02 MED ORDER — AMLODIPINE-OLMESARTAN 10-40 MG PO TABS: 1.0000 | ORAL_TABLET | Freq: Every day | ORAL | 3 refills | Status: DC

## 2017-02-02 MED ORDER — HYOSCYAMINE SULFATE 0.125 MG PO TABS: ORAL_TABLET | ORAL | 1 refills | Status: DC

## 2017-02-02 MED ORDER — HYDROCODONE-ACETAMINOPHEN 10-325 MG PO TABS: ORAL_TABLET | ORAL | 0 refills | Status: DC

## 2017-02-02 MED ORDER — ALPRAZOLAM 2 MG PO TABS: 2.0000 mg | ORAL_TABLET | Freq: Three times a day (TID) | ORAL | 3 refills | Status: DC

## 2017-02-02 MED ORDER — GLYCOPYRROLATE 2 MG PO TABS: 2.0000 mg | ORAL_TABLET | Freq: Two times a day (BID) | ORAL | 0 refills | Status: DC

## 2017-02-02 MED ORDER — CIALIS 5 MG PO TABS: 5.0000 mg | ORAL_TABLET | Freq: Every day | ORAL | 2 refills | Status: DC

## 2017-02-02 MED ORDER — ACYCLOVIR 400 MG PO TABS: 400.0000 mg | ORAL_TABLET | Freq: Four times a day (QID) | ORAL | 3 refills | Status: DC

## 2017-02-02 MED ORDER — ELUXADOLINE 100 MG PO TABS: 1.0000 | ORAL_TABLET | Freq: Two times a day (BID) | ORAL | 2 refills | Status: DC

## 2017-02-02 MED ORDER — TESTOSTERONE CYPIONATE 200 MG/ML IM SOLN: 200.0000 mg | INTRAMUSCULAR | 5 refills | Status: DC

## 2017-02-02 MED ORDER — DIPHENOXYLATE-ATROPINE 2.5-0.025 MG PO TABS: ORAL_TABLET | ORAL | 3 refills | Status: AC

## 2017-02-02 MED ORDER — TRUVADA 200-300 MG PO TABS: 1.0000 | ORAL_TABLET | Freq: Every day | ORAL | 2 refills | Status: DC

## 2017-02-02 NOTE — Telephone Encounter (Signed)
LMTCB

## 2017-02-02 NOTE — Assessment & Plan Note (Signed)
On Testosterone 

## 2017-02-02 NOTE — Assessment & Plan Note (Signed)
Azor 

## 2017-02-02 NOTE — Progress Notes (Signed)
Subjective:  Patient ID: Mark Mcgee, male    DOB: December 11, 1965  Age: 51 y.o. MRN: [id number]  CC: No chief complaint on file.   HPI WELCOME FULTS presents for HTN, anxiety, ADD, depression f/u  Outpatient Medications Prior to Visit  Medication Sig Dispense Refill  . acyclovir (ZOVIRAX) 400 MG tablet Take 1 tablet (400 mg total) by mouth 4 (four) times daily. 30 tablet 3  . alprazolam (XANAX) 2 MG tablet Take 1 tablet (2 mg total) by mouth 3 (three) times daily. 90 tablet 3  . amLODipine-olmesartan (AZOR) 10-40 MG tablet Take 1 tablet by mouth daily. [employer] ID [id number]  Group number [id number] [address]  [place] [id number] 90 tablet 3  . amphetamine-dextroamphetamine (ADDERALL) 10 MG tablet Take 1 tablet (10 mg total) by mouth 2 (two) times daily with breakfast and lunch. 60 tablet 0  . amphetamine-dextroamphetamine (ADDERALL) 10 MG tablet Take 1 tablet (10 mg total) by mouth 2 (two) times daily with breakfast and lunch. 60 tablet 0  . b complex vitamins tablet Take 1 tablet by mouth daily.      [name] buPROPion (WELLBUTRIN SR) 150 MG 12 hr tablet TAKE 1 TABLET BY MOUTH TWICE DAILY 180 tablet 5  . Cholecalciferol 1000 UNITS tablet Take 2,000 Units by mouth daily.      [name] CIALIS 5 MG tablet Take 1 tablet (5 mg total) by mouth daily. 30 tablet 2  . diphenoxylate-atropine (LOMOTIL) 2.5-0.025 MG tablet TAKE 1 OR 2 TABLETS BY MOUTH 4 TIMES DAILY AS NEEDED FOR DIARRHEA OR LOOSE STOOLS MAX OF 8 TABLETS PER DAY 60 tablet 3  . Flaxseed, Linseed, (FLAX SEEDS) POWD Take by mouth 2 (two) times daily.      [name] glycopyrrolate (ROBINUL) 2 MG tablet TAKE 1 TABLET BY MOUTH TWICE DAILY 180 tablet 0  . HYDROcodone-acetaminophen (NORCO) 10-325 MG tablet TAKE 1 TABLET BY MOUTH EVERY 6 HOURS AS NEEDED FOR PAIN 120 tablet 0  . HYDROcodone-acetaminophen (NORCO) 10-325 MG tablet TAKE 1 TABLET BY MOUTH EVERY 6 HOURS AS NEEDED FOR PAIN 120 tablet 0  . hydrOXYzine (ATARAX/VISTARIL) 50 MG tablet TAKE 1-2  TABLETS BY MOUTH TWICE DAILY AS NEEDED FOR ITCHING OR FOR NAUSEA 30 tablet 1  . hyoscyamine (LEVSIN, ANASPAZ) 0.125 MG tablet TAKE 1 TO 2 TABLETS BY MOUTH EVERY 4 HOURS AS NEEDED 180 tablet 1  . ibuprofen (ADVIL,MOTRIN) 400 MG tablet Take 1 tablet (400 mg total) by mouth 4 (four) times daily as needed. 60 tablet 5  . ipratropium (ATROVENT) 0.03 % nasal spray Place 2 sprays into both nostrils 2 (two) times daily. Do not use for more than 5days. 30 mL 12  . Multiple Vitamin (MULTIVITAMIN) capsule Take by mouth.    . predniSONE (DELTASONE) 10 MG tablet Prednisone 10 mg: take 4 tabs a day x 3 days; then 3 tabs a day x 4 days; then 2 tabs a day x 4 days, then 1 tab a day Take pc. 60 tablet 1  . testosterone cypionate (DEPOTESTOSTERONE CYPIONATE) 200 MG/ML injection Inject 1 mL (200 mg total) into the muscle every 14 (fourteen) days. 10 mL 5  . TRUVADA 200-300 MG tablet     . VIBERZI 100 MG TABS TAKE 1 TABLET BY MOUTH TWICE DAILY 60 tablet 5  . Vitamin D, Ergocalciferol, (DRISDOL) 50000 UNITS CAPS capsule TAKE 1 CAPSULE BY MOUTH ONCE A WEEK. 8 capsule 0  . tadalafil (CIALIS) 5 MG tablet Take 1 tablet (5 mg total) by mouth  daily. 90 tablet 3   No facility-administered medications prior to visit.     ROS Review of Systems  Constitutional: Negative for appetite change, fatigue and unexpected weight change.  HENT: Negative for congestion, nosebleeds, sneezing, sore throat and trouble swallowing.   Eyes: Negative for itching and visual disturbance.  Respiratory: Negative for cough.   Cardiovascular: Negative for chest pain, palpitations and leg swelling.  Gastrointestinal: Positive for abdominal pain and diarrhea. Negative for abdominal distention, blood in stool and nausea.  Genitourinary: Negative for frequency and hematuria.  Musculoskeletal: Positive for arthralgias and neck stiffness. Negative for back pain, gait problem, joint swelling and neck pain.  Skin: Negative for rash.  Neurological:  Negative for dizziness, tremors, speech difficulty and weakness.  Psychiatric/Behavioral: Negative for agitation, dysphoric mood and sleep disturbance. The patient is nervous/anxious.     Objective:  BP (!) 110/54 (BP Location: Left Arm, Patient Position: Sitting, Cuff Size: Normal)   Pulse 81   Temp 98.3 F (36.8 C) (Oral)   Ht 5\' 7"  (1.702 m)   Wt 196 lb (88.9 kg)   SpO2 98%   BMI 30.70 kg/m   BP Readings from Last 3 Encounters:  02/02/17 (!) 110/54  09/03/16 (!) 110/58  07/02/16 110/64    Wt Readings from Last 3 Encounters:  02/02/17 196 lb (88.9 kg)  09/03/16 191 lb (86.6 kg)  07/02/16 201 lb (91.2 kg)    Physical Exam  Constitutional: He is oriented to person, place, and time. He appears well-developed. No distress.  NAD  HENT:  Mouth/Throat: Oropharynx is clear and moist.  Eyes: Conjunctivae are normal. Pupils are equal, round, and reactive to light.  Neck: Normal range of motion. No JVD present. No thyromegaly present.  Cardiovascular: Normal rate, regular rhythm, normal heart sounds and intact distal pulses.  Exam reveals no gallop and no friction rub.   No murmur heard. Pulmonary/Chest: Effort normal and breath sounds normal. No respiratory distress. He has no wheezes. He has no rales. He exhibits no tenderness.  Abdominal: Soft. Bowel sounds are normal. He exhibits no distension and no mass. There is no tenderness. There is no rebound and no guarding.  Musculoskeletal: Normal range of motion. He exhibits tenderness. He exhibits no edema.  Lymphadenopathy:    He has no cervical adenopathy.  Neurological: He is alert and oriented to person, place, and time. He has normal reflexes. No cranial nerve deficit. He exhibits normal muscle tone. He displays a negative Romberg sign. Coordination and gait normal.  Skin: Skin is warm and dry. No rash noted.  Psychiatric: He has a normal mood and affect. His behavior is normal. Judgment and thought content normal.    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 EXAM: CHEST  2 VIEW COMPARISON:  08/05/2008. FINDINGS: The heart size  and mediastinal contours are within normal limits. Both lungs are clear. The visualized skeletal structures are unremarkable. IMPRESSION: No active cardiopulmonary disease. Electronically Signed   By: Maisie Fushomas  Register   On: 06/06/2015 17:06    Assessment & Plan:   There are no diagnoses linked to this encounter. I am having Mr. Idamae LusherGiordano maintain his b complex vitamins, Flax Seeds, Cholecalciferol, hyoscyamine, Vitamin D (Ergocalciferol), tadalafil, ibuprofen, buPROPion, hydrOXYzine, diphenoxylate-atropine, amLODipine-olmesartan, multivitamin, TRUVADA, ipratropium, predniSONE, testosterone cypionate, alprazolam, glycopyrrolate, amphetamine-dextroamphetamine, HYDROcodone-acetaminophen, amphetamine-dextroamphetamine, HYDROcodone-acetaminophen, acyclovir, VIBERZI, and CIALIS.  No orders of the defined types were placed in this encounter.    Follow-up: No Follow-up on file.  Sonda PrimesAlex Aarna Mihalko, MD

## 2017-02-02 NOTE — Assessment & Plan Note (Signed)
Cialis prn 

## 2017-02-02 NOTE — Telephone Encounter (Signed)
Pt ID [id number]   Please call back for PA for clinical information.  Please call back.

## 2017-02-02 NOTE — Assessment & Plan Note (Signed)
Xanax prn Off Welbutrin

## 2017-02-02 NOTE — Assessment & Plan Note (Signed)
Norco prn  Potential benefits of a long term opioids use as well as potential risks (i.e. addiction risk, apnea etc) and complications (i.e. Somnolence, constipation and others) were explained to the patient and were aknowledged. 

## 2017-02-02 NOTE — Assessment & Plan Note (Signed)
Cont w/Viberzi - 100 mg bid - doing well Rubinol, Hyoscyamine, Lomotil prn

## 2017-02-03 NOTE — Telephone Encounter (Signed)
Done

## 2017-02-17 DIAGNOSIS — Z202 Contact with and (suspected) exposure to infections with a predominantly sexual mode of transmission: Secondary | ICD-10-CM | POA: Diagnosis not present

## 2017-02-17 DIAGNOSIS — Z09 Encounter for follow-up examination after completed treatment for conditions other than malignant neoplasm: Secondary | ICD-10-CM | POA: Diagnosis not present

## 2017-02-17 DIAGNOSIS — Z6829 Body mass index (BMI) 29.0-29.9, adult: Secondary | ICD-10-CM | POA: Diagnosis not present

## 2017-02-17 DIAGNOSIS — N410 Acute prostatitis: Secondary | ICD-10-CM | POA: Diagnosis not present

## 2017-02-17 DIAGNOSIS — Z206 Contact with and (suspected) exposure to human immunodeficiency virus [HIV]: Secondary | ICD-10-CM | POA: Diagnosis not present

## 2017-02-17 MED FILL — DIPHENOXYLATE/ATROPINE TAB: 2.5-0.025 | 8 days supply | Qty: 60 | Fill #0

## 2017-02-17 MED FILL — TRUVADA 200-300 MG TABS: 200-300 | 30 days supply | Qty: 30 | Fill #0

## 2017-02-17 MED FILL — ACYCLOVIR 400 MG TABLET: 400 | 7 days supply | Qty: 30 | Fill #0

## 2017-02-17 MED FILL — DOXYCYCLINE HYC 100 MG TAB: 100 | 15 days supply | Qty: 30 | Fill #0

## 2017-02-17 MED FILL — ALPRAZolam 2 MG TABS: 2 | 30 days supply | Qty: 90 | Fill #0

## 2017-02-17 MED FILL — IPRATROPIUM 0.03% SPRAY: 0.03 | 30 days supply | Qty: 30 | Fill #0

## 2017-03-02 MED FILL — DOXYCYCLINE HYC 100 MG TAB: 100 | 15 days supply | Qty: 30 | Fill #1

## 2017-03-03 ENCOUNTER — Telehealth: Payer: Self-pay | Admitting: Pharmacist

## 2017-03-03 MED FILL — HYDROCODON-APAP 10-325: 10-325 | 30 days supply | Qty: 120 | Fill #0

## 2017-03-03 MED FILL — AMLODIPINE-OLMESARTAN 10-40: 10-40 | 90 days supply | Qty: 90 | Fill #0

## 2017-03-03 MED FILL — DEPO-TESTOSTERONE 200 MG/ML: 200 | 84 days supply | Qty: 6 | Fill #0

## 2017-03-03 MED FILL — DEXTROAMP-AMP 10 MG TAB: 10 | 30 days supply | Qty: 60 | Fill #0

## 2017-03-03 NOTE — Telephone Encounter (Signed)
Excellent thanks so much Cassie!

## 2017-03-03 NOTE — Telephone Encounter (Signed)
Renae Fickleaul is on PrEP and [facility]  He is not getting his PrEP services from us or a [facility] physician.  I called him to ask how he is doing and introduce myself since [facility]. He is currently getting his PrEP services from Dr. Hart RochesterLawson in [place] (unsure what system he is with, we cannot see in epic or Care Everywhere).  Offered to have him come here for his PrEP since he is a [employer], [employer] employee, getting his medications through us, and may be closer.  He said he would like to do that and after his next appt with Dr. Hart RochesterLawson, he will call me back and make an initial appt with me.

## 2017-03-15 ENCOUNTER — Encounter: Payer: Self-pay | Admitting: Nurse Practitioner

## 2017-03-15 ENCOUNTER — Ambulatory Visit (INDEPENDENT_AMBULATORY_CARE_PROVIDER_SITE_OTHER): Payer: 59 | Admitting: Nurse Practitioner

## 2017-03-15 ENCOUNTER — Other Ambulatory Visit (INDEPENDENT_AMBULATORY_CARE_PROVIDER_SITE_OTHER): Payer: 59

## 2017-03-15 VITALS — BP 112/62 | HR 102 | Temp 98.1°F | Ht 67.0 in | Wt 201.0 lb

## 2017-03-15 DIAGNOSIS — R112 Nausea with vomiting, unspecified: Secondary | ICD-10-CM

## 2017-03-15 DIAGNOSIS — R0789 Other chest pain: Secondary | ICD-10-CM

## 2017-03-15 LAB — CBC WITH DIFFERENTIAL/PLATELET
BASOS ABS: 0.1 10*3/uL (ref 0.0–0.1)
Basophils Relative: 1.2 % (ref 0.0–3.0)
EOS ABS: 0.2 10*3/uL (ref 0.0–0.7)
Eosinophils Relative: 4 % (ref 0.0–5.0)
HEMATOCRIT: 40.3 % (ref 39.0–52.0)
HEMOGLOBIN: 12.8 g/dL — AB (ref 13.0–17.0)
LYMPHS PCT: 39.1 % (ref 12.0–46.0)
Lymphs Abs: 1.9 10*3/uL (ref 0.7–4.0)
MCHC: 31.8 g/dL (ref 30.0–36.0)
MCV: 77.3 fl — ABNORMAL LOW (ref 78.0–100.0)
Monocytes Absolute: 0.6 10*3/uL (ref 0.1–1.0)
Monocytes Relative: 11.9 % (ref 3.0–12.0)
NEUTROS ABS: 2.1 10*3/uL (ref 1.4–7.7)
Neutrophils Relative %: 43.8 % (ref 43.0–77.0)
PLATELETS: 251 10*3/uL (ref 150.0–400.0)
RBC: 5.22 Mil/uL (ref 4.22–5.81)
RDW: 15.3 % (ref 11.5–15.5)
WBC: 4.7 10*3/uL (ref 4.0–10.5)

## 2017-03-15 LAB — URINALYSIS, ROUTINE W REFLEX MICROSCOPIC
BILIRUBIN URINE: NEGATIVE
HGB URINE DIPSTICK: NEGATIVE
KETONES UR: NEGATIVE
LEUKOCYTES UA: NEGATIVE
NITRITE: NEGATIVE
RBC / HPF: NONE SEEN (ref 0–?)
Specific Gravity, Urine: 1.01 (ref 1.000–1.030)
Total Protein, Urine: NEGATIVE
UROBILINOGEN UA: 0.2 (ref 0.0–1.0)
Urine Glucose: NEGATIVE
pH: 5.5 (ref 5.0–8.0)

## 2017-03-15 LAB — AMYLASE: AMYLASE: 33 U/L (ref 27–131)

## 2017-03-15 LAB — COMPREHENSIVE METABOLIC PANEL
ALT: 18 U/L (ref 0–53)
AST: 15 U/L (ref 0–37)
Albumin: 4.5 g/dL (ref 3.5–5.2)
Alkaline Phosphatase: 51 U/L (ref 39–117)
BUN: 22 mg/dL (ref 6–23)
CALCIUM: 9.8 mg/dL (ref 8.4–10.5)
CO2: 28 meq/L (ref 19–32)
CREATININE: 1.28 mg/dL (ref 0.40–1.50)
Chloride: 106 mEq/L (ref 96–112)
GFR: 63.24 mL/min (ref >60.00)
GLUCOSE: 103 mg/dL — AB (ref 70–99)
Potassium: 5.6 mEq/L — ABNORMAL HIGH (ref 3.5–5.1)
SODIUM: 141 meq/L (ref 135–145)
Total Bilirubin: 0.4 mg/dL (ref 0.2–1.2)
Total Protein: 6.5 g/dL (ref 6.0–8.3)

## 2017-03-15 LAB — LIPASE: Lipase: 19 U/L (ref 11.0–59.0)

## 2017-03-15 LAB — C-REACTIVE PROTEIN: CRP: 0.2 mg/dL — ABNORMAL LOW (ref 0.5–20.0)

## 2017-03-15 MED ORDER — ONDANSETRON HCL 4 MG PO TABS: 4.0000 mg | ORAL_TABLET | Freq: Three times a day (TID) | ORAL | 0 refills | Status: DC | PRN

## 2017-03-15 MED ORDER — ONDANSETRON 4 MG PO TBDP
4.0000 mg | ORAL_TABLET | Freq: Once | ORAL | Status: AC
  Administered 2017-03-15: 4 mg via ORAL

## 2017-03-15 MED FILL — TRUVADA 200-300 MG TABS: 200-300 | 30 days supply | Qty: 30 | Fill #1

## 2017-03-15 MED FILL — ACYCLOVIR 400 MG TABLET: 400 | 7 days supply | Qty: 30 | Fill #1

## 2017-03-15 MED FILL — ONDANSETRON HCL 4 MG TABLET: 4 | 10 days supply | Qty: 30 | Fill #0

## 2017-03-15 NOTE — Patient Instructions (Signed)
Maintain clear liquid diet. Encourage adequate oral hydration. Go to basement for blood draw.  Go to ED if symptoms worsen

## 2017-03-15 NOTE — Progress Notes (Signed)
Subjective:  Patient ID: Mark Mcgee, male    DOB: 12-Jan-1966  Age: 51 y.o. MRN: [id number]  CC: Pain (pain left side ribs radiate up to shoulder,neck and arms, nausea and vomit, night sweat--going on for 5 days. )   Chest Pain   This is a new problem. The current episode started in the past 7 days. The onset quality is gradual. The problem occurs constantly. The problem has been unchanged. The pain is present in the lateral region. The quality of the pain is described as dull and pressure. The pain radiates to the left shoulder and mid back. Associated symptoms include abdominal pain, back pain, nausea and vomiting. Pertinent negatives include no claudication, cough, diaphoresis, dizziness, exertional chest pressure, fever, headaches, hemoptysis, irregular heartbeat, leg pain, lower extremity edema, malaise/fatigue, near-syncope, numbness, orthopnea, palpitations, PND, shortness of breath, sputum production, syncope or weakness. Risk factors include male gender.  His past medical history is significant for hypertension and stimulant use.   Left chest wall pain: Onset 4days ago  Associated with nausea and vomiting.  Outpatient Medications Prior to Visit  Medication Sig Dispense Refill  . acyclovir (ZOVIRAX) 400 MG tablet Take 1 tablet (400 mg total) by mouth 4 (four) times daily. 30 tablet 3  . alprazolam (XANAX) 2 MG tablet Take 1 tablet (2 mg total) by mouth 3 (three) times daily. 90 tablet 3  . amLODipine-olmesartan (AZOR) 10-40 MG tablet Take 1 tablet by mouth daily. 90 tablet 3  . amphetamine-dextroamphetamine (ADDERALL) 10 MG tablet Take 1 tablet (10 mg total) by mouth 2 (two) times daily with breakfast and lunch. 60 tablet 0  . b complex vitamins tablet Take 1 tablet by mouth daily.      . Cholecalciferol 1000 UNITS tablet Take 2,000 Units by mouth daily.      [name] CIALIS 5 MG tablet Take 1 tablet (5 mg total) by mouth daily. 90 tablet 2  . diphenoxylate-atropine (LOMOTIL) 2.5-0.025  MG tablet TAKE 1 OR 2 TABLETS BY MOUTH 4 TIMES DAILY AS NEEDED FOR DIARRHEA OR LOOSE STOOLS MAX OF 8 TABLETS PER DAY 60 tablet 3  . Eluxadoline (VIBERZI) 100 MG TABS Take 1 tablet (100 mg total) by mouth 2 (two) times daily. 180 tablet 2  . Flaxseed, Linseed, (FLAX SEEDS) POWD Take by mouth 2 (two) times daily.      [name] glycopyrrolate (ROBINUL) 2 MG tablet Take 1 tablet (2 mg total) by mouth 2 (two) times daily. 180 tablet 0  . HYDROcodone-acetaminophen (NORCO) 10-325 MG tablet TAKE 1 TABLET BY MOUTH EVERY 6 HOURS AS NEEDED FOR PAIN 120 tablet 0  . hyoscyamine (LEVSIN, ANASPAZ) 0.125 MG tablet TAKE 1 TO 2 TABLETS BY MOUTH EVERY 4 HOURS AS NEEDED 180 tablet 1  . ibuprofen (ADVIL,MOTRIN) 400 MG tablet Take 1 tablet (400 mg total) by mouth 4 (four) times daily as needed. 60 tablet 5  . ipratropium (ATROVENT) 0.03 % nasal spray Place 2 sprays into both nostrils 2 (two) times daily. 30 mL 5  . Multiple Vitamin (MULTIVITAMIN) capsule Take by mouth.    . testosterone cypionate (DEPOTESTOSTERONE CYPIONATE) 200 MG/ML injection Inject 1 mL (200 mg total) into the muscle every 14 (fourteen) days. 10 mL 5  . TRUVADA 200-300 MG tablet Take 1 tablet by mouth daily. 90 tablet 2  . Vitamin D, Ergocalciferol, (DRISDOL) 50000 UNITS CAPS capsule TAKE 1 CAPSULE BY MOUTH ONCE A WEEK. 8 capsule 0   No facility-administered medications prior to visit.  ROS See HPI  Objective:  BP 112/62   Pulse (!) 102   Temp 98.1 F (36.7 C)   Ht 5\' 7"  (1.702 m)   Wt 201 lb (91.2 kg)   SpO2 99%   BMI 31.48 kg/m   BP Readings from Last 3 Encounters:  03/15/17 112/62  02/02/17 (!) 110/54  09/03/16 (!) 110/58    Wt Readings from Last 3 Encounters:  03/15/17 201 lb (91.2 kg)  02/02/17 196 lb (88.9 kg)  09/03/16 191 lb (86.6 kg)    Physical Exam  Constitutional: He is oriented to person, place, and time. No distress.  Neck: Normal range of motion. Neck supple. No tracheal deviation present.  Cardiovascular: Normal  rate and normal heart sounds.   Pulmonary/Chest: Effort normal and breath sounds normal. He exhibits tenderness.  Left anterior chest wall tenderness. No rash, no erythema  Abdominal: Soft. Bowel sounds are normal. He exhibits no distension. There is tenderness.  LUQ tenderness  Musculoskeletal: He exhibits tenderness.  Neurological: He is alert and oriented to person, place, and time.  Vitals reviewed.   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 EXAM: CHEST  2 VIEW COMPARISON:  08/05/2008. FINDINGS: The heart size and mediastinal contours are within normal limits. Both lungs are clear. The visualized skeletal structures are unremarkable. IMPRESSION: No active cardiopulmonary disease. Electronically Signed   By: Maisie Fus  Register   On: 06/06/2015 17:06   ECG: Normal sinus rhythm, no previous ECG to compare.  Assessment & Plan:   Mark Mcgee was seen today for pain.  Diagnoses and all orders for this visit:  Left-sided chest wall pain -     EKG 12-Lead -     CBC w/Diff; Future -     Comprehensive metabolic panel; Future -     Lipase; Future -     Amylase; Future -     C-reactive protein; Future -     ondansetron (ZOFRAN) 4 MG tablet; Take 1 tablet (4 mg total) by mouth every 8 (eight) hours as needed for nausea or vomiting. -     Urinalysis, Routine w reflex microscopic; Future  Non-intractable vomiting with nausea, unspecified vomiting type -      EKG 12-Lead -     ondansetron (ZOFRAN-ODT) disintegrating tablet 4 mg; Take 1 tablet (4 mg total) by mouth once. -     CBC w/Diff; Future -     Comprehensive metabolic panel; Future -     Lipase; Future -     Amylase; Future -     C-reactive protein; Future -     ondansetron (ZOFRAN) 4 MG tablet; Take 1 tablet (4 mg total) by mouth every 8 (eight) hours as needed for nausea or vomiting. -     Urinalysis, Routine w reflex microscopic; Future   I  am having Mr. Idamae LusherGiordano start on ondansetron. I am also having him maintain his b complex vitamins, Flax Seeds, Cholecalciferol, Vitamin D (Ergocalciferol), ibuprofen, multivitamin, acyclovir, alprazolam, amLODipine-olmesartan, CIALIS, diphenoxylate-atropine, glycopyrrolate, hyoscyamine, ipratropium, testosterone cypionate, TRUVADA, Eluxadoline, HYDROcodone-acetaminophen, and amphetamine-dextroamphetamine. We administered ondansetron.  Meds ordered this encounter  Medications  . ondansetron (ZOFRAN-ODT) disintegrating tablet 4 mg  . ondansetron (ZOFRAN) 4 MG tablet    Sig: Take 1 tablet (4 mg total) by mouth every 8 (eight) hours as needed for nausea or vomiting.    Dispense:  30 tablet    Refill:  0    Order Specific Question:   Supervising Provider    Answer:   Tresa GarterPLOTNIKOV, ALEKSEI V [[id number]]    Follow-up: Return if symptoms worsen or fail to improve.  Alysia [place] Horris Speros, NP

## 2017-03-16 DIAGNOSIS — E669 Obesity, unspecified: Secondary | ICD-10-CM | POA: Diagnosis not present

## 2017-03-16 DIAGNOSIS — E781 Pure hyperglyceridemia: Secondary | ICD-10-CM | POA: Diagnosis not present

## 2017-03-18 ENCOUNTER — Inpatient Hospital Stay: Admission: RE | Admit: 2017-03-18 | Payer: 59 | Source: Ambulatory Visit

## 2017-03-18 ENCOUNTER — Other Ambulatory Visit: Payer: 59

## 2017-03-29 ENCOUNTER — Ambulatory Visit (INDEPENDENT_AMBULATORY_CARE_PROVIDER_SITE_OTHER)
Admission: RE | Admit: 2017-03-29 | Discharge: 2017-03-29 | Disposition: A | Discharge status: Home / Self Care | Payer: 59 | Source: Ambulatory Visit | Attending: Nurse Practitioner | Admitting: Nurse Practitioner

## 2017-03-29 DIAGNOSIS — R0789 Other chest pain: Secondary | ICD-10-CM

## 2017-03-29 DIAGNOSIS — R112 Nausea with vomiting, unspecified: Secondary | ICD-10-CM

## 2017-03-29 DIAGNOSIS — R1111 Vomiting without nausea: Secondary | ICD-10-CM | POA: Diagnosis not present

## 2017-03-31 ENCOUNTER — Encounter: Payer: Self-pay | Admitting: Internal Medicine

## 2017-03-31 MED FILL — predniSONE 10 MG TABS: 10 | 3 days supply | Qty: 6 | Fill #0

## 2017-03-31 MED FILL — CIALIS 5 MG TABLET: 5 | 30 days supply | Qty: 30 | Fill #0

## 2017-03-31 MED FILL — AZITHROMYCIN 250 MG TAB: 250 | 5 days supply | Qty: 6 | Fill #0

## 2017-03-31 MED FILL — ACYCLOVIR 400 MG TABLET: 400 | 7 days supply | Qty: 30 | Fill #2

## 2017-04-05 ENCOUNTER — Encounter: Payer: Self-pay | Admitting: Nurse Practitioner

## 2017-04-05 MED FILL — AZITHROMYCIN 250 MG TAB: 250 | 5 days supply | Qty: 6 | Fill #1

## 2017-04-25 MED FILL — TRUVADA 200-300 MG TABS: 200-300 | 30 days supply | Qty: 30 | Fill #2

## 2017-04-25 MED FILL — CIALIS 5 MG TABLET: 5 | 30 days supply | Qty: 30 | Fill #1

## 2017-04-25 MED FILL — ACYCLOVIR 400 MG TABLET: 400 | 7 days supply | Qty: 30 | Fill #3

## 2017-04-26 MED FILL — HYDROCODON-APAP 10-325: 10-325 | 30 days supply | Qty: 120 | Fill #0

## 2017-04-26 MED FILL — DEXTROAMP-AMPHETAMIN 10 MG: 10 | 30 days supply | Qty: 60 | Fill #0

## 2017-05-06 ENCOUNTER — Ambulatory Visit: Payer: 59 | Admitting: Internal Medicine

## 2017-05-18 ENCOUNTER — Ambulatory Visit (INDEPENDENT_AMBULATORY_CARE_PROVIDER_SITE_OTHER): Payer: 59 | Admitting: Internal Medicine

## 2017-05-18 ENCOUNTER — Other Ambulatory Visit (INDEPENDENT_AMBULATORY_CARE_PROVIDER_SITE_OTHER): Payer: 59

## 2017-05-18 ENCOUNTER — Encounter: Payer: Self-pay | Admitting: Internal Medicine

## 2017-05-18 DIAGNOSIS — K580 Irritable bowel syndrome with diarrhea: Secondary | ICD-10-CM

## 2017-05-18 DIAGNOSIS — G8929 Other chronic pain: Secondary | ICD-10-CM | POA: Diagnosis not present

## 2017-05-18 DIAGNOSIS — F909 Attention-deficit hyperactivity disorder, unspecified type: Secondary | ICD-10-CM

## 2017-05-18 DIAGNOSIS — I10 Essential (primary) hypertension: Secondary | ICD-10-CM

## 2017-05-18 DIAGNOSIS — Z7721 Contact with and (suspected) exposure to potentially hazardous body fluids: Secondary | ICD-10-CM | POA: Insufficient documentation

## 2017-05-18 DIAGNOSIS — M5440 Lumbago with sciatica, unspecified side: Secondary | ICD-10-CM

## 2017-05-18 LAB — BASIC METABOLIC PANEL
BUN: 16 mg/dL (ref 6–23)
CHLORIDE: 104 meq/L (ref 96–112)
CO2: 27 meq/L (ref 19–32)
CREATININE: 1.18 mg/dL (ref 0.40–1.50)
Calcium: 9.4 mg/dL (ref 8.4–10.5)
GFR: 69.42 mL/min (ref >60.00)
Glucose, Bld: 101 mg/dL — ABNORMAL HIGH (ref 70–99)
POTASSIUM: 4.4 meq/L (ref 3.5–5.1)
Sodium: 137 mEq/L (ref 135–145)

## 2017-05-18 LAB — CBC WITH DIFFERENTIAL/PLATELET
BASOS ABS: 0 10*3/uL (ref 0.0–0.1)
Basophils Relative: 0.8 % (ref 0.0–3.0)
Eosinophils Absolute: 0.2 10*3/uL (ref 0.0–0.7)
Eosinophils Relative: 3.3 % (ref 0.0–5.0)
HEMATOCRIT: 41.9 % (ref 39.0–52.0)
HEMOGLOBIN: 13.4 g/dL (ref 13.0–17.0)
LYMPHS PCT: 23.4 % (ref 12.0–46.0)
Lymphs Abs: 1.3 10*3/uL (ref 0.7–4.0)
MCHC: 32.1 g/dL (ref 30.0–36.0)
MCV: 79.5 fl (ref 78.0–100.0)
MONOS PCT: 9.7 % (ref 3.0–12.0)
Monocytes Absolute: 0.6 10*3/uL (ref 0.1–1.0)
Neutro Abs: 3.6 10*3/uL (ref 1.4–7.7)
Neutrophils Relative %: 62.8 % (ref 43.0–77.0)
Platelets: 264 10*3/uL (ref 150.0–400.0)
RBC: 5.27 Mil/uL (ref 4.22–5.81)
RDW: 15.3 % (ref 11.5–15.5)
WBC: 5.7 10*3/uL (ref 4.0–10.5)

## 2017-05-18 LAB — HEPATIC FUNCTION PANEL
ALT: 26 U/L (ref 0–53)
AST: 20 U/L (ref 0–37)
Albumin: 4.6 g/dL (ref 3.5–5.2)
Alkaline Phosphatase: 61 U/L (ref 39–117)
Bilirubin, Direct: 0.1 mg/dL (ref 0.0–0.3)
TOTAL PROTEIN: 7.2 g/dL (ref 6.0–8.3)
Total Bilirubin: 0.5 mg/dL (ref 0.2–1.2)

## 2017-05-18 MED ORDER — HYDROCODONE-ACETAMINOPHEN 10-325 MG PO TABS: ORAL_TABLET | ORAL | 0 refills | Status: DC

## 2017-05-18 MED ORDER — AMPHETAMINE-DEXTROAMPHETAMINE 10 MG PO TABS: 10.0000 mg | ORAL_TABLET | Freq: Two times a day (BID) | ORAL | 0 refills | Status: DC

## 2017-05-18 MED ORDER — TRUVADA 200-300 MG PO TABS: 1.0000 | ORAL_TABLET | Freq: Every day | ORAL | 1 refills | Status: DC

## 2017-05-18 NOTE — Assessment & Plan Note (Signed)
Norco prn  Potential benefits of a long term opioids use as well as potential risks (i.e. addiction risk, apnea etc) and complications (i.e. Somnolence, constipation and others) were explained to the patient and were aknowledged. 

## 2017-05-18 NOTE — Progress Notes (Signed)
Subjective:  Patient ID: Mark Mcgee, male    DOB: 1966/01/11  Age: 51 y.o. MRN: [id number]  CC: No chief complaint on file.   HPI Mark Mcgee presents for IBS-D, ADD, LBP f/u. On Truvada...  Outpatient Medications Prior to Visit  Medication Sig Dispense Refill  . acyclovir (ZOVIRAX) 400 MG tablet Take 1 tablet (400 mg total) by mouth 4 (four) times daily. 30 tablet 3  . alprazolam (XANAX) 2 MG tablet Take 1 tablet (2 mg total) by mouth 3 (three) times daily. 90 tablet 3  . amLODipine-olmesartan (AZOR) 10-40 MG tablet Take 1 tablet by mouth daily. 90 tablet 3  . amphetamine-dextroamphetamine (ADDERALL) 10 MG tablet Take 1 tablet (10 mg total) by mouth 2 (two) times daily with breakfast and lunch. 60 tablet 0  . b complex vitamins tablet Take 1 tablet by mouth daily.      . Cholecalciferol 1000 UNITS tablet Take 2,000 Units by mouth daily.      [name] CIALIS 5 MG tablet Take 1 tablet (5 mg total) by mouth daily. 90 tablet 2  . diphenoxylate-atropine (LOMOTIL) 2.5-0.025 MG tablet TAKE 1 OR 2 TABLETS BY MOUTH 4 TIMES DAILY AS NEEDED FOR DIARRHEA OR LOOSE STOOLS MAX OF 8 TABLETS PER DAY 60 tablet 3  . Eluxadoline (VIBERZI) 100 MG TABS Take 1 tablet (100 mg total) by mouth 2 (two) times daily. 180 tablet 2  . Flaxseed, Linseed, (FLAX SEEDS) POWD Take by mouth 2 (two) times daily.      [name] glycopyrrolate (ROBINUL) 2 MG tablet Take 1 tablet (2 mg total) by mouth 2 (two) times daily. 180 tablet 0  . HYDROcodone-acetaminophen (NORCO) 10-325 MG tablet TAKE 1 TABLET BY MOUTH EVERY 6 HOURS AS NEEDED FOR PAIN 120 tablet 0  . hyoscyamine (LEVSIN, ANASPAZ) 0.125 MG tablet TAKE 1 TO 2 TABLETS BY MOUTH EVERY 4 HOURS AS NEEDED 180 tablet 1  . ibuprofen (ADVIL,MOTRIN) 400 MG tablet Take 1 tablet (400 mg total) by mouth 4 (four) times daily as needed. 60 tablet 5  . ipratropium (ATROVENT) 0.03 % nasal spray Place 2 sprays into both nostrils 2 (two) times daily. 30 mL 5  . Multiple Vitamin (MULTIVITAMIN)  capsule Take by mouth.    . ondansetron (ZOFRAN) 4 MG tablet Take 1 tablet (4 mg total) by mouth every 8 (eight) hours as needed for nausea or vomiting. 30 tablet 0  . testosterone cypionate (DEPOTESTOSTERONE CYPIONATE) 200 MG/ML injection Inject 1 mL (200 mg total) into the muscle every 14 (fourteen) days. 10 mL 5  . TRUVADA 200-300 MG tablet Take 1 tablet by mouth daily. 90 tablet 2  . Vitamin D, Ergocalciferol, (DRISDOL) 50000 UNITS CAPS capsule TAKE 1 CAPSULE BY MOUTH ONCE A WEEK. 8 capsule 0   No facility-administered medications prior to visit.     ROS Review of Systems  Constitutional: Negative for appetite change, fatigue and unexpected weight change.  HENT: Negative for congestion, nosebleeds, sneezing, sore throat and trouble swallowing.   Eyes: Negative for itching and visual disturbance.  Respiratory: Negative for cough.   Cardiovascular: Negative for chest pain, palpitations and leg swelling.  Gastrointestinal: Positive for diarrhea. Negative for abdominal distention, blood in stool and nausea.  Genitourinary: Negative for frequency and hematuria.  Musculoskeletal: Positive for back pain. Negative for gait problem, joint swelling and neck pain.  Skin: Negative for rash.  Neurological: Negative for dizziness, tremors, speech difficulty and weakness.  Psychiatric/Behavioral: Positive for decreased concentration. Negative for agitation, dysphoric mood  and sleep disturbance. The patient is nervous/anxious.     Objective:  BP 106/68   Pulse 76   Temp 98.6 F (37 C)   Wt 201 lb (91.2 kg)   SpO2 98%   BMI 31.48 kg/m   BP Readings from Last 3 Encounters:  05/18/17 106/68  03/15/17 112/62  02/02/17 (!) 110/54    Wt Readings from Last 3 Encounters:  05/18/17 201 lb (91.2 kg)  03/15/17 201 lb (91.2 kg)  02/02/17 196 lb (88.9 kg)    Physical Exam  Constitutional: He is oriented to person, place, and time. He appears well-developed. No distress.  NAD  HENT:    Mouth/Throat: Oropharynx is clear and moist.  Eyes: Pupils are equal, round, and reactive to light. Conjunctivae are normal.  Neck: Normal range of motion. No JVD present. No thyromegaly present.  Cardiovascular: Normal rate, regular rhythm, normal heart sounds and intact distal pulses.  Exam reveals no gallop and no friction rub.   No murmur heard. Pulmonary/Chest: Effort normal and breath sounds normal. No respiratory distress. He has no wheezes. He has no rales. He exhibits no tenderness.  Abdominal: Soft. Bowel sounds are normal. He exhibits no distension and no mass. There is no tenderness. There is no rebound and no guarding.  Musculoskeletal: Normal range of motion. He exhibits tenderness. He exhibits no edema.  Lymphadenopathy:    He has no cervical adenopathy.  Neurological: He is alert and oriented to person, place, and time. He has normal reflexes. No cranial nerve deficit. He exhibits normal muscle tone. He displays a negative Romberg sign. Coordination and gait normal.  Skin: Skin is warm and dry. No rash noted.  Psychiatric: He has a normal mood and affect. His behavior is normal. Judgment and thought content normal.    Lab Results  Component Value Date   WBC 4.7 03/15/2017   HGB 12.8 (L) 03/15/2017   HCT 40.3 03/15/2017   PLT 251.0 03/15/2017   GLUCOSE 103 (H) 03/15/2017   CHOL 166 06/06/2015   TRIG 60.0 06/06/2015   HDL 73.70 06/06/2015   LDLDIRECT 127.1 08/30/2012   LDLCALC 80 06/06/2015   ALT 18 03/15/2017   AST 15 03/15/2017   NA 141 03/15/2017   K 5.6 (H) 03/15/2017   CL 106 03/15/2017   CREATININE 1.28 03/15/2017   BUN 22 03/15/2017   CO2 28 03/15/2017   TSH 1.06 12/19/2015   PSA 0.60 06/06/2015    Ct Abdomen Pelvis Wo Contrast  Result Date: 03/29/2017 CLINICAL DATA:  51 year old male with left abdominal pain and vomiting for 1 week. EXAM: CT ABDOMEN AND PELVIS WITHOUT CONTRAST TECHNIQUE: Multidetector CT imaging of the abdomen and pelvis was performed  following the standard protocol without IV contrast. COMPARISON:  11/27/2010 and prior CTs FINDINGS: Please note that parenchymal abnormalities may be missed without intravenous contrast. Lower chest: No acute abnormality Hepatobiliary: The liver and gallbladder are unremarkable. No biliary dilatation. Pancreas: Unremarkable Spleen: Unremarkable Adrenals/Urinary Tract: The kidneys, adrenal glands and bladder are unremarkable. Stomach/Bowel: Stomach is within normal limits. Appendix appears normal. No evidence of bowel wall thickening, distention, or inflammatory changes. New A large amount of stool in the rectum is noted. Vascular/Lymphatic: No significant vascular findings are present. No enlarged abdominal or pelvic lymph nodes. Reproductive: Prostate is unremarkable. Other: No abdominal wall hernia or abnormality. No abdominopelvic ascites. Musculoskeletal: No acute or significant osseous findings. IMPRESSION: No acute abnormality. Large amount of rectal stool without other significant abnormality. Electronically Signed   By: Tinnie Gens  Hu M.D.   On: 03/29/2017 14:28    Assessment & Plan:   There are no diagnoses linked to this encounter. I am having Mr. Schwed maintain his b complex vitamins, Flax Seeds, Cholecalciferol, Vitamin D (Ergocalciferol), ibuprofen, multivitamin, acyclovir, alprazolam, amLODipine-olmesartan, CIALIS, diphenoxylate-atropine, glycopyrrolate, hyoscyamine, ipratropium, testosterone cypionate, TRUVADA, Eluxadoline, HYDROcodone-acetaminophen, amphetamine-dextroamphetamine, and ondansetron.  No orders of the defined types were placed in this encounter.    Follow-up: No Follow-up on file.  Sonda Primes, MD

## 2017-05-18 NOTE — Assessment & Plan Note (Signed)
adderall po

## 2017-05-18 NOTE — Assessment & Plan Note (Signed)
Viberzi - 100 mg bid

## 2017-05-18 NOTE — Assessment & Plan Note (Signed)
On Truvada Labs 

## 2017-05-18 NOTE — Addendum Note (Signed)
Addended by: Scarlett Presto on: 05/18/2017 10:13 AM   Modules accepted: Orders

## 2017-05-18 NOTE — Assessment & Plan Note (Signed)
BP Readings from Last 3 Encounters:  05/18/17 106/68  03/15/17 112/62  02/02/17 (!) 110/54

## 2017-05-19 LAB — RPR

## 2017-05-20 LAB — GC/CHLAMYDIA PROBE AMP
CT PROBE, AMP APTIMA: NOT DETECTED
GC PROBE AMP APTIMA: NOT DETECTED

## 2017-05-20 LAB — HIV-1 RNA QUANT-NO REFLEX-BLD
HIV 1 RNA Quant: <20 copies/mL
HIV-1 RNA Quant, Log: <1.3 Log copies/mL

## 2017-05-20 MED FILL — TRUVADA 200-300 MG TABS: 200-300 | 30 days supply | Qty: 30 | Fill #0

## 2017-05-20 MED FILL — CIALIS 5 MG TABLET: 5 | 30 days supply | Qty: 30 | Fill #2

## 2017-06-03 ENCOUNTER — Other Ambulatory Visit: Payer: Self-pay | Admitting: Internal Medicine

## 2017-06-03 MED FILL — HYOSCYAMINE SULF 0.125 MG T: 0.125 | 15 days supply | Qty: 180 | Fill #0

## 2017-06-03 MED FILL — ALPRAZolam 2 MG TABS: 2 | 30 days supply | Qty: 90 | Fill #1

## 2017-06-03 MED FILL — TESTOSTERONE CYP 200 MG/ML: 200 | 84 days supply | Qty: 6 | Fill #1

## 2017-06-03 MED FILL — GLYCOPYRROLATE 2 MG TAB: 2 | 90 days supply | Qty: 180 | Fill #0

## 2017-06-03 MED FILL — AMLODIPINE-OLMESARTAN 10-40: 10-40 | 90 days supply | Qty: 90 | Fill #1

## 2017-06-07 MED FILL — DEXTROAMP-AMPHETAMIN 10 MG: 10 | 30 days supply | Qty: 60 | Fill #0

## 2017-06-07 MED FILL — HYDROCODON-APAP 10-325: 10-325 | 30 days supply | Qty: 120 | Fill #0

## 2017-06-07 MED FILL — ACYCLOVIR 400 MG TABLET: 400 | 7 days supply | Qty: 30 | Fill #0

## 2017-06-14 ENCOUNTER — Ambulatory Visit: Payer: Self-pay

## 2017-06-14 ENCOUNTER — Ambulatory Visit (INDEPENDENT_AMBULATORY_CARE_PROVIDER_SITE_OTHER): Payer: 59 | Admitting: Family Medicine

## 2017-06-14 ENCOUNTER — Encounter: Payer: Self-pay | Admitting: Family Medicine

## 2017-06-14 VITALS — BP 104/70 | Ht 67.0 in | Wt 191.0 lb

## 2017-06-14 DIAGNOSIS — M79601 Pain in right arm: Secondary | ICD-10-CM

## 2017-06-14 MED ORDER — METHYLPREDNISOLONE ACETATE 40 MG/ML IJ SUSP
40.0000 mg | Freq: Once | INTRAMUSCULAR | Status: AC
  Administered 2017-06-14: 40 mg via INTRA_ARTICULAR

## 2017-06-15 ENCOUNTER — Encounter: Payer: Self-pay | Admitting: Family Medicine

## 2017-06-15 DIAGNOSIS — M79601 Pain in right arm: Secondary | ICD-10-CM | POA: Insufficient documentation

## 2017-06-15 NOTE — Progress Notes (Signed)
    CHIEF COMPLAINT / HPI:   right elbow pain.  Started several weeks ago.  In retrospect, he recalls going on a kayak trip and after that his symptoms were much .  Stiffness and pain worse after he uses it a lot but does not actually while using the computer/mouse. He works in [employer] and does a lot of computer/mouse work.  He is right-hand dominant.  Symptoms now are so severe that he has difficulty picking up a cup.  He also has some numbness and tingling that radiates into his ring and pinky finger intermittently.  He occasionally has similar symptoms of numbness and tingling in that distribution early in the morning.  REVIEW OF SYSTEMS:  No unusual weight change.  No fever.  No other unusual arthralgias or myalgias.  No rash.  No headaches.  PERTINENT  PMH / PSH: I have reviewed the patient's medications, allergies, past medical and surgical history, smoking status and updated in the EMR as appropriate. No prior history of right elbow injury or surgery No history of neuropathy   OBJECTIVE:  GENERAL: Well developed male no acute distress ELBOW: Right.  Tender to palpation over the lateral epicondyles and over the ulnar groove.  Positive Tinel over the ulnar groove. MSK: He has normal symmetrical grip st.  He has symmetrical strength in pronation and supination but has significant pain on the right side compared with the left.  He has pain with resistance against his extended long finger. VASCULAR: Radial pulses 2+ bilaterally symmetrical SKIN: Skin of the right upper extremity is normal any sign of lesion or rash.  Normal temperature. ULTRASOUND: There is some increased fluid in the area around the ulnar nerve in the ulnar groove at the elbow.  The nerve itself appears to be normal  And echogenicity.  The right lateral epicondyles as increased Doppler activity and there is a small amount of fluid at t  Origin of the extensor muscles.  PROCEDURE: INJECTION: Patient was  given informed consent, signed copy in the chart. Appropriate time out was taken. Area prepped and draped in usual sterile fashion. Ethyl chloride was  used for local anesthesia. A 21 gauge 1 1/2 inch needle was used.. 1 cc of methylprednisolone 40 mg/ml plus  1 cc of 1% lidocaine without epinephrine was injected into the Area of the lateral epicondyles of the right elbow using a(n) lateral approach.   The patient tolerated the procedure well. There were no complications. Post procedure instructions were given.   ASSESSMENT / PLAN: Please see problem oriented charting for details

## 2017-06-15 NOTE — Assessment & Plan Note (Signed)
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  Him home exercise program for lateral epicondylitis.  Corticosteroid injection today at   His request.  Follow-up 2-3 weeks if not improving.

## 2017-06-17 MED FILL — ACYCLOVIR 400 MG TABLET: 400 | 21 days supply | Qty: 90 | Fill #1

## 2017-06-17 MED FILL — TRUVADA 200-300 MG TABS: 200-300 | 30 days supply | Qty: 30 | Fill #1

## 2017-06-17 MED FILL — CIALIS 5 MG TABLET: 5 | 30 days supply | Qty: 30 | Fill #3

## 2017-07-05 ENCOUNTER — Encounter: Payer: Self-pay | Admitting: Internal Medicine

## 2017-07-08 ENCOUNTER — Other Ambulatory Visit: Payer: Self-pay | Admitting: Internal Medicine

## 2017-07-12 MED FILL — ACYCLOVIR 400 MG TABLET: 400 | 30 days supply | Qty: 120 | Fill #0

## 2017-07-12 MED FILL — TRUVADA 200-300 MG TABS: 200-300 | 30 days supply | Qty: 30 | Fill #2

## 2017-07-14 MED FILL — HYDROCODON-APAP 10-325: 10-325 | 30 days supply | Qty: 120 | Fill #0

## 2017-07-14 MED FILL — DEXTROAMP-AMP 10 MG TAB: 10 | 30 days supply | Qty: 60 | Fill #0

## 2017-07-19 MED FILL — ALPRAZolam 2 MG TABS: 2 | 30 days supply | Qty: 90 | Fill #2

## 2017-07-21 ENCOUNTER — Encounter: Payer: Self-pay | Admitting: Internal Medicine

## 2017-07-22 ENCOUNTER — Encounter: Payer: Self-pay | Admitting: Internal Medicine

## 2017-07-24 ENCOUNTER — Other Ambulatory Visit: Payer: Self-pay | Admitting: Internal Medicine

## 2017-07-24 MED ORDER — TADALAFIL 5 MG PO TABS: 5.0000 mg | ORAL_TABLET | Freq: Every day | ORAL | 2 refills | Status: DC

## 2017-07-24 MED ORDER — IBUPROFEN 400 MG PO TABS: 400.0000 mg | ORAL_TABLET | Freq: Two times a day (BID) | ORAL | 2 refills | Status: DC | PRN

## 2017-07-25 MED FILL — TADALAFIL 5 MG TABS: 5 | 90 days supply | Qty: 90 | Fill #0

## 2017-07-25 MED FILL — IBUPROFEN 400 MG TAB: 400 | 30 days supply | Qty: 60 | Fill #0

## 2017-08-16 MED FILL — TRUVADA 200-300 MG TABS: 200-300 | 30 days supply | Qty: 30 | Fill #3

## 2017-08-17 MED FILL — DEXTROAMP-AMPHETAMIN 10 MG: 10 | 30 days supply | Qty: 60 | Fill #0

## 2017-08-17 MED FILL — HYDROCODON-APAP 10-325: 10-325 | 30 days supply | Qty: 120 | Fill #0

## 2017-08-24 ENCOUNTER — Encounter: Payer: Self-pay | Admitting: Internal Medicine

## 2017-08-24 ENCOUNTER — Other Ambulatory Visit (INDEPENDENT_AMBULATORY_CARE_PROVIDER_SITE_OTHER): Payer: 59

## 2017-08-24 ENCOUNTER — Ambulatory Visit (INDEPENDENT_AMBULATORY_CARE_PROVIDER_SITE_OTHER): Payer: 59 | Admitting: Internal Medicine

## 2017-08-24 VITALS — BP 112/68 | HR 83 | Temp 98.8°F | Ht 67.0 in | Wt 203.0 lb

## 2017-08-24 DIAGNOSIS — G8929 Other chronic pain: Secondary | ICD-10-CM

## 2017-08-24 DIAGNOSIS — M5440 Lumbago with sciatica, unspecified side: Secondary | ICD-10-CM

## 2017-08-24 DIAGNOSIS — K580 Irritable bowel syndrome with diarrhea: Secondary | ICD-10-CM | POA: Diagnosis not present

## 2017-08-24 DIAGNOSIS — Z5181 Encounter for therapeutic drug level monitoring: Secondary | ICD-10-CM

## 2017-08-24 DIAGNOSIS — I10 Essential (primary) hypertension: Secondary | ICD-10-CM

## 2017-08-24 DIAGNOSIS — F411 Generalized anxiety disorder: Secondary | ICD-10-CM

## 2017-08-24 LAB — HEPATIC FUNCTION PANEL
ALK PHOS: 73 U/L (ref 39–117)
ALT: 24 U/L (ref 0–53)
AST: 15 U/L (ref 0–37)
Albumin: 4.8 g/dL (ref 3.5–5.2)
BILIRUBIN DIRECT: 0.1 mg/dL (ref 0.0–0.3)
BILIRUBIN TOTAL: 0.6 mg/dL (ref 0.2–1.2)
Total Protein: 7.4 g/dL (ref 6.0–8.3)

## 2017-08-24 MED ORDER — AMPHETAMINE-DEXTROAMPHETAMINE 10 MG PO TABS: 10.0000 mg | ORAL_TABLET | Freq: Two times a day (BID) | ORAL | 0 refills | Status: DC

## 2017-08-24 MED ORDER — HYDROCODONE-ACETAMINOPHEN 10-325 MG PO TABS: ORAL_TABLET | ORAL | 0 refills | Status: DC

## 2017-08-24 MED ORDER — ELUXADOLINE 100 MG PO TABS: 1.0000 | ORAL_TABLET | Freq: Two times a day (BID) | ORAL | 1 refills | Status: DC

## 2017-08-24 NOTE — Assessment & Plan Note (Signed)
Viberzi

## 2017-08-24 NOTE — Addendum Note (Signed)
Addended by: Tresa GarterPLOTNIKOV, Hind Chesler V on: 08/24/2017 11:43 AM   Modules accepted: Orders

## 2017-08-24 NOTE — Assessment & Plan Note (Signed)
Om Xanax prn -

## 2017-08-24 NOTE — Assessment & Plan Note (Signed)
Norco prn  Potential benefits of a long term opioids use as well as potential risks (i.e. addiction risk, apnea etc) and complications (i.e. Somnolence, constipation and others) were explained to the patient and were aknowledged. 

## 2017-08-24 NOTE — Assessment & Plan Note (Signed)
BP Readings from Last 3 Encounters:  08/24/17 112/68  06/14/17 104/70  05/18/17 106/68

## 2017-08-24 NOTE — Assessment & Plan Note (Signed)
On Truvada Labs

## 2017-08-24 NOTE — Progress Notes (Signed)
Subjective:  Patient ID: Mark Mcgee, male    DOB: 07-07-1966  Age: 51 y.o. MRN: [id number]  CC: No chief complaint on file.   HPI Mark Mcgee presents for HTN, IBS, chronic pain f/u  Outpatient Medications Prior to Visit  Medication Sig Dispense Refill  . acyclovir (ZOVIRAX) 400 MG tablet TAKE 1 TABLET BY MOUTH 4 TIMES DAILY 120 tablet 1  . alprazolam (XANAX) 2 MG tablet Take 1 tablet (2 mg total) by mouth 3 (three) times daily. 90 tablet 3  . amLODipine-olmesartan (AZOR) 10-40 MG tablet Take 1 tablet by mouth daily. 90 tablet 3  . amphetamine-dextroamphetamine (ADDERALL) 10 MG tablet Take 1 tablet (10 mg total) by mouth 2 (two) times daily with breakfast and lunch. 60 tablet 0  . b complex vitamins tablet Take 1 tablet by mouth daily.      . Cholecalciferol 1000 UNITS tablet Take 2,000 Units by mouth daily.      . diphenoxylate-atropine (LOMOTIL) 2.5-0.025 MG tablet TAKE 1 OR 2 TABLETS BY MOUTH 4 TIMES DAILY AS NEEDED FOR DIARRHEA OR LOOSE STOOLS MAX OF 8 TABLETS PER DAY 60 tablet 3  . Eluxadoline (VIBERZI) 100 MG TABS Take 1 tablet (100 mg total) by mouth 2 (two) times daily. 180 tablet 2  . Flaxseed, Linseed, (FLAX SEEDS) POWD Take by mouth 2 (two) times daily.      [name]. glycopyrrolate (ROBINUL) 2 MG tablet Take 1 tablet (2 mg total) by mouth 2 (two) times daily. 180 tablet 0  . HYDROcodone-acetaminophen (NORCO) 10-325 MG tablet TAKE 1 TABLET BY MOUTH EVERY 6 HOURS AS NEEDED FOR PAIN 120 tablet 0  . hyoscyamine (LEVSIN, ANASPAZ) 0.125 MG tablet TAKE 1 TO 2 TABLETS BY MOUTH EVERY 4 HOURS AS NEEDED 180 tablet 1  . ibuprofen (ADVIL,MOTRIN) 400 MG tablet Take 1 tablet (400 mg total) by mouth 2 (two) times daily as needed. 60 tablet 2  . ipratropium (ATROVENT) 0.03 % nasal spray Place 2 sprays into both nostrils 2 (two) times daily. 30 mL 5  . Multiple Vitamin (MULTIVITAMIN) capsule Take by mouth.    . ondansetron (ZOFRAN) 4 MG tablet Take 1 tablet (4 mg total) by mouth every 8  (eight) hours as needed for nausea or vomiting. 30 tablet 0  . tadalafil (CIALIS) 5 MG tablet Take 1 tablet (5 mg total) by mouth daily. 90 tablet 2  . testosterone cypionate (DEPOTESTOSTERONE CYPIONATE) 200 MG/ML injection Inject 1 mL (200 mg total) into the muscle every 14 (fourteen) days. 10 mL 5  . TRUVADA 200-300 MG tablet Take 1 tablet by mouth daily. 90 tablet 1  . Vitamin D, Ergocalciferol, (DRISDOL) 50000 UNITS CAPS capsule TAKE 1 CAPSULE BY MOUTH ONCE A WEEK. 8 capsule 0   No facility-administered medications prior to visit.     ROS Review of Systems  Constitutional: Positive for fatigue. Negative for appetite change and unexpected weight change.  HENT: Negative for congestion, nosebleeds, sneezing, sore throat and trouble swallowing.   Eyes: Negative for itching and visual disturbance.  Respiratory: Negative for cough.   Cardiovascular: Negative for chest pain, palpitations and leg swelling.  Gastrointestinal: Positive for diarrhea. Negative for abdominal distention, blood in stool and nausea.  Genitourinary: Negative for frequency and hematuria.  Musculoskeletal: Positive for arthralgias and back pain. Negative for gait problem, joint swelling and neck pain.  Skin: Negative for rash.  Neurological: Negative for dizziness, tremors, speech difficulty and weakness.  Psychiatric/Behavioral: Positive for sleep disturbance. Negative for agitation, dysphoric mood  and suicidal ideas. The patient is nervous/anxious.     Objective:  BP 112/68 (BP Location: Left Arm, Patient Position: Sitting, Cuff Size: Large)   Pulse 83   Temp 98.8 F (37.1 C) (Oral)   Ht 5\' 7"  (1.702 m)   Wt 203 lb (92.1 kg)   SpO2 99%   BMI 31.79 kg/m   BP Readings from Last 3 Encounters:  08/24/17 112/68  06/14/17 104/70  05/18/17 106/68    Wt Readings from Last 3 Encounters:  08/24/17 203 lb (92.1 kg)  06/14/17 191 lb (86.6 kg)  05/18/17 201 lb (91.2 kg)    Physical Exam  Constitutional: He is  oriented to person, place, and time. He appears well-developed. No distress.  NAD  HENT:  Mouth/Throat: Oropharynx is clear and moist.  Eyes: Conjunctivae are normal. Pupils are equal, round, and reactive to light.  Neck: Normal range of motion. No JVD present. No thyromegaly present.  Cardiovascular: Normal rate, regular rhythm, normal heart sounds and intact distal pulses. Exam reveals no gallop and no friction rub.  No murmur heard. Pulmonary/Chest: Effort normal and breath sounds normal. No respiratory distress. He has no wheezes. He has no rales. He exhibits no tenderness.  Abdominal: Soft. Bowel sounds are normal. He exhibits no distension and no mass. There is no tenderness. There is no rebound and no guarding.  Musculoskeletal: Normal range of motion. He exhibits no edema or tenderness.  Lymphadenopathy:    He has no cervical adenopathy.  Neurological: He is alert and oriented to person, place, and time. He has normal reflexes. No cranial nerve deficit. He exhibits normal muscle tone. He displays a negative Romberg sign. Coordination and gait normal.  Skin: Skin is warm and dry. No rash noted.  Psychiatric: He has a normal mood and affect. His behavior is normal. Judgment and thought content normal.    Lab Results  Component Value Date   WBC 5.7 05/18/2017   HGB 13.4 05/18/2017   HCT 41.9 05/18/2017   PLT 264.0 05/18/2017   GLUCOSE 101 (H) 05/18/2017   CHOL 166 06/06/2015   TRIG 60.0 06/06/2015   HDL 73.70 06/06/2015   LDLDIRECT 127.1 08/30/2012   LDLCALC 80 06/06/2015   ALT 26 05/18/2017   AST 20 05/18/2017   NA 137 05/18/2017   K 4.4 05/18/2017   CL 104 05/18/2017   CREATININE 1.18 05/18/2017   BUN 16 05/18/2017   CO2 27 05/18/2017   TSH 1.06 12/19/2015   PSA 0.60 06/06/2015    Ct Abdomen Pelvis Wo Contrast  Result Date: 03/29/2017 CLINICAL DATA:  52 year old male with left abdominal pain and vomiting for 1 week. EXAM: CT ABDOMEN AND PELVIS WITHOUT CONTRAST  TECHNIQUE: Multidetector CT imaging of the abdomen and pelvis was performed following the standard protocol without IV contrast. COMPARISON:  11/27/2010 and prior CTs FINDINGS: Please note that parenchymal abnormalities may be missed without intravenous contrast. Lower chest: No acute abnormality Hepatobiliary: The liver and gallbladder are unremarkable. No biliary dilatation. Pancreas: Unremarkable Spleen: Unremarkable Adrenals/Urinary Tract: The kidneys, adrenal glands and bladder are unremarkable. Stomach/Bowel: Stomach is within normal limits. Appendix appears normal. No evidence of bowel wall thickening, distention, or inflammatory changes. New A large amount of stool in the rectum is noted. Vascular/Lymphatic: No significant vascular findings are present. No enlarged abdominal or pelvic lymph nodes. Reproductive: Prostate is unremarkable. Other: No abdominal wall hernia or abnormality. No abdominopelvic ascites. Musculoskeletal: No acute or significant osseous findings. IMPRESSION: No acute abnormality. Large amount of rectal  stool without other significant abnormality. Electronically Signed   By: Harmon PierJeffrey  Hu M.D.   On: 03/29/2017 14:28    Assessment & Plan:   There are no diagnoses linked to this encounter. I am having Mark Mcgee maintain his b complex vitamins, Flax Seeds, Cholecalciferol, Vitamin D (Ergocalciferol), multivitamin, alprazolam, amLODipine-olmesartan, diphenoxylate-atropine, glycopyrrolate, hyoscyamine, ipratropium, testosterone cypionate, Eluxadoline, ondansetron, TRUVADA, amphetamine-dextroamphetamine, HYDROcodone-acetaminophen, acyclovir, tadalafil, and ibuprofen.  No orders of the defined types were placed in this encounter.    Follow-up: No Follow-up on file.  Sonda PrimesAlex Plotnikov, MD

## 2017-08-26 LAB — HIV-1 RNA QUANT-NO REFLEX-BLD
HIV 1 RNA Quant: <20 copies/mL
HIV-1 RNA QUANT, LOG: NOT DETECTED {Log_copies}/mL

## 2017-08-26 LAB — RPR: RPR Ser Ql: NONREACTIVE

## 2017-09-09 ENCOUNTER — Other Ambulatory Visit: Payer: Self-pay | Admitting: Internal Medicine

## 2017-09-10 NOTE — Telephone Encounter (Signed)
Please advise about refill in Dr. Plotnikovs absence. 

## 2017-09-13 MED FILL — TRUVADA 200-300 MG TABS: 200-300 | 30 days supply | Qty: 30 | Fill #4

## 2017-09-13 MED FILL — IBUPROFEN 400 MG TAB: 400 | 30 days supply | Qty: 60 | Fill #1

## 2017-09-13 MED FILL — VIBERZI 100 MG TABLET: 100 | 90 days supply | Qty: 180 | Fill #0

## 2017-09-13 MED FILL — ALPRAZolam 2 MG TABS: 2 | 30 days supply | Qty: 90 | Fill #0

## 2017-09-13 MED FILL — AMLODIPINE-OLMESARTAN 10-40: 10-40 | 30 days supply | Qty: 30 | Fill #2

## 2017-09-15 MED FILL — HYDROCODON-APAP 10-325: 10-325 | 30 days supply | Qty: 120 | Fill #0

## 2017-09-15 MED FILL — DEXTROAMP-AMP 10 MG TAB: 10 | 30 days supply | Qty: 60 | Fill #0

## 2017-10-19 MED FILL — TRUVADA 200-300 MG TABS: 200-300 | 30 days supply | Qty: 30 | Fill #5

## 2017-10-19 MED FILL — IBUPROFEN 400 MG TAB: 400 | 30 days supply | Qty: 60 | Fill #2

## 2017-10-19 MED FILL — AMPHETAMINE-DEXTROAMPHETAMI: 10 | 30 days supply | Qty: 60 | Fill #0

## 2017-10-19 MED FILL — HYDROCODON-APAP 10-325: 10-325 | 30 days supply | Qty: 120 | Fill #0

## 2017-10-19 MED FILL — AMLODIPINE-OLMESARTAN 10-40: 10-40 | 30 days supply | Qty: 30 | Fill #3

## 2017-10-21 MED FILL — TADALAFIL 5 MG TABS: 5 | 90 days supply | Qty: 90 | Fill #1

## 2017-10-25 ENCOUNTER — Other Ambulatory Visit: Payer: Self-pay | Admitting: Internal Medicine

## 2017-10-26 NOTE — Addendum Note (Signed)
Addended by: Scarlett PrestoFRIEDENBACH, Darian Cansler on: 10/26/2017 10:30 AM   Modules accepted: Orders

## 2017-10-27 ENCOUNTER — Other Ambulatory Visit: Payer: Self-pay | Admitting: Internal Medicine

## 2017-10-28 ENCOUNTER — Other Ambulatory Visit: Payer: Self-pay | Admitting: Internal Medicine

## 2017-10-28 ENCOUNTER — Encounter: Payer: Self-pay | Admitting: Internal Medicine

## 2017-10-28 MED ORDER — VITAMIN D (ERGOCALCIFEROL) 1.25 MG (50000 UNIT) PO CAPS: 50000.0000 [IU] | ORAL_CAPSULE | ORAL | 0 refills | Status: DC

## 2017-10-30 MED ORDER — ALPRAZOLAM 2 MG PO TABS: 2.0000 mg | ORAL_TABLET | Freq: Three times a day (TID) | ORAL | 1 refills | Status: DC

## 2017-10-31 NOTE — Telephone Encounter (Signed)
RX was sent to wrong pharmacy. Please resend.

## 2017-10-31 NOTE — Telephone Encounter (Signed)
Pls advise if ok to refill../lmb 

## 2017-11-02 ENCOUNTER — Other Ambulatory Visit: Payer: Self-pay | Admitting: Internal Medicine

## 2017-11-02 MED ORDER — ALPRAZOLAM 2 MG PO TABS: 2.0000 mg | ORAL_TABLET | Freq: Three times a day (TID) | ORAL | 1 refills | Status: DC

## 2017-11-22 ENCOUNTER — Other Ambulatory Visit: Payer: Self-pay | Admitting: Internal Medicine

## 2017-11-22 MED FILL — ALPRAZolam 2 MG TABS: 2 | 30 days supply | Qty: 90 | Fill #0

## 2017-11-22 MED FILL — AMLODIPINE-OLMESARTAN 10-40: 10-40 | 30 days supply | Qty: 30 | Fill #4

## 2017-11-23 MED FILL — TRUVADA 200-300 MG TABS: 200-300 | 30 days supply | Qty: 30 | Fill #0

## 2017-11-23 MED FILL — IBUPROFEN 400 MG TAB: 400 | 30 days supply | Qty: 60 | Fill #0

## 2017-11-24 ENCOUNTER — Encounter: Payer: Self-pay | Admitting: Internal Medicine

## 2017-11-24 ENCOUNTER — Ambulatory Visit (INDEPENDENT_AMBULATORY_CARE_PROVIDER_SITE_OTHER): Payer: 59 | Admitting: Internal Medicine

## 2017-11-24 ENCOUNTER — Other Ambulatory Visit (INDEPENDENT_AMBULATORY_CARE_PROVIDER_SITE_OTHER): Payer: 59

## 2017-11-24 VITALS — BP 108/64 | HR 64 | Temp 98.3°F | Ht 67.0 in | Wt 205.0 lb

## 2017-11-24 DIAGNOSIS — F988 Other specified behavioral and emotional disorders with onset usually occurring in childhood and adolescence: Secondary | ICD-10-CM | POA: Diagnosis not present

## 2017-11-24 DIAGNOSIS — N410 Acute prostatitis: Secondary | ICD-10-CM | POA: Diagnosis not present

## 2017-11-24 DIAGNOSIS — F411 Generalized anxiety disorder: Secondary | ICD-10-CM

## 2017-11-24 DIAGNOSIS — M5440 Lumbago with sciatica, unspecified side: Secondary | ICD-10-CM

## 2017-11-24 DIAGNOSIS — G8929 Other chronic pain: Secondary | ICD-10-CM

## 2017-11-24 DIAGNOSIS — K591 Functional diarrhea: Secondary | ICD-10-CM | POA: Diagnosis not present

## 2017-11-24 DIAGNOSIS — Z0000 Encounter for general adult medical examination without abnormal findings: Secondary | ICD-10-CM | POA: Diagnosis not present

## 2017-11-24 DIAGNOSIS — Z7721 Contact with and (suspected) exposure to potentially hazardous body fluids: Secondary | ICD-10-CM | POA: Diagnosis not present

## 2017-11-24 LAB — URINALYSIS
BILIRUBIN URINE: NEGATIVE
Hgb urine dipstick: NEGATIVE
Ketones, ur: NEGATIVE
Leukocytes, UA: NEGATIVE
NITRITE: NEGATIVE
PH: 6 (ref 5.0–8.0)
Specific Gravity, Urine: 1.01 (ref 1.000–1.030)
Total Protein, Urine: NEGATIVE
Urine Glucose: NEGATIVE
Urobilinogen, UA: 0.2 (ref 0.0–1.0)

## 2017-11-24 LAB — CBC WITH DIFFERENTIAL/PLATELET
BASOS ABS: 0.1 10*3/uL (ref 0.0–0.1)
Basophils Relative: 1 % (ref 0.0–3.0)
Eosinophils Absolute: 0.3 10*3/uL (ref 0.0–0.7)
Eosinophils Relative: 6.1 % — ABNORMAL HIGH (ref 0.0–5.0)
HEMATOCRIT: 41.1 % (ref 39.0–52.0)
Hemoglobin: 13.2 g/dL (ref 13.0–17.0)
LYMPHS ABS: 1.6 10*3/uL (ref 0.7–4.0)
LYMPHS PCT: 30.5 % (ref 12.0–46.0)
MCHC: 32.1 g/dL (ref 30.0–36.0)
MCV: 78.2 fl (ref 78.0–100.0)
MONOS PCT: 8.6 % (ref 3.0–12.0)
Monocytes Absolute: 0.4 10*3/uL (ref 0.1–1.0)
NEUTROS PCT: 53.8 % (ref 43.0–77.0)
Neutro Abs: 2.7 10*3/uL (ref 1.4–7.7)
Platelets: 265 10*3/uL (ref 150.0–400.0)
RBC: 5.26 Mil/uL (ref 4.22–5.81)
RDW: 14.7 % (ref 11.5–15.5)
WBC: 5.1 10*3/uL (ref 4.0–10.5)

## 2017-11-24 LAB — BASIC METABOLIC PANEL
BUN: 16 mg/dL (ref 6–23)
CO2: 30 mEq/L (ref 19–32)
Calcium: 10 mg/dL (ref 8.4–10.5)
Chloride: 104 mEq/L (ref 96–112)
Creatinine, Ser: 1.21 mg/dL (ref 0.40–1.50)
GFR: 67.29 mL/min (ref >60.00)
GLUCOSE: 97 mg/dL (ref 70–99)
Potassium: 4.9 mEq/L (ref 3.5–5.1)
SODIUM: 142 meq/L (ref 135–145)

## 2017-11-24 LAB — HEPATIC FUNCTION PANEL
ALBUMIN: 4.6 g/dL (ref 3.5–5.2)
ALK PHOS: 71 U/L (ref 39–117)
ALT: 33 U/L (ref 0–53)
AST: 17 U/L (ref 0–37)
Bilirubin, Direct: 0.1 mg/dL (ref 0.0–0.3)
TOTAL PROTEIN: 7.2 g/dL (ref 6.0–8.3)
Total Bilirubin: 0.5 mg/dL (ref 0.2–1.2)

## 2017-11-24 LAB — LIPID PANEL
CHOL/HDL RATIO: 4
Cholesterol: 141 mg/dL (ref 0–200)
HDL: 38.2 mg/dL — ABNORMAL LOW (ref >39.00)
LDL Cholesterol: 72 mg/dL (ref 0–99)
NONHDL: 102.65
Triglycerides: 155 mg/dL — ABNORMAL HIGH (ref 0.0–149.0)
VLDL: 31 mg/dL (ref 0.0–40.0)

## 2017-11-24 LAB — TESTOSTERONE: TESTOSTERONE: 360.55 ng/dL (ref 300.00–890.00)

## 2017-11-24 LAB — TSH: TSH: 2.58 u[IU]/mL (ref 0.35–4.50)

## 2017-11-24 LAB — PSA: PSA: 0.66 ng/mL (ref 0.10–4.00)

## 2017-11-24 MED ORDER — HYDROCODONE-ACETAMINOPHEN 10-325 MG PO TABS: ORAL_TABLET | ORAL | 0 refills | Status: DC

## 2017-11-24 MED ORDER — SACCHAROMYCES BOULARDII 250 MG PO CAPS: 250.0000 mg | ORAL_CAPSULE | Freq: Two times a day (BID) | ORAL | 3 refills | Status: DC

## 2017-11-24 MED ORDER — AMPHETAMINE-DEXTROAMPHETAMINE 10 MG PO TABS: 10.0000 mg | ORAL_TABLET | Freq: Two times a day (BID) | ORAL | 0 refills | Status: DC

## 2017-11-24 MED ORDER — DOXYCYCLINE HYCLATE 100 MG PO TABS: 100.0000 mg | ORAL_TABLET | Freq: Two times a day (BID) | ORAL | 0 refills | Status: DC

## 2017-11-24 MED FILL — DOXYCYCLINE HYCLATE 100 MG: 100 | 14 days supply | Qty: 28 | Fill #0

## 2017-11-24 MED FILL — HYDROCODON-APAP 10-325: 10-325 | 30 days supply | Qty: 120 | Fill #0

## 2017-11-24 MED FILL — AMPHETAMINE-DEXTROAMPHETAMI: 10 | 30 days supply | Qty: 60 | Fill #0

## 2017-11-24 NOTE — Assessment & Plan Note (Signed)
Doxy x 2 weeks

## 2017-11-24 NOTE — Progress Notes (Signed)
Subjective:  Patient ID: Mark Mcgee, male    DOB: January 01, 1966  Age: 52 y.o. MRN: [id number]  CC: No chief complaint on file.   HPI Mark Mcgee presents for ADD, LBP, IBS f/u C/o pelvic dull ache x2 months; h/o prostatitis Well exam  Outpatient Medications Prior to Visit  Medication Sig Dispense Refill  . acyclovir (ZOVIRAX) 400 MG tablet TAKE 1 TABLET BY MOUTH 4 TIMES DAILY 120 tablet 1  . alprazolam (XANAX) 2 MG tablet Take 1 tablet (2 mg total) by mouth 3 (three) times daily. 90 tablet 1  . amLODipine-olmesartan (AZOR) 10-40 MG tablet Take 1 tablet by mouth daily. 90 tablet 3  . amphetamine-dextroamphetamine (ADDERALL) 10 MG tablet Take 1 tablet (10 mg total) by mouth 2 (two) times daily with breakfast and lunch. 60 tablet 0  . b complex vitamins tablet Take 1 tablet by mouth daily.      . Cholecalciferol 1000 UNITS tablet Take 2,000 Units by mouth daily.      . diphenoxylate-atropine (LOMOTIL) 2.5-0.025 MG tablet TAKE 1 OR 2 TABLETS BY MOUTH 4 TIMES DAILY AS NEEDED FOR DIARRHEA OR LOOSE STOOLS MAX OF 8 TABLETS PER DAY 60 tablet 3  . Eluxadoline (VIBERZI) 100 MG TABS Take 1 tablet (100 mg total) by mouth 2 (two) times daily. 180 tablet 1  . Flaxseed, Linseed, (FLAX SEEDS) POWD Take by mouth 2 (two) times daily.      [name]. glycopyrrolate (ROBINUL) 2 MG tablet Take 1 tablet (2 mg total) by mouth 2 (two) times daily. 180 tablet 0  . HYDROcodone-acetaminophen (NORCO) 10-325 MG tablet TAKE 1 TABLET BY MOUTH EVERY 6 HOURS AS NEEDED FOR PAIN 120 tablet 0  . hyoscyamine (LEVSIN, ANASPAZ) 0.125 MG tablet TAKE 1 TO 2 TABLETS BY MOUTH EVERY 4 HOURS AS NEEDED 180 tablet 1  . ibuprofen (ADVIL,MOTRIN) 400 MG tablet TAKE 1 TABLET BY MOUTH TWICE A DAY AS NEEDED 60 tablet 2  . ipratropium (ATROVENT) 0.03 % nasal spray Place 2 sprays into both nostrils 2 (two) times daily. 30 mL 5  . Multiple Vitamin (MULTIVITAMIN) capsule Take by mouth.    . ondansetron (ZOFRAN) 4 MG tablet Take 1 tablet (4 mg  total) by mouth every 8 (eight) hours as needed for nausea or vomiting. 30 tablet 0  . tadalafil (CIALIS) 5 MG tablet Take 1 tablet (5 mg total) by mouth daily. 90 tablet 2  . testosterone cypionate (DEPOTESTOSTERONE CYPIONATE) 200 MG/ML injection Inject 1 mL (200 mg total) into the muscle every 14 (fourteen) days. 10 mL 5  . TRUVADA 200-300 MG tablet TAKE 1 TABLET BY MOUTH DAILY. 90 tablet 1  . Vitamin D, Ergocalciferol, (DRISDOL) 50000 units CAPS capsule Take 1 capsule (50,000 Units total) by mouth once a week. 8 capsule 0   No facility-administered medications prior to visit.     ROS Review of Systems  Constitutional: Positive for fatigue. Negative for appetite change and unexpected weight change.  HENT: Negative for congestion, nosebleeds, sneezing, sore throat and trouble swallowing.   Eyes: Negative for itching and visual disturbance.  Respiratory: Negative for cough.   Cardiovascular: Negative for chest pain, palpitations and leg swelling.  Gastrointestinal: Positive for abdominal distention, abdominal pain and diarrhea. Negative for blood in stool and nausea.  Genitourinary: Positive for frequency and urgency. Negative for hematuria and testicular pain.  Musculoskeletal: Positive for back pain. Negative for gait problem, joint swelling and neck pain.  Skin: Negative for rash.  Neurological: Negative for dizziness, tremors,  speech difficulty and weakness.  Psychiatric/Behavioral: Negative for agitation, dysphoric mood and sleep disturbance. The patient is not nervous/anxious.     Objective:  BP 108/64 (BP Location: Left Arm, Patient Position: Sitting, Cuff Size: Large)   Pulse 64   Temp 98.3 F (36.8 C) (Oral)   Ht 5\' 7"  (1.702 m)   Wt 205 lb (93 kg)   SpO2 98%   BMI 32.11 kg/m   BP Readings from Last 3 Encounters:  11/24/17 108/64  08/24/17 112/68  06/14/17 104/70    Wt Readings from Last 3 Encounters:  11/24/17 205 lb (93 kg)  08/24/17 203 lb (92.1 kg)  06/14/17  191 lb (86.6 kg)    Physical Exam  Constitutional: He is oriented to person, place, and time. He appears well-developed. No distress.  NAD  HENT:  Mouth/Throat: Oropharynx is clear and moist.  Eyes: Conjunctivae are normal. Pupils are equal, round, and reactive to light.  Neck: Normal range of motion. No JVD present. No thyromegaly present.  Cardiovascular: Normal rate, regular rhythm, normal heart sounds and intact distal pulses. Exam reveals no gallop and no friction rub.  No murmur heard. Pulmonary/Chest: Effort normal and breath sounds normal. No respiratory distress. He has no wheezes. He has no rales. He exhibits no tenderness.  Abdominal: Soft. Bowel sounds are normal. He exhibits no distension and no mass. There is no tenderness. There is no rebound and no guarding.  Musculoskeletal: Normal range of motion. He exhibits no edema or tenderness.  Lymphadenopathy:    He has no cervical adenopathy.  Neurological: He is alert and oriented to person, place, and time. He has normal reflexes. No cranial nerve deficit. He exhibits normal muscle tone. He displays a negative Romberg sign. Coordination and gait normal.  Skin: Skin is warm and dry. No rash noted.  Psychiatric: He has a normal mood and affect. His behavior is normal. Judgment and thought content normal.   Declined rectal  Lab Results  Component Value Date   WBC 5.7 05/18/2017   HGB 13.4 05/18/2017   HCT 41.9 05/18/2017   PLT 264.0 05/18/2017   GLUCOSE 101 (H) 05/18/2017   CHOL 166 06/06/2015   TRIG 60.0 06/06/2015   HDL 73.70 06/06/2015   LDLDIRECT 127.1 08/30/2012   LDLCALC 80 06/06/2015   ALT 24 08/24/2017   AST 15 08/24/2017   NA 137 05/18/2017   K 4.4 05/18/2017   CL 104 05/18/2017   CREATININE 1.18 05/18/2017   BUN 16 05/18/2017   CO2 27 05/18/2017   TSH 1.06 12/19/2015   PSA 0.60 06/06/2015    Ct Abdomen Pelvis Wo Contrast  Result Date: 03/29/2017 CLINICAL DATA:  52 year old male with left abdominal  pain and vomiting for 1 week. EXAM: CT ABDOMEN AND PELVIS WITHOUT CONTRAST TECHNIQUE: Multidetector CT imaging of the abdomen and pelvis was performed following the standard protocol without IV contrast. COMPARISON:  11/27/2010 and prior CTs FINDINGS: Please note that parenchymal abnormalities may be missed without intravenous contrast. Lower chest: No acute abnormality Hepatobiliary: The liver and gallbladder are unremarkable. No biliary dilatation. Pancreas: Unremarkable Spleen: Unremarkable Adrenals/Urinary Tract: The kidneys, adrenal glands and bladder are unremarkable. Stomach/Bowel: Stomach is within normal limits. Appendix appears normal. No evidence of bowel wall thickening, distention, or inflammatory changes. New A large amount of stool in the rectum is noted. Vascular/Lymphatic: No significant vascular findings are present. No enlarged abdominal or pelvic lymph nodes. Reproductive: Prostate is unremarkable. Other: No abdominal wall hernia or abnormality. No abdominopelvic ascites. Musculoskeletal:  No acute or significant osseous findings. IMPRESSION: No acute abnormality. Large amount of rectal stool without other significant abnormality. Electronically Signed   By: Harmon Pier M.D.   On: 03/29/2017 14:28    Assessment & Plan:   There are no diagnoses linked to this encounter. I am having Mark Mcgee maintain his b complex vitamins, Flax Seeds, Cholecalciferol, multivitamin, amLODipine-olmesartan, diphenoxylate-atropine, glycopyrrolate, hyoscyamine, ipratropium, testosterone cypionate, ondansetron, acyclovir, tadalafil, amphetamine-dextroamphetamine, HYDROcodone-acetaminophen, Eluxadoline, Vitamin D (Ergocalciferol), alprazolam, ibuprofen, and TRUVADA.  No orders of the defined types were placed in this encounter.    Follow-up: No Follow-up on file.  Sonda Primes, MD

## 2017-11-24 NOTE — Assessment & Plan Note (Signed)
Norco prn  Potential benefits of a long term opioids use as well as potential risks (i.e. addiction risk, apnea etc) and complications (i.e. Somnolence, constipation and others) were explained to the patient and were aknowledged. 

## 2017-11-26 LAB — RPR: RPR: NONREACTIVE

## 2017-11-26 LAB — HIV-1 RNA QUANT-NO REFLEX-BLD
HIV 1 RNA QUANT: NOT DETECTED {copies}/mL
HIV-1 RNA Quant, Log: <1.3 Log copies/mL

## 2017-11-26 LAB — GC/CHLAMYDIA PROBE AMP
CHLAMYDIA, DNA PROBE: NEGATIVE
Neisseria gonorrhoeae by PCR: NEGATIVE

## 2017-11-26 LAB — HIV ANTIBODY (ROUTINE TESTING W REFLEX): HIV 1&2 Ab, 4th Generation: NONREACTIVE

## 2017-11-27 ENCOUNTER — Encounter: Payer: Self-pay | Admitting: Internal Medicine

## 2017-11-27 NOTE — Assessment & Plan Note (Signed)
adderall Rx Risks discussed

## 2017-11-27 NOTE — Assessment & Plan Note (Signed)
We discussed age appropriate health related issues, including available/recomended screening tests and vaccinations. We discussed a need for adhering to healthy diet and exercise. Labs were ordered to be later reviewed . All questions were answered.   

## 2017-11-27 NOTE — Assessment & Plan Note (Signed)
Xanax prn  Potential benefits of a long term benzodiazepines  use as well as potential risks  and complications were explained to the patient and were aknowledged. 

## 2017-11-27 NOTE — Assessment & Plan Note (Signed)
Mark Mcgee is an option

## 2017-11-27 NOTE — Assessment & Plan Note (Signed)
[facility]

## 2017-11-30 ENCOUNTER — Encounter: Payer: Self-pay | Admitting: Internal Medicine

## 2017-12-02 ENCOUNTER — Other Ambulatory Visit: Payer: Self-pay | Admitting: Internal Medicine

## 2017-12-02 DIAGNOSIS — K580 Irritable bowel syndrome with diarrhea: Secondary | ICD-10-CM

## 2017-12-02 MED ORDER — RIFAXIMIN 550 MG PO TABS: 550.0000 mg | ORAL_TABLET | Freq: Three times a day (TID) | ORAL | 0 refills | Status: DC

## 2017-12-02 NOTE — Assessment & Plan Note (Signed)
Xifaxan x 2 wks

## 2017-12-22 ENCOUNTER — Other Ambulatory Visit: Payer: Self-pay

## 2017-12-22 MED ORDER — RIFAXIMIN 550 MG PO TABS: 550.0000 mg | ORAL_TABLET | Freq: Three times a day (TID) | ORAL | 0 refills | Status: DC

## 2017-12-22 MED FILL — IBUPROFEN 400 MG TABS: 400 | 30 days supply | Qty: 60 | Fill #1

## 2017-12-22 MED FILL — TRUVADA 200-300 MG TABS: 200-300 | 30 days supply | Qty: 30 | Fill #1

## 2017-12-22 MED FILL — DEXTROAMP-AMP 10 MG TAB: 10 | 30 days supply | Qty: 60 | Fill #0

## 2017-12-22 MED FILL — HYDROCODON-APAP 10-325: 10-325 | 30 days supply | Qty: 120 | Fill #0

## 2017-12-22 MED FILL — AMLODIPINE-OLMESARTAN 10-40: 10-40 | 30 days supply | Qty: 30 | Fill #5

## 2017-12-22 MED FILL — ALPRAZolam 2 MG TABS: 2 | 30 days supply | Qty: 90 | Fill #1

## 2018-01-10 ENCOUNTER — Telehealth: Payer: Self-pay

## 2018-01-10 NOTE — Telephone Encounter (Signed)
Key: [id number]  Prior auth started today via Cover My Meds. Currently pending approval.

## 2018-01-11 NOTE — Telephone Encounter (Addendum)
  Mark Mcgee  From cover my meds Wants to know if the office received fax that was sent 4/9  reguarding the PA  For xifaxan.  Her contact number is [phone]     Ref key [id number]

## 2018-01-16 ENCOUNTER — Other Ambulatory Visit: Payer: Self-pay | Admitting: Internal Medicine

## 2018-01-16 ENCOUNTER — Encounter: Payer: Self-pay | Admitting: Internal Medicine

## 2018-01-16 MED FILL — TADALAFIL 5 MG TABS: 5 | 90 days supply | Qty: 90 | Fill #2

## 2018-01-17 ENCOUNTER — Telehealth: Payer: Self-pay

## 2018-01-17 MED FILL — AMLODIPINE-OLMESARTAN 10-40: 10-40 | 30 days supply | Qty: 30 | Fill #6

## 2018-01-17 MED FILL — IBUPROFEN 400 MG TABS: 400 | 30 days supply | Qty: 60 | Fill #2

## 2018-01-17 MED FILL — TRUVADA 200-300 MG TABS: 200-300 | 30 days supply | Qty: 30 | Fill #2

## 2018-01-17 NOTE — Telephone Encounter (Signed)
Message from Plan:  This request has not been approved. Based on the information submitted for review, you did not meet our guideline rules for the requested drug. In order for your request to be approved, your provider would need to show that you have met the guideline rules below. For the treatment of irritable bowel syndrome with diarrhea (condition of stomach pain with many periods of diarrhea), our guideline named RIFAXIMIN '550mg'$  (the generic name for Xifaxan) requires that you meet the following criteria: 1) You have had a trial of or contraindication to tricyclic anti-depressants (e.g., amitriptyline, nortriptyline) or dicyclomine. 2) The medication is prescribed by or in consultation with a gastroenterologist. This request has been denied because this medication was not prescribed by a gastroenterologist. Please work with your doctor to use a different medication or get [place] more information if it will allow [place] to approve this request. A written notification letter will follow with additional details.

## 2018-01-19 MED FILL — DEXTROAMP-AMP 10 MG TAB: 10 | 30 days supply | Qty: 60 | Fill #0

## 2018-01-19 MED FILL — HYDROCODON-APAP 10-325: 10-325 | 30 days supply | Qty: 120 | Fill #0

## 2018-01-27 ENCOUNTER — Telehealth: Payer: Self-pay

## 2018-01-27 NOTE — Telephone Encounter (Signed)
See other TE.

## 2018-01-27 NOTE — Telephone Encounter (Signed)
Key: [facility]

## 2018-02-21 ENCOUNTER — Other Ambulatory Visit: Payer: Self-pay | Admitting: Internal Medicine

## 2018-02-21 ENCOUNTER — Encounter: Payer: Self-pay | Admitting: Internal Medicine

## 2018-02-21 MED ORDER — HYDROCODONE-ACETAMINOPHEN 10-325 MG PO TABS: ORAL_TABLET | ORAL | 0 refills | Status: DC

## 2018-02-21 MED ORDER — AMPHETAMINE-DEXTROAMPHETAMINE 10 MG PO TABS: 10.0000 mg | ORAL_TABLET | Freq: Two times a day (BID) | ORAL | 0 refills | Status: DC

## 2018-02-21 MED ORDER — DOXYCYCLINE HYCLATE 100 MG PO TABS: 100.0000 mg | ORAL_TABLET | Freq: Two times a day (BID) | ORAL | 0 refills | Status: DC

## 2018-02-21 MED FILL — DOXYCYCLINE HYCLATE 100 MG: 100 | 14 days supply | Qty: 28 | Fill #0

## 2018-02-21 NOTE — Telephone Encounter (Signed)
Medications:   Dr Posey Rea.   I was hoping you could write me two prescriptions for my hydrocodone , Adderrall and doxycycline that I could pick up today. I cannot find the last two you wrote for this month. I believe I shed them with my AVS. And the doxy because I am still having the same symptoms they really did not go away. I do have an appointment scheduled with you in early July. Thank you. [name]

## 2018-02-22 ENCOUNTER — Other Ambulatory Visit: Payer: Self-pay | Admitting: Internal Medicine

## 2018-02-22 ENCOUNTER — Ambulatory Visit: Payer: 59 | Admitting: Internal Medicine

## 2018-02-22 MED FILL — HYDROCODON-APAP 10-325: 10-325 | 30 days supply | Qty: 120 | Fill #0

## 2018-02-22 MED FILL — TRUVADA 200-300 MG TABS: 200-300 | 30 days supply | Qty: 30 | Fill #3

## 2018-02-22 MED FILL — ALPRAZolam 2 MG TABS: 2 | 30 days supply | Qty: 90 | Fill #0

## 2018-02-22 MED FILL — DEXTROAMP-AMP 10 MG TAB: 10 | 30 days supply | Qty: 60 | Fill #0

## 2018-02-22 MED FILL — AMLODIPINE-OLMESARTAN 10-40: 10-40 | 90 days supply | Qty: 90 | Fill #0

## 2018-03-03 ENCOUNTER — Ambulatory Visit: Payer: 59 | Admitting: Family

## 2018-03-03 ENCOUNTER — Encounter: Payer: Self-pay | Admitting: Internal Medicine

## 2018-03-03 ENCOUNTER — Ambulatory Visit: Payer: 59 | Admitting: Internal Medicine

## 2018-03-03 DIAGNOSIS — G8929 Other chronic pain: Secondary | ICD-10-CM

## 2018-03-03 DIAGNOSIS — F411 Generalized anxiety disorder: Secondary | ICD-10-CM

## 2018-03-03 DIAGNOSIS — M352 Behcet's disease: Secondary | ICD-10-CM

## 2018-03-03 DIAGNOSIS — M5440 Lumbago with sciatica, unspecified side: Secondary | ICD-10-CM

## 2018-03-03 MED ORDER — HYDROCODONE-ACETAMINOPHEN 10-325 MG PO TABS: ORAL_TABLET | ORAL | 0 refills | Status: DC

## 2018-03-03 MED ORDER — PREDNISONE 10 MG PO TABS: ORAL_TABLET | ORAL | 0 refills | Status: DC

## 2018-03-03 MED ORDER — HALOBETASOL PROPIONATE 0.05 % EX OINT: TOPICAL_OINTMENT | Freq: Two times a day (BID) | CUTANEOUS | 3 refills | Status: DC

## 2018-03-03 MED ORDER — TRIAMCINOLONE ACETONIDE 0.1 % MT PSTE: 1.0000 "application " | PASTE | Freq: Three times a day (TID) | OROMUCOSAL | 3 refills | Status: AC

## 2018-03-03 MED ORDER — AMPHETAMINE-DEXTROAMPHETAMINE 10 MG PO TABS: 10.0000 mg | ORAL_TABLET | Freq: Two times a day (BID) | ORAL | 0 refills | Status: DC

## 2018-03-03 MED ORDER — ACYCLOVIR 400 MG PO TABS: ORAL_TABLET | ORAL | 1 refills | Status: DC

## 2018-03-03 MED FILL — HALOBETASOL PROPIONATE 0.05: 0.05 | 30 days supply | Qty: 50 | Fill #0

## 2018-03-03 MED FILL — ACYCLOVIR 400 MG TABLET: 400 | 30 days supply | Qty: 120 | Fill #0

## 2018-03-03 MED FILL — FLORASTOR 250 MG CAPSULE: 250 | 30 days supply | Qty: 60 | Fill #0

## 2018-03-03 MED FILL — TRIAMCINOLONE ACETONIDE 0.1: 0.1 | 7 days supply | Qty: 5 | Fill #0

## 2018-03-03 MED FILL — predniSONE 10 MG TABS: 10 | 17 days supply | Qty: 38 | Fill #0

## 2018-03-03 NOTE — Progress Notes (Signed)
Subjective:  Patient ID: Mark Mcgee, male    DOB: [birth year]  Age: 52 y.o. MRN: [id number]  CC: No chief complaint on file.   HPI Mark Mcgee presents for IBS, LBP, ADD and depression f/u C/o mouth sores  Outpatient Medications Prior to Visit  Medication Sig Dispense Refill  . acyclovir (ZOVIRAX) 400 MG tablet TAKE 1 TABLET BY MOUTH 4 TIMES DAILY 120 tablet 1  . alprazolam (XANAX) 2 MG tablet TAKE 1 TABLET BY MOUTH 3 TIMES DAILY 90 tablet 1  . amLODipine-olmesartan (AZOR) 10-40 MG tablet TAKE 1 TABLET BY MOUTH ONCE DAILY 90 tablet 3  . amphetamine-dextroamphetamine (ADDERALL) 10 MG tablet Take 1 tablet (10 mg total) by mouth 2 (two) times daily with breakfast and lunch. 60 tablet 0  . b complex vitamins tablet Take 1 tablet by mouth daily.      . Cholecalciferol 1000 UNITS tablet Take 2,000 Units by mouth daily.      . diphenoxylate-atropine (LOMOTIL) 2.5-0.025 MG tablet TAKE 1 OR 2 TABLETS BY MOUTH 4 TIMES DAILY AS NEEDED FOR DIARRHEA OR LOOSE STOOLS MAX OF 8 TABLETS PER DAY 60 tablet 3  . doxycycline (VIBRA-TABS) 100 MG tablet Take 1 tablet (100 mg total) by mouth 2 (two) times daily. 28 tablet 0  . Eluxadoline (VIBERZI) 100 MG TABS Take 1 tablet (100 mg total) by mouth 2 (two) times daily. 180 tablet 1  . Flaxseed, Linseed, (FLAX SEEDS) POWD Take by mouth 2 (two) times daily.      [name]. glycopyrrolate (ROBINUL) 2 MG tablet Take 1 tablet (2 mg total) by mouth 2 (two) times daily. 180 tablet 0  . HYDROcodone-acetaminophen (NORCO) 10-325 MG tablet TAKE 1 TABLET BY MOUTH EVERY 6 HOURS AS NEEDED FOR PAIN 120 tablet 0  . hyoscyamine (LEVSIN, ANASPAZ) 0.125 MG tablet TAKE 1 TO 2 TABLETS BY MOUTH EVERY 4 HOURS AS NEEDED 180 tablet 1  . ibuprofen (ADVIL,MOTRIN) 400 MG tablet TAKE 1 TABLET BY MOUTH TWICE A DAY AS NEEDED 60 tablet 2  . ipratropium (ATROVENT) 0.03 % nasal spray Place 2 sprays into both nostrils 2 (two) times daily. 30 mL 5  . Multiple Vitamin (MULTIVITAMIN) capsule Take by  mouth.    . ondansetron (ZOFRAN) 4 MG tablet Take 1 tablet (4 mg total) by mouth every 8 (eight) hours as needed for nausea or vomiting. 30 tablet 0  . rifaximin (XIFAXAN) 550 MG TABS tablet Take 1 tablet (550 mg total) by mouth 3 (three) times daily. For IBS 42 tablet 0  . saccharomyces boulardii (FLORASTOR) 250 MG capsule Take 1 capsule (250 mg total) by mouth 2 (two) times daily. 60 capsule 3  . tadalafil (CIALIS) 5 MG tablet Take 1 tablet (5 mg total) by mouth daily. 90 tablet 2  . testosterone cypionate (DEPOTESTOSTERONE CYPIONATE) 200 MG/ML injection Inject 1 mL (200 mg total) into the muscle every 14 (fourteen) days. 10 mL 5  . TRUVADA 200-300 MG tablet TAKE 1 TABLET BY MOUTH DAILY. 90 tablet 1  . Vitamin D, Ergocalciferol, (DRISDOL) 50000 units CAPS capsule Take 1 capsule (50,000 Units total) by mouth once a week. 8 capsule 0   No facility-administered medications prior to visit.     ROS: Review of Systems  Constitutional: Positive for fatigue. Negative for appetite change and unexpected weight change.  HENT: Negative for congestion, nosebleeds, sneezing, sore throat and trouble swallowing.   Eyes: Negative for itching and visual disturbance.  Respiratory: Negative for cough.   Cardiovascular: Negative for  chest pain, palpitations and leg swelling.  Gastrointestinal: Positive for abdominal distention, abdominal pain and diarrhea. Negative for blood in stool and nausea.  Genitourinary: Negative for frequency and hematuria.  Musculoskeletal: Positive for arthralgias and back pain. Negative for gait problem, joint swelling and neck pain.  Skin: Negative for rash.  Neurological: Negative for dizziness, tremors, speech difficulty and weakness.  Psychiatric/Behavioral: Positive for dysphoric mood. Negative for agitation, sleep disturbance and suicidal ideas. The patient is nervous/anxious.     Objective:  BP 110/64 (BP Location: Left Arm, Patient Position: Sitting, Cuff Size: Large)    Pulse 61   Temp 98.2 F (36.8 C) (Oral)   Ht 5\' 7"  (1.702 m)   Wt 205 lb (93 kg)   SpO2 99%   BMI 32.11 kg/m   BP Readings from Last 3 Encounters:  03/03/18 110/64  11/24/17 108/64  08/24/17 112/68    Wt Readings from Last 3 Encounters:  03/03/18 205 lb (93 kg)  11/24/17 205 lb (93 kg)  08/24/17 203 lb (92.1 kg)    Physical Exam  Constitutional: He is oriented to person, place, and time. He appears well-developed. No distress.  NAD  HENT:  Mouth/Throat: Oropharynx is clear and moist.  Eyes: Pupils are equal, round, and reactive to light. Conjunctivae are normal.  Neck: Normal range of motion. No JVD present. No thyromegaly present.  Cardiovascular: Normal rate, regular rhythm, normal heart sounds and intact distal pulses. Exam reveals no gallop and no friction rub.  No murmur heard. Pulmonary/Chest: Effort normal and breath sounds normal. No respiratory distress. He has no wheezes. He has no rales. He exhibits no tenderness.  Abdominal: Soft. Bowel sounds are normal. He exhibits no distension and no mass. There is no tenderness. There is no rebound and no guarding.  Musculoskeletal: Normal range of motion. He exhibits no edema or tenderness.  Lymphadenopathy:    He has no cervical adenopathy.  Neurological: He is alert and oriented to person, place, and time. He has normal reflexes. No cranial nerve deficit. He exhibits normal muscle tone. He displays a negative Romberg sign. Coordination and gait normal.  Skin: Skin is warm and dry. No rash noted.  Psychiatric: He has a normal mood and affect. His behavior is normal. Judgment and thought content normal.  penile 2x3 cm ulcer R mouth angle erosion, tongue erosion sad  Lab Results  Component Value Date   WBC 5.1 11/24/2017   HGB 13.2 11/24/2017   HCT 41.1 11/24/2017   PLT 265.0 11/24/2017   GLUCOSE 97 11/24/2017   CHOL 141 11/24/2017   TRIG 155.0 (H) 11/24/2017   HDL 38.20 (L) 11/24/2017   LDLDIRECT 127.1 08/30/2012     LDLCALC 72 11/24/2017   ALT 33 11/24/2017   AST 17 11/24/2017   NA 142 11/24/2017   K 4.9 11/24/2017   CL 104 11/24/2017   CREATININE 1.21 11/24/2017   BUN 16 11/24/2017   CO2 30 11/24/2017   TSH 2.58 11/24/2017   PSA 0.66 11/24/2017    Ct Abdomen Pelvis Wo Contrast  Result Date: 03/29/2017 CLINICAL DATA:  52 year old male with left abdominal pain and vomiting for 1 week. EXAM: CT ABDOMEN AND PELVIS WITHOUT CONTRAST TECHNIQUE: Multidetector CT imaging of the abdomen and pelvis was performed following the standard protocol without IV contrast. COMPARISON:  11/27/2010 and prior CTs FINDINGS: Please note that parenchymal abnormalities may be missed without intravenous contrast. Lower chest: No acute abnormality Hepatobiliary: The liver and gallbladder are unremarkable. No biliary dilatation. Pancreas: Unremarkable  Spleen: Unremarkable Adrenals/Urinary Tract: The kidneys, adrenal glands and bladder are unremarkable. Stomach/Bowel: Stomach is within normal limits. Appendix appears normal. No evidence of bowel wall thickening, distention, or inflammatory changes. New A large amount of stool in the rectum is noted. Vascular/Lymphatic: No significant vascular findings are present. No enlarged abdominal or pelvic lymph nodes. Reproductive: Prostate is unremarkable. Other: No abdominal wall hernia or abnormality. No abdominopelvic ascites. Musculoskeletal: No acute or significant osseous findings. IMPRESSION: No acute abnormality. Large amount of rectal stool without other significant abnormality. Electronically Signed   By: Harmon Pier M.D.   On: 03/29/2017 14:28    Assessment & Plan:   There are no diagnoses linked to this encounter.   No orders of the defined types were placed in this encounter.    Follow-up: No follow-ups on file.  Sonda Primes, MD

## 2018-03-03 NOTE — Assessment & Plan Note (Addendum)
[facility] - Rheum ref offered Prednisone 10 mg: take 4 tabs a day x 3 days; then 3 tabs a day x 4 days; then 2 tabs a day x 4 days, then 1 tab a day x 6 days, then stop. Take pc. Ultravate Triamc paste Acyclovir - empiric

## 2018-03-03 NOTE — Assessment & Plan Note (Signed)
norco prn  Potential benefits of a long term opioids use as well as potential risks (i.e. addiction risk, apnea etc) and complications (i.e. Somnolence, constipation and others) were explained to the patient and were aknowledged. 

## 2018-03-03 NOTE — Assessment & Plan Note (Signed)
Xanax prn 

## 2018-03-06 DIAGNOSIS — H5213 Myopia, bilateral: Secondary | ICD-10-CM | POA: Diagnosis not present

## 2018-03-08 ENCOUNTER — Encounter: Payer: Self-pay | Admitting: Internal Medicine

## 2018-03-21 MED FILL — TRUVADA 200-300 MG TABS: 200-300 | 30 days supply | Qty: 30 | Fill #4

## 2018-03-21 MED FILL — ALPRAZolam 2 MG TABS: 2 | 30 days supply | Qty: 90 | Fill #1

## 2018-03-21 MED FILL — TRIAMCINOLONE ACETONIDE 0.1: 0.1 | 7 days supply | Qty: 5 | Fill #1

## 2018-03-23 MED FILL — DEXTROAMP-AMP 10 MG TAB: 10 | 30 days supply | Qty: 60 | Fill #0

## 2018-03-23 MED FILL — HYDROCODON-APAP 10-325: 10-325 | 30 days supply | Qty: 120 | Fill #0

## 2018-03-29 ENCOUNTER — Telehealth: Payer: Self-pay | Admitting: Pharmacist

## 2018-03-29 NOTE — Telephone Encounter (Signed)
Called patient to schedule an appointment for the [facility]. I was unable to reach the patient so I left a HIPAA-compliant message requesting that the patient return my call.   

## 2018-04-04 ENCOUNTER — Encounter (INDEPENDENT_AMBULATORY_CARE_PROVIDER_SITE_OTHER): Payer: Self-pay

## 2018-04-20 ENCOUNTER — Other Ambulatory Visit: Payer: Self-pay | Admitting: Internal Medicine

## 2018-04-20 MED FILL — AMPHETAMINE-DEXTROAMPHETAMI: 10 | 30 days supply | Qty: 60 | Fill #0

## 2018-04-21 ENCOUNTER — Telehealth: Payer: Self-pay | Admitting: Internal Medicine

## 2018-04-21 MED FILL — ALPRAZolam 2 MG TABS: 2 | 4 days supply | Qty: 12 | Fill #0

## 2018-04-21 MED FILL — HYDROCODON-APAP 10-325: 10-325 | 30 days supply | Qty: 120 | Fill #0

## 2018-04-21 NOTE — Telephone Encounter (Signed)
Daphne calling from [facility]. States  Dr. Posey ReaPlotnikov sent over a Rx for HYDROcodone-acetaminophen (NORCO) 10-325 MG tablet and it has to be filled on 04/23/18. They are closed on Sunday. Questioning  if med can be filled today.  CB# [phone]

## 2018-04-21 NOTE — Telephone Encounter (Signed)
Checked controlled substance database, last filled 03/01/18.

## 2018-04-21 NOTE — Telephone Encounter (Signed)
Dispensed 12 tablets. Further refill from PCP.   Myra RudeSchmitz, Jeremy E, MD [facility] 04/21/2018, 3:25 PM

## 2018-04-21 NOTE — Telephone Encounter (Signed)
Copied from CRM [id number]. Topic: Quick Communication - See Telephone Encounter >> Apr 21, 2018  9:52 AM Tamela OddiMartin, Don'Quashia, NT wrote: CRM for notification. See Telephone encounter for: 04/21/18. Daphne calling from [facility] states that Dr. Posey ReaPlotnikov sent over a Rx for HYDROcodone-acetaminophen (NORCO) 10-325 MG tablet and it has to be filled on 04/23/18. They are closed on Sunday. She is wondering can she fill it today. Please call CB# [phone]

## 2018-04-21 NOTE — Telephone Encounter (Signed)
Okay given to pharmacy 

## 2018-04-21 NOTE — Telephone Encounter (Signed)
Called patient to schedule an appointment for the [facility]. I was unable to reach the patient so I left a HIPAA-compliant message requesting that the patient return my call. Also provided my email.  Of note, patient has received my mychart message and is aware of the program per pharmacy.

## 2018-04-24 ENCOUNTER — Ambulatory Visit (INDEPENDENT_AMBULATORY_CARE_PROVIDER_SITE_OTHER): Payer: 59 | Admitting: Pharmacist

## 2018-04-24 DIAGNOSIS — Z79899 Other long term (current) drug therapy: Secondary | ICD-10-CM

## 2018-04-24 MED ORDER — TRUVADA 200-300 MG PO TABS: 1.0000 | ORAL_TABLET | Freq: Every day | ORAL | 0 refills | Status: DC

## 2018-04-24 MED FILL — TRUVADA 200-300 MG TABS: 200-300 | 30 days supply | Qty: 30 | Fill #0

## 2018-04-24 NOTE — Progress Notes (Signed)
   S: [facility].    Patient is currently taking Truvada for HIV PrEP therapy. Patient is managed by Dr. Posey ReaPlotnikov for this. He reports that he has been on it for about 2 years.  Adherence: denies any missed doses  Dosing: 1 tablet daily  Monitoring: HIV RNA: undetectable Hepatotoxicity: labs WNL Nephrotoxicity: labs WNl S/sx of decreased bone density: denies  Would like a list of medications that have gluten in them as he has a gluten intolerance.   O:     Lab Results  Component Value Date   WBC 5.1 11/24/2017   HGB 13.2 11/24/2017   HCT 41.1 11/24/2017   MCV 78.2 11/24/2017   PLT 265.0 11/24/2017      Chemistry      Component Value Date/Time   NA 142 11/24/2017 1048   K 4.9 11/24/2017 1048   CL 104 11/24/2017 1048   CO2 30 11/24/2017 1048   BUN 16 11/24/2017 1048   CREATININE 1.21 11/24/2017 1048      Component Value Date/Time   CALCIUM 10.0 11/24/2017 1048   ALKPHOS 71 11/24/2017 1048   AST 17 11/24/2017 1048   ALT 33 11/24/2017 1048   BILITOT 0.5 11/24/2017 1048       No results found for: CD4TCELL, CD4TABS  Lab Results  Component Value Date   HIV1RNAQUANT <20 NOT DETECTED 11/24/2017     A/P: 1. Medication review: patient is on Truvada for PrEP. Reviewed the medication with the patient, including the following: Atripla is a combination medication used for the treatment of HIV. Patient educated on purpose, proper use and potential adverse effects of Atripla. Common adverse effects including metabolic disturbances, neuropsychiatric symptoms, and skin reactions. Adherence is crucial when using this drug to avoid mutations and resistance. Looked up Truvada and package insert states the starch used is gluten free. Provided website with information about other gluten free medications or advised him to discuss with pharmacy and contact manufacturer. No recommendations for changes.     Alvino BloodStacey Karl Hammer, PharmD, BCPS, BCACP, CPP Clinical Pharmacist Practitioner  [phone]

## 2018-04-26 ENCOUNTER — Other Ambulatory Visit: Payer: Self-pay | Admitting: Family Medicine

## 2018-04-26 ENCOUNTER — Encounter: Payer: Self-pay | Admitting: Internal Medicine

## 2018-04-26 ENCOUNTER — Other Ambulatory Visit: Payer: Self-pay | Admitting: Internal Medicine

## 2018-04-26 ENCOUNTER — Telehealth: Payer: Self-pay

## 2018-04-26 NOTE — Telephone Encounter (Signed)
PA started on CoverMyMeds KEY: Key: [id number]

## 2018-04-26 NOTE — Telephone Encounter (Signed)
Please advise about RX 

## 2018-04-28 MED ORDER — RIFAXIMIN 550 MG PO TABS: 550.0000 mg | ORAL_TABLET | Freq: Three times a day (TID) | ORAL | 2 refills | Status: DC

## 2018-04-28 MED ORDER — ALPRAZOLAM 2 MG PO TABS: 2.0000 mg | ORAL_TABLET | Freq: Three times a day (TID) | ORAL | 1 refills | Status: DC

## 2018-05-02 NOTE — Telephone Encounter (Signed)
PA on Xifaxin denied, please advise

## 2018-05-03 ENCOUNTER — Other Ambulatory Visit: Payer: Self-pay | Admitting: Family Medicine

## 2018-05-03 ENCOUNTER — Other Ambulatory Visit: Payer: Self-pay | Admitting: Internal Medicine

## 2018-05-03 NOTE — Telephone Encounter (Signed)
Noted. Thx.

## 2018-05-15 MED FILL — TADALAFIL 5 MG TABS: 5 | 90 days supply | Qty: 90 | Fill #0

## 2018-05-15 MED FILL — AMLODIPINE-OLMESARTAN 10-40: 10-40 | 90 days supply | Qty: 90 | Fill #1

## 2018-06-02 ENCOUNTER — Encounter: Payer: Self-pay | Admitting: Internal Medicine

## 2018-06-02 ENCOUNTER — Ambulatory Visit: Payer: 59 | Admitting: Internal Medicine

## 2018-06-02 VITALS — BP 114/66 | HR 66 | Temp 97.7°F | Ht 67.0 in | Wt 210.0 lb

## 2018-06-02 DIAGNOSIS — M5440 Lumbago with sciatica, unspecified side: Secondary | ICD-10-CM | POA: Diagnosis not present

## 2018-06-02 DIAGNOSIS — K580 Irritable bowel syndrome with diarrhea: Secondary | ICD-10-CM | POA: Diagnosis not present

## 2018-06-02 DIAGNOSIS — I10 Essential (primary) hypertension: Secondary | ICD-10-CM | POA: Diagnosis not present

## 2018-06-02 DIAGNOSIS — G8929 Other chronic pain: Secondary | ICD-10-CM

## 2018-06-02 DIAGNOSIS — N410 Acute prostatitis: Secondary | ICD-10-CM | POA: Diagnosis not present

## 2018-06-02 DIAGNOSIS — F988 Other specified behavioral and emotional disorders with onset usually occurring in childhood and adolescence: Secondary | ICD-10-CM

## 2018-06-02 DIAGNOSIS — E291 Testicular hypofunction: Secondary | ICD-10-CM

## 2018-06-02 MED ORDER — AMPHETAMINE-DEXTROAMPHETAMINE 10 MG PO TABS: 10.0000 mg | ORAL_TABLET | Freq: Two times a day (BID) | ORAL | 0 refills | Status: DC

## 2018-06-02 MED ORDER — RIFAXIMIN 550 MG PO TABS: 550.0000 mg | ORAL_TABLET | Freq: Three times a day (TID) | ORAL | 2 refills | Status: DC

## 2018-06-02 MED ORDER — HYDROCODONE-ACETAMINOPHEN 10-325 MG PO TABS: ORAL_TABLET | ORAL | 0 refills | Status: DC

## 2018-06-02 MED FILL — HYDROCODON-APAP 10-325: 10-325 | 30 days supply | Qty: 120 | Fill #0

## 2018-06-02 MED FILL — AMPHETAMINE-DEXTROAMPHETAMI: 10 | 30 days supply | Qty: 60 | Fill #0

## 2018-06-02 NOTE — Assessment & Plan Note (Signed)
Potential benefits of a long term testosterone use as well as potential risks  and complications were explained to the patient and were aknowledged. On Tetosterone 

## 2018-06-02 NOTE — Assessment & Plan Note (Signed)
Azor 

## 2018-06-02 NOTE — Assessment & Plan Note (Signed)
Xifaxan - not approved w/PA. Will re-try. Nothing else is working

## 2018-06-02 NOTE — Progress Notes (Signed)
Subjective:  Patient ID: Mark Mcgee, male    DOB: 08-22-1966  Age: 52 y.o. MRN: [id number]  CC: No chief complaint on file.   HPI Mark Mcgee presents for IBS, HAs, HTN f/u. C/o stress - mom had a stroke  Outpatient Medications Prior to Visit  Medication Sig Dispense Refill  . acyclovir (ZOVIRAX) 400 MG tablet TAKE 1 TABLET BY MOUTH 4 TIMES DAILY 120 tablet 1  . alprazolam (XANAX) 2 MG tablet TAKE 1 TABLET BY MOUTH 3 TIMES DAILY 90 tablet 1  . amLODipine-olmesartan (AZOR) 10-40 MG tablet TAKE 1 TABLET BY MOUTH ONCE DAILY 90 tablet 3  . amphetamine-dextroamphetamine (ADDERALL) 10 MG tablet Take 1 tablet (10 mg total) by mouth 2 (two) times daily with breakfast and lunch. 60 tablet 0  . b complex vitamins tablet Take 1 tablet by mouth daily.      . Cholecalciferol 1000 UNITS tablet Take 2,000 Units by mouth daily.      . diphenoxylate-atropine (LOMOTIL) 2.5-0.025 MG tablet TAKE 1 OR 2 TABLETS BY MOUTH 4 TIMES DAILY AS NEEDED FOR DIARRHEA OR LOOSE STOOLS MAX OF 8 TABLETS PER DAY 60 tablet 3  . doxycycline (VIBRA-TABS) 100 MG tablet Take 1 tablet (100 mg total) by mouth 2 (two) times daily. 28 tablet 0  . Eluxadoline (VIBERZI) 100 MG TABS Take 1 tablet (100 mg total) by mouth 2 (two) times daily. 180 tablet 1  . Flaxseed, Linseed, (FLAX SEEDS) POWD Take by mouth 2 (two) times daily.      [name] glycopyrrolate (ROBINUL) 2 MG tablet Take 1 tablet (2 mg total) by mouth 2 (two) times daily. 180 tablet 0  . halobetasol (ULTRAVATE) 0.05 % ointment Apply topically 2 (two) times daily. 50 g 3  . HYDROcodone-acetaminophen (NORCO) 10-325 MG tablet TAKE 1 TABLET BY MOUTH EVERY 6 HOURS AS NEEDED FOR PAIN 120 tablet 0  . hyoscyamine (LEVSIN, ANASPAZ) 0.125 MG tablet TAKE 1 TO 2 TABLETS BY MOUTH EVERY 4 HOURS AS NEEDED 180 tablet 1  . ibuprofen (ADVIL,MOTRIN) 400 MG tablet TAKE 1 TABLET BY MOUTH TWICE A DAY AS NEEDED 60 tablet 2  . ipratropium (ATROVENT) 0.03 % nasal spray Place 2 sprays into both  nostrils 2 (two) times daily. 30 mL 5  . Multiple Vitamin (MULTIVITAMIN) capsule Take by mouth.    . ondansetron (ZOFRAN) 4 MG tablet Take 1 tablet (4 mg total) by mouth every 8 (eight) hours as needed for nausea or vomiting. 30 tablet 0  . predniSONE (DELTASONE) 10 MG tablet Prednisone 10 mg: take 4 tabs a day x 3 days; then 3 tabs a day x 4 days; then 2 tabs a day x 4 days, then 1 tab a day x 6 days, then stop. Take pc. 38 tablet 0  . rifaximin (XIFAXAN) 550 MG TABS tablet Take 1 tablet (550 mg total) by mouth 3 (three) times daily. For IBS flare-up: take for 14 days. May repeat the course 2 more times per year for flare-ups. 56 tablet 2  . saccharomyces boulardii (FLORASTOR) 250 MG capsule Take 1 capsule (250 mg total) by mouth 2 (two) times daily. 60 capsule 3  . tadalafil (CIALIS) 5 MG tablet TAKE 1 TABLET BY MOUTH ONCE DAILY 90 tablet 3  . testosterone cypionate (DEPOTESTOSTERONE CYPIONATE) 200 MG/ML injection Inject 1 mL (200 mg total) into the muscle every 14 (fourteen) days. 10 mL 5  . triamcinolone (KENALOG) 0.1 % paste Use as directed 1 application in the mouth or throat 3 (  three) times daily. 5 g 3  . TRUVADA 200-300 MG tablet Take 1 tablet by mouth daily. 30 tablet 0  . Vitamin D, Ergocalciferol, (DRISDOL) 50000 units CAPS capsule Take 1 capsule (50,000 Units total) by mouth once a week. 8 capsule 0  . alprazolam (XANAX) 2 MG tablet Take 1 tablet (2 mg total) by mouth 3 (three) times daily. 90 tablet 1   No facility-administered medications prior to visit.     ROS: Review of Systems  Constitutional: Positive for fatigue. Negative for appetite change and unexpected weight change.  HENT: Negative for congestion, nosebleeds, sneezing, sore throat and trouble swallowing.   Eyes: Negative for itching and visual disturbance.  Respiratory: Negative for cough.   Cardiovascular: Negative for chest pain, palpitations and leg swelling.  Gastrointestinal: Positive for abdominal distention,  abdominal pain, diarrhea and rectal pain. Negative for blood in stool and nausea.  Genitourinary: Negative for frequency and hematuria.  Musculoskeletal: Positive for arthralgias and back pain. Negative for gait problem, joint swelling and neck pain.  Skin: Negative for rash.  Neurological: Negative for dizziness, tremors, speech difficulty and weakness.  Psychiatric/Behavioral: Positive for decreased concentration. Negative for agitation, dysphoric mood, sleep disturbance and suicidal ideas. The patient is nervous/anxious.     Objective:  BP 114/66 (BP Location: Left Arm, Patient Position: Sitting, Cuff Size: Large)   Pulse 66   Temp 97.7 F (36.5 C) (Oral)   Ht 5\' 7"  (1.702 m)   Wt 210 lb (95.3 kg)   SpO2 97%   BMI 32.89 kg/m   BP Readings from Last 3 Encounters:  06/02/18 114/66  03/03/18 110/64  11/24/17 108/64    Wt Readings from Last 3 Encounters:  06/02/18 210 lb (95.3 kg)  03/03/18 205 lb (93 kg)  11/24/17 205 lb (93 kg)    Physical Exam  Constitutional: He is oriented to person, place, and time. He appears well-developed. No distress.  NAD  HENT:  Mouth/Throat: Oropharynx is clear and moist.  Eyes: Pupils are equal, round, and reactive to light. Conjunctivae are normal.  Neck: Normal range of motion. No JVD present. No thyromegaly present.  Cardiovascular: Normal rate, regular rhythm, normal heart sounds and intact distal pulses. Exam reveals no gallop and no friction rub.  No murmur heard. Pulmonary/Chest: Effort normal and breath sounds normal. No respiratory distress. He has no wheezes. He has no rales. He exhibits no tenderness.  Abdominal: Soft. Bowel sounds are normal. He exhibits no distension and no mass. There is no tenderness. There is no rebound and no guarding.  Musculoskeletal: Normal range of motion. He exhibits tenderness. He exhibits no edema.  Lymphadenopathy:    He has no cervical adenopathy.  Neurological: He is alert and oriented to person,  place, and time. He has normal reflexes. No cranial nerve deficit. He exhibits normal muscle tone. He displays a negative Romberg sign. Coordination and gait normal.  Skin: Skin is warm and dry. No rash noted.  Psychiatric: He has a normal mood and affect. His behavior is normal. Judgment and thought content normal.  LS tender  Lab Results  Component Value Date   WBC 5.1 11/24/2017   HGB 13.2 11/24/2017   HCT 41.1 11/24/2017   PLT 265.0 11/24/2017   GLUCOSE 97 11/24/2017   CHOL 141 11/24/2017   TRIG 155.0 (H) 11/24/2017   HDL 38.20 (L) 11/24/2017   LDLDIRECT 127.1 08/30/2012   LDLCALC 72 11/24/2017   ALT 33 11/24/2017   AST 17 11/24/2017   NA 142  11/24/2017   K 4.9 11/24/2017   CL 104 11/24/2017   CREATININE 1.21 11/24/2017   BUN 16 11/24/2017   CO2 30 11/24/2017   TSH 2.58 11/24/2017   PSA 0.66 11/24/2017    Ct Abdomen Pelvis Wo Contrast  Result Date: 03/29/2017 CLINICAL DATA:  52 year old male with left abdominal pain and vomiting for 1 week. EXAM: CT ABDOMEN AND PELVIS WITHOUT CONTRAST TECHNIQUE: Multidetector CT imaging of the abdomen and pelvis was performed following the standard protocol without IV contrast. COMPARISON:  11/27/2010 and prior CTs FINDINGS: Please note that parenchymal abnormalities may be missed without intravenous contrast. Lower chest: No acute abnormality Hepatobiliary: The liver and gallbladder are unremarkable. No biliary dilatation. Pancreas: Unremarkable Spleen: Unremarkable Adrenals/Urinary Tract: The kidneys, adrenal glands and bladder are unremarkable. Stomach/Bowel: Stomach is within normal limits. Appendix appears normal. No evidence of bowel wall thickening, distention, or inflammatory changes. New A large amount of stool in the rectum is noted. Vascular/Lymphatic: No significant vascular findings are present. No enlarged abdominal or pelvic lymph nodes. Reproductive: Prostate is unremarkable. Other: No abdominal wall hernia or abnormality. No  abdominopelvic ascites. Musculoskeletal: No acute or significant osseous findings. IMPRESSION: No acute abnormality. Large amount of rectal stool without other significant abnormality. Electronically Signed   By: Harmon Pier M.D.   On: 03/29/2017 14:28    Assessment & Plan:   There are no diagnoses linked to this encounter.   No orders of the defined types were placed in this encounter.    Follow-up: No follow-ups on file.  Sonda Primes, MD

## 2018-06-02 NOTE — Assessment & Plan Note (Signed)
Doxy prn 

## 2018-06-02 NOTE — Assessment & Plan Note (Signed)
Adderall

## 2018-06-02 NOTE — Assessment & Plan Note (Signed)
Norco prn  Potential benefits of a long term opioids use as well as potential risks (i.e. addiction risk, apnea etc) and complications (i.e. Somnolence, constipation and others) were explained to the patient and were aknowledged. 

## 2018-06-05 ENCOUNTER — Other Ambulatory Visit: Payer: Self-pay | Admitting: Internal Medicine

## 2018-06-08 ENCOUNTER — Other Ambulatory Visit: Payer: Self-pay | Admitting: Pharmacist

## 2018-06-08 MED ORDER — EMTRICITABINE-TENOFOVIR DF 200-300 MG PO TABS: 1.0000 | ORAL_TABLET | Freq: Every day | ORAL | 1 refills | Status: DC

## 2018-07-03 MED FILL — ALPRAZolam 2 MG TABS: 2 | 30 days supply | Qty: 90 | Fill #0

## 2018-07-03 MED FILL — HYDROCODON-APAP 10-325: 10-325 | 30 days supply | Qty: 120 | Fill #0

## 2018-07-03 MED FILL — TRUVADA 200-300 MG TABS: 200-300 | 30 days supply | Qty: 30 | Fill #0

## 2018-07-03 MED FILL — AMPHETAMINE-DEXTROAMPHETAMI: 10 | 30 days supply | Qty: 60 | Fill #0

## 2018-07-31 MED FILL — TRUVADA 200-300 MG TABS: 200-300 | 30 days supply | Qty: 30 | Fill #1

## 2018-07-31 MED FILL — ALPRAZolam 2 MG TABS: 2 | 30 days supply | Qty: 90 | Fill #1

## 2018-08-01 ENCOUNTER — Ambulatory Visit: Payer: 59 | Admitting: Internal Medicine

## 2018-08-01 ENCOUNTER — Encounter: Payer: Self-pay | Admitting: Internal Medicine

## 2018-08-01 DIAGNOSIS — F4323 Adjustment disorder with mixed anxiety and depressed mood: Secondary | ICD-10-CM

## 2018-08-01 DIAGNOSIS — F988 Other specified behavioral and emotional disorders with onset usually occurring in childhood and adolescence: Secondary | ICD-10-CM | POA: Diagnosis not present

## 2018-08-01 DIAGNOSIS — K591 Functional diarrhea: Secondary | ICD-10-CM

## 2018-08-01 DIAGNOSIS — R151 Fecal smearing: Secondary | ICD-10-CM | POA: Diagnosis not present

## 2018-08-01 MED ORDER — RIFAXIMIN 550 MG PO TABS: 550.0000 mg | ORAL_TABLET | Freq: Three times a day (TID) | ORAL | 2 refills | Status: DC

## 2018-08-01 MED ORDER — TRIAMCINOLONE ACETONIDE 0.1 % EX OINT: 1.0000 "application " | TOPICAL_OINTMENT | Freq: Two times a day (BID) | CUTANEOUS | 2 refills | Status: DC

## 2018-08-01 MED FILL — TRIAMCINOLONE 0.1% OINTMENT: 0.1 | 20 days supply | Qty: 80 | Fill #0

## 2018-08-01 NOTE — Assessment & Plan Note (Signed)
Adderall prn

## 2018-08-01 NOTE — Assessment & Plan Note (Signed)
Try Creon 1 with each meal  Will try to approve Xifaxan FMLA GI consult w/Dr Russella Dar

## 2018-08-01 NOTE — Patient Instructions (Signed)
Try Creon 1 with each meal

## 2018-08-01 NOTE — Assessment & Plan Note (Addendum)
6.16: Viberzi  - 100 mg bid 11/19 Try Creon 1 with each meal  Will try to approve Xifaxan FMLA Will consult Dr Russella Dar

## 2018-08-01 NOTE — Progress Notes (Signed)
Subjective:  Patient ID: Mark Mcgee, male    DOB: 1965-11-10  Age: 52 y.o. MRN: [id number]  CC: No chief complaint on file.   HPI Mark Mcgee presents for IBS-d - severe, CFS - worse, chronic pain   Outpatient Medications Prior to Visit  Medication Sig Dispense Refill  . acyclovir (ZOVIRAX) 400 MG tablet TAKE 1 TABLET BY MOUTH 4 TIMES DAILY 120 tablet 1  . alprazolam (XANAX) 2 MG tablet TAKE 1 TABLET BY MOUTH 3 TIMES DAILY 90 tablet 1  . amLODipine-olmesartan (AZOR) 10-40 MG tablet TAKE 1 TABLET BY MOUTH ONCE DAILY 90 tablet 3  . amphetamine-dextroamphetamine (ADDERALL) 10 MG tablet Take 1 tablet (10 mg total) by mouth 2 (two) times daily with breakfast and lunch. 60 tablet 0  . b complex vitamins tablet Take 1 tablet by mouth daily.      . Cholecalciferol 1000 UNITS tablet Take 2,000 Units by mouth daily.      . diphenoxylate-atropine (LOMOTIL) 2.5-0.025 MG tablet TAKE 1 OR 2 TABLETS BY MOUTH 4 TIMES DAILY AS NEEDED FOR DIARRHEA OR LOOSE STOOLS MAX OF 8 TABLETS PER DAY 60 tablet 3  . doxycycline (VIBRA-TABS) 100 MG tablet Take 1 tablet (100 mg total) by mouth 2 (two) times daily. 28 tablet 0  . Eluxadoline (VIBERZI) 100 MG TABS Take 1 tablet (100 mg total) by mouth 2 (two) times daily. 180 tablet 1  . emtricitabine-tenofovir (TRUVADA) 200-300 MG tablet Take 1 tablet by mouth daily. 90 tablet 1  . Flaxseed, Linseed, (FLAX SEEDS) POWD Take by mouth 2 (two) times daily.      [name] glycopyrrolate (ROBINUL) 2 MG tablet Take 1 tablet (2 mg total) by mouth 2 (two) times daily. 180 tablet 0  . halobetasol (ULTRAVATE) 0.05 % ointment Apply topically 2 (two) times daily. 50 g 3  . HYDROcodone-acetaminophen (NORCO) 10-325 MG tablet TAKE 1 TABLET BY MOUTH EVERY 6 HOURS AS NEEDED FOR PAIN 120 tablet 0  . hyoscyamine (LEVSIN, ANASPAZ) 0.125 MG tablet TAKE 1 TO 2 TABLETS BY MOUTH EVERY 4 HOURS AS NEEDED 180 tablet 1  . ibuprofen (ADVIL,MOTRIN) 400 MG tablet TAKE 1 TABLET BY MOUTH TWICE A DAY AS  NEEDED 60 tablet 2  . ipratropium (ATROVENT) 0.03 % nasal spray Place 2 sprays into both nostrils 2 (two) times daily. 30 mL 5  . Multiple Vitamin (MULTIVITAMIN) capsule Take by mouth.    . ondansetron (ZOFRAN) 4 MG tablet Take 1 tablet (4 mg total) by mouth every 8 (eight) hours as needed for nausea or vomiting. 30 tablet 0  . predniSONE (DELTASONE) 10 MG tablet Prednisone 10 mg: take 4 tabs a day x 3 days; then 3 tabs a day x 4 days; then 2 tabs a day x 4 days, then 1 tab a day x 6 days, then stop. Take pc. 38 tablet 0  . saccharomyces boulardii (FLORASTOR) 250 MG capsule Take 1 capsule (250 mg total) by mouth 2 (two) times daily. 60 capsule 3  . tadalafil (CIALIS) 5 MG tablet TAKE 1 TABLET BY MOUTH ONCE DAILY 90 tablet 3  . testosterone cypionate (DEPOTESTOSTERONE CYPIONATE) 200 MG/ML injection Inject 1 mL (200 mg total) into the muscle every 14 (fourteen) days. 10 mL 5  . triamcinolone (KENALOG) 0.1 % paste Use as directed 1 application in the mouth or throat 3 (three) times daily. 5 g 3  . Vitamin D, Ergocalciferol, (DRISDOL) 50000 units CAPS capsule Take 1 capsule (50,000 Units total) by mouth once a week.  8 capsule 0  . rifaximin (XIFAXAN) 550 MG TABS tablet Take 1 tablet (550 mg total) by mouth 3 (three) times daily. For IBS-D flare-up: take for 14 days. May repeat the course 2 more times per year for flare-ups. 56 tablet 2   No facility-administered medications prior to visit.     ROS: Review of Systems  Constitutional: Positive for fatigue. Negative for appetite change and unexpected weight change.  HENT: Negative for congestion, nosebleeds, sneezing, sore throat and trouble swallowing.   Eyes: Negative for itching and visual disturbance.  Respiratory: Negative for cough.   Cardiovascular: Negative for chest pain, palpitations and leg swelling.  Gastrointestinal: Positive for diarrhea. Negative for abdominal distention, blood in stool and nausea.  Genitourinary: Negative for  frequency and hematuria.  Musculoskeletal: Positive for arthralgias and back pain. Negative for gait problem, joint swelling and neck pain.  Skin: Negative for rash.  Neurological: Positive for weakness. Negative for dizziness, tremors and speech difficulty.  Psychiatric/Behavioral: Positive for decreased concentration, dysphoric mood and sleep disturbance. Negative for agitation. The patient is nervous/anxious.     Objective:  BP 116/72 (BP Location: Left Arm, Patient Position: Sitting, Cuff Size: Large)   Pulse 69   Temp 98 F (36.7 C) (Oral)   Ht 5\' 7"  (1.702 m)   Wt 209 lb (94.8 kg)   SpO2 98%   BMI 32.73 kg/m   BP Readings from Last 3 Encounters:  08/01/18 116/72  06/02/18 114/66  03/03/18 110/64    Wt Readings from Last 3 Encounters:  08/01/18 209 lb (94.8 kg)  06/02/18 210 lb (95.3 kg)  03/03/18 205 lb (93 kg)    Physical Exam  Constitutional: He is oriented to person, place, and time. He appears well-developed. No distress.  NAD  HENT:  Mouth/Throat: Oropharynx is clear and moist.  Eyes: Pupils are equal, round, and reactive to light. Conjunctivae are normal.  Neck: Normal range of motion. No JVD present. No thyromegaly present.  Cardiovascular: Normal rate, regular rhythm, normal heart sounds and intact distal pulses. Exam reveals no gallop and no friction rub.  No murmur heard. Pulmonary/Chest: Effort normal and breath sounds normal. No respiratory distress. He has no wheezes. He has no rales. He exhibits no tenderness.  Abdominal: Soft. Bowel sounds are normal. He exhibits no distension and no mass. There is no tenderness. There is no rebound and no guarding.  Musculoskeletal: Normal range of motion. He exhibits no edema or tenderness.  Lymphadenopathy:    He has no cervical adenopathy.  Neurological: He is alert and oriented to person, place, and time. He has normal reflexes. No cranial nerve deficit. He exhibits normal muscle tone. He displays a negative  Romberg sign. Coordination and gait normal.  Skin: Skin is warm and dry. No rash noted.  Psychiatric: He has a normal mood and affect. His behavior is normal. Judgment and thought content normal.  sad  Lab Results  Component Value Date   WBC 5.1 11/24/2017   HGB 13.2 11/24/2017   HCT 41.1 11/24/2017   PLT 265.0 11/24/2017   GLUCOSE 97 11/24/2017   CHOL 141 11/24/2017   TRIG 155.0 (H) 11/24/2017   HDL 38.20 (L) 11/24/2017   LDLDIRECT 127.1 08/30/2012   LDLCALC 72 11/24/2017   ALT 33 11/24/2017   AST 17 11/24/2017   NA 142 11/24/2017   K 4.9 11/24/2017   CL 104 11/24/2017   CREATININE 1.21 11/24/2017   BUN 16 11/24/2017   CO2 30 11/24/2017   TSH 2.58  11/24/2017   PSA 0.66 11/24/2017    Ct Abdomen Pelvis Wo Contrast  Result Date: 03/29/2017 CLINICAL DATA:  52 year old male with left abdominal pain and vomiting for 1 week. EXAM: CT ABDOMEN AND PELVIS WITHOUT CONTRAST TECHNIQUE: Multidetector CT imaging of the abdomen and pelvis was performed following the standard protocol without IV contrast. COMPARISON:  11/27/2010 and prior CTs FINDINGS: Please note that parenchymal abnormalities may be missed without intravenous contrast. Lower chest: No acute abnormality Hepatobiliary: The liver and gallbladder are unremarkable. No biliary dilatation. Pancreas: Unremarkable Spleen: Unremarkable Adrenals/Urinary Tract: The kidneys, adrenal glands and bladder are unremarkable. Stomach/Bowel: Stomach is within normal limits. Appendix appears normal. No evidence of bowel wall thickening, distention, or inflammatory changes. New A large amount of stool in the rectum is noted. Vascular/Lymphatic: No significant vascular findings are present. No enlarged abdominal or pelvic lymph nodes. Reproductive: Prostate is unremarkable. Other: No abdominal wall hernia or abnormality. No abdominopelvic ascites. Musculoskeletal: No acute or significant osseous findings. IMPRESSION: No acute abnormality. Large amount of  rectal stool without other significant abnormality. Electronically Signed   By: Harmon Pier M.D.   On: 03/29/2017 14:28    Assessment & Plan:   There are no diagnoses linked to this encounter.   Meds ordered this encounter  Medications  . rifaximin (XIFAXAN) 550 MG TABS tablet    Sig: Take 1 tablet (550 mg total) by mouth 3 (three) times daily. For IBS-D flare-up: take for 14 days. May repeat the course 2 more times per year for flare-ups.    Dispense:  56 tablet    Refill:  2     Follow-up: No follow-ups on file.  Sonda Primes, MD

## 2018-08-01 NOTE — Assessment & Plan Note (Signed)
Chronic, severe Xanax prn

## 2018-08-02 ENCOUNTER — Encounter: Payer: Self-pay | Admitting: Gastroenterology

## 2018-08-03 DIAGNOSIS — F4323 Adjustment disorder with mixed anxiety and depressed mood: Secondary | ICD-10-CM | POA: Diagnosis not present

## 2018-08-09 ENCOUNTER — Telehealth: Payer: Self-pay | Admitting: Internal Medicine

## 2018-08-09 NOTE — Telephone Encounter (Signed)
Copied from CRM [id number]. Topic: Quick Communication - See Telephone Encounter >> Aug 09, 2018  5:18 PM Trula SladeWalter, Linda F wrote: CRM for notification. See Telephone encounter for: 08/09/18. Wynona CanesChristine w/[facility] [phone] stated the patient's Xifaxan medication was sent for a prior authorization on 08/03/18 and they still have not heard anything about approval or not. Please advise.

## 2018-08-10 DIAGNOSIS — F4323 Adjustment disorder with mixed anxiety and depressed mood: Secondary | ICD-10-CM | POA: Diagnosis not present

## 2018-08-10 NOTE — Telephone Encounter (Signed)
Key: [id number]

## 2018-08-11 MED FILL — AMLODIPINE-OLMESARTAN 10-40: 10-40 | 90 days supply | Qty: 90 | Fill #2

## 2018-08-11 MED FILL — HYDROCODON-APAP 10-325: 10-325 | 30 days supply | Qty: 120 | Fill #0

## 2018-08-11 MED FILL — TADALAFIL 5 MG TABS: 5 | 90 days supply | Qty: 90 | Fill #1

## 2018-08-11 MED FILL — AMPHETAMINE-DEXTROAMPHETAMI: 10 | 30 days supply | Qty: 60 | Fill #0

## 2018-08-14 NOTE — Telephone Encounter (Signed)
PA denied unless it is prescribed by gastro.  Please advise

## 2018-08-14 NOTE — Telephone Encounter (Signed)
[facility] from [facility] calling back stating they still have not received a call back about prior auth for Xifaxan.

## 2018-08-14 NOTE — Telephone Encounter (Signed)
Pls inform Prudencio. He should have an appt coming up w/Dr Russella DarStark - he needs to talk to him re: Rx Xifaxan Thx

## 2018-08-15 NOTE — Telephone Encounter (Signed)
I spoke to pt and he was wondering if Dr. Russella DarStark would send this medication in before his appointment so he can start medication.

## 2018-08-15 NOTE — Telephone Encounter (Signed)
I am unable to prescribe medication since he has not been seen by [facility] since 2010. Please encourage him to schedule a sooner appointment with one of our GI APPs to receive any needed medication from GI in a more timely manner.

## 2018-08-16 DIAGNOSIS — Z0279 Encounter for issue of other medical certificate: Secondary | ICD-10-CM

## 2018-08-16 NOTE — Telephone Encounter (Signed)
Pt notified and voiced understanding 

## 2018-08-16 NOTE — Telephone Encounter (Signed)
Forms have been completed&signed by Dr.Plotnikov, Faxed to [employer], copy sent to scan &charged for.   Patient is aware to pick up original.   Kathie Rhodes(Betty can you close this it will not let me. Thank you. )

## 2018-08-17 NOTE — Telephone Encounter (Signed)
I have added the updates to the forms and faxed it back to [employer].   Patient has been informed. He will pick it up at his appointment on 12/5

## 2018-08-22 ENCOUNTER — Telehealth: Payer: Self-pay | Admitting: Internal Medicine

## 2018-08-22 NOTE — Telephone Encounter (Signed)
Pharmacy sent another PA but CoverMyMeds was notified that last one was denied and we are not doing another one

## 2018-08-22 NOTE — Telephone Encounter (Signed)
Copied from CRM [id number]. Topic: Quick Communication - See Telephone Encounter >> Aug 22, 2018  9:53 AM Lorrine KinMcGee, Ponce Skillman B, NT wrote: CRM for notification. See Telephone encounter for: 08/22/18. Ruben with [employer] requesting to speak to someone regarding a PA. Would like to verify that the prior authorization has been received regarding the rifaximin (XIFAXAN) 550 MG TABS tablet. Please advise.   CB#: [id number]

## 2018-08-29 MED FILL — TRUVADA 200-300 MG TABS: 200-300 | 30 days supply | Qty: 30 | Fill #2

## 2018-08-31 ENCOUNTER — Other Ambulatory Visit (INDEPENDENT_AMBULATORY_CARE_PROVIDER_SITE_OTHER): Payer: 59

## 2018-08-31 ENCOUNTER — Encounter: Payer: Self-pay | Admitting: Internal Medicine

## 2018-08-31 ENCOUNTER — Ambulatory Visit: Payer: 59 | Admitting: Internal Medicine

## 2018-08-31 DIAGNOSIS — R894 Abnormal immunological findings in specimens from other organs, systems and tissues: Secondary | ICD-10-CM

## 2018-08-31 DIAGNOSIS — K591 Functional diarrhea: Secondary | ICD-10-CM

## 2018-08-31 DIAGNOSIS — Z5181 Encounter for therapeutic drug level monitoring: Secondary | ICD-10-CM

## 2018-08-31 DIAGNOSIS — F410 Panic disorder [episodic paroxysmal anxiety] without agoraphobia: Secondary | ICD-10-CM | POA: Diagnosis not present

## 2018-08-31 DIAGNOSIS — G8929 Other chronic pain: Secondary | ICD-10-CM

## 2018-08-31 DIAGNOSIS — M5440 Lumbago with sciatica, unspecified side: Secondary | ICD-10-CM | POA: Diagnosis not present

## 2018-08-31 DIAGNOSIS — F988 Other specified behavioral and emotional disorders with onset usually occurring in childhood and adolescence: Secondary | ICD-10-CM

## 2018-08-31 DIAGNOSIS — I10 Essential (primary) hypertension: Secondary | ICD-10-CM | POA: Diagnosis not present

## 2018-08-31 LAB — BASIC METABOLIC PANEL WITH GFR
BUN: 18 mg/dL (ref 6–23)
CO2: 26 meq/L (ref 19–32)
Calcium: 9.8 mg/dL (ref 8.4–10.5)
Chloride: 102 meq/L (ref 96–112)
Creatinine, Ser: 1.23 mg/dL (ref 0.40–1.50)
GFR: 65.83 mL/min (ref >60.00)
Glucose, Bld: 96 mg/dL (ref 70–99)
Potassium: 4.1 meq/L (ref 3.5–5.1)
Sodium: 138 meq/L (ref 135–145)

## 2018-08-31 LAB — CBC WITH DIFFERENTIAL/PLATELET
Basophils Absolute: 0.1 K/uL (ref 0.0–0.1)
Basophils Relative: 1.1 % (ref 0.0–3.0)
Eosinophils Absolute: 0.2 K/uL (ref 0.0–0.7)
Eosinophils Relative: 3 % (ref 0.0–5.0)
HCT: 41.6 % (ref 39.0–52.0)
Hemoglobin: 13.4 g/dL (ref 13.0–17.0)
Lymphocytes Relative: 20.7 % (ref 12.0–46.0)
Lymphs Abs: 1.3 K/uL (ref 0.7–4.0)
MCHC: 32.3 g/dL (ref 30.0–36.0)
MCV: 77.7 fl — ABNORMAL LOW (ref 78.0–100.0)
Monocytes Absolute: 0.5 K/uL (ref 0.1–1.0)
Monocytes Relative: 7.5 % (ref 3.0–12.0)
Neutro Abs: 4.2 K/uL (ref 1.4–7.7)
Neutrophils Relative %: 67.7 % (ref 43.0–77.0)
Platelets: 288 K/uL (ref 150.0–400.0)
RBC: 5.35 Mil/uL (ref 4.22–5.81)
RDW: 14.4 % (ref 11.5–15.5)
WBC: 6.3 K/uL (ref 4.0–10.5)

## 2018-08-31 LAB — HEPATIC FUNCTION PANEL
ALT: 40 U/L (ref 0–53)
AST: 20 U/L (ref 0–37)
Albumin: 5 g/dL (ref 3.5–5.2)
Alkaline Phosphatase: 74 U/L (ref 39–117)
Bilirubin, Direct: 0.1 mg/dL (ref 0.0–0.3)
Total Bilirubin: 0.7 mg/dL (ref 0.2–1.2)
Total Protein: 7.6 g/dL (ref 6.0–8.3)

## 2018-08-31 MED ORDER — AMPHETAMINE-DEXTROAMPHETAMINE 10 MG PO TABS: 10.0000 mg | ORAL_TABLET | Freq: Two times a day (BID) | ORAL | 0 refills | Status: DC

## 2018-08-31 MED ORDER — HYDROCODONE-ACETAMINOPHEN 10-325 MG PO TABS: ORAL_TABLET | ORAL | 0 refills | Status: DC

## 2018-08-31 NOTE — Assessment & Plan Note (Signed)
Xanax prn  Potential benefits of a long term benzodiazepines  use as well as potential risks  and complications were explained to the patient and were aknowledged. 

## 2018-08-31 NOTE — Assessment & Plan Note (Signed)
Labs for Truvada

## 2018-08-31 NOTE — Progress Notes (Signed)
Subjective:  Patient ID: Mark Mcgee, male    DOB: 1965/11/17  Age: 52 y.o. MRN: [id number]  CC: No chief complaint on file.   HPI Mark Mcgee presents for IBS, LBP, GERD, anxiety f/u  Outpatient Medications Prior to Visit  Medication Sig Dispense Refill  . acyclovir (ZOVIRAX) 400 MG tablet TAKE 1 TABLET BY MOUTH 4 TIMES DAILY 120 tablet 1  . alprazolam (XANAX) 2 MG tablet TAKE 1 TABLET BY MOUTH 3 TIMES DAILY 90 tablet 1  . amLODipine-olmesartan (AZOR) 10-40 MG tablet TAKE 1 TABLET BY MOUTH ONCE DAILY 90 tablet 3  . amphetamine-dextroamphetamine (ADDERALL) 10 MG tablet Take 1 tablet (10 mg total) by mouth 2 (two) times daily with breakfast and lunch. 60 tablet 0  . b complex vitamins tablet Take 1 tablet by mouth daily.      . Cholecalciferol 1000 UNITS tablet Take 2,000 Units by mouth daily.      . diphenoxylate-atropine (LOMOTIL) 2.5-0.025 MG tablet TAKE 1 OR 2 TABLETS BY MOUTH 4 TIMES DAILY AS NEEDED FOR DIARRHEA OR LOOSE STOOLS MAX OF 8 TABLETS PER DAY 60 tablet 3  . doxycycline (VIBRA-TABS) 100 MG tablet Take 1 tablet (100 mg total) by mouth 2 (two) times daily. 28 tablet 0  . Eluxadoline (VIBERZI) 100 MG TABS Take 1 tablet (100 mg total) by mouth 2 (two) times daily. 180 tablet 1  . emtricitabine-tenofovir (TRUVADA) 200-300 MG tablet Take 1 tablet by mouth daily. 90 tablet 1  . Flaxseed, Linseed, (FLAX SEEDS) POWD Take by mouth 2 (two) times daily.      [name] glycopyrrolate (ROBINUL) 2 MG tablet Take 1 tablet (2 mg total) by mouth 2 (two) times daily. 180 tablet 0  . halobetasol (ULTRAVATE) 0.05 % ointment Apply topically 2 (two) times daily. 50 g 3  . HYDROcodone-acetaminophen (NORCO) 10-325 MG tablet TAKE 1 TABLET BY MOUTH EVERY 6 HOURS AS NEEDED FOR PAIN 120 tablet 0  . hyoscyamine (LEVSIN, ANASPAZ) 0.125 MG tablet TAKE 1 TO 2 TABLETS BY MOUTH EVERY 4 HOURS AS NEEDED 180 tablet 1  . ibuprofen (ADVIL,MOTRIN) 400 MG tablet TAKE 1 TABLET BY MOUTH TWICE A DAY AS NEEDED 60 tablet  2  . ipratropium (ATROVENT) 0.03 % nasal spray Place 2 sprays into both nostrils 2 (two) times daily. 30 mL 5  . Multiple Vitamin (MULTIVITAMIN) capsule Take by mouth.    . ondansetron (ZOFRAN) 4 MG tablet Take 1 tablet (4 mg total) by mouth every 8 (eight) hours as needed for nausea or vomiting. 30 tablet 0  . predniSONE (DELTASONE) 10 MG tablet Prednisone 10 mg: take 4 tabs a day x 3 days; then 3 tabs a day x 4 days; then 2 tabs a day x 4 days, then 1 tab a day x 6 days, then stop. Take pc. 38 tablet 0  . rifaximin (XIFAXAN) 550 MG TABS tablet Take 1 tablet (550 mg total) by mouth 3 (three) times daily. For IBS-D flare-up: take for 14 days. May repeat the course 2 more times per year for flare-ups. 56 tablet 2  . saccharomyces boulardii (FLORASTOR) 250 MG capsule Take 1 capsule (250 mg total) by mouth 2 (two) times daily. 60 capsule 3  . tadalafil (CIALIS) 5 MG tablet TAKE 1 TABLET BY MOUTH ONCE DAILY 90 tablet 3  . testosterone cypionate (DEPOTESTOSTERONE CYPIONATE) 200 MG/ML injection Inject 1 mL (200 mg total) into the muscle every 14 (fourteen) days. 10 mL 5  . triamcinolone (KENALOG) 0.1 % paste Use  as directed 1 application in the mouth or throat 3 (three) times daily. 5 g 3  . triamcinolone ointment (KENALOG) 0.1 % Apply 1 application topically 2 (two) times daily. 80 g 2  . Vitamin D, Ergocalciferol, (DRISDOL) 50000 units CAPS capsule Take 1 capsule (50,000 Units total) by mouth once a week. 8 capsule 0   No facility-administered medications prior to visit.     ROS: Review of Systems  Constitutional: Negative for appetite change, fatigue and unexpected weight change.  HENT: Negative for congestion, nosebleeds, sneezing, sore throat and trouble swallowing.   Eyes: Negative for itching and visual disturbance.  Respiratory: Negative for cough.   Cardiovascular: Positive for chest pain. Negative for palpitations and leg swelling.  Gastrointestinal: Positive for abdominal pain, diarrhea  and nausea. Negative for abdominal distention and blood in stool.  Genitourinary: Negative for frequency and hematuria.  Musculoskeletal: Positive for back pain. Negative for gait problem, joint swelling and neck pain.  Skin: Negative for rash.  Neurological: Negative for dizziness, tremors, speech difficulty and weakness.  Psychiatric/Behavioral: Positive for dysphoric mood. Negative for agitation, sleep disturbance and suicidal ideas. The patient is nervous/anxious.     Objective:  BP 124/78 (BP Location: Left Arm, Patient Position: Sitting, Cuff Size: Large)   Pulse 69   Temp 98 F (36.7 C) (Oral)   Ht 5\' 7"  (1.702 m)   Wt 214 lb (97.1 kg)   SpO2 96%   BMI 33.52 kg/m   BP Readings from Last 3 Encounters:  08/31/18 124/78  08/01/18 116/72  06/02/18 114/66    Wt Readings from Last 3 Encounters:  08/31/18 214 lb (97.1 kg)  08/01/18 209 lb (94.8 kg)  06/02/18 210 lb (95.3 kg)    Physical Exam  Constitutional: He is oriented to person, place, and time. He appears well-developed. No distress.  NAD  HENT:  Mouth/Throat: Oropharynx is clear and moist.  Eyes: Pupils are equal, round, and reactive to light. Conjunctivae are normal.  Neck: Normal range of motion. No JVD present. No thyromegaly present.  Cardiovascular: Normal rate, regular rhythm, normal heart sounds and intact distal pulses. Exam reveals no gallop and no friction rub.  No murmur heard. Pulmonary/Chest: Effort normal and breath sounds normal. No respiratory distress. He has no wheezes. He has no rales. He exhibits no tenderness.  Abdominal: Soft. Bowel sounds are normal. He exhibits no distension and no mass. There is no tenderness. There is no rebound and no guarding.  Musculoskeletal: Normal range of motion. He exhibits tenderness. He exhibits no edema.  Lymphadenopathy:    He has no cervical adenopathy.  Neurological: He is alert and oriented to person, place, and time. He has normal reflexes. No cranial nerve  deficit. He exhibits normal muscle tone. He displays a negative Romberg sign. Coordination and gait normal.  Skin: Skin is warm and dry. No rash noted.  Psychiatric: He has a normal mood and affect. His behavior is normal. Judgment and thought content normal.    Lab Results  Component Value Date   WBC 5.1 11/24/2017   HGB 13.2 11/24/2017   HCT 41.1 11/24/2017   PLT 265.0 11/24/2017   GLUCOSE 97 11/24/2017   CHOL 141 11/24/2017   TRIG 155.0 (H) 11/24/2017   HDL 38.20 (L) 11/24/2017   LDLDIRECT 127.1 08/30/2012   LDLCALC 72 11/24/2017   ALT 33 11/24/2017   AST 17 11/24/2017   NA 142 11/24/2017   K 4.9 11/24/2017   CL 104 11/24/2017   CREATININE 1.21 11/24/2017  BUN 16 11/24/2017   CO2 30 11/24/2017   TSH 2.58 11/24/2017   PSA 0.66 11/24/2017    Ct Abdomen Pelvis Wo Contrast  Result Date: 03/29/2017 CLINICAL DATA:  52 year old male with left abdominal pain and vomiting for 1 week. EXAM: CT ABDOMEN AND PELVIS WITHOUT CONTRAST TECHNIQUE: Multidetector CT imaging of the abdomen and pelvis was performed following the standard protocol without IV contrast. COMPARISON:  11/27/2010 and prior CTs FINDINGS: Please note that parenchymal abnormalities may be missed without intravenous contrast. Lower chest: No acute abnormality Hepatobiliary: The liver and gallbladder are unremarkable. No biliary dilatation. Pancreas: Unremarkable Spleen: Unremarkable Adrenals/Urinary Tract: The kidneys, adrenal glands and bladder are unremarkable. Stomach/Bowel: Stomach is within normal limits. Appendix appears normal. No evidence of bowel wall thickening, distention, or inflammatory changes. New A large amount of stool in the rectum is noted. Vascular/Lymphatic: No significant vascular findings are present. No enlarged abdominal or pelvic lymph nodes. Reproductive: Prostate is unremarkable. Other: No abdominal wall hernia or abnormality. No abdominopelvic ascites. Musculoskeletal: No acute or significant osseous  findings. IMPRESSION: No acute abnormality. Large amount of rectal stool without other significant abnormality. Electronically Signed   By: Harmon PierJeffrey  Hu M.D.   On: 03/29/2017 14:28    Assessment & Plan:   There are no diagnoses linked to this encounter.   No orders of the defined types were placed in this encounter.    Follow-up: No follow-ups on file.  Sonda PrimesAlex Babyboy Loya, MD

## 2018-08-31 NOTE — Assessment & Plan Note (Signed)
Adderall Potential benefits of a long term stimulants use as well as potential risks  and complications were explained to the patient and were aknowledged. 

## 2018-08-31 NOTE — Assessment & Plan Note (Signed)
Truvada labs

## 2018-08-31 NOTE — Patient Instructions (Signed)

## 2018-08-31 NOTE — Assessment & Plan Note (Signed)
Norco prn  Potential benefits of a long term opioids use as well as potential risks (i.e. addiction risk, apnea etc) and complications (i.e. Somnolence, constipation and others) were explained to the patient and were aknowledged. 

## 2018-08-31 NOTE — Assessment & Plan Note (Addendum)
GI appt next week  Xifaxan - needs to be prescribed by a specialist

## 2018-08-31 NOTE — Assessment & Plan Note (Signed)
Azor 

## 2018-09-04 LAB — RPR: RPR Ser Ql: NONREACTIVE

## 2018-09-04 LAB — HIV-1 RNA QUANT-NO REFLEX-BLD
HIV 1 RNA Quant: <20 copies/mL
HIV-1 RNA Quant, Log: <1.3 Log copies/mL

## 2018-09-06 ENCOUNTER — Ambulatory Visit: Payer: 59 | Admitting: Gastroenterology

## 2018-09-06 ENCOUNTER — Other Ambulatory Visit: Payer: 59

## 2018-09-06 ENCOUNTER — Encounter: Payer: Self-pay | Admitting: Gastroenterology

## 2018-09-06 VITALS — BP 110/66 | HR 60 | Ht 67.0 in | Wt 215.0 lb

## 2018-09-06 DIAGNOSIS — R1032 Left lower quadrant pain: Secondary | ICD-10-CM

## 2018-09-06 DIAGNOSIS — R152 Fecal urgency: Secondary | ICD-10-CM | POA: Diagnosis not present

## 2018-09-06 DIAGNOSIS — R197 Diarrhea, unspecified: Secondary | ICD-10-CM | POA: Diagnosis not present

## 2018-09-06 MED ORDER — RIFAXIMIN 550 MG PO TABS: 550.0000 mg | ORAL_TABLET | Freq: Three times a day (TID) | ORAL | 0 refills | Status: DC

## 2018-09-06 NOTE — Patient Instructions (Signed)
Your provider has requested that you go to the basement level for lab work before leaving today. Press "B" on the elevator. The lab is located at the first door on the left as you exit the elevator.  We have sent your demographic information and a prescription for Xifaxan to [facility]. This pharmacy is able to get medication approved through insurance and get you the lowest copay possible. If you have not heard from them within 1 week, please call our office at [phone] to let us know.  You have been given a low fodmap diet to follow.   Normal BMI (Body Mass Index- based on height and weight) is between 19 and 25. Your BMI today is Body mass index is 33.67 kg/m. [name]. Please consider follow up  regarding your BMI with your Primary Care Provider.   Thank you for choosing me and [facility].  Venita LickMalcolm T. Pleas KochStark, Jr., MD., Clementeen GrahamFACG

## 2018-09-06 NOTE — Progress Notes (Signed)
History of Present Illness: This is a 52 year old male referred by Plotnikov, Georgina QuintAleksei V, MD for the evaluation of chronic urgent diarrhea and left lower quadrant pain.  He has a history of IBS and was evaluated previously in 2010 including colonoscopy with normal biopsies.  He states he has had ongoing problems over the years with mild constipation alternating with urgent diarrhea associated with left lower quadrant pain.  He states on days where his symptoms are active he can have 5-10 urgent bowel movements some small volume some large volume.  He states his symptoms make him very anxious.  He has tried multiple medications which have not provided effective relief of his symptoms including Viberzi.  He states a gluten-free diet has helped.  He has not noted any other dietary stressors but he states he tries to eat a healthy diet.  Xifaxan was not approved by his insurance company. CBC, CMP, TSH normal recently. Denies weight loss, change in stool caliber, melena, hematochezia, nausea, vomiting, dysphagia, reflux symptoms, chest pain.   Allergies  Allergen Reactions  . Contrast Media  [Iodinated Diagnostic Agents] Shortness Of Breath  . Buspirone Hcl   . Divalproex Sodium     REACTION: HA  . Olanzapine-Fluoxetine Hcl   . Piroxicam   . Wheat Bran    Outpatient Medications Prior to Visit  Medication Sig Dispense Refill  . acyclovir (ZOVIRAX) 400 MG tablet TAKE 1 TABLET BY MOUTH 4 TIMES DAILY 120 tablet 1  . alprazolam (XANAX) 2 MG tablet TAKE 1 TABLET BY MOUTH 3 TIMES DAILY 90 tablet 1  . amLODipine-olmesartan (AZOR) 10-40 MG tablet TAKE 1 TABLET BY MOUTH ONCE DAILY 90 tablet 3  . amphetamine-dextroamphetamine (ADDERALL) 10 MG tablet Take 1 tablet (10 mg total) by mouth 2 (two) times daily with breakfast and lunch. 60 tablet 0  . b complex vitamins tablet Take 1 tablet by mouth daily.      . Cholecalciferol 1000 UNITS tablet Take 2,000 Units by mouth daily.      . diphenoxylate-atropine  (LOMOTIL) 2.5-0.025 MG tablet TAKE 1 OR 2 TABLETS BY MOUTH 4 TIMES DAILY AS NEEDED FOR DIARRHEA OR LOOSE STOOLS MAX OF 8 TABLETS PER DAY 60 tablet 3  . doxycycline (VIBRA-TABS) 100 MG tablet Take 1 tablet (100 mg total) by mouth 2 (two) times daily. 28 tablet 0  . Eluxadoline (VIBERZI) 100 MG TABS Take 1 tablet (100 mg total) by mouth 2 (two) times daily. 180 tablet 1  . emtricitabine-tenofovir (TRUVADA) 200-300 MG tablet Take 1 tablet by mouth daily. 90 tablet 1  . Flaxseed, Linseed, (FLAX SEEDS) POWD Take by mouth 2 (two) times daily.      [name]. glycopyrrolate (ROBINUL) 2 MG tablet Take 1 tablet (2 mg total) by mouth 2 (two) times daily. 180 tablet 0  . halobetasol (ULTRAVATE) 0.05 % ointment Apply topically 2 (two) times daily. 50 g 3  . HYDROcodone-acetaminophen (NORCO) 10-325 MG tablet TAKE 1 TABLET BY MOUTH EVERY 6 HOURS AS NEEDED FOR PAIN 120 tablet 0  . hyoscyamine (LEVSIN, ANASPAZ) 0.125 MG tablet TAKE 1 TO 2 TABLETS BY MOUTH EVERY 4 HOURS AS NEEDED 180 tablet 1  . ibuprofen (ADVIL,MOTRIN) 400 MG tablet TAKE 1 TABLET BY MOUTH TWICE A DAY AS NEEDED 60 tablet 2  . ipratropium (ATROVENT) 0.03 % nasal spray Place 2 sprays into both nostrils 2 (two) times daily. 30 mL 5  . loperamide (IMODIUM) 2 MG capsule Take 2 mg by mouth as needed for diarrhea or loose  stools.    . Multiple Vitamin (MULTIVITAMIN) capsule Take by mouth.    . ondansetron (ZOFRAN) 4 MG tablet Take 1 tablet (4 mg total) by mouth every 8 (eight) hours as needed for nausea or vomiting. 30 tablet 0  . predniSONE (DELTASONE) 10 MG tablet Prednisone 10 mg: take 4 tabs a day x 3 days; then 3 tabs a day x 4 days; then 2 tabs a day x 4 days, then 1 tab a day x 6 days, then stop. Take pc. 38 tablet 0  . saccharomyces boulardii (FLORASTOR) 250 MG capsule Take 1 capsule (250 mg total) by mouth 2 (two) times daily. 60 capsule 3  . tadalafil (CIALIS) 5 MG tablet TAKE 1 TABLET BY MOUTH ONCE DAILY 90 tablet 3  . testosterone cypionate  (DEPOTESTOSTERONE CYPIONATE) 200 MG/ML injection Inject 1 mL (200 mg total) into the muscle every 14 (fourteen) days. 10 mL 5  . triamcinolone (KENALOG) 0.1 % paste Use as directed 1 application in the mouth or throat 3 (three) times daily. 5 g 3  . triamcinolone ointment (KENALOG) 0.1 % Apply 1 application topically 2 (two) times daily. 80 g 2  . Vitamin D, Ergocalciferol, (DRISDOL) 50000 units CAPS capsule Take 1 capsule (50,000 Units total) by mouth once a week. 8 capsule 0  . rifaximin (XIFAXAN) 550 MG TABS tablet Take 1 tablet (550 mg total) by mouth 3 (three) times daily. For IBS-D flare-up: take for 14 days. May repeat the course 2 more times per year for flare-ups. (Patient not taking: Reported on 09/06/2018) 56 tablet 2   No facility-administered medications prior to visit.    Past Medical History:  Diagnosis Date  . Anxiety   . Costochondritis    recurrent   . Depression    Dr Dub Mikes  . Fatigue 2009  . Genital herpes 2009  . GERD (gastroesophageal reflux disease)   . Heat stroke   . HTN (hypertension)   . Hyperthyroidism   . IBS (irritable bowel syndrome)   . Panic attack   . Prostatitis    Very painful flare-ups Dr Retta Diones  . Tremor    Past Surgical History:  Procedure Laterality Date  . MANDIBLE SURGERY     Right   Social History   Socioeconomic History  . Marital status: Single    Spouse name: Not on file  . Number of children: Not on file  . Years of education: Not on file  . Highest education level: Not on file  Occupational History  . Occupation: Teacher, adult education: [employer]  Social Needs  . Financial resource strain: Not on file  . Food insecurity:    Worry: Not on file    Inability: Not on file  . Transportation needs:    Medical: Not on file    Non-medical: Not on file  Tobacco Use  . Smoking status: Never Smoker  . Smokeless tobacco: Never Used  Substance and Sexual Activity  . Alcohol use: Yes    Comment: 2-6 beers every 2/wks  . Drug use:  No  . Sexual activity: Yes  Lifestyle  . Physical activity:    Days per week: Not on file    Minutes per session: Not on file  . Stress: Not on file  Relationships  . Social connections:    Talks on phone: Not on file    Gets together: Not on file    Attends religious service: Not on file    Active member of club or  organization: Not on file    Attends meetings of clubs or organizations: Not on file    Relationship status: Not on file  Other Topics Concern  . Not on file  Social History Narrative  . Not on file   Family History  Problem Relation Age of Onset  . Hyperlipidemia Other   . Coronary artery disease Other   . Stroke Mother   . Colon cancer Paternal Grandmother   . Diabetes Other   . Breast cancer Other   . Pancreatic cancer Other   . Cancer Other        breast, pancreatic  . Heart disease Other   . Cancer Maternal Grandmother        colon       Review of Systems: Pertinent positive and negative review of systems were noted in the above HPI section. All other review of systems were otherwise negative.    Physical Exam: General: Well developed, well nourished, no acute distress Head: Normocephalic and atraumatic Eyes:  sclerae anicteric, EOMI Ears: Normal auditory acuity Mouth: No deformity or lesions Neck: Supple, no masses or thyromegaly Lungs: Clear throughout to auscultation Heart: Regular rate and rhythm; no murmurs, rubs or bruits Abdomen: Soft, mild LLQ tenderness to deep palpation and non distended. No masses, hepatosplenomegaly or hernias noted. Normal Bowel sounds Rectal: Deferred Musculoskeletal: Symmetrical with no gross deformities  Skin: No lesions on visible extremities Pulses:  Normal pulses noted Extremities: No clubbing, cyanosis, edema or deformities noted Neurological: Alert oriented x 4, grossly nonfocal Cervical Nodes:  No significant cervical adenopathy Inguinal Nodes: No significant inguinal adenopathy Psychological:  Alert  and cooperative. Anxious.    Assessment and Recommendations:  1. Chronic diarrhea alternating with mild constipation, fecal urgency, LLQ pain.  Difficult to manage IBS with an alternating pattern where the diarrhea phase is much more severe.  Anxiety is contributing to his symptoms. GI pathogen panel and fecal elastase.  Attempt to obtain coverage for Xifaxan 550 mg 3 times daily for 2 weeks.  If this is not effective or not obtainable begin a low FODMAP diet.  Consider a course of a different antibiotic. Continue glycopyrrolate 2 mg twice daily, hyoscyamine 1-2 every 4 hours as needed, Imodium 2 mg 4 times daily as needed, Lomotil 1-2 every 4 hours as needed max 8 tablets/day.  Consider adding MiraLAX to achieve at least 1-2 bowel movements each day holding doses for diarrhea.  Consider repeat colonoscopy.  Consider referral to a tertiary center. REV in 2 months.    cc: Plotnikov, Georgina Quint, MD [address] [place], [place] [address]

## 2018-09-07 ENCOUNTER — Other Ambulatory Visit: Payer: 59

## 2018-09-07 DIAGNOSIS — F4323 Adjustment disorder with mixed anxiety and depressed mood: Secondary | ICD-10-CM | POA: Diagnosis not present

## 2018-09-07 DIAGNOSIS — R1032 Left lower quadrant pain: Secondary | ICD-10-CM

## 2018-09-07 DIAGNOSIS — R152 Fecal urgency: Secondary | ICD-10-CM

## 2018-09-07 DIAGNOSIS — R197 Diarrhea, unspecified: Secondary | ICD-10-CM

## 2018-09-13 MED FILL — TRUVADA 200-300 MG TABS: 200-300 | 30 days supply | Qty: 30 | Fill #2

## 2018-09-13 MED FILL — HYDROCODON-APAP 10-325: 10-325 | 30 days supply | Qty: 120 | Fill #0

## 2018-09-13 MED FILL — AMPHETAMINE-DEXTROAMPHETAMI: 10 | 30 days supply | Qty: 60 | Fill #0

## 2018-09-14 DIAGNOSIS — F4323 Adjustment disorder with mixed anxiety and depressed mood: Secondary | ICD-10-CM | POA: Diagnosis not present

## 2018-09-15 LAB — PANCREATIC ELASTASE, FECAL: Pancreatic Elastase-1, Stool: 388 mcg/g

## 2018-09-15 LAB — GASTROINTESTINAL PATHOGEN PANEL PCR
C. difficile Tox A/B, PCR: NOT DETECTED
Campylobacter, PCR: NOT DETECTED
Cryptosporidium, PCR: NOT DETECTED
E coli (ETEC) LT/ST PCR: NOT DETECTED
E coli (STEC) stx1/stx2, PCR: NOT DETECTED
E coli 0157, PCR: NOT DETECTED
Giardia lamblia, PCR: NOT DETECTED
Norovirus, PCR: NOT DETECTED
Rotavirus A, PCR: NOT DETECTED
Salmonella, PCR: NOT DETECTED
Shigella, PCR: NOT DETECTED

## 2018-09-18 ENCOUNTER — Telehealth: Payer: Self-pay

## 2018-09-18 NOTE — Telephone Encounter (Signed)
Xifaxan denial Message from [employer]:  This request has not been approved. Based on the information submitted for review, you did not meet our guideline rules for the requested drug. In order for your request to be approved, your provider would need to show that you have met the guideline rules below. For the treatment of irritable bowel syndrome with diarrhea (condition of stomach pain with many periods of diarrhea), our guideline named RIFAXIMIN '550mg'$  (the generic name for Xifaxan) requires that you meet the following criteria: 1) You have had a trial of or contraindication to tricyclic anti-depressants (e.g., amitriptyline, nortriptyline) or dicyclomine. This request has been denied because we did not receive information that you meet the requirements listed above.

## 2018-09-29 DIAGNOSIS — F4323 Adjustment disorder with mixed anxiety and depressed mood: Secondary | ICD-10-CM | POA: Diagnosis not present

## 2018-09-29 NOTE — Telephone Encounter (Signed)
Left a message for patient to return my call. 

## 2018-09-29 NOTE — Telephone Encounter (Signed)
Trial of dicyclomine 20 mg po tid ac, 1 month and 1 refill Per the Xifaxan a failed trial of dicyclomine might be sufficient to obtain Xifaxan

## 2018-10-03 NOTE — Telephone Encounter (Signed)
Left message for patient to return my call.

## 2018-10-04 MED ORDER — DICYCLOMINE HCL 20 MG PO TABS: 20.0000 mg | ORAL_TABLET | Freq: Three times a day (TID) | ORAL | 1 refills | Status: DC

## 2018-10-04 MED FILL — DICYCLOMINE 20 MG TABLET: 20 | 30 days supply | Qty: 90 | Fill #0

## 2018-10-04 NOTE — Telephone Encounter (Signed)
Patient returned my call and I informed him that Dr. Russella Dar wants him to start a trial of dicyclomine 20 mg tid ac before Xifaxan can be approved. Prescription sent to the pharmacy and patient verbalized understanding of plan.

## 2018-10-04 NOTE — Addendum Note (Signed)
Addended by: Jessee Avers on: 10/04/2018 10:27 AM   Modules accepted: Orders

## 2018-10-10 ENCOUNTER — Encounter: Payer: Self-pay | Admitting: Gastroenterology

## 2018-10-10 ENCOUNTER — Ambulatory Visit: Payer: 59 | Admitting: Gastroenterology

## 2018-10-10 VITALS — BP 112/70 | HR 72 | Ht 67.0 in | Wt 206.8 lb

## 2018-10-10 DIAGNOSIS — R1032 Left lower quadrant pain: Secondary | ICD-10-CM

## 2018-10-10 DIAGNOSIS — R197 Diarrhea, unspecified: Secondary | ICD-10-CM | POA: Diagnosis not present

## 2018-10-10 MED ORDER — NA SULFATE-K SULFATE-MG SULF 17.5-3.13-1.6 GM/177ML PO SOLN: 1.0000 | Freq: Once | ORAL | 0 refills | Status: AC

## 2018-10-10 MED ORDER — RIFAXIMIN 550 MG PO TABS: 550.0000 mg | ORAL_TABLET | Freq: Three times a day (TID) | ORAL | 0 refills | Status: AC

## 2018-10-10 MED FILL — SUPREP BOWEL PREP KIT: 17.5-3.13-1 | 1 days supply | Qty: 354 | Fill #0

## 2018-10-10 NOTE — Progress Notes (Signed)
    History of Present Illness: This is a 53 year old male returning for evaluation of urgent diarrhea, occasional constipation, urgency and LLQ pain. GI pathogen panel negative. Fecal elastase normal. Low FODMAP not tried. Dicyclomine not helpful.  Symptoms persist and he relates they are unchanged since his last office visit. See 09/06/2018 office note.  Current Medications, Allergies, Past Medical History, Past Surgical History, Family History and Social History were reviewed in [employer] record.  Physical Exam: General: Well developed, well nourished, no acute distress Head: Normocephalic and atraumatic Eyes:  sclerae anicteric, EOMI Ears: Normal auditory acuity Mouth: No deformity or lesions Lungs: Clear throughout to auscultation Heart: Regular rate and rhythm; no murmurs, rubs or bruits Abdomen: Soft, non tender and non distended. No masses, hepatosplenomegaly or hernias noted. Normal Bowel sounds Rectal: Deferred to colonoscopy Musculoskeletal: Symmetrical with no gross deformities  Pulses:  Normal pulses noted Extremities: No clubbing, cyanosis, edema or deformities noted Neurological: Alert oriented x 4, grossly nonfocal Psychological:  Alert and cooperative. Normal mood and affect   Assessment and Recommendations:  1. Diarrhea, LLQ pain. Presumed IBS however not responding to current medications and diet.  Rule out microscopic colitis, IBD and other disorders.  Continue current medications.  Avoid foods that trigger symptoms.  Schedule colonoscopy. The risks (including bleeding, perforation, infection, missed lesions, medication reactions and possible hospitalization or surgery if complications occur), benefits, and alternatives to colonoscopy with possible biopsy and possible polypectomy were discussed with the patient and they consent to proceed. Begin a low FODMAP diet today then Xifaxan 550 mg po tid for 14d.

## 2018-10-10 NOTE — Patient Instructions (Signed)
You have been given a low fod-map diet to follow.  We have sent your demographic information and a prescription for Xifaxan to [facility]. This pharmacy is able to get medication approved through insurance and get you the lowest copay possible. If you have not heard from them within 1 week, please call our office at [phone] to let us know.  You have been scheduled for a colonoscopy. Please follow written instructions given to you at your visit today.  Please pick up your prep supplies at the pharmacy within the next 1-3 days. If you use inhalers (even only as needed), please bring them with you on the day of your procedure. Your physician has requested that you go to www.startemmi.com and enter the access code given to you at your visit today. This web site gives a general overview about your procedure. However, you should still follow specific instructions given to you by our office regarding your preparation for the procedure.  Thank you for choosing me and [facility].  Venita Lick. Pleas Koch., MD., Clementeen Graham

## 2018-10-13 ENCOUNTER — Other Ambulatory Visit: Payer: Self-pay | Admitting: Internal Medicine

## 2018-10-13 MED FILL — HYDROCODON-APAP 10-325: 10-325 | 30 days supply | Qty: 120 | Fill #0

## 2018-10-13 MED FILL — AMPHETAMINE-DEXTROAMPHETAMI: 10 | 30 days supply | Qty: 60 | Fill #0

## 2018-10-13 MED FILL — TRUVADA 200-300 MG TABS: 200-300 | 30 days supply | Qty: 30 | Fill #3

## 2018-10-16 ENCOUNTER — Encounter: Payer: Self-pay | Admitting: Gastroenterology

## 2018-10-16 ENCOUNTER — Telehealth: Payer: Self-pay

## 2018-10-16 NOTE — Telephone Encounter (Signed)
Received fax from [employer] that Xifaxan was approved for a maximum of 1 refill from 10/10/18-01/02/19. [facility] also sent fax of the approval and they will reach out to patient for shipment.

## 2018-10-19 DIAGNOSIS — F4323 Adjustment disorder with mixed anxiety and depressed mood: Secondary | ICD-10-CM | POA: Diagnosis not present

## 2018-10-23 ENCOUNTER — Ambulatory Visit (AMBULATORY_SURGERY_CENTER): Payer: 59 | Admitting: Gastroenterology

## 2018-10-23 ENCOUNTER — Encounter: Payer: Self-pay | Admitting: Gastroenterology

## 2018-10-23 VITALS — BP 91/49 | HR 63 | Temp 98.9°F | Resp 10 | Ht 67.0 in | Wt 206.0 lb

## 2018-10-23 DIAGNOSIS — R197 Diarrhea, unspecified: Secondary | ICD-10-CM

## 2018-10-23 DIAGNOSIS — R1032 Left lower quadrant pain: Secondary | ICD-10-CM | POA: Diagnosis not present

## 2018-10-23 DIAGNOSIS — K6389 Other specified diseases of intestine: Secondary | ICD-10-CM

## 2018-10-23 DIAGNOSIS — Z1211 Encounter for screening for malignant neoplasm of colon: Secondary | ICD-10-CM | POA: Diagnosis not present

## 2018-10-23 MED ORDER — SODIUM CHLORIDE 0.9 % IV SOLN: 500.0000 mL | Freq: Once | INTRAVENOUS | Status: DC

## 2018-10-23 NOTE — Patient Instructions (Signed)
YOU HAD AN ENDOSCOPIC PROCEDURE TODAY AT THE [facility]:   Refer to the procedure report that was given to you for any specific questions about what was found during the examination.  If the procedure report does not answer your questions, please call your gastroenterologist to clarify.  If you requested that your care partner not be given the details of your procedure findings, then the procedure report has been included in a sealed envelope for you to review at your convenience later.  YOU SHOULD EXPECT: Some feelings of bloating in the abdomen. Passage of more gas than usual.  Walking can help get rid of the air that was put into your GI tract during the procedure and reduce the bloating. If you had a lower endoscopy (such as a colonoscopy or flexible sigmoidoscopy) you may notice spotting of blood in your stool or on the toilet paper. If you underwent a bowel prep for your procedure, you may not have a normal bowel movement for a few days.  Please Note:  You might notice some irritation and congestion in your nose or some drainage.  This is from the oxygen used during your procedure.  There is no need for concern and it should clear up in a day or so.  SYMPTOMS TO REPORT IMMEDIATELY:   Following lower endoscopy (colonoscopy or flexible sigmoidoscopy):  Excessive amounts of blood in the stool  Significant tenderness or worsening of abdominal pains  Swelling of the abdomen that is new, acute  Fever of 100F or higher  For urgent or emergent issues, a gastroenterologist can be reached at any hour by calling [phone].   DIET:  We do recommend a small meal at first, but then you may proceed to your regular diet.  Drink plenty of fluids but you should avoid alcoholic beverages for 24 hours.  ACTIVITY:  You should plan to take it easy for the rest of today and you should NOT DRIVE or use heavy machinery until tomorrow (because of the sedation medicines used during the test).     FOLLOW UP: Our staff will call the number listed on your records the next business day following your procedure to check on you and address any questions or concerns that you may have regarding the information given to you following your procedure. If we do not reach you, we will leave a message.  However, if you are feeling well and you are not experiencing any problems, there is no need to return our call.  We will assume that you have returned to your regular daily activities without incident.  If any biopsies were taken you will be contacted by phone or by letter within the next 1-3 weeks.  Please call us at [phone] if you have not heard about the biopsies in 3 weeks.    SIGNATURES/CONFIDENTIALITY: You and/or your care partner have signed paperwork which will be entered into your electronic medical record.  These signatures attest to the fact that that the information above on your After Visit Summary has been reviewed and is understood.  Full responsibility of the confidentiality of this discharge information lies with you and/or your care-partner.    Handouts were given to your care partner on hemorrhoids and diverticulosis. You may resume your current medications today. Await biopsy results. Please call if any questions or concerns.

## 2018-10-23 NOTE — Progress Notes (Signed)
No problems noted in the recovery room. maw 

## 2018-10-23 NOTE — Progress Notes (Signed)
To PACU< VSS. Report to [email or web address].tb 

## 2018-10-23 NOTE — Op Note (Signed)
[facility] Patient Name: Mark Mcgee Procedure Date: 10/23/2018 9:13 AM MRN: [id number] Endoscopist: Meryl Dare , MD Age: 52 Referring MD:  Date of Birth: Jan 23, 1966 Gender: Male Account #: [id number] Procedure:                Colonoscopy Indications:              Chronic diarrhea, Clinically significant diarrhea                            of unexplained origin Medicines:                Monitored Anesthesia Care Procedure:                Pre-Anesthesia Assessment:                           - Prior to the procedure, a History and Physical                            was performed, and patient medications and                            allergies were reviewed. The patient's tolerance of                            previous anesthesia was also reviewed. The risks                            and benefits of the procedure and the sedation                            options and risks were discussed with the patient.                            All questions were answered, and informed consent                            was obtained. Prior Anticoagulants: The patient has                            taken no previous anticoagulant or antiplatelet                            agents. ASA Grade Assessment: II - A patient with                            mild systemic disease. After reviewing the risks                            and benefits, the patient was deemed in                            satisfactory condition to undergo the procedure.  After obtaining informed consent, the colonoscope                            was passed under direct vision. Throughout the                            procedure, the patient's blood pressure, pulse, and                            oxygen saturations were monitored continuously. The                            Colonoscope was introduced through the anus and                            advanced to the the terminal ileum, with                             identification of the appendiceal orifice and IC                            valve. The terminal ileum, ileocecal valve,                            appendiceal orifice, and rectum were photographed.                            The quality of the bowel preparation was good. The                            colonoscopy was performed without difficulty. The                            patient tolerated the procedure well. Scope In: 9:16:21 AM Scope Out: 9:36:15 AM Scope Withdrawal Time: 0 hours 16 minutes 19 seconds  Total Procedure Duration: 0 hours 19 minutes 54 seconds  Findings:                 The perianal and digital rectal examinations were                            normal.                           The terminal ileum appeared normal.                           A few medium-mouthed diverticula were found in the                            left colon.                           Internal hemorrhoids were found during  retroflexion. The hemorrhoids were small and Grade                            I (internal hemorrhoids that do not prolapse).                           The entire examined colon otherwise appeared normal                            on direct and retroflexion views. Biopsies for                            histology were taken with a cold forceps from the                            entire colon for evaluation of microscopic colitis. Complications:            No immediate complications. Estimated blood loss:                            None. Estimated Blood Loss:     Estimated blood loss: none. Impression:               - The examined portion of the terminal ileum was                            normal.                           - Mild left colon diverticulosis.                           - Internal hemorrhoids.                           - The entire examined colon was otherwise normal on                            direct and  retroflexion views. Recommendation:           - Repeat colonoscopy in 10 years for screening                            purposes.                           - Patient has a contact number available for                            emergencies. The signs and symptoms of potential                            delayed complications were discussed with the                            patient. Return to normal activities tomorrow.  Written discharge instructions were provided to the                            patient.                           - Resume previous diet.                           - Continue present medications.                           - Await pathology results.                           - Return to GI office in 1 month. Meryl DareMalcolm T Earlie Arciga, MD 10/23/2018 9:48:25 AM This report has been signed electronically.

## 2018-10-24 ENCOUNTER — Telehealth: Payer: Self-pay

## 2018-10-24 NOTE — Telephone Encounter (Signed)

## 2018-11-01 ENCOUNTER — Encounter: Payer: Self-pay | Admitting: Gastroenterology

## 2018-11-02 MED FILL — ALPRAZolam 2 MG TABS: 2 | 30 days supply | Qty: 90 | Fill #0

## 2018-11-02 MED FILL — TADALAFIL 5 MG TABS: 5 | 90 days supply | Qty: 90 | Fill #2

## 2018-11-14 ENCOUNTER — Telehealth: Payer: Self-pay | Admitting: Gastroenterology

## 2018-11-14 NOTE — Telephone Encounter (Signed)
Wants to discuss next steps for treatments after from having colonoscopy. Would not give much info.

## 2018-11-14 NOTE — Telephone Encounter (Signed)
Questran 1 packet bid to tid 

## 2018-11-14 NOTE — Telephone Encounter (Signed)
Patient reports that he is having continued diarrhea. He has been on xifaxan.  He is having urgency and unable to control the diarrhea at times.  Xifaxan has not helped. Imodium, robinul, dicyclomine, levsin do not control it.  He is asking for any additional help or considerations

## 2018-11-15 MED ORDER — CHOLESTYRAMINE 4 G PO PACK: 4.0000 g | PACK | Freq: Three times a day (TID) | ORAL | 3 refills | Status: DC

## 2018-11-15 MED FILL — HYDROCODON-APAP 10-325: 10-325 | 30 days supply | Qty: 120 | Fill #0

## 2018-11-15 MED FILL — CHOLESTYRAMINE PACKET: 4 | 30 days supply | Qty: 90 | Fill #0

## 2018-11-15 MED FILL — TRUVADA 200-300 MG TABS: 200-300 | 30 days supply | Qty: 30 | Fill #4

## 2018-11-15 MED FILL — AMPHETAMINE-DEXTROAMPHETAMI: 10 | 30 days supply | Qty: 60 | Fill #0

## 2018-11-15 NOTE — Telephone Encounter (Signed)
Pt is returning your call

## 2018-11-15 NOTE — Telephone Encounter (Signed)
Patient notified

## 2018-11-15 NOTE — Telephone Encounter (Signed)
Left message for patient to call back rx sent 

## 2018-11-20 ENCOUNTER — Telehealth: Payer: Self-pay | Admitting: Gastroenterology

## 2018-11-20 NOTE — Telephone Encounter (Signed)
Pt called to inform that the phone # for FMLA that you gave him is not working. I gave him another phone [phone]. He still would like a call back from you.

## 2018-11-20 NOTE — Telephone Encounter (Signed)
Patient was able to reach [employer] and left a message.  He will call me back for any additional questions or concerns

## 2018-11-23 DIAGNOSIS — F4323 Adjustment disorder with mixed anxiety and depressed mood: Secondary | ICD-10-CM | POA: Diagnosis not present

## 2018-11-28 ENCOUNTER — Encounter: Payer: Self-pay | Admitting: Internal Medicine

## 2018-11-28 ENCOUNTER — Other Ambulatory Visit (INDEPENDENT_AMBULATORY_CARE_PROVIDER_SITE_OTHER): Payer: 59

## 2018-11-28 ENCOUNTER — Ambulatory Visit (INDEPENDENT_AMBULATORY_CARE_PROVIDER_SITE_OTHER): Payer: 59 | Admitting: Internal Medicine

## 2018-11-28 VITALS — BP 98/64 | HR 78 | Temp 97.9°F | Ht 67.0 in | Wt 217.0 lb

## 2018-11-28 DIAGNOSIS — I10 Essential (primary) hypertension: Secondary | ICD-10-CM | POA: Diagnosis not present

## 2018-11-28 DIAGNOSIS — Z0000 Encounter for general adult medical examination without abnormal findings: Secondary | ICD-10-CM | POA: Diagnosis not present

## 2018-11-28 DIAGNOSIS — F988 Other specified behavioral and emotional disorders with onset usually occurring in childhood and adolescence: Secondary | ICD-10-CM

## 2018-11-28 DIAGNOSIS — Z5181 Encounter for therapeutic drug level monitoring: Secondary | ICD-10-CM

## 2018-11-28 DIAGNOSIS — R197 Diarrhea, unspecified: Secondary | ICD-10-CM

## 2018-11-28 DIAGNOSIS — F410 Panic disorder [episodic paroxysmal anxiety] without agoraphobia: Secondary | ICD-10-CM | POA: Diagnosis not present

## 2018-11-28 LAB — BASIC METABOLIC PANEL
BUN: 17 mg/dL (ref 6–23)
CO2: 28 mEq/L (ref 19–32)
Calcium: 9.7 mg/dL (ref 8.4–10.5)
Chloride: 104 mEq/L (ref 96–112)
Creatinine, Ser: 0.99 mg/dL (ref 0.40–1.50)
GFR: 79.49 mL/min (ref >60.00)
Glucose, Bld: 96 mg/dL (ref 70–99)
Potassium: 5 mEq/L (ref 3.5–5.1)
SODIUM: 140 meq/L (ref 135–145)

## 2018-11-28 LAB — HEPATIC FUNCTION PANEL
ALK PHOS: 79 U/L (ref 39–117)
ALT: 35 U/L (ref 0–53)
AST: 21 U/L (ref 0–37)
Albumin: 4.9 g/dL (ref 3.5–5.2)
Bilirubin, Direct: 0.1 mg/dL (ref 0.0–0.3)
Total Bilirubin: 0.4 mg/dL (ref 0.2–1.2)
Total Protein: 7 g/dL (ref 6.0–8.3)

## 2018-11-28 LAB — CBC WITH DIFFERENTIAL/PLATELET
Basophils Absolute: 0 10*3/uL (ref 0.0–0.1)
Basophils Relative: 0.5 % (ref 0.0–3.0)
Eosinophils Absolute: 0.1 10*3/uL (ref 0.0–0.7)
Eosinophils Relative: 1.7 % (ref 0.0–5.0)
HCT: 41.3 % (ref 39.0–52.0)
Hemoglobin: 13.3 g/dL (ref 13.0–17.0)
Lymphocytes Relative: 14.8 % (ref 12.0–46.0)
Lymphs Abs: 1.2 10*3/uL (ref 0.7–4.0)
MCHC: 32.2 g/dL (ref 30.0–36.0)
MCV: 77.7 fl — ABNORMAL LOW (ref 78.0–100.0)
MONO ABS: 0.6 10*3/uL (ref 0.1–1.0)
Monocytes Relative: 7.8 % (ref 3.0–12.0)
Neutro Abs: 6 10*3/uL (ref 1.4–7.7)
Neutrophils Relative %: 75.2 % (ref 43.0–77.0)
Platelets: 249 10*3/uL (ref 150.0–400.0)
RBC: 5.32 Mil/uL (ref 4.22–5.81)
RDW: 15.2 % (ref 11.5–15.5)
WBC: 8 10*3/uL (ref 4.0–10.5)

## 2018-11-28 LAB — LIPID PANEL
Cholesterol: 192 mg/dL (ref 0–200)
HDL: 45.3 mg/dL (ref >39.00)
LDL Cholesterol: 114 mg/dL — ABNORMAL HIGH (ref 0–99)
NonHDL: 146.7
Total CHOL/HDL Ratio: 4
Triglycerides: 165 mg/dL — ABNORMAL HIGH (ref 0.0–149.0)
VLDL: 33 mg/dL (ref 0.0–40.0)

## 2018-11-28 LAB — TESTOSTERONE: TESTOSTERONE: 272.54 ng/dL — AB (ref 300.00–890.00)

## 2018-11-28 LAB — PSA: PSA: 0.47 ng/mL (ref 0.10–4.00)

## 2018-11-28 LAB — TSH: TSH: 2.16 u[IU]/mL (ref 0.35–4.50)

## 2018-11-28 MED ORDER — AMPHETAMINE-DEXTROAMPHETAMINE 10 MG PO TABS: 10.0000 mg | ORAL_TABLET | Freq: Two times a day (BID) | ORAL | 0 refills | Status: DC

## 2018-11-28 MED ORDER — AMLODIPINE-OLMESARTAN 10-40 MG PO TABS: 1.0000 | ORAL_TABLET | Freq: Every day | ORAL | 3 refills | Status: DC

## 2018-11-28 MED ORDER — HYDROCODONE-ACETAMINOPHEN 10-325 MG PO TABS: ORAL_TABLET | ORAL | 0 refills | Status: DC

## 2018-11-28 NOTE — Assessment & Plan Note (Signed)
Azor 

## 2018-11-28 NOTE — Progress Notes (Signed)
Subjective:  Patient ID: Mark Mcgee, male    DOB: Feb 25, 1966  Age: 53 y.o. MRN: [id number]  CC: No chief complaint on file.   HPI Mark Mcgee presents for a well exam F/u IBS - Xifaxan did not help F/u LBP,abd pain and ADD   Outpatient Medications Prior to Visit  Medication Sig Dispense Refill  . acyclovir (ZOVIRAX) 400 MG tablet TAKE 1 TABLET BY MOUTH 4 TIMES DAILY 120 tablet 1  . alprazolam (XANAX) 2 MG tablet TAKE 1 TABLET BY MOUTH 3 TIMES DAILY 90 tablet 1  . amLODipine-olmesartan (AZOR) 10-40 MG tablet TAKE 1 TABLET BY MOUTH ONCE DAILY 90 tablet 3  . amphetamine-dextroamphetamine (ADDERALL) 10 MG tablet Take 1 tablet (10 mg total) by mouth 2 (two) times daily with breakfast and lunch. 60 tablet 0  . b complex vitamins tablet Take 1 tablet by mouth daily.      . Cholecalciferol 1000 UNITS tablet Take 2,000 Units by mouth daily.      . cholestyramine (QUESTRAN) 4 g packet Take 1 packet (4 g total) by mouth 3 (three) times daily with meals. 90 each 3  . dicyclomine (BENTYL) 20 MG tablet Take 1 tablet (20 mg total) by mouth 3 (three) times daily before meals. 90 tablet 1  . diphenoxylate-atropine (LOMOTIL) 2.5-0.025 MG tablet TAKE 1 OR 2 TABLETS BY MOUTH 4 TIMES DAILY AS NEEDED FOR DIARRHEA OR LOOSE STOOLS MAX OF 8 TABLETS PER DAY 60 tablet 3  . doxycycline (VIBRA-TABS) 100 MG tablet Take 1 tablet (100 mg total) by mouth 2 (two) times daily. 28 tablet 0  . Eluxadoline (VIBERZI) 100 MG TABS Take 1 tablet (100 mg total) by mouth 2 (two) times daily. 180 tablet 1  . emtricitabine-tenofovir (TRUVADA) 200-300 MG tablet Take 1 tablet by mouth daily. 90 tablet 1  . Flaxseed, Linseed, (FLAX SEEDS) POWD Take by mouth 2 (two) times daily.      [name] glycopyrrolate (ROBINUL) 2 MG tablet Take 1 tablet (2 mg total) by mouth 2 (two) times daily. 180 tablet 0  . halobetasol (ULTRAVATE) 0.05 % ointment Apply topically 2 (two) times daily. 50 g 3  . HYDROcodone-acetaminophen (NORCO) 10-325 MG  tablet TAKE 1 TABLET BY MOUTH EVERY 6 HOURS AS NEEDED FOR PAIN 120 tablet 0  . ibuprofen (ADVIL,MOTRIN) 400 MG tablet TAKE 1 TABLET BY MOUTH TWICE A DAY AS NEEDED 60 tablet 2  . ipratropium (ATROVENT) 0.03 % nasal spray Place 2 sprays into both nostrils 2 (two) times daily. 30 mL 5  . loperamide (IMODIUM) 2 MG capsule Take 2 mg by mouth as needed for diarrhea or loose stools.    . Multiple Vitamin (MULTIVITAMIN) capsule Take by mouth.    . ondansetron (ZOFRAN) 4 MG tablet Take 1 tablet (4 mg total) by mouth every 8 (eight) hours as needed for nausea or vomiting. 30 tablet 0  . saccharomyces boulardii (FLORASTOR) 250 MG capsule Take 1 capsule (250 mg total) by mouth 2 (two) times daily. 60 capsule 3  . tadalafil (CIALIS) 5 MG tablet TAKE 1 TABLET BY MOUTH ONCE DAILY 90 tablet 3  . testosterone cypionate (DEPOTESTOSTERONE CYPIONATE) 200 MG/ML injection Inject 1 mL (200 mg total) into the muscle every 14 (fourteen) days. 10 mL 5  . triamcinolone (KENALOG) 0.1 % paste Use as directed 1 application in the mouth or throat 3 (three) times daily. 5 g 3  . triamcinolone ointment (KENALOG) 0.1 % Apply 1 application topically 2 (two) times daily. 80 g  2  . Vitamin D, Ergocalciferol, (DRISDOL) 50000 units CAPS capsule Take 1 capsule (50,000 Units total) by mouth once a week. 8 capsule 0   No facility-administered medications prior to visit.     ROS: Review of Systems  Constitutional: Positive for fatigue. Negative for appetite change and unexpected weight change.  HENT: Negative for congestion, nosebleeds, sneezing, sore throat and trouble swallowing.   Eyes: Negative for itching and visual disturbance.  Respiratory: Negative for cough.   Cardiovascular: Negative for chest pain, palpitations and leg swelling.  Gastrointestinal: Positive for abdominal distention, abdominal pain and diarrhea. Negative for blood in stool and nausea.  Genitourinary: Negative for frequency and hematuria.  Musculoskeletal:  Positive for arthralgias and back pain. Negative for gait problem, joint swelling and neck pain.  Skin: Negative for rash.  Neurological: Negative for dizziness, tremors, speech difficulty and weakness.  Psychiatric/Behavioral: Positive for decreased concentration. Negative for agitation, dysphoric mood, sleep disturbance and suicidal ideas. The patient is nervous/anxious.     Objective:  BP 98/64 (BP Location: Left Arm, Patient Position: Sitting, Cuff Size: Large)   Pulse 78   Temp 97.9 F (36.6 C) (Oral)   Ht 5\' 7"  (1.702 m)   Wt 217 lb (98.4 kg)   SpO2 97%   BMI 33.99 kg/m   BP Readings from Last 3 Encounters:  11/28/18 98/64  10/23/18 (!) 91/49  10/10/18 112/70    Wt Readings from Last 3 Encounters:  11/28/18 217 lb (98.4 kg)  10/23/18 206 lb (93.4 kg)  10/10/18 206 lb 12.8 oz (93.8 kg)    Physical Exam Constitutional:      General: He is not in acute distress.    Appearance: He is well-developed.     Comments: NAD  Eyes:     Conjunctiva/sclera: Conjunctivae normal.     Pupils: Pupils are equal, round, and reactive to light.  Neck:     Musculoskeletal: Normal range of motion.     Thyroid: No thyromegaly.     Vascular: No JVD.  Cardiovascular:     Rate and Rhythm: Normal rate and regular rhythm.     Heart sounds: Normal heart sounds. No murmur. No friction rub. No gallop.   Pulmonary:     Effort: Pulmonary effort is normal. No respiratory distress.     Breath sounds: Normal breath sounds. No wheezing or rales.  Chest:     Chest wall: No tenderness.  Abdominal:     General: Bowel sounds are normal. There is no distension.     Palpations: Abdomen is soft. There is no mass.     Tenderness: There is no abdominal tenderness. There is no guarding or rebound.  Musculoskeletal: Normal range of motion.        General: No tenderness.  Lymphadenopathy:     Cervical: No cervical adenopathy.  Skin:    General: Skin is warm and dry.     Findings: No rash.    Neurological:     Mental Status: He is alert and oriented to person, place, and time.     Cranial Nerves: No cranial nerve deficit.     Motor: No abnormal muscle tone.     Coordination: Coordination normal.     Gait: Gait normal.     Deep Tendon Reflexes: Reflexes are normal and symmetric.  Psychiatric:        Behavior: Behavior normal.        Thought Content: Thought content normal.        Judgment: Judgment normal.  sad Rectal - per Dr Russella DarStark  Lab Results  Component Value Date   WBC 6.3 08/31/2018   HGB 13.4 08/31/2018   HCT 41.6 08/31/2018   PLT 288.0 08/31/2018   GLUCOSE 96 08/31/2018   CHOL 141 11/24/2017   TRIG 155.0 (H) 11/24/2017   HDL 38.20 (L) 11/24/2017   LDLDIRECT 127.1 08/30/2012   LDLCALC 72 11/24/2017   ALT 40 08/31/2018   AST 20 08/31/2018   NA 138 08/31/2018   K 4.1 08/31/2018   CL 102 08/31/2018   CREATININE 1.23 08/31/2018   BUN 18 08/31/2018   CO2 26 08/31/2018   TSH 2.58 11/24/2017   PSA 0.66 11/24/2017    Ct Abdomen Pelvis Wo Contrast  Result Date: 03/29/2017 CLINICAL DATA:  53 year old male with left abdominal pain and vomiting for 1 week. EXAM: CT ABDOMEN AND PELVIS WITHOUT CONTRAST TECHNIQUE: Multidetector CT imaging of the abdomen and pelvis was performed following the standard protocol without IV contrast. COMPARISON:  11/27/2010 and prior CTs FINDINGS: Please note that parenchymal abnormalities may be missed without intravenous contrast. Lower chest: No acute abnormality Hepatobiliary: The liver and gallbladder are unremarkable. No biliary dilatation. Pancreas: Unremarkable Spleen: Unremarkable Adrenals/Urinary Tract: The kidneys, adrenal glands and bladder are unremarkable. Stomach/Bowel: Stomach is within normal limits. Appendix appears normal. No evidence of bowel wall thickening, distention, or inflammatory changes. New A large amount of stool in the rectum is noted. Vascular/Lymphatic: No significant vascular findings are present. No enlarged  abdominal or pelvic lymph nodes. Reproductive: Prostate is unremarkable. Other: No abdominal wall hernia or abnormality. No abdominopelvic ascites. Musculoskeletal: No acute or significant osseous findings. IMPRESSION: No acute abnormality. Large amount of rectal stool without other significant abnormality. Electronically Signed   By: Harmon PierJeffrey  Hu M.D.   On: 03/29/2017 14:28    Assessment & Plan:   There are no diagnoses linked to this encounter.   No orders of the defined types were placed in this encounter.    Follow-up: No follow-ups on file.  Sonda PrimesAlex , MD

## 2018-11-28 NOTE — Assessment & Plan Note (Signed)
Adderall

## 2018-11-28 NOTE — Assessment & Plan Note (Signed)
We discussed age appropriate health related issues, including available/recomended screening tests and vaccinations. We discussed a need for adhering to healthy diet and exercise. Labs were ordered to be later reviewed . All questions were answered.   

## 2018-11-28 NOTE — Assessment & Plan Note (Signed)
[place]

## 2018-11-28 NOTE — Assessment & Plan Note (Signed)
On Cholestyramine  Xifaxan did not help

## 2018-11-28 NOTE — Assessment & Plan Note (Signed)
Chronic, severe Xanax prn  Potential benefits of a long term benzodiazepines  use as well as potential risks  and complications were explained to the patient and were aknowledged.

## 2018-11-30 LAB — HIV-1 RNA QUANT-NO REFLEX-BLD
HIV 1 RNA QUANT: NOT DETECTED {copies}/mL
HIV-1 RNA Quant, Log: <1.3 Log copies/mL

## 2018-12-03 DIAGNOSIS — R0789 Other chest pain: Secondary | ICD-10-CM

## 2018-12-03 DIAGNOSIS — R112 Nausea with vomiting, unspecified: Secondary | ICD-10-CM

## 2018-12-07 MED FILL — ALPRAZolam 2 MG TABS: 2 | 30 days supply | Qty: 90 | Fill #1

## 2018-12-07 MED FILL — TRUVADA 200-300 MG TABS: 200-300 | 30 days supply | Qty: 30 | Fill #5

## 2018-12-11 MED FILL — CHOLESTYRAMINE PACKET: 4 | 30 days supply | Qty: 90 | Fill #1

## 2018-12-13 MED FILL — HYDROCODON-APAP 10-325: 10-325 | 30 days supply | Qty: 120 | Fill #0

## 2018-12-13 MED FILL — AMPHETAMINE-DEXTROAMPHETAMI: 10 | 30 days supply | Qty: 60 | Fill #0

## 2018-12-14 DIAGNOSIS — Z0279 Encounter for issue of other medical certificate: Secondary | ICD-10-CM

## 2018-12-14 MED ORDER — ONDANSETRON HCL 4 MG PO TABS: 4.0000 mg | ORAL_TABLET | Freq: Three times a day (TID) | ORAL | 0 refills | Status: DC | PRN

## 2018-12-14 NOTE — Telephone Encounter (Signed)
Forms have been faxed to [employer], Copy sent to scan &Charged for.   Patient informed and will pick up original.

## 2018-12-14 NOTE — Telephone Encounter (Signed)
Forms have been completed, Place on providers desk to sign.

## 2018-12-26 MED FILL — AMLODIPINE-OLMESARTAN 10-40: 10-40 | 90 days supply | Qty: 90 | Fill #3

## 2018-12-26 MED FILL — ONDANSETRON HCL 4 MG TABLET: 4 | 10 days supply | Qty: 30 | Fill #0

## 2018-12-27 ENCOUNTER — Other Ambulatory Visit: Payer: Self-pay | Admitting: Internal Medicine

## 2018-12-27 MED FILL — TRIAMCINOLONE 0.1% OINTMENT: 0.1 | 20 days supply | Qty: 80 | Fill #1

## 2018-12-27 MED FILL — ACYCLOVIR 400 MG TABLET: 400 | 30 days supply | Qty: 120 | Fill #1

## 2019-01-04 ENCOUNTER — Other Ambulatory Visit: Payer: Self-pay | Admitting: Pharmacist

## 2019-01-04 ENCOUNTER — Other Ambulatory Visit: Payer: Self-pay | Admitting: Internal Medicine

## 2019-01-04 MED ORDER — EMTRICITABINE-TENOFOVIR DF 200-300 MG PO TABS: 1.0000 | ORAL_TABLET | Freq: Every day | ORAL | 1 refills | Status: DC

## 2019-01-11 MED FILL — TRUVADA 200-300 MG TABS: 200-300 | 30 days supply | Qty: 30 | Fill #0

## 2019-01-11 MED FILL — TESTOSTERONE CYP 200 MG/ML: 200 | 28 days supply | Qty: 2 | Fill #0

## 2019-01-11 MED FILL — ALPRAZolam 2 MG TABS: 2 | 30 days supply | Qty: 90 | Fill #0

## 2019-01-20 ENCOUNTER — Other Ambulatory Visit: Payer: Self-pay | Admitting: Internal Medicine

## 2019-01-22 ENCOUNTER — Encounter (HOSPITAL_COMMUNITY): Payer: Self-pay | Admitting: Emergency Medicine

## 2019-01-22 ENCOUNTER — Other Ambulatory Visit: Payer: Self-pay

## 2019-01-22 ENCOUNTER — Ambulatory Visit: Payer: Self-pay | Admitting: *Deleted

## 2019-01-22 ENCOUNTER — Emergency Department (HOSPITAL_COMMUNITY): Payer: 59

## 2019-01-22 ENCOUNTER — Emergency Department (HOSPITAL_COMMUNITY)
Admission: EM | Admit: 2019-01-22 | Discharge: 2019-01-22 | Discharge status: Against Medical Advice | Payer: 59 | Attending: Emergency Medicine | Admitting: Emergency Medicine

## 2019-01-22 DIAGNOSIS — R6889 Other general symptoms and signs: Secondary | ICD-10-CM

## 2019-01-22 DIAGNOSIS — Z20828 Contact with and (suspected) exposure to other viral communicable diseases: Secondary | ICD-10-CM | POA: Insufficient documentation

## 2019-01-22 DIAGNOSIS — B9789 Other viral agents as the cause of diseases classified elsewhere: Secondary | ICD-10-CM | POA: Diagnosis not present

## 2019-01-22 DIAGNOSIS — Z79899 Other long term (current) drug therapy: Secondary | ICD-10-CM | POA: Insufficient documentation

## 2019-01-22 DIAGNOSIS — I10 Essential (primary) hypertension: Secondary | ICD-10-CM | POA: Diagnosis not present

## 2019-01-22 DIAGNOSIS — Z5320 Procedure and treatment not carried out because of patient's decision for unspecified reasons: Secondary | ICD-10-CM | POA: Diagnosis not present

## 2019-01-22 DIAGNOSIS — J069 Acute upper respiratory infection, unspecified: Secondary | ICD-10-CM | POA: Insufficient documentation

## 2019-01-22 DIAGNOSIS — R05 Cough: Secondary | ICD-10-CM | POA: Diagnosis not present

## 2019-01-22 DIAGNOSIS — R509 Fever, unspecified: Secondary | ICD-10-CM | POA: Diagnosis present

## 2019-01-22 DIAGNOSIS — Z20822 Contact with and (suspected) exposure to covid-19: Secondary | ICD-10-CM

## 2019-01-22 MED ORDER — ALBUTEROL SULFATE HFA 108 (90 BASE) MCG/ACT IN AERS: 6.0000 | INHALATION_SPRAY | Freq: Once | RESPIRATORY_TRACT | Status: DC

## 2019-01-22 MED ORDER — ALBUTEROL SULFATE HFA 108 (90 BASE) MCG/ACT IN AERS
2.0000 | INHALATION_SPRAY | Freq: Once | RESPIRATORY_TRACT | Status: AC
  Administered 2019-01-22: 2 via RESPIRATORY_TRACT
  Filled 2019-01-22: qty 6.7

## 2019-01-22 MED ORDER — AEROCHAMBER Z-STAT PLUS/MEDIUM MISC
1.0000 | Freq: Once | Status: AC
  Administered 2019-01-22: 1
  Filled 2019-01-22: qty 1

## 2019-01-22 NOTE — ED Notes (Signed)
Patient approached Estate agent and stated he wanted to leave and did not want to wait for discharge instructions. Patient ambulated from [facility] without assistance.

## 2019-01-22 NOTE — ED Notes (Signed)
Patient called on call bell requesting updated from PA. This RN, told PA Claudia. PA told this RN that she had already discussed the patient's discharge with him, provided his chest xray was clear. PA told this RN she was working on the patient's discharge instructions.

## 2019-01-22 NOTE — Progress Notes (Cosign Needed)
Attempted to call patient x 3 to reiterate plan of care at discharge since he eloped without his AVE, but call going straight to voicemail. Voicemail left. I have sent PCP message to assist with appropriate outpatient follow up.

## 2019-01-22 NOTE — ED Triage Notes (Signed)
Pt c/o dry cough, SOB, headaches and body ahces for couple days. Reports fevers starting last night. Last took Tylenol hour ago.

## 2019-01-22 NOTE — Telephone Encounter (Signed)
Pt reports temp this am 0600- 99.6, checked again at 7a -100.5,  Few minutes prior to call 101.1.  Reports dry cough, onset 2 days ago, worsening. Also reports 9/10 headache "Not like a sinus headache, this is different, entire head." States BP 116/77 HR 99. States SOB this am, at rest as well as with exertion, "Even with talking." Voice halting during call at times. Coughing throughout call. No travel, no known direct exposure. 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 [place] called ahead, spoke with Lequita Halt.' Pt given information provided, instructions and phone number to call once arrived at ED. Verbalizes understanding. States will follow disposition.  Reason for Disposition . MODERATE difficulty breathing (e.g., speaks in phrases, SOB even at rest, pulse 100-120)  Answer Assessment - Initial Assessment Questions 1. COVID-19 DIAGNOSIS: "Who made your Coronavirus (COVID-19) diagnosis?" "Was it confirmed by a positive lab test?" If not diagnosed by a HCP, ask "Are there lots of cases (community spread) where you live?" (See public health department website, if unsure)   * MAJOR community spread: high number of cases; numbers of cases are increasing; many people hospitalized.   * MINOR community spread: low number of cases; not increasing; few or no people hospitalized    N/A 2. ONSET: "When did the COVID-19 symptoms start?"      Cough 2 days ago 3. WORST SYMPTOM: "What is your worst symptom?" (e.g., cough, fever, shortness of breath, muscle aches)     Muscle aches, dry cough 4. COUGH: "How bad is the cough?"       Moderate, SOB at rest 5. FEVER: "Do you have a fever?" If so, ask: "What is your temperature, how was it measured, and when did it start?"     101.1 this AM 6. RESPIRATORY STATUS: "Describe your breathing?" (e.g., shortness of breath, wheezing, unable to speak)      SOBat rest, mild, with talking as well 7.  BETTER-SAME-WORSE: "Are you getting better, staying the same or getting worse compared to yesterday?"  If getting worse, ask, "In what way?"     Worse 8. HIGH RISK DISEASE: "Do you have any chronic medical problems?" (e.g., asthma, heart or lung disease, weak immune system, etc.)    Yes  10. OTHER SYMPTOMS: "Do you have any other symptoms?"  (e.g., runny nose, headache, sore throat, loss of smell)      Headache 9/10; entire head, Not like a sinus headache  Protocols used: CORONAVIRUS (COVID-19) DIAGNOSED OR SUSPECTED-A-AH

## 2019-01-22 NOTE — ED Notes (Signed)
Bed: WA12 Expected date:  Expected time:  Means of arrival:  Comments: 

## 2019-01-22 NOTE — ED Provider Notes (Signed)
[facility]-EMERGENCY DEPT Provider Note   CSN: [id number] Arrival date & time: 01/22/19  1208    History   Chief Complaint Chief Complaint  Patient presents with   Fever   Cough   Headache   Shortness of Breath    HPI Mark Mcgee is a 53 y.o. male HTN, chronic diarrhea, depression, anxiety, tremor, GERD is here for evaluation for fever. Up to 101 F orally this morning.  Has had prodrome of dry cough for 2 weeks similar to allergies in the past. This morning his cough was more frequent and forceful, still dry. Associated with headache and body aches. He feels more easily winded and has chest tightness with exertion.  Feels like he has to take deep breaths to breathe well. No frank exertional, pleuritic or positional chest pain. Chronic diarrhea unchanged. Throat is irritated but no frank sore throat. He called his PCP who advised him to come to ED. 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 No sick contacts. No travel. No exposure to confirmed or suspected COVID-19. Took tylenol around 11 am.  No exertional, pleuritic, positional chest pain, congestion, sore throat, sputum, nausea, vomiting, abdominal pain, melena.      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   Therapeutic drug monitoring 08/24/2017   Right arm pain 06/15/2017   Exposure to blood or body fluid 05/18/2017   Arthralgia 07/02/2016   IBS (irritable bowel syndrome) 03/02/2015   Well  adult exam 08/07/2014   Chest pain, atypical 08/31/2012   URI (upper respiratory infection) 05/22/2012   Serologic abnormality 11/15/2011   BPH (benign prostatic hyperplasia) 08/23/2011   Hypogonadism male 12/22/2010   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   LUMP OR MASS IN BREAST 11/04/2010   RENAL COLIC 10/22/2010   Prostatitis 11/10/2009   ANEMIA-UNSPECIFIED 03/10/2009   ABDOMINAL PAIN, LEFT LOWER QUADRANT 03/10/2009   PANIC DISORDER 02/11/2009   Incontinence of feces 02/11/2009   OTHER GENITAL HERPES 08/26/2008   OTHER SPECIFIED DISORDER OF SKIN 08/26/2008   DYSURIA 08/16/2008   LOW BACK PAIN 08/05/2008   BRONCHITIS, ACUTE 06/28/2008   FATIGUE 04/16/2008   FLATULENCE 01/09/2008   INSOMNIA, PERSISTENT 10/10/2007   CHEST WALL PAIN 10/10/2007   Diarrhea 10/10/2007   Anxiety disorder 08/29/2007   HYPERTENSION 08/29/2007   Adjustment disorder with mixed anxiety and depressed mood 06/27/2007    Past Surgical History:  Procedure Laterality Date   MANDIBLE SURGERY     Right        Home Medications    Prior to Admission medications   Medication Sig Start Date End Date Taking? Authorizing Provider  acetaminophen (TYLENOL) 325 MG tablet Take 975 mg by mouth every 6 (six) hours as needed for mild pain or headache.   Yes [provider]  acyclovir (ZOVIRAX) 400 MG tablet TAKE  1 TABLET BY MOUTH 4 TIMES DAILY Patient taking differently: Take 400 mg by mouth 4 (four) times daily as needed (break out). TAKE 1 TABLET BY MOUTH 4 TIMES DAILY 03/03/18  Yes Plotnikov, Georgina Quint, MD  alprazolam (XANAX) 2 MG tablet TAKE 1 TABLET BY MOUTH 3 TIMES DAILY Patient taking differently: Take 2 mg by mouth 3 (three) times daily as needed for sleep or anxiety.  12/28/18  Yes Plotnikov, Georgina Quint, MD  amLODipine-olmesartan (AZOR) 10-40 MG tablet Take 1 tablet by mouth daily. 11/28/18  Yes Plotnikov, Georgina Quint, MD    amphetamine-dextroamphetamine (ADDERALL) 10 MG tablet Take 1 tablet (10 mg total) by mouth 2 (two) times daily with breakfast and lunch. 11/28/18  Yes Plotnikov, Georgina Quint, MD  b complex vitamins tablet Take 1 tablet by mouth daily.     Yes [provider]  Cholecalciferol (VITAMIN D) 50 MCG (2000 UT) tablet Take 2,000 Units by mouth daily.   Yes [provider]  cholestyramine (QUESTRAN) 4 g packet Take 1 packet (4 g total) by mouth 3 (three) times daily with meals. 11/15/18  Yes Meryl Dare, MD  dicyclomine (BENTYL) 20 MG tablet Take 1 tablet (20 mg total) by mouth 3 (three) times daily before meals. 10/04/18  Yes Meryl Dare, MD  diphenoxylate-atropine (LOMOTIL) 2.5-0.025 MG tablet TAKE 1 OR 2 TABLETS BY MOUTH 4 TIMES DAILY AS NEEDED FOR DIARRHEA OR LOOSE STOOLS MAX OF 8 TABLETS PER DAY Patient taking differently: Take 1-2 tablets by mouth 4 (four) times daily as needed for diarrhea or loose stools. TAKE 1 OR 2 TABLETS BY MOUTH 4 TIMES DAILY AS NEEDED FOR DIARRHEA OR LOOSE STOOLS MAX OF 8 TABLETS PER DAY 02/02/17  Yes Plotnikov, Georgina Quint, MD  emtricitabine-tenofovir (TRUVADA) 200-300 MG tablet Take 1 tablet by mouth daily. 01/04/19  Yes Jegede, Olugbemiga E, MD  Flaxseed, Linseed, (FLAX SEEDS) POWD Take by mouth 2 (two) times daily.     Yes [provider]  glycopyrrolate (ROBINUL) 2 MG tablet Take 1 tablet (2 mg total) by mouth 2 (two) times daily. Patient taking differently: Take 2 mg by mouth 2 (two) times daily as needed (spasms).  02/02/17  Yes Plotnikov, Georgina Quint, MD  halobetasol (ULTRAVATE) 0.05 % ointment Apply topically 2 (two) times daily. Patient taking differently: Apply 1 application topically 2 (two) times daily as needed (rash).  03/03/18  Yes Plotnikov, Georgina Quint, MD  HYDROcodone-acetaminophen (NORCO) 10-325 MG tablet TAKE 1 TABLET BY MOUTH EVERY 6 HOURS AS NEEDED FOR PAIN Patient taking differently: Take 1 tablet by mouth every 6 (six) hours as needed for  moderate pain. TAKE 1 TABLET BY MOUTH EVERY 6 HOURS AS NEEDED FOR PAIN 11/28/18  Yes Plotnikov, Georgina Quint, MD  loperamide (IMODIUM) 2 MG capsule Take 2 mg by mouth as needed for diarrhea or loose stools.   Yes [provider]  Multiple Vitamin (MULTIVITAMIN) capsule Take 1 capsule by mouth daily.    Yes [provider]  ondansetron (ZOFRAN) 4 MG tablet Take 1 tablet (4 mg total) by mouth every 8 (eight) hours as needed for nausea or vomiting. 12/14/18  Yes Plotnikov, Georgina Quint, MD  saccharomyces boulardii (FLORASTOR) 250 MG capsule Take 1 capsule (250 mg total) by mouth 2 (two) times daily. 11/24/17  Yes Plotnikov, Georgina Quint, MD  tadalafil (CIALIS) 5 MG tablet TAKE 1 TABLET BY MOUTH ONCE DAILY Patient taking differently: Take 5 mg by mouth daily.  05/04/18  Yes Plotnikov, Georgina Quint, MD  testosterone cypionate (DEPOTESTOSTERONE CYPIONATE) 200 MG/ML injection INJECT 1 ML (200 MG) INTO THE MUSCLE EVERY 14 DAYS Patient taking differently: Inject 200 mg into the muscle every 14 (fourteen) days.  01/04/19  Yes Plotnikov, Georgina Quint, MD  triamcinolone (KENALOG) 0.1 % paste Use as directed 1 application in the mouth or throat 3 (three) times daily. Patient taking differently: Use as directed 1 application in the mouth or throat 3 (three) times daily as needed (sensitive mouth).  03/03/18 03/03/19 Yes Plotnikov, Georgina Quint, MD  triamcinolone ointment (KENALOG) 0.1 % Apply 1 application topically 2 (two) times daily. Patient taking differently: Apply 1 application topically 2 (two) times daily as needed (rash).  08/01/18  Yes Plotnikov, Georgina Quint, MD  Vitamin D, Ergocalciferol, (DRISDOL) 50000 units CAPS capsule Take 1 capsule (50,000 Units total) by mouth once a week. 10/28/17  Yes Plotnikov, Georgina Quint, MD  Eluxadoline (VIBERZI) 100 MG TABS Take 1 tablet (100 mg total) by mouth 2 (two) times daily. Patient not taking: Reported on 01/22/2019 08/24/17   Plotnikov, Georgina Quint, MD  ibuprofen (ADVIL,MOTRIN) 400 MG  tablet TAKE 1 TABLET BY MOUTH TWICE A DAY AS NEEDED Patient not taking: No sig reported 11/23/17   Plotnikov, Georgina Quint, MD  ipratropium (ATROVENT) 0.03 % nasal spray Place 2 sprays into both nostrils 2 (two) times daily. Patient not taking: Reported on 01/22/2019 02/02/17   Plotnikov, Georgina Quint, MD    Family History Family History  Problem Relation Age of Onset   Hyperlipidemia Other    Coronary artery disease Other    Stroke Mother    Colon cancer Paternal Grandmother    Diabetes Other    Breast cancer Other    Pancreatic cancer Other    Cancer Other        breast, pancreatic   Heart disease Other    Cancer Maternal Grandmother        colon    Social History Social History   Tobacco Use   Smoking status: Never Smoker   Smokeless tobacco: Never Used  Substance Use Topics   Alcohol use: Yes    Comment: 2-6 beers every 2/wks   Drug use: No     Allergies   Contrast media  [iodinated diagnostic agents]; Buspirone hcl; Divalproex sodium; Olanzapine-fluoxetine hcl; Piroxicam; and Wheat bran   Review of Systems Review of Systems  Respiratory: Positive for cough and chest tightness.   Gastrointestinal: Positive for diarrhea (chronic).  Musculoskeletal: Positive for myalgias.  Neurological: Positive for headaches.  All other systems reviewed and are negative.    Physical Exam Updated Vital Signs BP 108/70 (BP Location: Right Arm)    Pulse 91    Temp 98 F (36.7 C) (Oral)    Resp 18    SpO2 96%   Physical Exam Vitals signs and nursing note reviewed.  Constitutional:      General: He is not in acute distress.    Appearance: He is well-developed.     Comments: NAD.  HENT:     Head: Normocephalic and atraumatic.     Right Ear: External ear normal.     Left Ear: External ear normal.     Nose: Nose normal.     Mouth/Throat:     Pharynx: Posterior oropharyngeal erythema present.     Comments: Mild erythema to soft palate and oropharynx. No petechiae. No  tonsillar hypertrophy, exudates, asymmetry. No SL fullness. MMM. Normal phonation.   Eyes:     General: No scleral icterus.  Conjunctiva/sclera: Conjunctivae normal.  Neck:     Musculoskeletal: Normal range of motion and neck supple.     Comments: Mildly tender submandibular LAD, symmetric. Cardiovascular:     Rate and Rhythm: Normal rate and regular rhythm.     Heart sounds: Normal heart sounds.  Pulmonary:     Effort: Pulmonary effort is normal.     Breath sounds: Normal breath sounds.     Comments: Frequent dry cough during exam. Normal work of breathing. Ambulated around room for 2 min without tachycardia, tachypnea or hypoxia.  Musculoskeletal: Normal range of motion.        General: No deformity.  Lymphadenopathy:     Cervical: Cervical adenopathy present.  Skin:    General: Skin is warm and dry.     Capillary Refill: Capillary refill takes less than 2 seconds.  Neurological:     Mental Status: He is alert and oriented to person, place, and time.  Psychiatric:        Behavior: Behavior normal.        Thought Content: Thought content normal.        Judgment: Judgment normal.      ED Treatments / Results  Labs (all labs ordered are listed, but only abnormal results are displayed) Labs Reviewed - No data to display  EKG None  Radiology Dg Chest Portable 1 View  Result Date: 01/22/2019 CLINICAL DATA:  Two week history of cough. Acute onset of fever that began today. EXAM: PORTABLE CHEST 1 VIEW COMPARISON:  06/06/2015, 12/19/2016. FINDINGS: Cardiac silhouette and mediastinal contours normal in appearance for the AP portable technique. Pulmonary parenchyma clear. Bronchovascular markings normal. Pulmonary vascularity normal. No pneumothorax. No visible pleural effusions. No interval change. IMPRESSION: No acute cardiopulmonary disease. Stable examination. Electronically Signed   By: Hulan Saashomas  Lawrence M.D.   On: 01/22/2019 14:48    Procedures Procedures (including  critical care time)  Medications Ordered in ED Medications  aerochamber Z-Stat Plus/medium 1 each (1 each Other Given 01/22/19 1414)  albuterol (VENTOLIN HFA) 108 (90 Base) MCG/ACT inhaler 2 puff (2 puffs Inhalation Given 01/22/19 1413)     Initial Impression / Assessment and Plan / ED Course  I have reviewed the triage vital signs and the nursing notes.  Pertinent labs & imaging results that were available during my care of the patient were reviewed by me and considered in my medical decision making (see chart for details).    Mark Redoaul A Taffe was evaluated in Emergency Department on 01/22/2019 for the symptoms described in the history of present illness. He was evaluated in the context of the global COVID-19 pandemic, which necessitated consideration that the patient might be at risk for infection with the SARS-CoV-2 virus that causes COVID-19. Institutional protocols and algorithms that pertain to the evaluation of patients at risk for COVID-19 are in a state of rapid change based on information released by regulatory bodies including the CDC and federal and state organizations. These policies and algorithms were followed during the patient's care in the ED.    53 y.o. -year-old male with presents with symptoms suggestive of URI.  No frank exertional, pleuritic or positional CP.  No travel. No known exposures to confirmed COVID-19.  Exam is benign.  Normal WOB. No fever, tachypnea, hypoxemia.  Initial tachycardia resolved. Lungs are CTAB. I personally ambulated patient in room and he had no tachycardia or hypoxia.  CXR negative. Doubt bacterial bronchitis or pneumonia.  No signs or symptoms to suggest strep pharyngitis.  Given reassuring physical exam, will discharge with symptomatic treatment.  No wheezing on exam but he may be having bronchospasm causing frequent coughing.  Albuterol and delsym recommended. Pt was evaluated in the context of the global COVID-19 pandemic.  I discussed given  limited resources and low risk profile, we will defer formal COVID testing today as this would not change our outpatient treatment.  He was comfortable with this.  He has no comorbidities that put him at risk for complication.  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  Pt understands signs and symptoms that would warrant return to ED.  Recommended PCP telemedicine visit in 72 hours to evaluate for clinical improvement.  Instructed self-isolation for a total of 7 days since symptoms onset, 3 days without fevers and improvement in symptoms before returning to essential activities/work. Pt was given home self-isolation instructions and instructions for family members.  Final Clinical Impressions(s) / ED Diagnoses   1545: I had printed AVS and RN notified me patient had eloped.  I discussed plan of care including supportive care, f/u with PCP in 72 hours and delsym prior to patient leaving. Will send PCP message to ensure appropriate follow up.  Final diagnoses:  Viral upper respiratory tract infection with cough  Suspected Covid-19 Virus Infection    ED Discharge Orders    None       Liberty Handy, PA-C 01/22/19 1544    Geoffery Lyons, MD 01/25/19 [phone]

## 2019-01-22 NOTE — Telephone Encounter (Signed)
Pt at ED.

## 2019-01-22 NOTE — Discharge Instructions (Signed)
You were seen in the ED for cough and fever.  Your chest x-ray was normal. I suspect you have a virus. Given current outbreak, we will manage symptoms under presumed COVID-19 (coronavirus) infection.  It is also possible you could have other viral upper respiratory infection.  We discussed testing today, and given current resources and low risk profile testing was deferred.  Testing would not have changed our management.  Management will include self-isolation, monitoring of symptoms and supportive care with over-the-counter medicines.  Return to the ED if there is increased work of breathing, shortness of breath, inability to tolerate fluids, weakness, chest pain.  Follow up with your primary care doctor in 72 hours to ensure there is clinical improvement.   The Department of Public Health is recommending the following isolation recommendations.  Track your fever. All 3 of the following must be met:  At least 7 days since symptom onset 72 hours of absence of fever without antifever medicine (ibuprofen, acetaminophen). A fever is temperature of 100.34F or greater. Improvement of respiratory symptoms   Stay well-hydrated. Rest. You can use over the counter medications to help with symptoms: [phone] mg acetaminophen (tylenol) every 6 hours, around the clock to help with associated fevers, sore throat, headaches, generalized body aches and malaise.  Oxymetazoline (afrin) intranasal spray once daily for no more than 3 days to help with congestion, after 3 days you can switch to another over-the-counter nasal steroid spray such as fluticasone (flonase) Allergy medication (loratadine, cetirizine, etc) and phenylephrine (sudafed) help with nasal congestion, runny nose and postnasal drip.   Dextromethorphan (Delsym) to suppress dry cough. Frequent coughing is likely causing your chest wall pain Wash your hands often to prevent spread.  Use albuterol inhaler every 4-6 hours for bronchospasm and frequent  cough   Infection Prevention Recommendations for Individuals Confirmed to have, or Being Evaluated for, or have symptoms of 2019 Novel Coronavirus (COVID-19) Infection Who Receive Care at Home  Individuals who are confirmed to have, or are being evaluated for, COVID-19 should follow the prevention steps below until a healthcare provider or local or state health department says they can return to normal activities.  Stay home except to get medical care You should restrict activities outside your home, except for getting medical care. Do not go to work, school, or public areas, and do not use public transportation or taxis.  Call ahead before visiting your doctor Before your medical appointment, call the healthcare provider and tell them that you have, or are being evaluated for, COVID-19 infection. This will help the healthcare providers office take steps to keep other people from getting infected. Ask your healthcare provider to call the local or state health department.  Monitor your symptoms Seek prompt medical attention if your illness is worsening (e.g., difficulty breathing). Before going to your medical appointment, call the healthcare provider and tell them that you have, or are being evaluated for, COVID-19 infection. Ask your healthcare provider to call the local or state health department.  Wear a facemask You should wear a facemask that covers your nose and mouth when you are in the same room with other people and when you visit a healthcare provider. People who live with or visit you should also wear a facemask while they are in the same room with you.  Separate yourself from other people in your home As much as possible, you should stay in a different room from other people in your home. Also, you should use a separate bathroom,  if available.  Avoid sharing household items You should not share dishes, drinking glasses, cups, eating utensils, towels, bedding, or other items  with other people in your home. After using these items, you should wash them thoroughly with soap and water.  Cover your coughs and sneezes Cover your mouth and nose with a tissue when you cough or sneeze, or you can cough or sneeze into your sleeve. Throw used tissues in a lined trash can, and immediately wash your hands with soap and water for at least 20 seconds or use an alcohol-based hand rub.  Wash your [facility] your hands often and thoroughly with soap and water for at least 20 seconds. You can use an alcohol-based hand sanitizer if soap and water are not available and if your hands are not visibly dirty. Avoid touching your eyes, nose, and mouth with unwashed hands.   Prevention Steps for Caregivers and Household Members of Individuals Confirmed to have, or Being Evaluated for, or have symptoms of 2019 Novel Coronavirus (COVID-19) Infection Being Cared for in the Home  If you live with, or provide care at home for, a person confirmed to have, or being evaluated for, COVID-19 infection please follow these guidelines to prevent infection:  Follow healthcare providers instructions Make sure that you understand and can help the patient follow any healthcare provider instructions for all care.  Provide for the patients basic needs You should help the patient with basic needs in the home and provide support for getting groceries, prescriptions, and other personal needs.  Monitor the patients symptoms If they are getting sicker, call his or her medical provider and tell them that the patient has, or is being evaluated for, COVID-19 infection. This will help the healthcare providers office take steps to keep other people from getting infected. Ask the healthcare provider to call the local or state health department.  Limit the number of people who have contact with the patient If possible, have only one caregiver for the patient. Other household members should stay in another home  or place of residence. If this is not possible, they should stay in another room, or be separated from the patient as much as possible. Use a separate bathroom, if available. Restrict visitors who do not have an essential need to be in the home.  Keep older adults, very young children, and other sick people away from the patient Keep older adults, very young children, and those who have compromised immune systems or chronic health conditions away from the patient. This includes people with chronic heart, lung, or kidney conditions, diabetes, and cancer.  Ensure good ventilation Make sure that shared spaces in the home have good air flow, such as from an air conditioner or an opened window, weather permitting.  Wash your hands often Wash your hands often and thoroughly with soap and water for at least 20 seconds. You can use an alcohol based hand sanitizer if soap and water are not available and if your hands are not visibly dirty. Avoid touching your eyes, nose, and mouth with unwashed hands. Use disposable paper towels to dry your hands. If not available, use dedicated cloth towels and replace them when they become wet.  Wear a facemask and gloves Wear a disposable facemask at all times in the room and gloves when you touch or have contact with the patients blood, body fluids, and/or secretions or excretions, such as sweat, saliva, sputum, nasal mucus, vomit, urine, or feces.  Ensure the mask fits over your  nose and mouth tightly, and do not touch it during use. Throw out disposable facemasks and gloves after using them. Do not reuse. Wash your hands immediately after removing your facemask and gloves. If your personal clothing becomes contaminated, carefully remove clothing and launder. Wash your hands after handling contaminated clothing. Place all used disposable facemasks, gloves, and other waste in a lined container before disposing them with other household waste. Remove gloves and wash  your hands immediately after handling these items.  Do not share dishes, glasses, or other household items with the patient Avoid sharing household items. You should not share dishes, drinking glasses, cups, eating utensils, towels, bedding, or other items with a patient who is confirmed to have, or being evaluated for, COVID-19 infection. After the person uses these items, you should wash them thoroughly with soap and water.  Wash laundry thoroughly Immediately remove and wash clothes or bedding that have blood, body fluids, and/or secretions or excretions, such as sweat, saliva, sputum, nasal mucus, vomit, urine, or feces, on them. Wear gloves when handling laundry from the patient. Read and follow directions on labels of laundry or clothing items and detergent. In general, wash and dry with the warmest temperatures recommended on the label.  Clean all areas the individual has used often Clean all touchable surfaces, such as counters, tabletops, doorknobs, bathroom fixtures, toilets, phones, keyboards, tablets, and bedside tables, every day. Also, clean any surfaces that may have blood, body fluids, and/or secretions or excretions on them. Wear gloves when cleaning surfaces the patient has come in contact with. Use a diluted bleach solution (e.g., dilute bleach with 1 part bleach and 10 parts water) or a household disinfectant with a label that says EPA-registered for coronaviruses. To make a bleach solution at home, add 1 tablespoon of bleach to 1 quart (4 cups) of water. For a larger supply, add  cup of bleach to 1 gallon (16 cups) of water. Read labels of cleaning products and follow recommendations provided on product labels. Labels contain instructions for safe and effective use of the cleaning product including precautions you should take when applying the product, such as wearing gloves or eye protection and making sure you have good ventilation during use of the product. Remove gloves and  wash hands immediately after cleaning.  Monitor yourself for signs and symptoms of illness Caregivers and household members are considered close contacts, should monitor their health, and will be asked to limit movement outside of the home to the extent possible. Follow the monitoring steps for close contacts listed on the symptom monitoring form.  ? If you have additional questions, contact your local health department or call the epidemiologist on call at [phone] (available 24/7). ? This guidance is subject to change. For the most up-to-date guidance from [facility], please refer to their website: [email or web address]

## 2019-01-23 MED FILL — AMPHETAMINE-DEXTROAMPHETAMI: 10 | 30 days supply | Qty: 60 | Fill #0

## 2019-02-01 MED FILL — TRUVADA 200-300 MG TABS: 200-300 | 30 days supply | Qty: 30 | Fill #1

## 2019-02-15 ENCOUNTER — Ambulatory Visit (INDEPENDENT_AMBULATORY_CARE_PROVIDER_SITE_OTHER): Payer: 59 | Admitting: Internal Medicine

## 2019-02-15 ENCOUNTER — Ambulatory Visit: Payer: Self-pay | Admitting: Internal Medicine

## 2019-02-15 ENCOUNTER — Encounter: Payer: Self-pay | Admitting: Internal Medicine

## 2019-02-15 DIAGNOSIS — J4521 Mild intermittent asthma with (acute) exacerbation: Secondary | ICD-10-CM | POA: Diagnosis not present

## 2019-02-15 DIAGNOSIS — F418 Other specified anxiety disorders: Secondary | ICD-10-CM

## 2019-02-15 DIAGNOSIS — M5440 Lumbago with sciatica, unspecified side: Secondary | ICD-10-CM

## 2019-02-15 DIAGNOSIS — R5382 Chronic fatigue, unspecified: Secondary | ICD-10-CM

## 2019-02-15 DIAGNOSIS — R509 Fever, unspecified: Secondary | ICD-10-CM | POA: Diagnosis not present

## 2019-02-15 DIAGNOSIS — G8929 Other chronic pain: Secondary | ICD-10-CM | POA: Diagnosis not present

## 2019-02-15 MED ORDER — BENZONATATE 200 MG PO CAPS: 200.0000 mg | ORAL_CAPSULE | Freq: Three times a day (TID) | ORAL | 0 refills | Status: DC | PRN

## 2019-02-15 MED ORDER — LEVOFLOXACIN 500 MG PO TABS: 500.0000 mg | ORAL_TABLET | Freq: Every day | ORAL | 0 refills | Status: AC

## 2019-02-15 NOTE — Assessment & Plan Note (Signed)
Worse due to his current upper respiratory tract illness

## 2019-02-15 NOTE — Telephone Encounter (Signed)
Appointment scheduled.

## 2019-02-15 NOTE — Assessment & Plan Note (Signed)
Prolonged fever and respiratory symptoms.  Will start on empiric Levaquin.  Prescribed Tessalon for cough.  If not better by Monday, Mark Mcgee will need another chest x-ray and lab work.  Go to ER if worse

## 2019-02-15 NOTE — Assessment & Plan Note (Signed)
Continue with PRN Xanax

## 2019-02-15 NOTE — Assessment & Plan Note (Signed)
Continue to use prescribed inhaler.  Tessalon PRN

## 2019-02-15 NOTE — Progress Notes (Signed)
Virtual Visit via Video Note  I connected with Luther Redo on 02/15/19 at 10:00 AM EDT by a video enabled telemedicine application and verified that I am speaking with the correct person using two identifiers.   I discussed the limitations of evaluation and management by telemedicine and the availability of in person appointments. The patient expressed understanding and agreed to proceed.  History of Present Illness: Deborah is complaining of daily fever of 100-101 Fahrenheit with cough, body aches, weakness, runny nose, congestion.  He has been sick since the end of April.  He went to ER and was diagnosed with a upper respiratory tract infection.  He was not tested for COVID.  He had a chest x-ray which was normal.  He was given an inhaler.  We need to follow-up on chronic pain, IBS.  There has been problems with runny nose, cough, chest pain, shortness of breath, abdominal pain, diarrhea, constipation, arthralgias No skin rashes.   Observations/Objective: The patient appears to be in no acute distress, he looks ill.  He is hoarse.  He sounds congested  Assessment and Plan:  See my Assessment and Plan. Follow Up Instructions:    I discussed the assessment and treatment plan with the patient. The patient was provided an opportunity to ask questions and all were answered. The patient agreed with the plan and demonstrated an understanding of the instructions.   The patient was advised to call back or seek an in-person evaluation if the symptoms worsen or if the condition fails to improve as anticipated.  I provided face-to-face time during this encounter. We were at different locations.   Sonda Primes, MD

## 2019-02-15 NOTE — Telephone Encounter (Signed)
Pt called in c/o being sick with URI symptoms for "a while now".   Was seen in ED in April for same.  He is now feeling worse this morning.  I warm transferred his call to [facility] in Dr. Loren Racer office to be scheduled for a virtual visit due to the COVID-19 pandemic.  I sent these notes to the office.    Reason for Disposition . Fever present > 3 days (72 hours)  Answer Assessment - Initial Assessment Questions 1. TEMPERATURE: "What is the most recent temperature?"  "How was it meured?"      100.4 orally 2. ONSET: "When did the fever start?"      I've had this fever for weeks.   I had same symptoms in April.   I went to ED.  [facility].    Yesterday I started feeling worse.   I took Tylenol and a Zyrtec.   I'm working from home.   I live in [place]. 3. SYMPTOMS: "Do you have any other symptoms besides the fever?"  (e.g., colds, headache, sore throat, earache, cough, rash, diarrhea, vomiting, abdominal pain)     Chills this morning, joint pain, I feel terrible.  I live alone thank goodness.  No exposure to COVID-19.   I'm having diarrhea but that's normal for me. 4. CAUSE: If there are no symptoms, ask: "What do you think is causing the fever?"      I don't know 5. CONTACTS: "Does anyone else in the family have an infection?"     No   I live alone 6. TREATMENT: "What have you done so far to treat this fever?" (e.g., medications)     Tylenol & Zyrtec. 7. IMMUNOCOMPROMISE: "Do you have of the following: diabetes, HIV positive, splenectomy, cancer chemotherapy, chronic steroid treatment, transplant patient, etc."     Hypertension controlled with meds. 8. PREGNANCY: "Is there any chance you are pregnant?" "When was your last menstrual period?"     N/A 9. TRAVEL: "Have you traveled out of the country in the last month?" (e.g., travel history, exposures)     No travels.  Protocols used: FEVER-A-AH

## 2019-02-15 NOTE — Assessment & Plan Note (Signed)
Continue with Norco as needed 

## 2019-02-16 MED FILL — HYDROCODON-APAP 10-325: 10-325 | 30 days supply | Qty: 120 | Fill #0

## 2019-02-16 MED FILL — ALPRAZolam 2 MG TABS: 2 | 30 days supply | Qty: 90 | Fill #1

## 2019-02-28 MED FILL — TRUVADA 200-300 MG TABS: 200-300 | 30 days supply | Qty: 30 | Fill #2

## 2019-03-01 ENCOUNTER — Other Ambulatory Visit: Payer: Self-pay

## 2019-03-01 ENCOUNTER — Encounter: Payer: Self-pay | Admitting: Internal Medicine

## 2019-03-01 ENCOUNTER — Ambulatory Visit (INDEPENDENT_AMBULATORY_CARE_PROVIDER_SITE_OTHER): Payer: 59 | Admitting: Internal Medicine

## 2019-03-01 VITALS — BP 116/74 | HR 72 | Temp 97.6°F | Ht 67.0 in | Wt 222.0 lb

## 2019-03-01 DIAGNOSIS — F418 Other specified anxiety disorders: Secondary | ICD-10-CM

## 2019-03-01 DIAGNOSIS — F4329 Adjustment disorder with other symptoms: Secondary | ICD-10-CM | POA: Diagnosis not present

## 2019-03-01 NOTE — Assessment & Plan Note (Signed)
Mark Mcgee is very upset and miserable over termination of his 8-year relationship with [name].  She has IBS is worse.  He is very depressed.  We had a long conversation about it.  [name] is not suicidal.  We will make arrangements for him to see Dr. Caralyn Guile as soon as possible.

## 2019-03-01 NOTE — Progress Notes (Signed)
Subjective:  Patient ID: Mark Mcgee, male    DOB: [birth year]  Age: 53 y.o. MRN: [id number]  CC: No chief complaint on file.   HPI [name] presents for severe diarrhea, leakage F/u HTN, LBP, depression, anxiety [name] broke up with [name] after 8 year relationship without explanation.  [name] is very hurt  Outpatient Medications Prior to Visit  Medication Sig Dispense Refill  . acetaminophen (TYLENOL) 325 MG tablet Take 975 mg by mouth every 6 (six) hours as needed for mild pain or headache.    [name] acyclovir (ZOVIRAX) 400 MG tablet TAKE 1 TABLET BY MOUTH 4 TIMES DAILY (Patient taking differently: Take 400 mg by mouth 4 (four) times daily as needed (break out). TAKE 1 TABLET BY MOUTH 4 TIMES DAILY) 120 tablet 1  . alprazolam (XANAX) 2 MG tablet TAKE 1 TABLET BY MOUTH 3 TIMES DAILY (Patient taking differently: Take 2 mg by mouth 3 (three) times daily as needed for sleep or anxiety. ) 90 tablet 1  . amLODipine-olmesartan (AZOR) 10-40 MG tablet Take 1 tablet by mouth daily. 90 tablet 3  . amphetamine-dextroamphetamine (ADDERALL) 10 MG tablet TAKE 1 TABLET (10 MG TOTAL) BY MOUTH 2 (TWO) TIMES DAILY WITH BREAKFAST AND LUNCH. 60 tablet 0  . b complex vitamins tablet Take 1 tablet by mouth daily.      . benzonatate (TESSALON) 200 MG capsule Take 1 capsule (200 mg total) by mouth 3 (three) times daily as needed for cough. 60 capsule 0  . Cholecalciferol (VITAMIN D) 50 MCG (2000 UT) tablet Take 2,000 Units by mouth daily.    . cholestyramine (QUESTRAN) 4 g packet Take 1 packet (4 g total) by mouth 3 (three) times daily with meals. 90 each 3  . dicyclomine (BENTYL) 20 MG tablet Take 1 tablet (20 mg total) by mouth 3 (three) times daily before meals. 90 tablet 1  . diphenoxylate-atropine (LOMOTIL) 2.5-0.025 MG tablet TAKE 1 OR 2 TABLETS BY MOUTH 4 TIMES DAILY AS NEEDED FOR DIARRHEA OR LOOSE STOOLS MAX OF 8 TABLETS PER DAY (Patient taking differently: Take 1-2 tablets by mouth 4 (four) times daily as  needed for diarrhea or loose stools. TAKE 1 OR 2 TABLETS BY MOUTH 4 TIMES DAILY AS NEEDED FOR DIARRHEA OR LOOSE STOOLS MAX OF 8 TABLETS PER DAY) 60 tablet 3  . Eluxadoline (VIBERZI) 100 MG TABS Take 1 tablet (100 mg total) by mouth 2 (two) times daily. 180 tablet 1  . emtricitabine-tenofovir (TRUVADA) 200-300 MG tablet Take 1 tablet by mouth daily. 90 tablet 1  . Flaxseed, Linseed, (FLAX SEEDS) POWD Take by mouth 2 (two) times daily.      [name] glycopyrrolate (ROBINUL) 2 MG tablet Take 1 tablet (2 mg total) by mouth 2 (two) times daily. (Patient taking differently: Take 2 mg by mouth 2 (two) times daily as needed (spasms). ) 180 tablet 0  . halobetasol (ULTRAVATE) 0.05 % ointment Apply topically 2 (two) times daily. (Patient taking differently: Apply 1 application topically 2 (two) times daily as needed (rash). ) 50 g 3  . HYDROcodone-acetaminophen (NORCO) 10-325 MG tablet TAKE 1 TABLET BY MOUTH EVERY 6 HOURS AS NEEDED FOR PAIN 120 tablet 0  . ibuprofen (ADVIL,MOTRIN) 400 MG tablet TAKE 1 TABLET BY MOUTH TWICE A DAY AS NEEDED 60 tablet 2  . ipratropium (ATROVENT) 0.03 % nasal spray Place 2 sprays into both nostrils 2 (two) times daily. 30 mL 5  . loperamide (IMODIUM) 2 MG capsule Take 2 mg by mouth  as needed for diarrhea or loose stools.    . Multiple Vitamin (MULTIVITAMIN) capsule Take 1 capsule by mouth daily.     . ondansetron (ZOFRAN) 4 MG tablet Take 1 tablet (4 mg total) by mouth every 8 (eight) hours as needed for nausea or vomiting. 30 tablet 0  . saccharomyces boulardii (FLORASTOR) 250 MG capsule Take 1 capsule (250 mg total) by mouth 2 (two) times daily. 60 capsule 3  . tadalafil (CIALIS) 5 MG tablet TAKE 1 TABLET BY MOUTH ONCE DAILY (Patient taking differently: Take 5 mg by mouth daily. ) 90 tablet 3  . testosterone cypionate (DEPOTESTOSTERONE CYPIONATE) 200 MG/ML injection INJECT 1 ML (200 MG) INTO THE MUSCLE EVERY 14 DAYS (Patient taking differently: Inject 200 mg into the muscle every 14  (fourteen) days. ) 10 mL 5  . triamcinolone (KENALOG) 0.1 % paste Use as directed 1 application in the mouth or throat 3 (three) times daily. (Patient taking differently: Use as directed 1 application in the mouth or throat 3 (three) times daily as needed (sensitive mouth). ) 5 g 3  . triamcinolone ointment (KENALOG) 0.1 % Apply 1 application topically 2 (two) times daily. (Patient taking differently: Apply 1 application topically 2 (two) times daily as needed (rash). ) 80 g 2  . Vitamin D, Ergocalciferol, (DRISDOL) 50000 units CAPS capsule Take 1 capsule (50,000 Units total) by mouth once a week. 8 capsule 0   No facility-administered medications prior to visit.     ROS: Review of Systems  Constitutional: Positive for fatigue. Negative for appetite change and unexpected weight change.  HENT: Negative for congestion, nosebleeds, sneezing, sore throat and trouble swallowing.   Eyes: Negative for itching and visual disturbance.  Respiratory: Negative for cough.   Cardiovascular: Negative for chest pain, palpitations and leg swelling.  Gastrointestinal: Positive for diarrhea and nausea. Negative for abdominal distention and blood in stool.  Genitourinary: Negative for frequency and hematuria.  Musculoskeletal: Positive for arthralgias and back pain. Negative for gait problem, joint swelling and neck pain.  Skin: Negative for rash.  Neurological: Negative for dizziness, tremors, speech difficulty and weakness.  Psychiatric/Behavioral: Positive for behavioral problems, decreased concentration and dysphoric mood. Negative for agitation, sleep disturbance and suicidal ideas. The patient is nervous/anxious.     Objective:  BP 116/74 (BP Location: Left Arm, Patient Position: Sitting, Cuff Size: Large)   Pulse 72   Temp 97.6 F (36.4 C) (Oral)   Ht 5\' 7"  (1.702 m)   Wt 222 lb (100.7 kg)   SpO2 99%   BMI 34.77 kg/m   BP Readings from Last 3 Encounters:  03/01/19 116/74  01/22/19 108/70   11/28/18 98/64    Wt Readings from Last 3 Encounters:  03/01/19 222 lb (100.7 kg)  11/28/18 217 lb (98.4 kg)  10/23/18 206 lb (93.4 kg)    Physical Exam Constitutional:      General: He is not in acute distress.    Appearance: He is well-developed.     Comments: NAD  Eyes:     Conjunctiva/sclera: Conjunctivae normal.     Pupils: Pupils are equal, round, and reactive to light.  Neck:     Musculoskeletal: Normal range of motion.     Thyroid: No thyromegaly.     Vascular: No JVD.  Cardiovascular:     Rate and Rhythm: Normal rate and regular rhythm.     Heart sounds: Normal heart sounds. No murmur. No friction rub. No gallop.   Pulmonary:     Effort:  Pulmonary effort is normal. No respiratory distress.     Breath sounds: Normal breath sounds. No wheezing or rales.  Chest:     Chest wall: No tenderness.  Abdominal:     General: Bowel sounds are normal. There is no distension.     Palpations: Abdomen is soft. There is no mass.     Tenderness: There is no abdominal tenderness. There is no guarding or rebound.  Musculoskeletal: Normal range of motion.        General: No tenderness.  Lymphadenopathy:     Cervical: No cervical adenopathy.  Skin:    General: Skin is warm and dry.     Findings: No rash.  Neurological:     Mental Status: He is alert and oriented to person, place, and time.     Cranial Nerves: No cranial nerve deficit.     Motor: No abnormal muscle tone.     Coordination: Coordination normal.     Gait: Gait normal.     Deep Tendon Reflexes: Reflexes are normal and symmetric.  Psychiatric:        Behavior: Behavior normal.        Thought Content: Thought content normal.        Judgment: Judgment normal.   Renae Fickleaul is tearful.  There is hand tremor present  FTF>25 min  Lab Results  Component Value Date   WBC 8.0 11/28/2018   HGB 13.3 11/28/2018   HCT 41.3 11/28/2018   PLT 249.0 11/28/2018   GLUCOSE 96 11/28/2018   CHOL 192 11/28/2018   TRIG 165.0 (H)  11/28/2018   HDL 45.30 11/28/2018   LDLDIRECT 127.1 08/30/2012   LDLCALC 114 (H) 11/28/2018   ALT 35 11/28/2018   AST 21 11/28/2018   NA 140 11/28/2018   K 5.0 11/28/2018   CL 104 11/28/2018   CREATININE 0.99 11/28/2018   BUN 17 11/28/2018   CO2 28 11/28/2018   TSH 2.16 11/28/2018   PSA 0.47 11/28/2018    Dg Chest Portable 1 View  Result Date: 01/22/2019 CLINICAL DATA:  Two week history of cough. Acute onset of fever that began today. EXAM: PORTABLE CHEST 1 VIEW COMPARISON:  06/06/2015, 12/19/2016. FINDINGS: Cardiac silhouette and mediastinal contours normal in appearance for the AP portable technique. Pulmonary parenchyma clear. Bronchovascular markings normal. Pulmonary vascularity normal. No pneumothorax. No visible pleural effusions. No interval change. IMPRESSION: No acute cardiopulmonary disease. Stable examination. Electronically Signed   By: Hulan Saashomas  Lawrence M.D.   On: 01/22/2019 14:48    Assessment & Plan:   There are no diagnoses linked to this encounter.   No orders of the defined types were placed in this encounter.    Follow-up: No follow-ups on file.  Sonda PrimesAlex Plotnikov, MD

## 2019-03-01 NOTE — Assessment & Plan Note (Signed)
Xanax prn 

## 2019-03-05 ENCOUNTER — Ambulatory Visit: Payer: 59 | Admitting: Psychology

## 2019-03-06 ENCOUNTER — Ambulatory Visit (INDEPENDENT_AMBULATORY_CARE_PROVIDER_SITE_OTHER): Payer: 59 | Admitting: Psychology

## 2019-03-06 DIAGNOSIS — F339 Major depressive disorder, recurrent, unspecified: Secondary | ICD-10-CM | POA: Diagnosis not present

## 2019-03-06 DIAGNOSIS — F419 Anxiety disorder, unspecified: Secondary | ICD-10-CM | POA: Diagnosis not present

## 2019-03-08 MED FILL — AMLODIPINE-OLMESARTAN 10-40: 10-40 | 90 days supply | Qty: 90 | Fill #0

## 2019-03-11 ENCOUNTER — Other Ambulatory Visit: Payer: Self-pay | Admitting: Internal Medicine

## 2019-03-11 MED ORDER — ALPRAZOLAM 2 MG PO TABS: 2.0000 mg | ORAL_TABLET | Freq: Four times a day (QID) | ORAL | 1 refills | Status: DC | PRN

## 2019-03-12 MED FILL — ALPRAZolam 2 MG TABS: 2 | 30 days supply | Qty: 120 | Fill #0

## 2019-03-15 ENCOUNTER — Ambulatory Visit (INDEPENDENT_AMBULATORY_CARE_PROVIDER_SITE_OTHER): Payer: 59 | Admitting: Psychology

## 2019-03-15 DIAGNOSIS — F339 Major depressive disorder, recurrent, unspecified: Secondary | ICD-10-CM

## 2019-03-15 DIAGNOSIS — F419 Anxiety disorder, unspecified: Secondary | ICD-10-CM | POA: Diagnosis not present

## 2019-03-20 ENCOUNTER — Other Ambulatory Visit: Payer: Self-pay | Admitting: Internal Medicine

## 2019-03-20 MED ORDER — PAROXETINE HCL 10 MG PO TABS: 10.0000 mg | ORAL_TABLET | Freq: Every day | ORAL | 5 refills | Status: DC

## 2019-03-26 MED FILL — TRUVADA 200-300 MG TABS: 200-300 | 30 days supply | Qty: 30 | Fill #3

## 2019-03-28 ENCOUNTER — Ambulatory Visit: Payer: 59 | Attending: Internal Medicine | Admitting: Pharmacist

## 2019-03-28 ENCOUNTER — Other Ambulatory Visit: Payer: Self-pay

## 2019-03-28 DIAGNOSIS — Z79899 Other long term (current) drug therapy: Secondary | ICD-10-CM

## 2019-03-28 NOTE — Progress Notes (Signed)
   S: Patient presents for review of their specialty medication therapy.    Patient is currently taking Truvada for HIV PrEP therapy. Patient is managed by Dr. [name] for this. Pt has been on this medication for several years now.  Adherence: confirms; missed 2 doses ~ 2 months ago  Dosing: 1 tablet daily  Monitoring: HIV RNA: undetectable Hepatotoxicity: WNL Nephrotoxicity: WNL S/sx of decreased bone density: denies  S/sx of immune reconstitution syndrome: denies  S/sx of lactic acidosis/hepatomegaly: denies  O:  Lab Results  Component Value Date   WBC 8.0 11/28/2018   HGB 13.3 11/28/2018   HCT 41.3 11/28/2018   MCV 77.7 (L) 11/28/2018   PLT 249.0 11/28/2018      Chemistry      Component Value Date/Time   NA 140 11/28/2018 1345   K 5.0 11/28/2018 1345   CL 104 11/28/2018 1345   CO2 28 11/28/2018 1345   BUN 17 11/28/2018 1345   CREATININE 0.99 11/28/2018 1345      Component Value Date/Time   CALCIUM 9.7 11/28/2018 1345   ALKPHOS 79 11/28/2018 1345   AST 21 11/28/2018 1345   ALT 35 11/28/2018 1345   BILITOT 0.4 11/28/2018 1345     No results found for: CD4TCELL, CD4TABS  Lab Results  Component Value Date   HIV1RNAQUANT <20 NOT DETECTED 11/28/2018   A/P: 1. Medication review: patient is on Truvada for PrEP. Reviewed the medication with the patient, including the following: Truvada is a combination medication used for the treatment of HIV or for the prevention of HIV. Patient educated on purpose, proper use and potential adverse effects of Truvada. Common adverse effects including metabolic disturbances, neuropsychiatric symptoms, and skin reactions. Adherence is crucial when using this drug to avoid mutations and resistance. May be administered with or without food. No recommendations for changes at this time.   Benard Halsted, PharmD, [place] [phone]

## 2019-03-29 ENCOUNTER — Other Ambulatory Visit: Payer: Self-pay | Admitting: Internal Medicine

## 2019-04-02 MED FILL — HYDROCODON-APAP 10-325: 10-325 | 30 days supply | Qty: 120 | Fill #0

## 2019-04-12 ENCOUNTER — Other Ambulatory Visit: Payer: Self-pay

## 2019-04-12 ENCOUNTER — Ambulatory Visit (INDEPENDENT_AMBULATORY_CARE_PROVIDER_SITE_OTHER): Payer: 59 | Admitting: Internal Medicine

## 2019-04-12 DIAGNOSIS — Z20828 Contact with and (suspected) exposure to other viral communicable diseases: Secondary | ICD-10-CM

## 2019-04-12 DIAGNOSIS — J069 Acute upper respiratory infection, unspecified: Secondary | ICD-10-CM | POA: Diagnosis not present

## 2019-04-12 DIAGNOSIS — F4323 Adjustment disorder with mixed anxiety and depressed mood: Secondary | ICD-10-CM | POA: Diagnosis not present

## 2019-04-12 DIAGNOSIS — Z20822 Contact with and (suspected) exposure to covid-19: Secondary | ICD-10-CM

## 2019-04-12 DIAGNOSIS — M352 Behcet's disease: Secondary | ICD-10-CM | POA: Diagnosis not present

## 2019-04-12 DIAGNOSIS — J4521 Mild intermittent asthma with (acute) exacerbation: Secondary | ICD-10-CM | POA: Diagnosis not present

## 2019-04-12 MED ORDER — BENZONATATE 200 MG PO CAPS: 200.0000 mg | ORAL_CAPSULE | Freq: Three times a day (TID) | ORAL | 0 refills | Status: DC | PRN

## 2019-04-12 MED ORDER — ALBUTEROL SULFATE HFA 108 (90 BASE) MCG/ACT IN AERS: 2.0000 | INHALATION_SPRAY | RESPIRATORY_TRACT | 5 refills | Status: DC | PRN

## 2019-04-12 MED ORDER — METHYLPREDNISOLONE 4 MG PO TBPK: ORAL_TABLET | ORAL | 0 refills | Status: DC

## 2019-04-12 MED ORDER — AZITHROMYCIN 250 MG PO TABS: ORAL_TABLET | ORAL | 0 refills | Status: DC

## 2019-04-12 NOTE — Progress Notes (Signed)
Virtual Visit via Video Note  I connected with Mark Mcgee on 04/12/19 at  2:40 PM EDT by a video enabled telemedicine application and verified that I am speaking with the correct person using two identifiers.   I discussed the limitations of evaluation and management by telemedicine and the availability of in person appointments. The patient expressed understanding and agreed to proceed.  History of Present Illness: Complaining of severe cold-like symptoms of several days duration, severe cough, chest tightness unrelieved with over-the-counter medications.  There has been runny nose, cough, chest pain, shortness of breath. No abdominal pain, diarrhea, constipation,  skin rashes.   Observations/Objective: The patient appears to be in no acute distress, looks tired. Coughing  Assessment and Plan:  See my Assessment and Plan. Follow Up Instructions:    I discussed the assessment and treatment plan with the patient. The patient was provided an opportunity to ask questions and all were answered. The patient agreed with the plan and demonstrated an understanding of the instructions.   The patient was advised to call back or seek an in-person evaluation if the symptoms worsen or if the condition fails to improve as anticipated.  I provided face-to-face time during this encounter. We were at different locations.   Walker Kehr, MD

## 2019-04-12 NOTE — Assessment & Plan Note (Addendum)
Paxil A little better

## 2019-04-12 NOTE — Assessment & Plan Note (Signed)
Ventolin HFA, Medrol pack, Zpac, Tessalon COVID19 test

## 2019-04-12 NOTE — Assessment & Plan Note (Signed)
Zpac COVID19 test

## 2019-04-13 LAB — NOVEL CORONAVIRUS, NAA: SARS-CoV-2, NAA: NOT DETECTED

## 2019-04-16 ENCOUNTER — Encounter: Payer: Self-pay | Admitting: Hematology

## 2019-04-16 ENCOUNTER — Encounter: Payer: Self-pay | Admitting: Internal Medicine

## 2019-04-18 MED FILL — ALPRAZolam 2 MG TABS: 2 | 30 days supply | Qty: 120 | Fill #1

## 2019-04-23 MED FILL — TRUVADA 200-300 MG TABS: 200-300 | 30 days supply | Qty: 30 | Fill #4

## 2019-04-26 ENCOUNTER — Other Ambulatory Visit: Payer: Self-pay | Admitting: Internal Medicine

## 2019-04-26 MED ORDER — PAROXETINE HCL 10 MG PO TABS: 10.0000 mg | ORAL_TABLET | Freq: Every day | ORAL | 1 refills | Status: DC

## 2019-04-27 MED FILL — PARoxetine HCL 10 MG TABS: 10 | 90 days supply | Qty: 90 | Fill #0

## 2019-05-02 ENCOUNTER — Other Ambulatory Visit: Payer: Self-pay | Admitting: Internal Medicine

## 2019-05-05 MED FILL — HYDROCODON-APAP 10-325: 10-325 | 30 days supply | Qty: 120 | Fill #0

## 2019-05-17 MED FILL — TRUVADA 200-300 MG TABS: 200-300 | 30 days supply | Qty: 30 | Fill #5

## 2019-05-28 NOTE — Telephone Encounter (Signed)
Forms have been updated and placed in providers box to review and sign.

## 2019-05-30 NOTE — Telephone Encounter (Signed)
Forms have been signed, Faxed, Copy sent to scan.   Patient informed by Deloris Ping and original mailed to patient.

## 2019-06-06 ENCOUNTER — Other Ambulatory Visit: Payer: Self-pay

## 2019-06-06 ENCOUNTER — Ambulatory Visit (INDEPENDENT_AMBULATORY_CARE_PROVIDER_SITE_OTHER): Payer: 59 | Admitting: Internal Medicine

## 2019-06-06 ENCOUNTER — Other Ambulatory Visit: Payer: Self-pay | Admitting: Internal Medicine

## 2019-06-06 ENCOUNTER — Other Ambulatory Visit (INDEPENDENT_AMBULATORY_CARE_PROVIDER_SITE_OTHER): Payer: 59

## 2019-06-06 ENCOUNTER — Encounter: Payer: Self-pay | Admitting: Internal Medicine

## 2019-06-06 VITALS — BP 122/74 | HR 88 | Temp 97.8°F | Ht 67.0 in | Wt 218.0 lb

## 2019-06-06 DIAGNOSIS — Z5181 Encounter for therapeutic drug level monitoring: Secondary | ICD-10-CM

## 2019-06-06 DIAGNOSIS — I10 Essential (primary) hypertension: Secondary | ICD-10-CM | POA: Diagnosis not present

## 2019-06-06 DIAGNOSIS — F4329 Adjustment disorder with other symptoms: Secondary | ICD-10-CM | POA: Diagnosis not present

## 2019-06-06 DIAGNOSIS — R197 Diarrhea, unspecified: Secondary | ICD-10-CM | POA: Diagnosis not present

## 2019-06-06 DIAGNOSIS — E291 Testicular hypofunction: Secondary | ICD-10-CM | POA: Diagnosis not present

## 2019-06-06 DIAGNOSIS — F988 Other specified behavioral and emotional disorders with onset usually occurring in childhood and adolescence: Secondary | ICD-10-CM | POA: Diagnosis not present

## 2019-06-06 LAB — CBC WITH DIFFERENTIAL/PLATELET
Basophils Absolute: 0.1 10*3/uL (ref 0.0–0.1)
Basophils Relative: 0.9 % (ref 0.0–3.0)
Eosinophils Absolute: 0.2 10*3/uL (ref 0.0–0.7)
Eosinophils Relative: 3.1 % (ref 0.0–5.0)
HCT: 40.8 % (ref 39.0–52.0)
Hemoglobin: 13.2 g/dL (ref 13.0–17.0)
Lymphocytes Relative: 17.4 % (ref 12.0–46.0)
Lymphs Abs: 1.1 10*3/uL (ref 0.7–4.0)
MCHC: 32.3 g/dL (ref 30.0–36.0)
MCV: 83 fl (ref 78.0–100.0)
Monocytes Absolute: 0.5 10*3/uL (ref 0.1–1.0)
Monocytes Relative: 7.3 % (ref 3.0–12.0)
Neutro Abs: 4.6 10*3/uL (ref 1.4–7.7)
Neutrophils Relative %: 71.3 % (ref 43.0–77.0)
Platelets: 247 10*3/uL (ref 150.0–400.0)
RBC: 4.92 Mil/uL (ref 4.22–5.81)
RDW: 13.7 % (ref 11.5–15.5)
WBC: 6.4 10*3/uL (ref 4.0–10.5)

## 2019-06-06 LAB — HEPATIC FUNCTION PANEL
ALT: 24 U/L (ref 0–53)
AST: 15 U/L (ref 0–37)
Albumin: 4.8 g/dL (ref 3.5–5.2)
Alkaline Phosphatase: 49 U/L (ref 39–117)
Bilirubin, Direct: 0.1 mg/dL (ref 0.0–0.3)
Total Bilirubin: 0.7 mg/dL (ref 0.2–1.2)
Total Protein: 7.1 g/dL (ref 6.0–8.3)

## 2019-06-06 LAB — BASIC METABOLIC PANEL
BUN: 11 mg/dL (ref 6–23)
CO2: 26 mEq/L (ref 19–32)
Calcium: 9.1 mg/dL (ref 8.4–10.5)
Chloride: 103 mEq/L (ref 96–112)
Creatinine, Ser: 1.07 mg/dL (ref 0.40–1.50)
GFR: 72.53 mL/min (ref >60.00)
Glucose, Bld: 94 mg/dL (ref 70–99)
Potassium: 4 mEq/L (ref 3.5–5.1)
Sodium: 137 mEq/L (ref 135–145)

## 2019-06-06 MED ORDER — AMPHETAMINE-DEXTROAMPHETAMINE 10 MG PO TABS: 10.0000 mg | ORAL_TABLET | Freq: Two times a day (BID) | ORAL | 0 refills | Status: DC

## 2019-06-06 MED ORDER — HYDROCODONE-ACETAMINOPHEN 10-325 MG PO TABS: 1.0000 | ORAL_TABLET | Freq: Four times a day (QID) | ORAL | 0 refills | Status: DC | PRN

## 2019-06-06 MED ORDER — ALPRAZOLAM 2 MG PO TABS: 2.0000 mg | ORAL_TABLET | Freq: Four times a day (QID) | ORAL | 1 refills | Status: DC | PRN

## 2019-06-06 MED FILL — ALPRAZolam 2 MG TABS: 2 | 30 days supply | Qty: 120 | Fill #0

## 2019-06-06 MED FILL — HYDROCODON-APAP 10-325: 10-325 | 30 days supply | Qty: 120 | Fill #0

## 2019-06-06 MED FILL — AMPHETAMINE-DEXTROAMPHETAMI: 10 | 30 days supply | Qty: 60 | Fill #0

## 2019-06-06 NOTE — Assessment & Plan Note (Signed)
On Adderall 

## 2019-06-06 NOTE — Assessment & Plan Note (Signed)
Azor 

## 2019-06-06 NOTE — Assessment & Plan Note (Signed)
Paxil 

## 2019-06-06 NOTE — Patient Instructions (Signed)

## 2019-06-06 NOTE — Assessment & Plan Note (Signed)
Potential benefits of a long term testosterone use as well as potential risks  and complications were explained to the patient and were aknowledged. On Tetosterone

## 2019-06-06 NOTE — Assessment & Plan Note (Signed)
Relapsing Viberzi

## 2019-06-06 NOTE — Progress Notes (Signed)
Subjective:  Patient ID: Mark Mcgee, male    DOB: 1966/04/23  Age: 53 y.o. MRN: [id number]  CC: No chief complaint on file.   HPI Mark Mcgee presents for depression, anxiety, IBS f/u  Outpatient Medications Prior to Visit  Medication Sig Dispense Refill  . acetaminophen (TYLENOL) 325 MG tablet Take 975 mg by mouth every 6 (six) hours as needed for mild pain or headache.    [name] acyclovir (ZOVIRAX) 400 MG tablet TAKE 1 TABLET BY MOUTH 4 TIMES DAILY (Patient taking differently: Take 400 mg by mouth 4 (four) times daily as needed (break out). TAKE 1 TABLET BY MOUTH 4 TIMES DAILY) 120 tablet 1  . albuterol (VENTOLIN HFA) 108 (90 Base) MCG/ACT inhaler Inhale 2 puffs into the lungs every 4 (four) hours as needed for wheezing or shortness of breath. 18 g 5  . alprazolam (XANAX) 2 MG tablet Take 1 tablet (2 mg total) by mouth 4 (four) times daily as needed for sleep. 120 tablet 1  . amLODipine-olmesartan (AZOR) 10-40 MG tablet Take 1 tablet by mouth daily. 90 tablet 3  . amphetamine-dextroamphetamine (ADDERALL) 10 MG tablet TAKE 1 TABLET (10 MG TOTAL) BY MOUTH 2 (TWO) TIMES DAILY WITH BREAKFAST AND LUNCH. 60 tablet 0  . azithromycin (ZITHROMAX Z-PAK) 250 MG tablet As directed 6 tablet 0  . b complex vitamins tablet Take 1 tablet by mouth daily.      . benzonatate (TESSALON) 200 MG capsule Take 1 capsule (200 mg total) by mouth 3 (three) times daily as needed for cough. 60 capsule 0  . Cholecalciferol (VITAMIN D) 50 MCG (2000 UT) tablet Take 2,000 Units by mouth daily.    . cholestyramine (QUESTRAN) 4 g packet Take 1 packet (4 g total) by mouth 3 (three) times daily with meals. 90 each 3  . dicyclomine (BENTYL) 20 MG tablet Take 1 tablet (20 mg total) by mouth 3 (three) times daily before meals. 90 tablet 1  . diphenoxylate-atropine (LOMOTIL) 2.5-0.025 MG tablet TAKE 1 OR 2 TABLETS BY MOUTH 4 TIMES DAILY AS NEEDED FOR DIARRHEA OR LOOSE STOOLS MAX OF 8 TABLETS PER DAY (Patient taking  differently: Take 1-2 tablets by mouth 4 (four) times daily as needed for diarrhea or loose stools. TAKE 1 OR 2 TABLETS BY MOUTH 4 TIMES DAILY AS NEEDED FOR DIARRHEA OR LOOSE STOOLS MAX OF 8 TABLETS PER DAY) 60 tablet 3  . Eluxadoline (VIBERZI) 100 MG TABS Take 1 tablet (100 mg total) by mouth 2 (two) times daily. 180 tablet 1  . emtricitabine-tenofovir (TRUVADA) 200-300 MG tablet Take 1 tablet by mouth daily. 90 tablet 1  . Flaxseed, Linseed, (FLAX SEEDS) POWD Take by mouth 2 (two) times daily.      [name] glycopyrrolate (ROBINUL) 2 MG tablet Take 1 tablet (2 mg total) by mouth 2 (two) times daily. (Patient taking differently: Take 2 mg by mouth 2 (two) times daily as needed (spasms). ) 180 tablet 0  . halobetasol (ULTRAVATE) 0.05 % ointment Apply topically 2 (two) times daily. (Patient taking differently: Apply 1 application topically 2 (two) times daily as needed (rash). ) 50 g 3  . HYDROcodone-acetaminophen (NORCO) 10-325 MG tablet TAKE 1 TABLET BY MOUTH EVERY 6 HOURS AS NEEDED FOR PAIN 120 tablet 0  . ibuprofen (ADVIL,MOTRIN) 400 MG tablet TAKE 1 TABLET BY MOUTH TWICE A DAY AS NEEDED 60 tablet 2  . ipratropium (ATROVENT) 0.03 % nasal spray Place 2 sprays into both nostrils 2 (two) times daily. 30 mL  5  . loperamide (IMODIUM) 2 MG capsule Take 2 mg by mouth as needed for diarrhea or loose stools.    . methylPREDNISolone (MEDROL DOSEPAK) 4 MG TBPK tablet As directed 21 tablet 0  . Multiple Vitamin (MULTIVITAMIN) capsule Take 1 capsule by mouth daily.     . ondansetron (ZOFRAN) 4 MG tablet Take 1 tablet (4 mg total) by mouth every 8 (eight) hours as needed for nausea or vomiting. 30 tablet 0  . PARoxetine (PAXIL) 10 MG tablet Take 1 tablet (10 mg total) by mouth daily. 90 tablet 1  . saccharomyces boulardii (FLORASTOR) 250 MG capsule Take 1 capsule (250 mg total) by mouth 2 (two) times daily. 60 capsule 3  . tadalafil (CIALIS) 5 MG tablet TAKE 1 TABLET BY MOUTH ONCE DAILY (Patient taking differently:  Take 5 mg by mouth daily. ) 90 tablet 3  . testosterone cypionate (DEPOTESTOSTERONE CYPIONATE) 200 MG/ML injection INJECT 1 ML (200 MG) INTO THE MUSCLE EVERY 14 DAYS (Patient taking differently: Inject 200 mg into the muscle every 14 (fourteen) days. ) 10 mL 5  . triamcinolone ointment (KENALOG) 0.1 % Apply 1 application topically 2 (two) times daily. (Patient taking differently: Apply 1 application topically 2 (two) times daily as needed (rash). ) 80 g 2  . Vitamin D, Ergocalciferol, (DRISDOL) 50000 units CAPS capsule Take 1 capsule (50,000 Units total) by mouth once a week. 8 capsule 0   No facility-administered medications prior to visit.     ROS: Review of Systems  Constitutional: Negative for appetite change, fatigue and unexpected weight change.  HENT: Negative for congestion, nosebleeds, sneezing, sore throat and trouble swallowing.   Eyes: Negative for itching and visual disturbance.  Respiratory: Negative for cough.   Cardiovascular: Negative for chest pain, palpitations and leg swelling.  Gastrointestinal: Negative for abdominal distention, blood in stool, diarrhea and nausea.  Genitourinary: Negative for frequency and hematuria.  Musculoskeletal: Negative for back pain, gait problem, joint swelling and neck pain.  Skin: Negative for rash.  Neurological: Negative for dizziness, tremors, speech difficulty and weakness.  Psychiatric/Behavioral: Negative for agitation, dysphoric mood, sleep disturbance and suicidal ideas. The patient is not nervous/anxious.     Objective:  BP 122/74 (BP Location: Right Arm, Patient Position: Sitting, Cuff Size: Large)   Pulse 88   Temp 97.8 F (36.6 C) (Oral)   Ht 5\' 7"  (1.702 m)   Wt 218 lb (98.9 kg)   SpO2 97%   BMI 34.14 kg/m   BP Readings from Last 3 Encounters:  06/06/19 122/74  03/01/19 116/74  01/22/19 108/70    Wt Readings from Last 3 Encounters:  06/06/19 218 lb (98.9 kg)  03/01/19 222 lb (100.7 kg)  11/28/18 217 lb (98.4  kg)    Physical Exam Constitutional:      General: He is not in acute distress.    Appearance: He is well-developed.     Comments: NAD  Eyes:     Conjunctiva/sclera: Conjunctivae normal.     Pupils: Pupils are equal, round, and reactive to light.  Neck:     Musculoskeletal: Normal range of motion.     Thyroid: No thyromegaly.     Vascular: No JVD.  Cardiovascular:     Rate and Rhythm: Normal rate and regular rhythm.     Heart sounds: Normal heart sounds. No murmur. No friction rub. No gallop.   Pulmonary:     Effort: Pulmonary effort is normal. No respiratory distress.     Breath sounds: Normal breath  sounds. No wheezing or rales.  Chest:     Chest wall: No tenderness.  Abdominal:     General: Bowel sounds are normal. There is no distension.     Palpations: Abdomen is soft. There is no mass.     Tenderness: There is no abdominal tenderness. There is no guarding or rebound.  Musculoskeletal: Normal range of motion.        General: No tenderness.  Lymphadenopathy:     Cervical: No cervical adenopathy.  Skin:    General: Skin is warm and dry.     Findings: No rash.  Neurological:     Mental Status: He is alert and oriented to person, place, and time.     Cranial Nerves: No cranial nerve deficit.     Motor: No abnormal muscle tone.     Coordination: Coordination normal.     Gait: Gait normal.     Deep Tendon Reflexes: Reflexes are normal and symmetric.  Psychiatric:        Behavior: Behavior normal.        Thought Content: Thought content normal.        Judgment: Judgment normal.    Anxious   Lab Results  Component Value Date   WBC 8.0 11/28/2018   HGB 13.3 11/28/2018   HCT 41.3 11/28/2018   PLT 249.0 11/28/2018   GLUCOSE 96 11/28/2018   CHOL 192 11/28/2018   TRIG 165.0 (H) 11/28/2018   HDL 45.30 11/28/2018   LDLDIRECT 127.1 08/30/2012   LDLCALC 114 (H) 11/28/2018   ALT 35 11/28/2018   AST 21 11/28/2018   NA 140 11/28/2018   K 5.0 11/28/2018   CL 104  11/28/2018   CREATININE 0.99 11/28/2018   BUN 17 11/28/2018   CO2 28 11/28/2018   TSH 2.16 11/28/2018   PSA 0.47 11/28/2018    Dg Chest Portable 1 View  Result Date: 01/22/2019 CLINICAL DATA:  Two week history of cough. Acute onset of fever that began today. EXAM: PORTABLE CHEST 1 VIEW COMPARISON:  06/06/2015, 12/19/2016. FINDINGS: Cardiac silhouette and mediastinal contours normal in appearance for the AP portable technique. Pulmonary parenchyma clear. Bronchovascular markings normal. Pulmonary vascularity normal. No pneumothorax. No visible pleural effusions. No interval change. IMPRESSION: No acute cardiopulmonary disease. Stable examination. Electronically Signed   By: Hulan Saas M.D.   On: 01/22/2019 14:48    Assessment & Plan:   There are no diagnoses linked to this encounter.   No orders of the defined types were placed in this encounter.    Follow-up: No follow-ups on file.  Sonda Primes, MD

## 2019-06-09 LAB — HIV ANTIBODY (ROUTINE TESTING W REFLEX): HIV 1&2 Ab, 4th Generation: NONREACTIVE

## 2019-06-09 LAB — HIV-1 RNA QUANT-NO REFLEX-BLD
HIV 1 RNA Quant: <20 copies/mL
HIV-1 RNA Quant, Log: <1.3 Log copies/mL

## 2019-06-18 ENCOUNTER — Other Ambulatory Visit: Payer: Self-pay | Admitting: Internal Medicine

## 2019-07-04 ENCOUNTER — Ambulatory Visit: Payer: Self-pay | Admitting: *Deleted

## 2019-07-04 ENCOUNTER — Other Ambulatory Visit: Payer: Self-pay

## 2019-07-04 ENCOUNTER — Ambulatory Visit (INDEPENDENT_AMBULATORY_CARE_PROVIDER_SITE_OTHER): Payer: 59 | Admitting: Internal Medicine

## 2019-07-04 ENCOUNTER — Encounter: Payer: Self-pay | Admitting: Internal Medicine

## 2019-07-04 DIAGNOSIS — J069 Acute upper respiratory infection, unspecified: Secondary | ICD-10-CM | POA: Diagnosis not present

## 2019-07-04 DIAGNOSIS — J4521 Mild intermittent asthma with (acute) exacerbation: Secondary | ICD-10-CM

## 2019-07-04 MED ORDER — METHYLPREDNISOLONE 4 MG PO TBPK: ORAL_TABLET | ORAL | 0 refills | Status: DC

## 2019-07-04 MED FILL — METHYLPREDNISOLONE 4 MG TBP: 4 | 6 days supply | Qty: 21 | Fill #0

## 2019-07-04 NOTE — Progress Notes (Signed)
Virtual Visit via Video Note  I connected with Mark Mcgee on 07/04/19 at  9:30 AM EDT by a video enabled telemedicine application and verified that I am speaking with the correct person using two identifiers.   I discussed the limitations of evaluation and management by telemedicine and the availability of in person appointments. The patient expressed understanding and agreed to proceed.  History of Present Illness: C/o a low grade fever, HA and chest tightness since Sat. H/o COVID exposure last week - Nicole Kindred and his sister  There has been no runny nose. There is cough, chest pain, chronic abdominal pain, diarrhea, arthralgias Noskin rashes.   Observations/Objective: The patient appears to be in no acute distress, looks tired. No dyspnea  Assessment and Plan:  See my Assessment and Plan. Follow Up Instructions:    I discussed the assessment and treatment plan with the patient. The patient was provided an opportunity to ask questions and all were answered. The patient agreed with the plan and demonstrated an understanding of the instructions.   The patient was advised to call back or seek an in-person evaluation if the symptoms worsen or if the condition fails to improve as anticipated.  I provided face-to-face time during this encounter. We were at different locations.   Walker Kehr, MD

## 2019-07-04 NOTE — Telephone Encounter (Signed)
Pt had virtual this morning

## 2019-07-04 NOTE — Telephone Encounter (Signed)
Patient is calling with fever, chest pressure, headache. Patient states he has had exposure to COVID. Patient advised ED- but wants to have visit first- call to office- they will do virtual visit.  Reason for Disposition . Chest pain or pressure  Answer Assessment - Initial Assessment Questions 1. COVID-19 DIAGNOSIS: "Who made your Coronavirus (COVID-19) diagnosis?" "Was it confirmed by a positive lab test?" If not diagnosed by a HCP, ask "Are there lots of cases (community spread) where you live?" (See public health department website, if unsure)     Symptoms present 2. ONSET: "When did the COVID-19 symptoms start?"      2 days ago 3. WORST SYMPTOM: "What is your worst symptom?" (e.g., cough, fever, shortness of breath, muscle aches)     Chest pressure 4. COUGH: "Do you have a cough?" If so, ask: "How bad is the cough?"       Periodic- not productive 5. FEVER: "Do you have a fever?" If so, ask: "What is your temperature, how was it measured, and when did it start?"     100.1, oral this morning 6. RESPIRATORY STATUS: "Describe your breathing?" (e.g., shortness of breath, wheezing, unable to speak)      Feeling like not getting enough air 7. BETTER-SAME-WORSE: "Are you getting better, staying the same or getting worse compared to yesterday?"  If getting worse, ask, "In what way?"     Getting worse- increase in symptoms 8. HIGH RISK DISEASE: "Do you have any chronic medical problems?" (e.g., asthma, heart or lung disease, weak immune system, etc.)     High blood pressure 9. PREGNANCY: "Is there any chance you are pregnant?" "When was your last menstrual period?"     n/a 10. OTHER SYMPTOMS: "Do you have any other symptoms?"  (e.g., chills, fatigue, headache, loss of smell or taste, muscle pain, sore throat)       Headache, not eating, throat scratchy  Protocols used: CORONAVIRUS (COVID-19) DIAGNOSED OR SUSPECTED-A-AH

## 2019-07-04 NOTE — Assessment & Plan Note (Addendum)
COVID19 exposure - on quarantine  Will test Medrol pac if chest tightness is worse

## 2019-07-04 NOTE — Assessment & Plan Note (Signed)
Medrol pac if chest tightness is worse

## 2019-07-20 MED FILL — ALPRAZolam 2 MG TABS: 2 | 30 days supply | Qty: 120 | Fill #1

## 2019-07-20 MED FILL — PARoxetine HCL 10 MG TABS: 10 | 90 days supply | Qty: 90 | Fill #1

## 2019-07-21 ENCOUNTER — Other Ambulatory Visit: Payer: Self-pay

## 2019-07-21 ENCOUNTER — Ambulatory Visit (INDEPENDENT_AMBULATORY_CARE_PROVIDER_SITE_OTHER): Payer: 59

## 2019-07-21 DIAGNOSIS — Z23 Encounter for immunization: Secondary | ICD-10-CM

## 2019-07-24 ENCOUNTER — Other Ambulatory Visit: Payer: Self-pay

## 2019-07-24 ENCOUNTER — Encounter (HOSPITAL_COMMUNITY): Payer: Self-pay | Admitting: Emergency Medicine

## 2019-07-24 ENCOUNTER — Emergency Department (HOSPITAL_COMMUNITY): Payer: 59

## 2019-07-24 ENCOUNTER — Inpatient Hospital Stay (HOSPITAL_COMMUNITY)
Admission: EM | Admit: 2019-07-24 | Discharge: 2019-08-01 | DRG: 330 | Disposition: A | Discharge status: Home / Self Care | Payer: 59 | Attending: General Surgery | Admitting: General Surgery

## 2019-07-24 DIAGNOSIS — F13239 Sedative, hypnotic or anxiolytic dependence with withdrawal, unspecified: Secondary | ICD-10-CM | POA: Diagnosis present

## 2019-07-24 DIAGNOSIS — Z978 Presence of other specified devices: Secondary | ICD-10-CM

## 2019-07-24 DIAGNOSIS — F4323 Adjustment disorder with mixed anxiety and depressed mood: Secondary | ICD-10-CM | POA: Diagnosis present

## 2019-07-24 DIAGNOSIS — R61 Generalized hyperhidrosis: Secondary | ICD-10-CM | POA: Diagnosis not present

## 2019-07-24 DIAGNOSIS — F988 Other specified behavioral and emotional disorders with onset usually occurring in childhood and adolescence: Secondary | ICD-10-CM | POA: Diagnosis present

## 2019-07-24 DIAGNOSIS — F13231 Sedative, hypnotic or anxiolytic dependence with withdrawal delirium: Secondary | ICD-10-CM

## 2019-07-24 DIAGNOSIS — E039 Hypothyroidism, unspecified: Secondary | ICD-10-CM | POA: Diagnosis present

## 2019-07-24 DIAGNOSIS — R066 Hiccough: Secondary | ICD-10-CM | POA: Diagnosis not present

## 2019-07-24 DIAGNOSIS — R410 Disorientation, unspecified: Secondary | ICD-10-CM

## 2019-07-24 DIAGNOSIS — K580 Irritable bowel syndrome with diarrhea: Secondary | ICD-10-CM | POA: Diagnosis present

## 2019-07-24 DIAGNOSIS — K219 Gastro-esophageal reflux disease without esophagitis: Secondary | ICD-10-CM | POA: Diagnosis present

## 2019-07-24 DIAGNOSIS — Z91018 Allergy to other foods: Secondary | ICD-10-CM

## 2019-07-24 DIAGNOSIS — K567 Ileus, unspecified: Secondary | ICD-10-CM | POA: Diagnosis not present

## 2019-07-24 DIAGNOSIS — R140 Abdominal distension (gaseous): Secondary | ICD-10-CM | POA: Diagnosis not present

## 2019-07-24 DIAGNOSIS — E0590 Thyrotoxicosis, unspecified without thyrotoxic crisis or storm: Secondary | ICD-10-CM | POA: Diagnosis present

## 2019-07-24 DIAGNOSIS — N400 Enlarged prostate without lower urinary tract symptoms: Secondary | ICD-10-CM | POA: Diagnosis present

## 2019-07-24 DIAGNOSIS — I10 Essential (primary) hypertension: Secondary | ICD-10-CM | POA: Diagnosis not present

## 2019-07-24 DIAGNOSIS — R1084 Generalized abdominal pain: Secondary | ICD-10-CM | POA: Diagnosis not present

## 2019-07-24 DIAGNOSIS — Z79899 Other long term (current) drug therapy: Secondary | ICD-10-CM

## 2019-07-24 DIAGNOSIS — Z4682 Encounter for fitting and adjustment of non-vascular catheter: Secondary | ICD-10-CM | POA: Diagnosis not present

## 2019-07-24 DIAGNOSIS — R1031 Right lower quadrant pain: Secondary | ICD-10-CM | POA: Diagnosis not present

## 2019-07-24 DIAGNOSIS — K3532 Acute appendicitis with perforation and localized peritonitis, without abscess: Principal | ICD-10-CM | POA: Diagnosis present

## 2019-07-24 DIAGNOSIS — F05 Delirium due to known physiological condition: Secondary | ICD-10-CM | POA: Diagnosis not present

## 2019-07-24 DIAGNOSIS — R110 Nausea: Secondary | ICD-10-CM | POA: Diagnosis not present

## 2019-07-24 DIAGNOSIS — Z91041 Radiographic dye allergy status: Secondary | ICD-10-CM | POA: Diagnosis not present

## 2019-07-24 DIAGNOSIS — Z20828 Contact with and (suspected) exposure to other viral communicable diseases: Secondary | ICD-10-CM | POA: Diagnosis present

## 2019-07-24 DIAGNOSIS — Z888 Allergy status to other drugs, medicaments and biological substances status: Secondary | ICD-10-CM

## 2019-07-24 DIAGNOSIS — Z886 Allergy status to analgesic agent status: Secondary | ICD-10-CM | POA: Diagnosis not present

## 2019-07-24 DIAGNOSIS — K5989 Other specified functional intestinal disorders: Secondary | ICD-10-CM | POA: Diagnosis not present

## 2019-07-24 DIAGNOSIS — F13931 Sedative, hypnotic or anxiolytic use, unspecified with withdrawal delirium: Secondary | ICD-10-CM

## 2019-07-24 DIAGNOSIS — R52 Pain, unspecified: Secondary | ICD-10-CM | POA: Diagnosis not present

## 2019-07-24 DIAGNOSIS — R443 Hallucinations, unspecified: Secondary | ICD-10-CM | POA: Diagnosis present

## 2019-07-24 DIAGNOSIS — J45909 Unspecified asthma, uncomplicated: Secondary | ICD-10-CM | POA: Diagnosis not present

## 2019-07-24 DIAGNOSIS — F410 Panic disorder [episodic paroxysmal anxiety] without agoraphobia: Secondary | ICD-10-CM | POA: Diagnosis present

## 2019-07-24 DIAGNOSIS — R109 Unspecified abdominal pain: Secondary | ICD-10-CM | POA: Diagnosis not present

## 2019-07-24 DIAGNOSIS — E876 Hypokalemia: Secondary | ICD-10-CM | POA: Diagnosis not present

## 2019-07-24 DIAGNOSIS — D649 Anemia, unspecified: Secondary | ICD-10-CM | POA: Diagnosis not present

## 2019-07-24 DIAGNOSIS — Z4659 Encounter for fitting and adjustment of other gastrointestinal appliance and device: Secondary | ICD-10-CM

## 2019-07-24 LAB — COMPREHENSIVE METABOLIC PANEL
ALT: 21 U/L (ref 0–44)
AST: 15 U/L (ref 15–41)
Albumin: 4.5 g/dL (ref 3.5–5.0)
Alkaline Phosphatase: 65 U/L (ref 38–126)
Anion gap: 15 (ref 5–15)
BUN: 10 mg/dL (ref 6–20)
CO2: 21 mmol/L — ABNORMAL LOW (ref 22–32)
Calcium: 9.2 mg/dL (ref 8.9–10.3)
Chloride: 103 mmol/L (ref 98–111)
Creatinine, Ser: 1.51 mg/dL — ABNORMAL HIGH (ref 0.61–1.24)
GFR calc Af Amer: >60 mL/min (ref >60)
GFR calc non Af Amer: 52 mL/min — ABNORMAL LOW (ref >60)
Glucose, Bld: 146 mg/dL — ABNORMAL HIGH (ref 70–99)
Potassium: 3.9 mmol/L (ref 3.5–5.1)
Sodium: 139 mmol/L (ref 135–145)
Total Bilirubin: 0.9 mg/dL (ref 0.3–1.2)
Total Protein: 7 g/dL (ref 6.5–8.1)

## 2019-07-24 LAB — CBC
HCT: 45.2 % (ref 39.0–52.0)
Hemoglobin: 14.6 g/dL (ref 13.0–17.0)
MCH: 26.5 pg (ref 26.0–34.0)
MCHC: 32.3 g/dL (ref 30.0–36.0)
MCV: 82 fL (ref 80.0–100.0)
Platelets: 262 10*3/uL (ref 150–400)
RBC: 5.51 MIL/uL (ref 4.22–5.81)
RDW: 13.2 % (ref 11.5–15.5)
WBC: 15.6 10*3/uL — ABNORMAL HIGH (ref 4.0–10.5)
nRBC: 0.1 % (ref 0.0–0.2)

## 2019-07-24 LAB — URINALYSIS, ROUTINE W REFLEX MICROSCOPIC
Bacteria, UA: NONE SEEN
Bilirubin Urine: NEGATIVE
Glucose, UA: NEGATIVE mg/dL
Hgb urine dipstick: NEGATIVE
Ketones, ur: 20 mg/dL — AB
Leukocytes,Ua: NEGATIVE
Nitrite: NEGATIVE
Protein, ur: 30 mg/dL — AB
Specific Gravity, Urine: 1.043 — ABNORMAL HIGH (ref 1.005–1.030)
pH: 5 (ref 5.0–8.0)

## 2019-07-24 LAB — SARS CORONAVIRUS 2 BY RT PCR (HOSPITAL ORDER, PERFORMED IN ~~LOC~~ HOSPITAL LAB): SARS Coronavirus 2: NEGATIVE

## 2019-07-24 LAB — LIPASE, BLOOD: Lipase: 25 U/L (ref 11–51)

## 2019-07-24 MED ORDER — HYDROMORPHONE HCL 1 MG/ML IJ SOLN
1.0000 mg | Freq: Once | INTRAMUSCULAR | Status: AC
  Administered 2019-07-24: 1 mg via INTRAVENOUS
  Filled 2019-07-24: qty 1

## 2019-07-24 MED ORDER — DIPHENHYDRAMINE HCL 50 MG/ML IJ SOLN
25.0000 mg | Freq: Once | INTRAMUSCULAR | Status: AC
  Administered 2019-07-24: 25 mg via INTRAVENOUS
  Filled 2019-07-24: qty 1

## 2019-07-24 MED ORDER — ONDANSETRON HCL 4 MG/2ML IJ SOLN
4.0000 mg | Freq: Once | INTRAMUSCULAR | Status: AC
  Administered 2019-07-24: 4 mg via INTRAVENOUS
  Filled 2019-07-24: qty 2

## 2019-07-24 MED ORDER — SODIUM CHLORIDE 0.9% FLUSH: 3.0000 mL | Freq: Once | INTRAVENOUS | Status: DC

## 2019-07-24 MED ORDER — SODIUM CHLORIDE 0.9 % IV BOLUS
1000.0000 mL | Freq: Once | INTRAVENOUS | Status: AC
  Administered 2019-07-24: 1000 mL via INTRAVENOUS

## 2019-07-24 MED ORDER — ONDANSETRON HCL 4 MG/2ML IJ SOLN
4.0000 mg | Freq: Once | INTRAMUSCULAR | Status: AC
  Administered 2019-07-24: 16:00:00 4 mg via INTRAVENOUS
  Filled 2019-07-24: qty 2

## 2019-07-24 MED ORDER — FENTANYL CITRATE (PF) 100 MCG/2ML IJ SOLN
50.0000 ug | Freq: Once | INTRAMUSCULAR | Status: AC
  Administered 2019-07-24: 50 ug via INTRAVENOUS
  Filled 2019-07-24: qty 2

## 2019-07-24 MED ORDER — MORPHINE SULFATE (PF) 4 MG/ML IV SOLN
4.0000 mg | Freq: Once | INTRAVENOUS | Status: AC
  Administered 2019-07-24: 4 mg via INTRAVENOUS
  Filled 2019-07-24: qty 1

## 2019-07-24 MED ORDER — HYDROMORPHONE HCL 1 MG/ML IJ SOLN
1.0000 mg | INTRAMUSCULAR | Status: DC | PRN
  Administered 2019-07-25 (×3): 1 mg via INTRAVENOUS
  Filled 2019-07-24 (×2): qty 1

## 2019-07-24 MED ORDER — IOHEXOL 300 MG/ML  SOLN
100.0000 mL | Freq: Once | INTRAMUSCULAR | Status: AC | PRN
  Administered 2019-07-24: 100 mL via INTRAVENOUS

## 2019-07-24 MED ORDER — PIPERACILLIN-TAZOBACTAM 3.375 G IVPB 30 MIN
3.3750 g | Freq: Once | INTRAVENOUS | Status: AC
  Administered 2019-07-24: 3.375 g via INTRAVENOUS
  Filled 2019-07-24: qty 50

## 2019-07-24 NOTE — H&P (Signed)
Chief Complaint:  Right lower quadrant pain since Friday night (4 days)  History of Present Illness:  Mark Mcgee is an 53 y.o. male who is followed by Dr. Fuller Plan with IBS has had constipation and lower abdominal pain since Friday.  The pain grew in intensitiy and he presented to the [facility] ER where a CT scan revealed a perforated appendicitis with phlegmon and fluid (no definable abscess found).  He is very uncomfortable and tender and after discussing management strategies he would like to proceed with lap/open appendectomy.  Will make arrangements for that to occur per Dr. Donne Hazel.    Past Medical History:  Diagnosis Date  . Anxiety   . Costochondritis    recurrent   . Depression    Dr Sabra Heck  . Fatigue 2009  . Genital herpes 2009  . GERD (gastroesophageal reflux disease)   . Heat stroke   . HTN (hypertension)   . Hyperthyroidism   . IBS (irritable bowel syndrome)   . Panic attack   . Prostatitis    Very painful flare-ups Dr Diona Fanti  . Tremor     Past Surgical History:  Procedure Laterality Date  . MANDIBLE SURGERY     Right    Current Facility-Administered Medications  Medication Dose Route Frequency Provider Last Rate Last Dose  . sodium chloride flush (NS) 0.9 % injection 3 mL  3 mL Intravenous Once Tegeler, Gwenyth Allegra, MD       Current Outpatient Medications  Medication Sig Dispense Refill  . acetaminophen (TYLENOL) 325 MG tablet Take 325-650 mg by mouth every 6 (six) hours as needed for mild pain or headache.     [name] acyclovir (ZOVIRAX) 400 MG tablet TAKE 1 TABLET BY MOUTH 4 TIMES DAILY (Patient taking differently: Take 400 mg by mouth 4 (four) times daily as needed (as directed for outbreaks). ) 120 tablet 1  . albuterol (VENTOLIN HFA) 108 (90 Base) MCG/ACT inhaler Inhale 2 puffs into the lungs every 4 (four) hours as needed for wheezing or shortness of breath. 18 g 5  . alprazolam (XANAX) 2 MG tablet Take 1 tablet (2 mg total) by mouth 4 (four) times daily as  needed for sleep. (Patient taking differently: Take 2 mg by mouth 4 (four) times daily as needed for sleep or anxiety. ) 120 tablet 1  . amLODipine-olmesartan (AZOR) 10-40 MG tablet Take 1 tablet by mouth daily. 90 tablet 3  . amphetamine-dextroamphetamine (ADDERALL) 10 MG tablet Take 1 tablet (10 mg total) by mouth 2 (two) times daily with breakfast and lunch. (Patient taking differently: Take 10 mg by mouth daily with breakfast. ) 60 tablet 0  . b complex vitamins tablet Take 1 tablet by mouth daily.      . Cholecalciferol (VITAMIN D3) 50 MCG (2000 UT) TABS Take 2,000 Units by mouth daily with breakfast.    . diphenhydrAMINE (BENADRYL) 25 mg capsule Take 25 mg by mouth every 6 (six) hours as needed (for seasonal allergies).    . diphenoxylate-atropine (LOMOTIL) 2.5-0.025 MG tablet TAKE 1 OR 2 TABLETS BY MOUTH 4 TIMES DAILY AS NEEDED FOR DIARRHEA OR LOOSE STOOLS MAX OF 8 TABLETS PER DAY (Patient taking differently: Take 1-2 tablets by mouth 4 (four) times daily as needed for diarrhea or loose stools (MAX OF 8 TABLETS/24 HOURS). ) 60 tablet 3  . emtricitabine-tenofovir (TRUVADA) 200-300 MG tablet Take 1 tablet by mouth daily. 90 tablet 1  . Flaxseed, Linseed, (FLAX SEEDS) POWD 2 (two) times daily as needed (as directed  when mixing into health shakes).     [name] glycopyrrolate (ROBINUL) 2 MG tablet Take 1 tablet (2 mg total) by mouth 2 (two) times daily. (Patient taking differently: Take 2 mg by mouth 2 (two) times daily as needed (spasms). ) 180 tablet 0  . halobetasol (ULTRAVATE) 0.05 % ointment Apply topically 2 (two) times daily. (Patient taking differently: Apply 1 application topically 2 (two) times daily as needed (for rashes). ) 50 g 3  . HYDROcodone-acetaminophen (NORCO) 10-325 MG tablet Take 1 tablet by mouth every 6 (six) hours as needed. for pain 120 tablet 0  . ipratropium (ATROVENT) 0.03 % nasal spray Place 2 sprays into both nostrils 2 (two) times daily. (Patient taking differently: Place 2  sprays into both nostrils 2 (two) times daily as needed (for seasonal allergies). ) 30 mL 5  . Multiple Vitamin (MULTIVITAMIN) capsule Take 1 capsule by mouth daily.     . ondansetron (ZOFRAN) 4 MG tablet Take 1 tablet (4 mg total) by mouth every 8 (eight) hours as needed for nausea or vomiting. 30 tablet 0  . PARoxetine (PAXIL) 10 MG tablet Take 1 tablet (10 mg total) by mouth daily. 90 tablet 1  . tadalafil (CIALIS) 5 MG tablet TAKE 1 TABLET BY MOUTH ONCE DAILY (Patient taking differently: Take 5 mg by mouth daily. ) 90 tablet 3  . testosterone cypionate (DEPOTESTOSTERONE CYPIONATE) 200 MG/ML injection INJECT 1 ML (200 MG) INTO THE MUSCLE EVERY 14 DAYS (Patient taking differently: Inject 200 mg into the muscle every 14 (fourteen) days. ) 10 mL 5  . triamcinolone ointment (KENALOG) 0.1 % Apply 1 application topically 2 (two) times daily. (Patient taking differently: Apply 1 application topically 2 (two) times daily as needed (for rashes). ) 80 g 2  . zinc gluconate 50 MG tablet Take 100 mg by mouth daily.    [name] azithromycin (ZITHROMAX Z-PAK) 250 MG tablet As directed (Patient not taking: Reported on 07/24/2019) 6 tablet 0  . benzonatate (TESSALON) 200 MG capsule Take 1 capsule (200 mg total) by mouth 3 (three) times daily as needed for cough. (Patient not taking: Reported on 07/24/2019) 60 capsule 0  . cholestyramine (QUESTRAN) 4 g packet Take 1 packet (4 g total) by mouth 3 (three) times daily with meals. 90 each 3  . dicyclomine (BENTYL) 20 MG tablet Take 1 tablet (20 mg total) by mouth 3 (three) times daily before meals. 90 tablet 1  . Eluxadoline (VIBERZI) 100 MG TABS Take 1 tablet (100 mg total) by mouth 2 (two) times daily. (Patient not taking: Reported on 07/24/2019) 180 tablet 1  . ibuprofen (ADVIL,MOTRIN) 400 MG tablet TAKE 1 TABLET BY MOUTH TWICE A DAY AS NEEDED (Patient not taking: Reported on 07/24/2019) 60 tablet 2  . loperamide (IMODIUM) 2 MG capsule Take 2 mg by mouth as needed for  diarrhea or loose stools.    . methylPREDNISolone (MEDROL DOSEPAK) 4 MG TBPK tablet As directed 21 tablet 0  . saccharomyces boulardii (FLORASTOR) 250 MG capsule Take 1 capsule (250 mg total) by mouth 2 (two) times daily. (Patient not taking: Reported on 07/24/2019) 60 capsule 3  . Vitamin D, Ergocalciferol, (DRISDOL) 50000 units CAPS capsule Take 1 capsule (50,000 Units total) by mouth once a week. (Patient not taking: Reported on 07/24/2019) 8 capsule 0   Contrast media [iodinated diagnostic agents], Buspirone hcl, Divalproex sodium, Olanzapine-fluoxetine hcl, Piroxicam, Wheat bran, and Ibuprofen Family History  Problem Relation Age of Onset  . Hyperlipidemia Other   . Coronary artery  disease Other   . Stroke Mother   . Colon cancer Paternal Grandmother   . Diabetes Other   . Breast cancer Other   . Pancreatic cancer Other   . Cancer Other        breast, pancreatic  . Heart disease Other   . Cancer Maternal Grandmother        colon   Social History:   reports that he has never smoked. He has never used smokeless tobacco. He reports current alcohol use. He reports that he does not use drugs.   REVIEW OF SYSTEMS : Negative except for see problem list--no prior abdominal surgeries  Physical Exam:   Blood pressure 136/87, pulse (!) 108, temperature 99.4 F (37.4 C), temperature source Oral, resp. rate 16, height 5\' 7"  (1.702 m), weight 91.2 kg, SpO2 96 %. Body mass index is 31.48 kg/m.  Gen:  WDWN WM NAD  Neurological: Alert and oriented to person, place, and time. Motor and sensory function is grossly intact  Head: Normocephalic and atraumatic.  Eyes: Conjunctivae are normal. Pupils are equal, round, and reactive to light. No scleral icterus.  Neck: Normal range of motion. Neck supple. No tracheal deviation or thyromegaly present.  Cardiovascular:  SR without murmurs or gallops.  No carotid bruits Breast:  Not examined Respiratory: Effort normal.  No respiratory distress. No  chest wall tenderness. Breath sounds normal.  No wheezes, rales or rhonchi.  Abdomen:  Tender in the right lower quadrant with guarding GU:  Normal male genitalia Musculoskeletal: Normal range of motion. Extremities are nontender. No cyanosis, edema or clubbing noted Lymphadenopathy: No cervical, preauricular, postauricular or axillary adenopathy is present Skin: Skin is warm and dry. No rash noted. No diaphoresis. No erythema. No pallor. Pscyh: Normal mood and affect. Behavior is normal. Judgment and thought content normal.   LABORATORY RESULTS: Results for orders placed or performed during the hospital encounter of 07/24/19 (from the past 48 hour(s))  Lipase, blood     Status: None   Collection Time: 07/24/19 10:55 AM  Result Value Ref Range   Lipase 25 11 - 51 U/L    Comment: Performed at [facility], [address], [place], [place]  Comprehensive metabolic panel     Status: Abnormal   Collection Time: 07/24/19 10:55 AM  Result Value Ref Range   Sodium 139 135 - 145 mmol/L   Potassium 3.9 3.5 - 5.1 mmol/L   Chloride 103 98 - 111 mmol/L   CO2 21 (L) 22 - 32 mmol/L   Glucose, Bld 146 (H) 70 - 99 mg/dL   BUN 10 6 - 20 mg/dL   Creatinine, Ser 07/26/19 (H) 0.61 - 1.24 mg/dL   Calcium 9.2 8.9 - 3.29 mg/dL   Total Protein 7.0 6.5 - 8.1 g/dL   Albumin 4.5 3.5 - 5.0 g/dL   AST 15 15 - 41 U/L   ALT 21 0 - 44 U/L   Alkaline Phosphatase 65 38 - 126 U/L   Total Bilirubin 0.9 0.3 - 1.2 mg/dL   GFR calc non Af Amer 52 (L) >60 mL/min   GFR calc Af Amer >60 >60 mL/min   Anion gap 15 5 - 15    Comment: Performed at [facility], [address], [place], [place]  CBC     Status: Abnormal   Collection Time: 07/24/19 10:55 AM  Result Value Ref Range   WBC 15.6 (H) 4.0 - 10.5 K/uL   RBC 5.51 4.22 -  5.81 MIL/uL   Hemoglobin 14.6 13.0 - 17.0 g/dL   HCT 16.1 09.6 - 04.5 %   MCV 82.0 80.0 - 100.0 fL   MCH 26.5 26.0 - 34.0 pg   MCHC 32.3 30.0 - 36.0 g/dL   RDW 40.9  81.1 - 91.4 %   Platelets 262 150 - 400 K/uL   nRBC 0.1 0.0 - 0.2 %    Comment: Performed at [facility], [address], [place], [place] [address]  SARS Coronavirus 2 by RT PCR (hospital order, performed in [facility] hospital lab) Nasopharyngeal Nasopharyngeal Swab     Status: None   Collection Time: 07/24/19  8:00 PM   Specimen: Nasopharyngeal Swab  Result Value Ref Range   SARS Coronavirus 2 NEGATIVE NEGATIVE    Comment: (NOTE) If result is NEGATIVE SARS-CoV-2 target nucleic acids are NOT DETECTED. The SARS-CoV-2 RNA is generally detectable in upper and lower  respiratory specimens during the acute phase of infection. The lowest  concentration of SARS-CoV-2 viral copies this assay can detect is 250  copies / mL. A negative result does not preclude SARS-CoV-2 infection  and should not be used as the sole basis for treatment or other  patient management decisions.  A negative result may occur with  improper specimen collection / handling, submission of specimen other  than nasopharyngeal swab, presence of viral mutation(s) within the  areas targeted by this assay, and inadequate number of viral copies  (<250 copies / mL). A negative result must be combined with clinical  observations, patient history, and epidemiological information. If result is POSITIVE SARS-CoV-2 target nucleic acids are DETECTED. The SARS-CoV-2 RNA is generally detectable in upper and lower  respiratory specimens dur ing the acute phase of infection.  Positive  results are indicative of active infection with SARS-CoV-2.  Clinical  correlation with patient history and other diagnostic information is  necessary to determine patient infection status.  Positive results do  not rule out bacterial infection or co-infection with other viruses. If result is PRESUMPTIVE POSTIVE SARS-CoV-2 nucleic acids MAY BE PRESENT.   A presumptive positive result was obtained on the submitted specimen  and confirmed on  repeat testing.  While 2019 novel coronavirus  (SARS-CoV-2) nucleic acids may be present in the submitted sample  additional confirmatory testing may be necessary for epidemiological  and / or clinical management purposes  to differentiate between  SARS-CoV-2 and other Sarbecovirus currently known to infect humans.  If clinically indicated additional testing with an alternate test  methodology [phone]) is advised. The SARS-CoV-2 RNA is generally  detectable in upper and lower respiratory sp ecimens during the acute  phase of infection. The expected result is Negative. Fact Sheet for Patients:  BoilerBrush.com.cy Fact Sheet for Healthcare Providers: https://pope.com/ This test is not yet approved or cleared by the [place] FDA and has been authorized for detection and/or diagnosis of SARS-CoV-2 by FDA under an Emergency Use Authorization (EUA).  This EUA will remain in effect (meaning this test can be used) for the duration of the COVID-19 declaration under Section 564(b)(1) of the Act, 21 U.S.C. section 360bbb-3(b)(1), unless the authorization is terminated or revoked sooner. Performed at [facility], [address], [place], [place] [address]      RADIOLOGY RESULTS: Ct Abdomen Pelvis W Contrast  Result Date: 07/24/2019 CLINICAL DATA:  Right lower quadrant pain for 3 days, constipation EXAM: CT ABDOMEN AND PELVIS WITH CONTRAST TECHNIQUE: Multidetector CT imaging of the abdomen and pelvis  was performed using the standard protocol following bolus administration of intravenous contrast. CONTRAST:  OMNIPAQUE IOHEXOL 300 MG/ML  SOLN COMPARISON:  CT abdomen pelvis 03/29/2017 FINDINGS: Lower chest: Basilar atelectatic changes. Lung bases otherwise clear. Normal heart size. No pericardial effusion. Hepatobiliary: No focal liver abnormality is seen. Prominent fold at the neck of the gallbladder. No calcified gallstones,  pericholecystic inflammation or biliary ductal dilatation. Pancreas: Partial fatty replacement of the pancreas. No peripancreatic inflammation or ductal dilatation. Spleen: Normal in size without focal abnormality. Adrenals/Urinary Tract: Normal adrenal glands. 11 mm hypoattenuating simple appearing fluid attenuation cyst in the interpolar right kidney. Kidneys are otherwise unremarkable, without renal calculi, suspicious lesion, or hydronephrosis. Bladder is unremarkable. No extravasation of contrast is seen on excretory phase delayed imaging. Stomach/Bowel: Dilated air and fluid-filled appendix with peripheral hyperemia measuring up to 2.3 cm in diameter. Small amount extraluminal gas extending from the base of the appendix (3/70) as well as surrounding fluid and phlegmonous change. No discrete or organized abscess has formed at this time. The air is reactive thickening of the cecal tip as well as the adjacent ileum which courses in close proximity. No resulting obstruction in the inflamed portions of the bowel. Distal esophagus, stomach and more proximal small bowel are unremarkable. More distal colonic segments are free of acute abnormality. Vascular/Lymphatic: The aorta is normal caliber. Reactive adenopathy in the right lower quadrant. No suspicious or enlarged lymph nodes in the included lymphatic chains. Reproductive: The prostate and seminal vesicles are unremarkable. Other: There is extensive fluid and phlegmonous change centered upon the inflamed appendix in the right lower quadrant. Additional intermediate attenuation (18-25 HU) fluid is seen in the low pelvis and left upper quadrant, likely redistributed. Additional foci of anti dependent gas are noted along the anterior peritoneal surface. Some peritoneal thickening is noted in the right and left lower quadrants compatible with developing peritonitis. Musculoskeletal: No acute osseous abnormality or suspicious osseous lesion. IMPRESSION: 1. Perforated  appendicitis with extensive fluid and phlegmonous change centered upon the inflamed appendix in the right lower quadrant. No discrete or organized abscess has formed at this time. Evidence of developing peritonitis. 2. Reactive thickening of the cecal tip as well as the adjacent ileum which courses in close proximity. No resulting obstruction in the inflamed portions of the bowel. Electronically Signed   By: Kreg Shropshire M.D.   On: 07/24/2019 19:39   Dg Abd Portable 1 View  Result Date: 07/24/2019 CLINICAL DATA:  Abdomen pain EXAM: PORTABLE ABDOMEN - 1 VIEW COMPARISON:  CT abdomen pelvis March 29, 2017 FINDINGS: The bowel gas pattern is normal. No radio-opaque calculi or other significant radiographic abnormality are seen. IMPRESSION: No bowel obstruction. Electronically Signed   By: Sherian Rein M.D.   On: 07/24/2019 18:42    Problem List: Patient Active Problem List   Diagnosis Date Noted  . Stress and adjustment reaction 03/01/2019  . Febrile illness 02/15/2019  . 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 Therapeutic drug monitoring 08/24/2017  . Right arm pain 06/15/2017  . Exposure to blood or body fluid 05/18/2017  . Arthralgia 07/02/2016  . IBS (irritable bowel syndrome) 03/02/2015  . Well adult exam 08/07/2014  . Chest pain, atypical 08/31/2012  . URI (upper respiratory infection) 05/22/2012  . Serologic abnormality 11/15/2011  . BPH (benign prostatic hyperplasia) 08/23/2011  . 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 LUMP OR MASS IN BREAST 11/04/2010  . RENAL COLIC 10/22/2010  . Prostatitis 11/10/2009  .  ANEMIA-UNSPECIFIED 03/10/2009  . ABDOMINAL PAIN, LEFT LOWER QUADRANT 03/10/2009  . PANIC DISORDER 02/11/2009  . Incontinence of feces 02/11/2009  . OTHER GENITAL HERPES 08/26/2008  . OTHER SPECIFIED DISORDER OF SKIN 08/26/2008  . DYSURIA 08/16/2008  . LOW BACK PAIN 08/05/2008  . Asthmatic bronchitis 06/28/2008  .  Chronic fatigue 04/16/2008  . FLATULENCE 01/09/2008  . INSOMNIA, PERSISTENT 10/10/2007  . CHEST WALL PAIN 10/10/2007  . Diarrhea 10/10/2007  . Anxiety disorder 08/29/2007  . HYPERTENSION 08/29/2007  . Adjustment disorder with mixed anxiety and depressed mood 06/27/2007    Assessment & Plan: Acute appendicitis with perforation and peritonitis.  For lap/open appendectomy at the discretion of Dr. Dwain SarnaWakefield.      Matt B. Daphine DeutscherMartin, MD, [facility]  [facility] [phone] beeper [phone]  07/24/2019 9:42 PM

## 2019-07-24 NOTE — ED Provider Notes (Signed)
[facility] EMERGENCY DEPARTMENT Provider Note   CSN: [id number] Arrival date & time: 07/24/19  1028     History   Chief Complaint Chief Complaint  Patient presents with   Abdominal Pain    HPI Mark Mcgee is a 53 y.o. male past medical history of GERD, hypertension, hypothyroidism, IBS who presents for evaluation of 3 days of abdominal pain.  He states initially, he was having some pain to the periumbilical region of his abdomen but has since gone to the right lower quadrant.  He states he has not had much of an appetite over the last 3 days.  He had had some nausea but denies any vomiting post morning.  He had a few episodes of nonbloody, nonbilious emesis.  He also reports he has been constipated over the last 3 days which he states is abnormal for him.  He has a history of IBS-D and states that he normally has diarrhea.  He states he did cannot remember if he is still been passing gas.  No history of abdominal surgeries.  He has not noted any fever.  He reports the pain makes him not want to take deep breaths but has not any trouble breathing, chest pain, cough.  He has not had any urinary complaints.     The history is provided by the patient.    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   Therapeutic drug monitoring 08/24/2017   Right arm pain 06/15/2017   Exposure to blood or body fluid 05/18/2017   Arthralgia  07/02/2016   IBS (irritable bowel syndrome) 03/02/2015   Well adult exam 08/07/2014   Chest pain, atypical 08/31/2012   URI (upper respiratory infection) 05/22/2012   Serologic abnormality 11/15/2011   BPH (benign prostatic hyperplasia) 08/23/2011   Hypogonadism male 12/22/2010   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   LUMP OR MASS IN BREAST 44/09/270   RENAL COLIC 53/66/4403   Prostatitis 11/10/2009   ANEMIA-UNSPECIFIED 03/10/2009   ABDOMINAL PAIN, LEFT LOWER QUADRANT 03/10/2009   PANIC DISORDER 02/11/2009   Incontinence of feces 02/11/2009   OTHER GENITAL HERPES 08/26/2008   OTHER SPECIFIED DISORDER OF SKIN 08/26/2008   DYSURIA 08/16/2008   LOW BACK PAIN 08/05/2008   Asthmatic bronchitis 06/28/2008   Chronic fatigue 04/16/2008   FLATULENCE 01/09/2008   INSOMNIA, PERSISTENT 10/10/2007   CHEST WALL PAIN 10/10/2007   Diarrhea 10/10/2007   Anxiety disorder 08/29/2007   HYPERTENSION 08/29/2007   Adjustment disorder with mixed anxiety and depressed mood 06/27/2007    Past Surgical History:  Procedure Laterality Date   MANDIBLE SURGERY     Right        Home Medications    Prior to Admission medications   Medication Sig Start Date End Date Taking? Authorizing Provider  acetaminophen (TYLENOL) 325 MG tablet Take 325-650 mg  by mouth every 6 (six) hours as needed for mild pain or headache.    Yes [provider]  acyclovir (ZOVIRAX) 400 MG tablet TAKE 1 TABLET BY MOUTH 4 TIMES DAILY Patient taking differently: Take 400 mg by mouth 4 (four) times daily as needed (as directed for outbreaks).  03/03/18  Yes Plotnikov, Georgina Quint, MD  albuterol (VENTOLIN HFA) 108 (90 Base) MCG/ACT inhaler Inhale 2 puffs into the lungs every 4 (four) hours as needed for wheezing or shortness of breath. 04/12/19  Yes Plotnikov, Georgina Quint, MD  alprazolam Prudy Feeler) 2 MG tablet Take 1 tablet (2 mg total) by mouth 4 (four) times daily as needed  for sleep. Patient taking differently: Take 2 mg by mouth 4 (four) times daily as needed for sleep or anxiety.  06/06/19  Yes Plotnikov, Georgina Quint, MD  amLODipine-olmesartan (AZOR) 10-40 MG tablet Take 1 tablet by mouth daily. 11/28/18  Yes Plotnikov, Georgina Quint, MD  amphetamine-dextroamphetamine (ADDERALL) 10 MG tablet Take 1 tablet (10 mg total) by mouth 2 (two) times daily with breakfast and lunch. Patient taking differently: Take 10 mg by mouth daily with breakfast.  06/06/19  Yes Plotnikov, Georgina Quint, MD  b complex vitamins tablet Take 1 tablet by mouth daily.     Yes [provider]  Cholecalciferol (VITAMIN D3) 50 MCG (2000 UT) TABS Take 2,000 Units by mouth daily with breakfast.   Yes [provider]  diphenhydrAMINE (BENADRYL) 25 mg capsule Take 25 mg by mouth every 6 (six) hours as needed (for seasonal allergies).   Yes [provider]  diphenoxylate-atropine (LOMOTIL) 2.5-0.025 MG tablet TAKE 1 OR 2 TABLETS BY MOUTH 4 TIMES DAILY AS NEEDED FOR DIARRHEA OR LOOSE STOOLS MAX OF 8 TABLETS PER DAY Patient taking differently: Take 1-2 tablets by mouth 4 (four) times daily as needed for diarrhea or loose stools (MAX OF 8 TABLETS/24 HOURS).  02/02/17  Yes Plotnikov, Georgina Quint, MD  emtricitabine-tenofovir (TRUVADA) 200-300 MG tablet Take 1 tablet by mouth daily. 01/04/19  Yes Jegede, Olugbemiga E, MD  Flaxseed, Linseed, (FLAX SEEDS) POWD 2 (two) times daily as needed (as directed when mixing into health shakes).    Yes [provider]  glycopyrrolate (ROBINUL) 2 MG tablet Take 1 tablet (2 mg total) by mouth 2 (two) times daily. Patient taking differently: Take 2 mg by mouth 2 (two) times daily as needed (spasms).  02/02/17  Yes Plotnikov, Georgina Quint, MD  halobetasol (ULTRAVATE) 0.05 % ointment Apply topically 2 (two) times daily. Patient taking differently: Apply 1 application topically 2 (two) times daily as needed (for rashes).  03/03/18  Yes Plotnikov, Georgina Quint, MD    HYDROcodone-acetaminophen (NORCO) 10-325 MG tablet Take 1 tablet by mouth every 6 (six) hours as needed. for pain 06/06/19  Yes Plotnikov, Georgina Quint, MD  ipratropium (ATROVENT) 0.03 % nasal spray Place 2 sprays into both nostrils 2 (two) times daily. Patient taking differently: Place 2 sprays into both nostrils 2 (two) times daily as needed (for seasonal allergies).  02/02/17  Yes Plotnikov, Georgina Quint, MD  Multiple Vitamin (MULTIVITAMIN) capsule Take 1 capsule by mouth daily.    Yes [provider]  ondansetron (ZOFRAN) 4 MG tablet Take 1 tablet (4 mg total) by mouth every 8 (eight) hours as needed for nausea or vomiting. 12/14/18  Yes Plotnikov, Georgina Quint, MD  PARoxetine (PAXIL) 10 MG tablet Take 1 tablet (10 mg total) by mouth daily. 04/26/19  Yes Plotnikov, Georgina Quint, MD  tadalafil (CIALIS) 5 MG  tablet TAKE 1 TABLET BY MOUTH ONCE DAILY Patient taking differently: Take 5 mg by mouth daily.  05/04/18  Yes Plotnikov, Georgina Quint, MD  testosterone cypionate (DEPOTESTOSTERONE CYPIONATE) 200 MG/ML injection INJECT 1 ML (200 MG) INTO THE MUSCLE EVERY 14 DAYS Patient taking differently: Inject 200 mg into the muscle every 14 (fourteen) days.  01/04/19  Yes Plotnikov, Georgina Quint, MD  triamcinolone ointment (KENALOG) 0.1 % Apply 1 application topically 2 (two) times daily. Patient taking differently: Apply 1 application topically 2 (two) times daily as needed (for rashes).  08/01/18  Yes Plotnikov, Georgina Quint, MD  zinc gluconate 50 MG tablet Take 100 mg by mouth daily.   Yes [provider]  azithromycin (ZITHROMAX Z-PAK) 250 MG tablet As directed Patient not taking: Reported on 07/24/2019 04/12/19   Plotnikov, Georgina Quint, MD  benzonatate (TESSALON) 200 MG capsule Take 1 capsule (200 mg total) by mouth 3 (three) times daily as needed for cough. Patient not taking: Reported on 07/24/2019 04/12/19   Plotnikov, Georgina Quint, MD  cholestyramine (QUESTRAN) 4 g packet Take 1 packet (4 g total) by mouth 3 (three)  times daily with meals. 11/15/18   Meryl Dare, MD  dicyclomine (BENTYL) 20 MG tablet Take 1 tablet (20 mg total) by mouth 3 (three) times daily before meals. 10/04/18   Meryl Dare, MD  Eluxadoline (VIBERZI) 100 MG TABS Take 1 tablet (100 mg total) by mouth 2 (two) times daily. Patient not taking: Reported on 07/24/2019 08/24/17   Plotnikov, Georgina Quint, MD  ibuprofen (ADVIL,MOTRIN) 400 MG tablet TAKE 1 TABLET BY MOUTH TWICE A DAY AS NEEDED Patient not taking: Reported on 07/24/2019 11/23/17   Plotnikov, Georgina Quint, MD  loperamide (IMODIUM) 2 MG capsule Take 2 mg by mouth as needed for diarrhea or loose stools.    [provider]  methylPREDNISolone (MEDROL DOSEPAK) 4 MG TBPK tablet As directed 07/04/19   Plotnikov, Georgina Quint, MD  saccharomyces boulardii (FLORASTOR) 250 MG capsule Take 1 capsule (250 mg total) by mouth 2 (two) times daily. Patient not taking: Reported on 07/24/2019 11/24/17   Plotnikov, Georgina Quint, MD  Vitamin D, Ergocalciferol, (DRISDOL) 50000 units CAPS capsule Take 1 capsule (50,000 Units total) by mouth once a week. Patient not taking: Reported on 07/24/2019 10/28/17   Plotnikov, Georgina Quint, MD    Family History Family History  Problem Relation Age of Onset   Hyperlipidemia Other    Coronary artery disease Other    Stroke Mother    Colon cancer Paternal Grandmother    Diabetes Other    Breast cancer Other    Pancreatic cancer Other    Cancer Other        breast, pancreatic   Heart disease Other    Cancer Maternal Grandmother        colon    Social History Social History   Tobacco Use   Smoking status: Never Smoker   Smokeless tobacco: Never Used  Substance Use Topics   Alcohol use: Yes    Comment: 2-6 beers every 2/wks   Drug use: No     Allergies   Contrast media [iodinated diagnostic agents], Buspirone hcl, Divalproex sodium, Olanzapine-fluoxetine hcl, Piroxicam, Wheat bran, and Ibuprofen   Review of Systems Review of  Systems  Constitutional: Negative for fever.  Respiratory: Negative for cough and shortness of breath.   Cardiovascular: Negative for chest pain.  Gastrointestinal: Positive for abdominal pain, constipation and nausea. Negative for vomiting.  Genitourinary: Negative for dysuria and  hematuria.  Neurological: Negative for headaches.  All other systems reviewed and are negative.    Physical Exam Updated Vital Signs BP 120/71 (BP Location: Left Arm)    Pulse 64    Temp 98 F (36.7 C) (Oral)    Resp 16    Ht 5\' 7"  (1.702 m)    Wt 100.1 kg    SpO2 96%    BMI 34.56 kg/m   Physical Exam Vitals signs and nursing note reviewed.  Constitutional:      Appearance: Normal appearance. He is well-developed.     Comments: Appears uncomfortable.  HENT:     Head: Normocephalic and atraumatic.  Eyes:     General: Lids are normal.     Conjunctiva/sclera: Conjunctivae normal.     Pupils: Pupils are equal, round, and reactive to light.  Neck:     Musculoskeletal: Full passive range of motion without pain.  Cardiovascular:     Rate and Rhythm: Normal rate and regular rhythm.     Pulses: Normal pulses.     Heart sounds: Normal heart sounds. No murmur. No friction rub. No gallop.   Pulmonary:     Effort: Pulmonary effort is normal.     Breath sounds: Normal breath sounds.  Abdominal:     General: Bowel sounds are absent.     Palpations: Abdomen is soft. Abdomen is not rigid.     Tenderness: There is abdominal tenderness in the right lower quadrant, periumbilical area and left lower quadrant. There is guarding. Positive signs include McBurney's sign.     Comments: Abdomen is soft, nondistended.  Tenderness noted specifically in the periumbilical and right lower quadrant.  He also has some mild surrounding tenderness to the left lower quadrant area.  Patient with guarding.  No rigidity.  Decreased bowel sounds.  Musculoskeletal: Normal range of motion.  Skin:    General: Skin is warm and dry.      Capillary Refill: Capillary refill takes less than 2 seconds.  Neurological:     Mental Status: He is alert and oriented to person, place, and time.  Psychiatric:        Speech: Speech normal.      ED Treatments / Results  Labs (all labs ordered are listed, but only abnormal results are displayed) Labs Reviewed  COMPREHENSIVE METABOLIC PANEL - Abnormal; Notable for the following components:      Result Value   CO2 21 (*)    Glucose, Bld 146 (*)    Creatinine, Ser 1.51 (*)    GFR calc non Af Amer 52 (*)    All other components within normal limits  CBC - Abnormal; Notable for the following components:   WBC 15.6 (*)    All other components within normal limits  URINALYSIS, ROUTINE W REFLEX MICROSCOPIC - Abnormal; Notable for the following components:   Specific Gravity, Urine 1.043 (*)    Ketones, ur 20 (*)    Protein, ur 30 (*)    All other components within normal limits  BASIC METABOLIC PANEL - Abnormal; Notable for the following components:   Glucose, Bld 106 (*)    Creatinine, Ser 1.45 (*)    Calcium 8.4 (*)    GFR calc non Af Amer 55 (*)    All other components within normal limits  CBC - Abnormal; Notable for the following components:   WBC 12.4 (*)    Hemoglobin 12.8 (*)    MCH 25.9 (*)    All other components within normal  limits  SARS CORONAVIRUS 2 BY RT PCR ([facility]OSPITAL ORDER[facility])  SURGICAL PCR SCREEN  LIPASE, BLOOD  CBC  BASIC METABOLIC PANEL  SURGICAL PATHOLOGY    EKG EKG Interpretation  Date/Time:  Tuesday July 24 2019 19:41:49 EDT Ventricular Rate:  96 PR Interval:  142 QRS Duration: 88 QT Interval:  342 QTC Calculation: 432 R Axis:   2 Text Interpretation: Normal sinus rhythm Nonspecific T wave abnormality Abnormal ECG When compared to prior, no significant changes seen. No STEMI Confirmed by Theda Belfastegeler, Chris ([id number]) on 07/24/2019 7:49:17 PM   Radiology Ct Abdomen Pelvis W Contrast  Result Date:  07/24/2019 CLINICAL DATA:  Right lower quadrant pain for 3 days, constipation EXAM: CT ABDOMEN AND PELVIS WITH CONTRAST TECHNIQUE: Multidetector CT imaging of the abdomen and pelvis was performed using the standard protocol following bolus administration of intravenous contrast. CONTRAST:  100mL OMNIPAQUE IOHEXOL 300 MG/ML  SOLN COMPARISON:  CT abdomen pelvis 03/29/2017 FINDINGS: Lower chest: Basilar atelectatic changes. Lung bases otherwise clear. Normal heart size. No pericardial effusion. Hepatobiliary: No focal liver abnormality is seen. Prominent fold at the neck of the gallbladder. No calcified gallstones, pericholecystic inflammation or biliary ductal dilatation. Pancreas: Partial fatty replacement of the pancreas. No peripancreatic inflammation or ductal dilatation. Spleen: Normal in size without focal abnormality. Adrenals/Urinary Tract: Normal adrenal glands. 11 mm hypoattenuating simple appearing fluid attenuation cyst in the interpolar right kidney. Kidneys are otherwise unremarkable, without renal calculi, suspicious lesion, or hydronephrosis. Bladder is unremarkable. No extravasation of contrast is seen on excretory phase delayed imaging. Stomach/Bowel: Dilated air and fluid-filled appendix with peripheral hyperemia measuring up to 2.3 cm in diameter. Small amount extraluminal gas extending from the base of the appendix (3/70) as well as surrounding fluid and phlegmonous change. No discrete or organized abscess has formed at this time. The air is reactive thickening of the cecal tip as well as the adjacent ileum which courses in close proximity. No resulting obstruction in the inflamed portions of the bowel. Distal esophagus, stomach and more proximal small bowel are unremarkable. More distal colonic segments are free of acute abnormality. Vascular/Lymphatic: The aorta is normal caliber. Reactive adenopathy in the right lower quadrant. No suspicious or enlarged lymph nodes in the included lymphatic  chains. Reproductive: The prostate and seminal vesicles are unremarkable. Other: There is extensive fluid and phlegmonous change centered upon the inflamed appendix in the right lower quadrant. Additional intermediate attenuation (18-25 HU) fluid is seen in the low pelvis and left upper quadrant, likely redistributed. Additional foci of anti dependent gas are noted along the anterior peritoneal surface. Some peritoneal thickening is noted in the right and left lower quadrants compatible with developing peritonitis. Musculoskeletal: No acute osseous abnormality or suspicious osseous lesion. IMPRESSION: 1. Perforated appendicitis with extensive fluid and phlegmonous change centered upon the inflamed appendix in the right lower quadrant. No discrete or organized abscess has formed at this time. Evidence of developing peritonitis. 2. Reactive thickening of the cecal tip as well as the adjacent ileum which courses in close proximity. No resulting obstruction in the inflamed portions of the bowel. Electronically Signed   By: Kreg ShropshirePrice  DeHay M.D.   On: 07/24/2019 19:39   Dg Abd Portable 1 View  Result Date: 07/24/2019 CLINICAL DATA:  Abdomen pain EXAM: PORTABLE ABDOMEN - 1 VIEW COMPARISON:  CT abdomen pelvis March 29, 2017 FINDINGS: The bowel gas pattern is normal. No radio-opaque calculi or other significant radiographic abnormality are seen. IMPRESSION: No bowel obstruction. Electronically  Signed   By: Sherian Rein M.D.   On: 07/24/2019 18:42    Procedures .Critical Care Performed by: Maxwell Caul, PA-C Authorized by: Maxwell Caul, PA-C   Critical care provider statement:    Critical care time (minutes):  45   Critical care was necessary to treat or prevent imminent or life-threatening deterioration of the following conditions: appendicitis with perforation.   Critical care was time spent personally by me on the following activities:  Discussions with consultants, evaluation of patient's response to  treatment, examination of patient, ordering and performing treatments and interventions, ordering and review of laboratory studies, ordering and review of radiographic studies, pulse oximetry, re-evaluation of patient's condition, obtaining history from patient or surrogate and review of old charts   (including critical care time)  Medications Ordered in ED Medications  sodium chloride flush (NS) 0.9 % injection 3 mL ( Intravenous MAR Unhold 07/25/19 1323)  0.9 %  sodium chloride infusion ( Intravenous New Bag/Given 07/25/19 1345)  acetaminophen (TYLENOL) tablet 1,000 mg (1,000 mg Oral Given 07/25/19 1740)  oxyCODONE (Oxy IR/ROXICODONE) immediate release tablet 5-10 mg (10 mg Oral Given 07/25/19 1743)  HYDROmorphone (DILAUDID) injection 1 mg (1 mg Intravenous Given 07/25/19 1620)  ondansetron (ZOFRAN-ODT) disintegrating tablet 4 mg ( Oral See Alternative 07/25/19 1341)    Or  ondansetron (ZOFRAN) injection 4 mg (4 mg Intravenous Given 07/25/19 1341)  prochlorperazine (COMPAZINE) tablet 10 mg ( Oral See Alternative 07/25/19 1523)    Or  prochlorperazine (COMPAZINE) injection 5-10 mg (10 mg Intravenous Given 07/25/19 1523)  metoprolol tartrate (LOPRESSOR) injection 5 mg ( Intravenous MAR Unhold 07/25/19 1323)  piperacillin-tazobactam (ZOSYN) IVPB 3.375 g (3.375 g Intravenous New Bag/Given 07/25/19 1400)  mupirocin ointment (BACTROBAN) 2 % 1 application ( Nasal MAR Unhold 07/25/19 1323)  heparin injection 5,000 Units (5,000 Units Subcutaneous Given 07/25/19 1400)  methocarbamol (ROBAXIN) 500 mg in dextrose 5 % 50 mL IVPB (500 mg Intravenous New Bag/Given 07/25/19 1513)  fentaNYL (SUBLIMAZE) injection 50 mcg (50 mcg Intravenous Given 07/24/19 1606)  ondansetron (ZOFRAN) injection 4 mg (4 mg Intravenous Given 07/24/19 1604)  morphine 4 MG/ML injection 4 mg (4 mg Intravenous Given 07/24/19 1819)  diphenhydrAMINE (BENADRYL) injection 25 mg (25 mg Intravenous Given 07/24/19 1819)  sodium chloride  0.9 % bolus 1,000 mL (0 mLs Intravenous Stopped 07/24/19 2216)  iohexol (OMNIPAQUE) 300 MG/ML solution 100 mL (100 mLs Intravenous Contrast Given 07/24/19 1911)  HYDROmorphone (DILAUDID) injection 1 mg (1 mg Intravenous Given 07/24/19 1942)  piperacillin-tazobactam (ZOSYN) IVPB 3.375 g (0 g Intravenous Stopped 07/24/19 2114)  HYDROmorphone (DILAUDID) injection 1 mg (1 mg Intravenous Given 07/24/19 2122)  ondansetron (ZOFRAN) injection 4 mg (4 mg Intravenous Given 07/24/19 2122)  HYDROmorphone (DILAUDID) injection 1 mg (1 mg Intravenous Given 07/24/19 2237)  fentaNYL (SUBLIMAZE) 250 MCG/5ML injection (has no administration in time range)  midazolam (VERSED) 2 MG/2ML injection (has no administration in time range)  lidocaine 20 MG/ML injection (has no administration in time range)  dexamethasone (DECADRON) 10 MG/ML injection (has no administration in time range)  ondansetron (ZOFRAN) 4 MG/2ML injection (has no administration in time range)  bupivacaine-EPINEPHrine (MARCAINE W/ EPI) 0.5% -1:200000 (with pres) injection (has no administration in time range)  ketamine HCl 50 MG/5ML SOSY (has no administration in time range)  HYDROmorphone (DILAUDID) 1 MG/ML injection (has no administration in time range)     Initial Impression / Assessment and Plan / ED Course  I have reviewed the triage vital signs and the nursing notes.  Pertinent labs & imaging results that were available during my care of the patient were reviewed by me and considered in my medical decision making (see chart for details).        53 year old male who presents for evaluation of 3 days of abdominal pain.  More focused in the right lower quadrant.  Associated with decreased appetite.  Episodes of vomiting today.  Has had constipation for the last 3 days.  No history of abdominal surgeries.  On initial ED arrival, he is afebrile but is tachycardic.  Vitals otherwise stable.  He appears uncomfortable.  On abdominal exam, he has  guarding but no rigidity noted.  He does have some absent bowel sounds.  Concern for appendicitis versus infectious process versus small bowel obstruction versus perforation.  Initial labs ordered at triage.  Will give an emergent x-ray of abdomen to ensure no acute emergent abnormality.  CBC shows leukocytosis of 15.6.  Lipase is unremarkable.  CMP shows bicarb of 21.  BUN is 10, creatinine is 1.51.  X-ray of abdomen shows no evidence of bowel obstruction.  I discussed with patient regarding his contrast media allergy.  He told me that in the 90s, he had a reaction to contrast he felt flushed and he was given Benadryl with improvement.  He states that since then, he has had imaging with contrast without any difficulty.  I extensively reviewed his records.  He had a CT on pelvis with contrast in 2009.  He states he tolerated that without any difficulty.  He is also had CT without contrast in 2018.  I discussed risk versus benefits with patient.  He would like to proceed with contrast study with Benadryl.  CT on pelvis shows perforated appendicitis with extensive fluid and phlegmon exchange.  There is evidence of developing peritonitis.  No discrete abscess.  Will give Zosyn.  Discussed patient with Dr. Dwain Sarna (Gen Surg). He will come see the patient.   Discussed patient with Dr. Daphine Deutscher (Gen Surg) after evaluation. Patient will go to the OR but requests medical admission.   Discussed patient with Dr. Toniann Fail (hospitalist).  He is requesting that surgery be primary mission.  Discussed with Dr. Dwain Sarna.  He agrees for admission.   Portions of this note were generated with Scientist, clinical (histocompatibility and immunogenetics). Dictation errors may occur despite best attempts at proofreading.   Final Clinical Impressions(s) / ED Diagnoses   Final diagnoses:  Appendicitis with perforation    ED Discharge Orders    None       Maxwell Caul, PA-C 07/25/19 1851    Tegeler, Canary Brim, MD 07/25/19  2320

## 2019-07-24 NOTE — Progress Notes (Signed)
Patient ID: Mark Mcgee, male   DOB: 08/15/1966, 53 y.o.   MRN: [id number] Seen by Dr Hassell Done earlier.  I have seen him and reviewed ct scan and labs. Has pain since Friday night worse today. He has been able to urinate now. Not much bowel function, no emesis.  He has rlq pain with guarding on his exam. Wbc up. Vitals are normal when I see him.  I discussed options of going to OR tonight vs attempt to manage this nonoperatively.  I think he likely will end up going to the operating room for this but we discussed attempt to do this which is not unreasonable. I told him he would have to be significantly better tomorrow to continue nonop treatment. He understands this and will reassess in am unless has a change overnight

## 2019-07-24 NOTE — ED Triage Notes (Signed)
Pt reports RLQ abd pain for past 3 days that radiates to his right side. Pt also reports constipation for past 3 days. Distended abd. Pain 10/10. Pt has tried OTC medications with no relief. 44mcg of Fentanyl given by ems. No fevers. Hx of htn.   IV 20g left hand.

## 2019-07-25 ENCOUNTER — Inpatient Hospital Stay (HOSPITAL_COMMUNITY): Payer: 59 | Admitting: Certified Registered"

## 2019-07-25 ENCOUNTER — Encounter (HOSPITAL_COMMUNITY): Admission: EM | Disposition: A | Discharge status: Home / Self Care | Payer: Self-pay | Source: Home / Self Care

## 2019-07-25 ENCOUNTER — Encounter (HOSPITAL_COMMUNITY): Payer: Self-pay | Admitting: Surgery

## 2019-07-25 HISTORY — PX: LAPAROSCOPIC ILEOCECECTOMY: SHX5898

## 2019-07-25 LAB — BASIC METABOLIC PANEL
Anion gap: 9 (ref 5–15)
BUN: 13 mg/dL (ref 6–20)
CO2: 25 mmol/L (ref 22–32)
Calcium: 8.4 mg/dL — ABNORMAL LOW (ref 8.9–10.3)
Chloride: 105 mmol/L (ref 98–111)
Creatinine, Ser: 1.45 mg/dL — ABNORMAL HIGH (ref 0.61–1.24)
GFR calc Af Amer: >60 mL/min (ref >60)
GFR calc non Af Amer: 55 mL/min — ABNORMAL LOW (ref >60)
Glucose, Bld: 106 mg/dL — ABNORMAL HIGH (ref 70–99)
Potassium: 4.3 mmol/L (ref 3.5–5.1)
Sodium: 139 mmol/L (ref 135–145)

## 2019-07-25 LAB — SURGICAL PCR SCREEN
MRSA, PCR: NEGATIVE
Staphylococcus aureus: NEGATIVE

## 2019-07-25 LAB — CBC
HCT: 39.6 % (ref 39.0–52.0)
Hemoglobin: 12.8 g/dL — ABNORMAL LOW (ref 13.0–17.0)
MCH: 25.9 pg — ABNORMAL LOW (ref 26.0–34.0)
MCHC: 32.3 g/dL (ref 30.0–36.0)
MCV: 80 fL (ref 80.0–100.0)
Platelets: 204 10*3/uL (ref 150–400)
RBC: 4.95 MIL/uL (ref 4.22–5.81)
RDW: 13.5 % (ref 11.5–15.5)
WBC: 12.4 10*3/uL — ABNORMAL HIGH (ref 4.0–10.5)
nRBC: 0 % (ref 0.0–0.2)

## 2019-07-25 SURGERY — EXCISION, CECUM WITH ILEUM, LAPAROSCOPIC
Anesthesia: General | Site: Abdomen

## 2019-07-25 MED ORDER — HYDROMORPHONE HCL 1 MG/ML IJ SOLN
1.0000 mg | INTRAMUSCULAR | Status: DC | PRN
  Administered 2019-07-25 (×2): 1 mg via INTRAVENOUS
  Administered 2019-07-25: .5 mg via INTRAVENOUS
  Administered 2019-07-25 – 2019-07-30 (×15): 1 mg via INTRAVENOUS
  Filled 2019-07-25 (×19): qty 1

## 2019-07-25 MED ORDER — ACETAMINOPHEN 10 MG/ML IV SOLN
INTRAVENOUS | Status: AC
  Administered 2019-07-25: 1000 mg via INTRAVENOUS
  Filled 2019-07-25: qty 100

## 2019-07-25 MED ORDER — KETAMINE HCL 50 MG/5ML IJ SOSY
PREFILLED_SYRINGE | INTRAMUSCULAR | Status: AC
  Filled 2019-07-25: qty 5

## 2019-07-25 MED ORDER — SUGAMMADEX SODIUM 200 MG/2ML IV SOLN
INTRAVENOUS | Status: DC | PRN
  Administered 2019-07-25: 200 mg via INTRAVENOUS

## 2019-07-25 MED ORDER — HYDROMORPHONE HCL 1 MG/ML IJ SOLN
INTRAMUSCULAR | Status: AC
  Filled 2019-07-25: qty 0.5

## 2019-07-25 MED ORDER — ACETAMINOPHEN 160 MG/5ML PO SOLN: 1000.0000 mg | Freq: Once | ORAL | Status: DC | PRN

## 2019-07-25 MED ORDER — DEXAMETHASONE SODIUM PHOSPHATE 10 MG/ML IJ SOLN
INTRAMUSCULAR | Status: AC
  Filled 2019-07-25: qty 2

## 2019-07-25 MED ORDER — SODIUM CHLORIDE 0.9 % IR SOLN
Status: DC | PRN
  Administered 2019-07-25: 1

## 2019-07-25 MED ORDER — LACTATED RINGERS IV SOLN
INTRAVENOUS | Status: DC | PRN
  Administered 2019-07-25 (×3): via INTRAVENOUS

## 2019-07-25 MED ORDER — MIDAZOLAM HCL 5 MG/5ML IJ SOLN
INTRAMUSCULAR | Status: DC | PRN
  Administered 2019-07-25: 2 mg via INTRAVENOUS

## 2019-07-25 MED ORDER — PHENYLEPHRINE HCL-NACL 10-0.9 MG/250ML-% IV SOLN
INTRAVENOUS | Status: DC | PRN
  Administered 2019-07-25: 20 ug/min via INTRAVENOUS

## 2019-07-25 MED ORDER — ONDANSETRON HCL 4 MG/2ML IJ SOLN
4.0000 mg | Freq: Four times a day (QID) | INTRAMUSCULAR | Status: DC | PRN
  Administered 2019-07-25 – 2019-07-28 (×6): 4 mg via INTRAVENOUS
  Filled 2019-07-25 (×6): qty 2

## 2019-07-25 MED ORDER — PROCHLORPERAZINE MALEATE 10 MG PO TABS
10.0000 mg | ORAL_TABLET | Freq: Four times a day (QID) | ORAL | Status: DC | PRN
  Filled 2019-07-25: qty 1

## 2019-07-25 MED ORDER — PHENYLEPHRINE 40 MCG/ML (10ML) SYRINGE FOR IV PUSH (FOR BLOOD PRESSURE SUPPORT)
PREFILLED_SYRINGE | INTRAVENOUS | Status: DC | PRN
  Administered 2019-07-25: 80 ug via INTRAVENOUS
  Administered 2019-07-25: 40 ug via INTRAVENOUS
  Administered 2019-07-25: 80 ug via INTRAVENOUS

## 2019-07-25 MED ORDER — ACETAMINOPHEN 500 MG PO TABS
1000.0000 mg | ORAL_TABLET | Freq: Four times a day (QID) | ORAL | Status: DC
  Administered 2019-07-25 (×3): 1000 mg via ORAL
  Administered 2019-07-26: 500 mg via ORAL
  Administered 2019-07-26 – 2019-07-29 (×10): 1000 mg via ORAL
  Filled 2019-07-25 (×17): qty 2

## 2019-07-25 MED ORDER — BUPIVACAINE-EPINEPHRINE 0.5% -1:200000 IJ SOLN
INTRAMUSCULAR | Status: AC
  Filled 2019-07-25: qty 1

## 2019-07-25 MED ORDER — MIDAZOLAM HCL 2 MG/2ML IJ SOLN
INTRAMUSCULAR | Status: AC
  Filled 2019-07-25: qty 2

## 2019-07-25 MED ORDER — ROCURONIUM BROMIDE 10 MG/ML (PF) SYRINGE
PREFILLED_SYRINGE | INTRAVENOUS | Status: DC | PRN
  Administered 2019-07-25: 10 mg via INTRAVENOUS
  Administered 2019-07-25: 40 mg via INTRAVENOUS
  Administered 2019-07-25: 20 mg via INTRAVENOUS
  Administered 2019-07-25 (×3): 10 mg via INTRAVENOUS

## 2019-07-25 MED ORDER — FENTANYL CITRATE (PF) 100 MCG/2ML IJ SOLN
INTRAMUSCULAR | Status: AC
  Administered 2019-07-25: 50 ug via INTRAVENOUS
  Filled 2019-07-25: qty 2

## 2019-07-25 MED ORDER — MUPIROCIN 2 % EX OINT: 1.0000 "application " | TOPICAL_OINTMENT | Freq: Two times a day (BID) | CUTANEOUS | Status: DC

## 2019-07-25 MED ORDER — EPHEDRINE SULFATE-NACL 50-0.9 MG/10ML-% IV SOSY
PREFILLED_SYRINGE | INTRAVENOUS | Status: DC | PRN
  Administered 2019-07-25 (×2): 5 mg via INTRAVENOUS

## 2019-07-25 MED ORDER — ACETAMINOPHEN 10 MG/ML IV SOLN
1000.0000 mg | Freq: Once | INTRAVENOUS | Status: DC | PRN
  Administered 2019-07-25: 12:00:00 1000 mg via INTRAVENOUS

## 2019-07-25 MED ORDER — OXYCODONE HCL 5 MG/5ML PO SOLN: 5.0000 mg | Freq: Once | ORAL | Status: DC | PRN

## 2019-07-25 MED ORDER — PIPERACILLIN-TAZOBACTAM 3.375 G IVPB
3.3750 g | Freq: Three times a day (TID) | INTRAVENOUS | Status: DC
  Administered 2019-07-25 – 2019-08-01 (×23): 3.375 g via INTRAVENOUS
  Filled 2019-07-25 (×23): qty 50

## 2019-07-25 MED ORDER — LIDOCAINE 2% (20 MG/ML) 5 ML SYRINGE
INTRAMUSCULAR | Status: DC | PRN
  Administered 2019-07-25: 60 mg via INTRAVENOUS

## 2019-07-25 MED ORDER — FENTANYL CITRATE (PF) 250 MCG/5ML IJ SOLN
INTRAMUSCULAR | Status: AC
  Filled 2019-07-25: qty 5

## 2019-07-25 MED ORDER — 0.9 % SODIUM CHLORIDE (POUR BTL) OPTIME
TOPICAL | Status: DC | PRN
  Administered 2019-07-25 (×2): 1000 mL

## 2019-07-25 MED ORDER — KETAMINE HCL 50 MG/ML IJ SOLN
INTRAMUSCULAR | Status: DC | PRN
  Administered 2019-07-25: 10 mg via INTRAMUSCULAR
  Administered 2019-07-25: 40 mg via INTRAMUSCULAR

## 2019-07-25 MED ORDER — SUCCINYLCHOLINE CHLORIDE 20 MG/ML IJ SOLN
INTRAMUSCULAR | Status: DC | PRN
  Administered 2019-07-25: 120 mg via INTRAVENOUS

## 2019-07-25 MED ORDER — PROCHLORPERAZINE EDISYLATE 10 MG/2ML IJ SOLN
5.0000 mg | Freq: Four times a day (QID) | INTRAMUSCULAR | Status: DC | PRN
  Administered 2019-07-25: 10 mg via INTRAVENOUS
  Administered 2019-07-26: 5 mg via INTRAVENOUS
  Administered 2019-07-31: 10 mg via INTRAVENOUS
  Filled 2019-07-25 (×3): qty 2

## 2019-07-25 MED ORDER — ONDANSETRON HCL 4 MG/2ML IJ SOLN
INTRAMUSCULAR | Status: DC | PRN
  Administered 2019-07-25 (×2): 4 mg via INTRAVENOUS

## 2019-07-25 MED ORDER — LIDOCAINE 2% (20 MG/ML) 5 ML SYRINGE
INTRAMUSCULAR | Status: AC
  Filled 2019-07-25: qty 10

## 2019-07-25 MED ORDER — PROPOFOL 10 MG/ML IV BOLUS
INTRAVENOUS | Status: DC | PRN
  Administered 2019-07-25: 180 mg via INTRAVENOUS

## 2019-07-25 MED ORDER — SODIUM CHLORIDE 0.9 % IV SOLN
INTRAVENOUS | Status: DC
  Administered 2019-07-25 – 2019-07-30 (×10): via INTRAVENOUS

## 2019-07-25 MED ORDER — HEPARIN SODIUM (PORCINE) 5000 UNIT/ML IJ SOLN
5000.0000 [IU] | Freq: Three times a day (TID) | INTRAMUSCULAR | Status: DC
  Administered 2019-07-25 – 2019-07-30 (×13): 5000 [IU] via SUBCUTANEOUS
  Filled 2019-07-25 (×14): qty 1

## 2019-07-25 MED ORDER — ENOXAPARIN SODIUM 40 MG/0.4ML ~~LOC~~ SOLN: 40.0000 mg | Freq: Every day | SUBCUTANEOUS | Status: DC

## 2019-07-25 MED ORDER — ACETAMINOPHEN 500 MG PO TABS: 1000.0000 mg | ORAL_TABLET | Freq: Once | ORAL | Status: DC | PRN

## 2019-07-25 MED ORDER — CHLORHEXIDINE GLUCONATE CLOTH 2 % EX PADS
6.0000 | MEDICATED_PAD | Freq: Every day | CUTANEOUS | Status: DC
  Administered 2019-07-25 – 2019-08-01 (×6): 6 via TOPICAL

## 2019-07-25 MED ORDER — FENTANYL CITRATE (PF) 100 MCG/2ML IJ SOLN
25.0000 ug | INTRAMUSCULAR | Status: DC | PRN
  Administered 2019-07-25 (×3): 50 ug via INTRAVENOUS

## 2019-07-25 MED ORDER — OXYCODONE HCL 5 MG PO TABS
5.0000 mg | ORAL_TABLET | ORAL | Status: DC | PRN
  Administered 2019-07-25 – 2019-07-29 (×12): 10 mg via ORAL
  Filled 2019-07-25 (×13): qty 2

## 2019-07-25 MED ORDER — FENTANYL CITRATE (PF) 250 MCG/5ML IJ SOLN
INTRAMUSCULAR | Status: DC | PRN
  Administered 2019-07-25 (×5): 50 ug via INTRAVENOUS

## 2019-07-25 MED ORDER — OXYCODONE HCL 5 MG PO TABS: 5.0000 mg | ORAL_TABLET | Freq: Once | ORAL | Status: DC | PRN

## 2019-07-25 MED ORDER — ONDANSETRON HCL 4 MG/2ML IJ SOLN
INTRAMUSCULAR | Status: AC
  Filled 2019-07-25: qty 4

## 2019-07-25 MED ORDER — DEXAMETHASONE SODIUM PHOSPHATE 10 MG/ML IJ SOLN
INTRAMUSCULAR | Status: DC | PRN
  Administered 2019-07-25: 5 mg via INTRAVENOUS

## 2019-07-25 MED ORDER — METOPROLOL TARTRATE 5 MG/5ML IV SOLN: 5.0000 mg | Freq: Four times a day (QID) | INTRAVENOUS | Status: DC | PRN

## 2019-07-25 MED ORDER — METHOCARBAMOL 1000 MG/10ML IJ SOLN
500.0000 mg | Freq: Four times a day (QID) | INTRAVENOUS | Status: DC | PRN
  Administered 2019-07-25 – 2019-07-28 (×5): 500 mg via INTRAVENOUS
  Filled 2019-07-25 (×3): qty 5
  Filled 2019-07-25: qty 500
  Filled 2019-07-25 (×3): qty 5
  Filled 2019-07-25 (×2): qty 500

## 2019-07-25 MED ORDER — BUPIVACAINE-EPINEPHRINE 0.5% -1:200000 IJ SOLN
INTRAMUSCULAR | Status: DC | PRN
  Administered 2019-07-25: 10 mL

## 2019-07-25 MED ORDER — ONDANSETRON 4 MG PO TBDP: 4.0000 mg | ORAL_TABLET | Freq: Four times a day (QID) | ORAL | Status: DC | PRN

## 2019-07-25 SURGICAL SUPPLY — 75 items
ADH SKN CLS APL DERMABOND .7 (GAUZE/BANDAGES/DRESSINGS) ×2
APL PRP STRL LF DISP 70% ISPRP (MISCELLANEOUS) ×2
APPLIER CLIP 5 13 M/L LIGAMAX5 (MISCELLANEOUS)
APR CLP MED LRG 5 ANG JAW (MISCELLANEOUS)
BAG SPEC RTRVL LRG 6X4 10 (ENDOMECHANICALS) ×2
BLADE CLIPPER SURG (BLADE) ×2 IMPLANT
BNDG GAUZE ELAST 4 BULKY (GAUZE/BANDAGES/DRESSINGS) ×3 IMPLANT
CANISTER SUCT 3000ML PPV (MISCELLANEOUS) ×3 IMPLANT
CHLORAPREP W/TINT 26 (MISCELLANEOUS) ×3 IMPLANT
CLIP APPLIE 5 13 M/L LIGAMAX5 (MISCELLANEOUS) IMPLANT
COVER SURGICAL LIGHT HANDLE (MISCELLANEOUS) ×3 IMPLANT
COVER WAND RF STERILE (DRAPES) ×3 IMPLANT
CUTTER FLEX LINEAR 45M (STAPLE) ×3 IMPLANT
DERMABOND ADVANCED (GAUZE/BANDAGES/DRESSINGS) ×1
DERMABOND ADVANCED .7 DNX12 (GAUZE/BANDAGES/DRESSINGS) ×2 IMPLANT
DRAIN CHANNEL 19F RND (DRAIN) ×3 IMPLANT
DRAPE UTILITY 15X26 TOWEL STRL (DRAPES) ×4 IMPLANT
DRSG PAD ABDOMINAL 8X10 ST (GAUZE/BANDAGES/DRESSINGS) ×3 IMPLANT
ELECT BLADE 4.0 EZ CLEAN MEGAD (MISCELLANEOUS) ×3
ELECT CAUTERY BLADE 6.4 (BLADE) ×3 IMPLANT
ELECT REM PT RETURN 9FT ADLT (ELECTROSURGICAL) ×3
ELECTRODE BLDE 4.0 EZ CLN MEGD (MISCELLANEOUS) ×2 IMPLANT
ELECTRODE REM PT RTRN 9FT ADLT (ELECTROSURGICAL) ×2 IMPLANT
EVACUATOR SILICONE 100CC (DRAIN) ×2 IMPLANT
GAUZE SPONGE 4X4 12PLY STRL (GAUZE/BANDAGES/DRESSINGS) ×2 IMPLANT
GLOVE BIO SURGEON STRL SZ7.5 (GLOVE) ×3 IMPLANT
GLOVE BIOGEL PI IND STRL 7.5 (GLOVE) ×4 IMPLANT
GLOVE BIOGEL PI INDICATOR 7.5 (GLOVE) ×4
GLOVE INDICATOR 8.0 STRL GRN (GLOVE) ×3 IMPLANT
GLOVE SS BIOGEL STRL SZ 7.5 (GLOVE) ×4 IMPLANT
GLOVE SUPERSENSE BIOGEL SZ 7.5 (GLOVE) ×4
GOWN STRL REUS W/ TWL LRG LVL3 (GOWN DISPOSABLE) ×4 IMPLANT
GOWN STRL REUS W/ TWL XL LVL3 (GOWN DISPOSABLE) ×2 IMPLANT
GOWN STRL REUS W/TWL LRG LVL3 (GOWN DISPOSABLE) ×6
GOWN STRL REUS W/TWL XL LVL3 (GOWN DISPOSABLE) ×3
KIT BASIN OR (CUSTOM PROCEDURE TRAY) ×3 IMPLANT
KIT TURNOVER KIT B (KITS) ×3 IMPLANT
LIGASURE IMPACT 36 18CM CVD LR (INSTRUMENTS) ×3 IMPLANT
NS IRRIG 1000ML POUR BTL (IV SOLUTION) ×3 IMPLANT
PAD ARMBOARD 7.5X6 YLW CONV (MISCELLANEOUS) ×6 IMPLANT
PENCIL SMOKE EVACUATOR (MISCELLANEOUS) ×3 IMPLANT
PORT LAP GEL ALEXIS MED 5-9CM (MISCELLANEOUS) ×6 IMPLANT
POUCH SPECIMEN RETRIEVAL 10MM (ENDOMECHANICALS) ×3 IMPLANT
RELOAD 45 VASCULAR/THIN (ENDOMECHANICALS) IMPLANT
RELOAD PROXIMATE 75MM BLUE (ENDOMECHANICALS) ×9 IMPLANT
RELOAD STAPLE TA45 3.5 REG BLU (ENDOMECHANICALS) IMPLANT
SCISSORS LAP 5X35 DISP (ENDOMECHANICALS) IMPLANT
SET IRRIG TUBING LAPAROSCOPIC (IRRIGATION / IRRIGATOR) ×3 IMPLANT
SET TUBE SMOKE EVAC HIGH FLOW (TUBING) ×3 IMPLANT
SHEARS HARMONIC ACE PLUS 36CM (ENDOMECHANICALS) ×3 IMPLANT
SLEEVE ENDOPATH XCEL 5M (ENDOMECHANICALS) ×3 IMPLANT
SPECIMEN JAR SMALL (MISCELLANEOUS) ×3 IMPLANT
STAPLER 90 3.5 STAND SLIM (STAPLE) ×3
STAPLER 90 3.5 STD SLIM (STAPLE) ×2 IMPLANT
STAPLER GUN LINEAR PROX 60 (STAPLE) ×3 IMPLANT
STAPLER PROXIMATE 75MM BLUE (STAPLE) ×6 IMPLANT
SUCTION POOLE TIP (SUCTIONS) ×4 IMPLANT
SUT ETHILON 2 0 FS 18 (SUTURE) ×3 IMPLANT
SUT MNCRL AB 4-0 PS2 18 (SUTURE) ×5 IMPLANT
SUT PDS AB 1 CT  36 (SUTURE) ×1
SUT PDS AB 1 CT 36 (SUTURE) ×2 IMPLANT
SUT PDS AB 1 CT1 36 (SUTURE) ×3 IMPLANT
SUT SILK 2 0 SH CR/8 (SUTURE) ×2 IMPLANT
SUT SILK 3 0 SH CR/8 (SUTURE) ×2 IMPLANT
SYR CONTROL 10ML LL (SYRINGE) ×3 IMPLANT
TAPE CLOTH SURG 6X10 WHT LF (GAUZE/BANDAGES/DRESSINGS) ×3 IMPLANT
TOWEL GREEN STERILE (TOWEL DISPOSABLE) ×3 IMPLANT
TOWEL GREEN STERILE FF (TOWEL DISPOSABLE) ×3 IMPLANT
TRAY FOLEY W/BAG SLVR 16FR (SET/KITS/TRAYS/PACK) ×3
TRAY FOLEY W/BAG SLVR 16FR ST (SET/KITS/TRAYS/PACK) ×2 IMPLANT
TRAY LAPAROSCOPIC MC (CUSTOM PROCEDURE TRAY) ×3 IMPLANT
TROCAR XCEL BLUNT TIP 100MML (ENDOMECHANICALS) ×3 IMPLANT
TROCAR XCEL NON-BLD 5MMX100MML (ENDOMECHANICALS) ×3 IMPLANT
WATER STERILE IRR 1000ML POUR (IV SOLUTION) ×3 IMPLANT
YANKAUER SUCT BULB TIP NO VENT (SUCTIONS) ×6 IMPLANT

## 2019-07-25 NOTE — Op Note (Signed)
Mark Mcgee [id number]   PRE-OPERATIVE DIAGNOSIS:  Perforated appendicitis  POST-OPERATIVE DIAGNOSIS:  Perforated gangrenous appendicitis  Procedure(s): APPENDECTOMY LAPAROSCOPIC,  PROCEDURE: Laparoscopic assisted ileocecectomy  SURGEON:  Sharon Mt. Aliyah Abeyta, M.D.  ASSISTANT: Sharyn Dross RNFA  ANESTHESIA: General endotracheal  EBL:   100 mL  DRAINS: 19Fr round blake left draining pelvis and RLQ  SPECIMEN:  Terminal ileum, cecum/appendix all en bloc  COUNTS:  Sponge, needle and instrument counts were reported correct x2 at conclusion of the operation  DISPOSITION:  PACU in satisfactory condition  COMPLICATIONS: None  FINDINGS: Acutely perforated gangrenous appendicitis - perforation at base with significant phlegmon at the base involving the wall of the cecum such that no stapler could safely be applied.  There was copious amounts of pus in the right lower quadrant as well as in his pelvis.  There were free-floating fecaliths in the abdomen. Peritoneum was hyperemic in appearance consistent with peritonitis. The terminal ileum and cecum as well as ascending colon were mobilized.  Given the location of the perforation and no real healthy base of appendix and perforation at this location, the decision was made to proceed with an ileocecectomy.  A 19 French round Blake drain was placed draining the pelvis and right lower quadrant.  INDICATIONS: Mark Mcgee is a very pleasant 42yoM whom presented to the hospital with abdominal pain that began on Friday.  He was worked up in the emergency room last night and evaluated.  He was found to have findings on his CAT scan of perforated appendicitis with a significant phlegmon as well as air outside the lumen of the appendix. He was admitted and started on antibiotics.  I met him this morning.  He had diffuse abdominal tenderness with guarding.  He had signs clinically of peritonitis.  Given all these findings, we discussed options moving  forward and surgery.  We discussed laparoscopic and potential open techniques as well as possible ileocecectomy.  He opted to proceed.  Please refer to notes elsewhere for details regarding this discussion.  DESCRIPTION:   The patient was identified & brought into the operating room. SCDs were in place and functioning. General endotracheal anesthesia was administered. Preoperative antibiotics were administered. The patient was positioned supine with arms tucked. Hair on the abdomen was then clipped by the OR team. A foley catheter was inserted under sterile conditions. The abdomen was prepped and draped in the standard sterile fashion. A surgical timeout confirmed our plan.  A small infraumbilical incision was made. The subcutaneous tissue was dissected and the umbilical stalk identified. The stalk was grasped with a Kocher and retracted outwardly. The infraumbilical fascia was exposed and incised. Peritoneal entry was carefully made bluntly. A 0 Vicryl purse-string suture was placed and then the [place] port was introduced into the abdomen.  CO2 insufflation commenced to 12mHg. The laparoscope was inserted and confirmed no evidence of trocar site complications. The patient was then positioned in Trendelenburg. Two additional ports were placed - one in left lower quadrant and another in the suprapubic midline taking care to stay well above the bladder - 3 fingerbreadths above the pubic symphysis. The bed was then slightly tilted to place the left side down.  The terminal ileum was identified as was a large phlegmon in his right lower quadrant.  There was pus exuding from this area.  There is also significant amount of pus in his pelvis.  The peritoneum was diffusely inflamed in appearance and hyperemic consistent with peritonitis.  The appendix is ultimately able  to be identified in the right lower quadrant extending into the pelvis.  This was gangrenous in appearance.  A perforation is identified at the  base of the appendix with stool exuding from the appendiceal base.  The wall the cecum at this location was thickened and inflamed.  There is no good target for application of the stapler.  With these findings, the decision was made to proceed with an ileocecectomy.  The appendix was carefully bluntly mobilized.  The terminal ileum was gently mobilized sharply up to the cecum in a lateral to medial fashion. Care was taken to avoid injuring any retroperitoneal structures.  The appendix was elevated.  The ascending colon was then mobilized medially by taking down the Anavictoria Wilk line of Toldt.  The ascending mesocolon was mobilized.  At this point, attention was turned to extraction.  Pneumoperitoneum was released.  The infraumbilical incision was extended around the umbilicus.  The fascia was incised.  A wound protector was placed.  The cecum, terminal ileum, and appendix were delivered.  The terminal ileum did have a phlegmon on top of it so the proximal point of transection was selected just proximal to this.  A window was created in the mesentery.  Terminal ileum was divided using a linear cutting GIA blue load stapler.  The distal point of transection was identified on the proximal ascending colon.  A window was created in the mesentery at this level and the colon divided with a linear cutting GIA stapler blue load.  The intervening mesentery was then ligated using the LigaSure and taking care to "hug" the terminal ileum and colon so as not to devascularize the ascending colon.  Specimen was then passed off.  Orientation was maintained on the terminal ileum during this process.  The ileum and ascending colon were then aligned and an anastomosis created using a single firing of the GIA blue load 75 mm stapler.  The anastomosis was inspected and noted to be hemostatic.  The common enterotomy was then closed with a firing of the [place] 90 stapler.  The corners of the staple line were then "dunked" using 2-0 silk suture.   An apex suture was placed at the anastomosis using 3-0 silk.  The anastomosis was palpated and noted to be widely patent. This was then placed back into the abdomen and omentum covered.  The wound protector Was placed.  Pneumoperitoneum was reestablished.  The laparoscope was inserted.  The right lower quadrant and pelvis were irrigated with sterile saline until the effluent ran clear.  Hemostasis was verified.  The 5 mm ports were removed under direct visualization and noted to be hemostatic.  A 19 French round Blake drain was placed through the left lower quadrant port site.  This was left draining the pelvis and right lower quadrant.  We then removed all equipment and exchanged for clean equipment/gowns/gloves.  Additional sterile drapes were placed around the operative field.  Sponge, needle, and instrument counts reported correct x2.  The midline fascia was then approximated using 2 running #1 PDS sutures.  Given the findings, the skin was intentionally left open.  A moist Kerlix was placed covered by 4 x 4's and an ABD pad.  The drain was secured with a 2-0 nylon.  The suprapubic port site was closed using a 4-0 Monocryl suture.  He was then awakened from anesthesia, extubated, and transferred to a stretcher for transport to PACU in satisfactory condition.

## 2019-07-25 NOTE — Progress Notes (Signed)
Patient ID: Mark Mcgee, male   DOB: 1966/08/26, 53 y.o.   MRN: [id number] Wbc a little better, febrile, exam still tender some on llq now also. I think he is certainly no better. I told him I think he needs to go to OR this am for attempt at lap appy possibly open

## 2019-07-25 NOTE — Anesthesia Procedure Notes (Signed)
Procedure Name: Intubation Date/Time: 07/25/2019 9:02 AM Performed by: Gaylene Brooks, CRNA Pre-anesthesia Checklist: Patient identified, Emergency Drugs available, Suction available and Patient being monitored Patient Re-evaluated:Patient Re-evaluated prior to induction Oxygen Delivery Method: Circle System Utilized Preoxygenation: Pre-oxygenation with 100% oxygen Induction Type: IV induction, Rapid sequence and Cricoid Pressure applied Laryngoscope Size: Miller and 2 Grade View: Grade II Tube type: Oral Tube size: 7.5 mm Number of attempts: 1 Airway Equipment and Method: Stylet and Oral airway Placement Confirmation: ETT inserted through vocal cords under direct vision,  positive ETCO2 and breath sounds checked- equal and bilateral Secured at: 22 cm Tube secured with: Tape Dental Injury: Teeth and Oropharynx as per pre-operative assessment

## 2019-07-25 NOTE — Transfer of Care (Signed)
Immediate Anesthesia Transfer of Care Note  Patient: Mark Mcgee  Procedure(s) Performed: Laparoscopic assisted  Ileocecectomy (Abdomen)  Patient Location: PACU  Anesthesia Type:General  Level of Consciousness: awake, alert  and oriented  Airway & Oxygen Therapy: Patient Spontanous Breathing and Patient connected to nasal cannula oxygen  Post-op Assessment: Report given to RN, Post -op Vital signs reviewed and stable and Patient moving all extremities X 4  Post vital signs: Reviewed and stable  Last Vitals:  Vitals Value Taken Time  BP 136/90 07/25/19 1200  Temp    Pulse 89 07/25/19 1202  Resp 20 07/25/19 1202  SpO2 97 % 07/25/19 1202  Vitals shown include unvalidated device data.  Last Pain:  Vitals:   07/25/19 0800  TempSrc:   PainSc: 7          Complications: No apparent anesthesia complications

## 2019-07-25 NOTE — Progress Notes (Signed)
Subjective Complains of diffuse abdominal pain and spasms across abdominal wall.   Objective: Vital signs in last 24 hours: Temp:  [99.3 F (37.4 C)-101.1 F (38.4 C)] 99.3 F (37.4 C) (10/28 0500) Pulse Rate:  [90-108] 90 (10/28 0500) Resp:  [16-24] 18 (10/28 0500) BP: (102-138)/(68-96) 102/71 (10/28 0500) SpO2:  [92 %-96 %] 93 % (10/28 0500) Weight:  [91.2 kg-100.1 kg] 100.1 kg (10/28 0112) Last BM Date: 07/20/19  Intake/Output from previous day: 10/27 0701 - 10/28 0700 In: 1288.5 [I.V.:238.5; IV Piggyback:1050] Out: -  Intake/Output this shift: No intake/output data recorded.  Gen: Uncomfortable, gripping right lower quadrant CV: RRR Pulm: Normal work of breathing Abd: Soft, diffusely tender to palpation throughout with guarding Ext: SCDs in place  Lab Results: CBC  Recent Labs    07/24/19 1055 07/25/19 0513  WBC 15.6* 12.4*  HGB 14.6 12.8*  HCT 45.2 39.6  PLT 262 204   BMET Recent Labs    07/24/19 1055 07/25/19 0513  NA 139 139  K 3.9 4.3  CL 103 105  CO2 21* 25  GLUCOSE 146* 106*  BUN 10 13  CREATININE 1.51* 1.45*  CALCIUM 9.2 8.4*   PT/INR No results for input(s): LABPROT, INR in the last 72 hours. ABG No results for input(s): PHART, HCO3 in the last 72 hours.  Invalid input(s): PCO2, PO2  Studies/Results:  Anti-infectives: Anti-infectives (From admission, onward)   Start     Dose/Rate Route Frequency Ordered Stop   07/25/19 0400  piperacillin-tazobactam (ZOSYN) IVPB 3.375 g     3.375 g 12.5 mL/hr over 240 Minutes Intravenous Every 8 hours 07/25/19 0118     07/24/19 2000  piperacillin-tazobactam (ZOSYN) IVPB 3.375 g     3.375 g 100 mL/hr over 30 Minutes Intravenous  Once 07/24/19 1954 07/24/19 2114       Assessment/Plan: Patient Active Problem List   Diagnosis Date Noted  . Perforated appendicitis 07/24/2019  . Stress and adjustment reaction 03/01/2019  . Febrile illness 02/15/2019  . 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  Therapeutic drug monitoring 08/24/2017  . Right arm pain 06/15/2017  . Exposure to blood or body fluid 05/18/2017  . Arthralgia 07/02/2016  . IBS (irritable bowel syndrome) 03/02/2015  . Well adult exam 08/07/2014  . Chest pain, atypical 08/31/2012  . URI (upper respiratory infection) 05/22/2012  . Serologic abnormality 11/15/2011  . BPH (benign prostatic hyperplasia) 08/23/2011  . 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 LUMP OR MASS IN BREAST 11/04/2010  . RENAL COLIC 81/19/1478  . Prostatitis 11/10/2009  . ANEMIA-UNSPECIFIED 03/10/2009  . ABDOMINAL PAIN, LEFT LOWER QUADRANT 03/10/2009  . PANIC DISORDER 02/11/2009  . Incontinence of feces 02/11/2009  . OTHER GENITAL HERPES 08/26/2008  . OTHER SPECIFIED DISORDER OF SKIN 08/26/2008  . DYSURIA 08/16/2008  . LOW BACK PAIN 08/05/2008  . Asthmatic bronchitis 06/28/2008  . Chronic fatigue 04/16/2008  . FLATULENCE 01/09/2008  . INSOMNIA, PERSISTENT 10/10/2007  . CHEST WALL PAIN 10/10/2007  . Diarrhea 10/10/2007  . Anxiety disorder 08/29/2007  . HYPERTENSION 08/29/2007  . Adjustment disorder with mixed anxiety and depressed mood 06/27/2007   Mr. Mckiver is a very pleasant 5yoM with perforated appendicitis and peritonitis  -The anatomy and physiology of the GI tract was discussed at length with the patient. The pathophysiology of appendicitis was discussed at length as well. -We discussed options moving forward for treatment including observation with IV abx vs surgery - he has not improved and has  peritonitis. We discussed surgical procedure including laparoscopic and potential open techniques as well as potential for ileocecectomy. We discussed the material risks (including, but not limited to, pain, bleeding, infection including significant wound infections, scarring, need for blood transfusion, damage to surrounding structures- blood vessels/nerves/viscus/organs, damage to  ureter/bladder, urine leak, leak from staple line, need for additional procedures, hernia, recurrence although quite low, pneumonia, heart attack, stroke, death) benefits and alternatives to surgery were discussed at length. The patient's questions were answered to his satisfaction, he voiced understanding and elected to proceed with surgery. Additionally, we discussed typical postoperative expectations and the recovery process.   LOS: 1 day   Stephanie Coup. Cliffton Asters, M.D. [facility] Use [email or web address] to contact on call provider

## 2019-07-25 NOTE — Anesthesia Preprocedure Evaluation (Signed)
Anesthesia Evaluation  Patient identified by MRN, date of birth, ID band Patient awake    Reviewed: Allergy & Precautions, NPO status , Patient's Chart, lab work & pertinent test results  History of Anesthesia Complications Negative for: history of anesthetic complications  Airway Mallampati: III  TM Distance: >3 FB Neck ROM: Full    Dental  (+) Dental Advisory Given, Teeth Intact   Pulmonary asthma ,    breath sounds clear to auscultation       Cardiovascular hypertension,  Rhythm:Regular     Neuro/Psych PSYCHIATRIC DISORDERS Anxiety Depression negative neurological ROS     GI/Hepatic GERD  ,IBS   Endo/Other    Renal/GU Renal disease     Musculoskeletal negative musculoskeletal ROS (+)   Abdominal   Peds  Hematology negative hematology ROS (+)   Anesthesia Other Findings   Reproductive/Obstetrics                             Anesthesia Physical Anesthesia Plan  ASA: II  Anesthesia Plan: General   Post-op Pain Management:    Induction: Intravenous, Rapid sequence and Cricoid pressure planned  PONV Risk Score and Plan: 2 and Ondansetron and Dexamethasone  Airway Management Planned: Oral ETT  Additional Equipment: None  Intra-op Plan:   Post-operative Plan: Extubation in OR  Informed Consent: I have reviewed the patients History and Physical, chart, labs and discussed the procedure including the risks, benefits and alternatives for the proposed anesthesia with the patient or authorized representative who has indicated his/her understanding and acceptance.     Dental advisory given  Plan Discussed with: CRNA and Surgeon  Anesthesia Plan Comments:         Anesthesia Quick Evaluation

## 2019-07-26 ENCOUNTER — Encounter (HOSPITAL_COMMUNITY): Payer: Self-pay | Admitting: Surgery

## 2019-07-26 LAB — CBC
HCT: 36.2 % — ABNORMAL LOW (ref 39.0–52.0)
Hemoglobin: 11.5 g/dL — ABNORMAL LOW (ref 13.0–17.0)
MCH: 25.8 pg — ABNORMAL LOW (ref 26.0–34.0)
MCHC: 31.8 g/dL (ref 30.0–36.0)
MCV: 81.3 fL (ref 80.0–100.0)
Platelets: 207 10*3/uL (ref 150–400)
RBC: 4.45 MIL/uL (ref 4.22–5.81)
RDW: 13.5 % (ref 11.5–15.5)
WBC: 11.9 10*3/uL — ABNORMAL HIGH (ref 4.0–10.5)
nRBC: 0 % (ref 0.0–0.2)

## 2019-07-26 LAB — BASIC METABOLIC PANEL
Anion gap: 8 (ref 5–15)
BUN: 11 mg/dL (ref 6–20)
CO2: 25 mmol/L (ref 22–32)
Calcium: 8 mg/dL — ABNORMAL LOW (ref 8.9–10.3)
Chloride: 104 mmol/L (ref 98–111)
Creatinine, Ser: 1.28 mg/dL — ABNORMAL HIGH (ref 0.61–1.24)
GFR calc Af Amer: >60 mL/min (ref >60)
GFR calc non Af Amer: >60 mL/min (ref >60)
Glucose, Bld: 117 mg/dL — ABNORMAL HIGH (ref 70–99)
Potassium: 4.4 mmol/L (ref 3.5–5.1)
Sodium: 137 mmol/L (ref 135–145)

## 2019-07-26 LAB — SURGICAL PATHOLOGY

## 2019-07-26 MED ORDER — SODIUM CHLORIDE 0.9 % IV SOLN
12.5000 mg | Freq: Four times a day (QID) | INTRAVENOUS | Status: DC | PRN
  Administered 2019-07-26: 12.5 mg via INTRAVENOUS
  Filled 2019-07-26: qty 0.5

## 2019-07-26 NOTE — Progress Notes (Addendum)
Subjective No acute events. Hiccups and some nausea, no emesis. Abdominal bloating. RLQ pain better since surgery.  Objective: Vital signs in last 24 hours: Temp:  [97.2 F (36.2 C)-98.9 F (37.2 C)] 98.2 F (36.8 C) (10/29 0610) Pulse Rate:  [64-89] 81 (10/29 0610) Resp:  [7-16] 16 (10/29 0610) BP: (111-136)/(71-90) 111/76 (10/29 0610) SpO2:  [90 %-98 %] 90 % (10/29 0610) Last BM Date: 07/20/19  Intake/Output from previous day: 10/28 0701 - 10/29 0700 In: 2956.2 [I.V.:2807.7; IV Piggyback:148.4] Out: 1165 [Urine:960; Drains:105; Blood:100] Intake/Output this shift: No intake/output data recorded.  Gen: NAD, comfortable CV: RRR Pulm: Normal work of breathing Abd: Soft, ttp around midline; no rebound/guarding; moderately distended; incision dressed/dry; no erythema around wound, base clean; JP ss Ext: SCDs in place  Lab Results: CBC  Recent Labs    07/25/19 0513 07/26/19 0528  WBC 12.4* 11.9*  HGB 12.8* 11.5*  HCT 39.6 36.2*  PLT 204 207   BMET Recent Labs    07/25/19 0513 07/26/19 0528  NA 139 137  K 4.3 4.4  CL 105 104  CO2 25 25  GLUCOSE 106* 117*  BUN 13 11  CREATININE 1.45* 1.28*  CALCIUM 8.4* 8.0*   PT/INR No results for input(s): LABPROT, INR in the last 72 hours. ABG No results for input(s): PHART, HCO3 in the last 72 hours.  Invalid input(s): PCO2, PO2  Studies/Results:  Anti-infectives: Anti-infectives (From admission, onward)   Start     Dose/Rate Route Frequency Ordered Stop   07/25/19 0400  piperacillin-tazobactam (ZOSYN) IVPB 3.375 g     3.375 g 12.5 mL/hr over 240 Minutes Intravenous Every 8 hours 07/25/19 0118     07/24/19 2000  piperacillin-tazobactam (ZOSYN) IVPB 3.375 g     3.375 g 100 mL/hr over 30 Minutes Intravenous  Once 07/24/19 1954 07/24/19 2114       Assessment/Plan: Patient Active Problem List   Diagnosis Date Noted  . Perforated appendicitis 07/24/2019  . Stress and adjustment reaction 03/01/2019  . Febrile  illness 02/15/2019  . 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 Therapeutic drug monitoring 08/24/2017  . Right arm pain 06/15/2017  . Exposure to blood or body fluid 05/18/2017  . Arthralgia 07/02/2016  . IBS (irritable bowel syndrome) 03/02/2015  . Well adult exam 08/07/2014  . Chest pain, atypical 08/31/2012  . URI (upper respiratory infection) 05/22/2012  . Serologic abnormality 11/15/2011  . BPH (benign prostatic hyperplasia) 08/23/2011  . 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 LUMP OR MASS IN BREAST 11/04/2010  . RENAL COLIC 29/51/8841  . Prostatitis 11/10/2009  . ANEMIA-UNSPECIFIED 03/10/2009  . ABDOMINAL PAIN, LEFT LOWER QUADRANT 03/10/2009  . PANIC DISORDER 02/11/2009  . Incontinence of feces 02/11/2009  . OTHER GENITAL HERPES 08/26/2008  . OTHER SPECIFIED DISORDER OF SKIN 08/26/2008  . DYSURIA 08/16/2008  . LOW BACK PAIN 08/05/2008  . Asthmatic bronchitis 06/28/2008  . Chronic fatigue 04/16/2008  . FLATULENCE 01/09/2008  . INSOMNIA, PERSISTENT 10/10/2007  . CHEST WALL PAIN 10/10/2007  . Diarrhea 10/10/2007  . Anxiety disorder 08/29/2007  . HYPERTENSION 08/29/2007  . Adjustment disorder with mixed anxiety and depressed mood 06/27/2007   s/p Procedure(s): Laparoscopic assisted Ileocecectomy 07/25/2019  -Expected postoperative ileus in setting of perforated appendicitis with peritonitis -NPO except meds and occasional ice chips, MIVF -D/C foley -Ambulate 5x/day with assistance -BID wet to dry dressings to incision -Continue IV abx -If were to develop intractable n/v, would plan NG tube placement -PPx: SCDs,  SQH   LOS: 2 days   Stephanie Coup. Cliffton Asters, M.D. [facility] Use [email or web address] to contact on call provider

## 2019-07-26 NOTE — Plan of Care (Signed)

## 2019-07-27 LAB — GLUCOSE, CAPILLARY: Glucose-Capillary: 94 mg/dL (ref 70–99)

## 2019-07-27 NOTE — Progress Notes (Signed)
Pt's dressing change was done per orders. Pt tolerated well. Pain addressed. PT resting.

## 2019-07-27 NOTE — Progress Notes (Signed)
Subjective No acute events. Nausea improving; no emesis. Hiccups resolved. Abdominal bloating improving. Overall abd pain is much better than before surgery.  Objective: Vital signs in last 24 hours: Temp:  [98.1 F (36.7 C)-99.7 F (37.6 C)] 98.6 F (37 C) (10/30 0554) Pulse Rate:  [76-81] 81 (10/30 0554) Resp:  [14-16] 14 (10/30 0554) BP: (105-133)/(62-87) 133/87 (10/30 0554) SpO2:  [93 %-98 %] 95 % (10/30 0554) Last BM Date: 07/20/19  Intake/Output from previous day: 10/29 0701 - 10/30 0700 In: 2855.6 [I.V.:2730.6; IV Piggyback:125] Out: 500 [Urine:400; Drains:100] Intake/Output this shift: Total I/O In: 21 [P.O.:60] Out: -   Gen: NAD, comfortable CV: RRR Pulm: Normal work of breathing Abd: Soft, ttp around midline; dressing changed - wound base healthy with granulation; no erythema around wound; JP scant ss fluid Ext: SCDs in place  Lab Results: CBC  Recent Labs    07/25/19 0513 07/26/19 0528  WBC 12.4* 11.9*  HGB 12.8* 11.5*  HCT 39.6 36.2*  PLT 204 207   BMET Recent Labs    07/25/19 0513 07/26/19 0528  NA 139 137  K 4.3 4.4  CL 105 104  CO2 25 25  GLUCOSE 106* 117*  BUN 13 11  CREATININE 1.45* 1.28*  CALCIUM 8.4* 8.0*   PT/INR No results for input(s): LABPROT, INR in the last 72 hours. ABG No results for input(s): PHART, HCO3 in the last 72 hours.  Invalid input(s): PCO2, PO2  Studies/Results:  Anti-infectives: Anti-infectives (From admission, onward)   Start     Dose/Rate Route Frequency Ordered Stop   07/25/19 0400  piperacillin-tazobactam (ZOSYN) IVPB 3.375 g     3.375 g 12.5 mL/hr over 240 Minutes Intravenous Every 8 hours 07/25/19 0118     07/24/19 2000  piperacillin-tazobactam (ZOSYN) IVPB 3.375 g     3.375 g 100 mL/hr over 30 Minutes Intravenous  Once 07/24/19 1954 07/24/19 2114       Assessment/Plan: Patient Active Problem List   Diagnosis Date Noted  . Perforated appendicitis 07/24/2019  . Stress and adjustment reaction  03/01/2019  . Febrile illness 02/15/2019  . 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 Therapeutic drug monitoring 08/24/2017  . Right arm pain 06/15/2017  . Exposure to blood or body fluid 05/18/2017  . Arthralgia 07/02/2016  . IBS (irritable bowel syndrome) 03/02/2015  . Well adult exam 08/07/2014  . Chest pain, atypical 08/31/2012  . URI (upper respiratory infection) 05/22/2012  . Serologic abnormality 11/15/2011  . BPH (benign prostatic hyperplasia) 08/23/2011  . 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 LUMP OR MASS IN BREAST 11/04/2010  . RENAL COLIC 51/88/4166  . Prostatitis 11/10/2009  . ANEMIA-UNSPECIFIED 03/10/2009  . ABDOMINAL PAIN, LEFT LOWER QUADRANT 03/10/2009  . PANIC DISORDER 02/11/2009  . Incontinence of feces 02/11/2009  . OTHER GENITAL HERPES 08/26/2008  . OTHER SPECIFIED DISORDER OF SKIN 08/26/2008  . DYSURIA 08/16/2008  . LOW BACK PAIN 08/05/2008  . Asthmatic bronchitis 06/28/2008  . Chronic fatigue 04/16/2008  . FLATULENCE 01/09/2008  . INSOMNIA, PERSISTENT 10/10/2007  . CHEST WALL PAIN 10/10/2007  . Diarrhea 10/10/2007  . Anxiety disorder 08/29/2007  . HYPERTENSION 08/29/2007  . Adjustment disorder with mixed anxiety and depressed mood 06/27/2007   s/p Procedure(s): Laparoscopic assisted Ileocecectomy 07/25/2019  -Expected postoperative ileus in setting of perforated appendicitis with peritonitis - appears to be improving steadily -start clear liquids today, MIVF -Ambulate 5x/day with assistance -BID wet to dry dressings to incision - discussed with nursing this  morning -Continue IV abx today -PPx: SCDs, SQH   LOS: 3 days   Stephanie Coup. Cliffton Asters, M.D. [facility] Use [email or web address] to contact on call provider

## 2019-07-27 NOTE — Anesthesia Postprocedure Evaluation (Signed)
Anesthesia Post Note  Patient: Mark Mcgee  Procedure(s) Performed: Laparoscopic assisted  Ileocecectomy (Abdomen)     Patient location during evaluation: PACU Anesthesia Type: General Level of consciousness: awake and alert Pain management: pain level controlled Vital Signs Assessment: post-procedure vital signs reviewed and stable Respiratory status: spontaneous breathing, nonlabored ventilation, respiratory function stable and patient connected to nasal cannula oxygen Cardiovascular status: blood pressure returned to baseline and stable Postop Assessment: no apparent nausea or vomiting Anesthetic complications: no    Last Vitals:  Vitals:   07/26/19 1941 07/26/19 2333  BP: 113/77 129/79  Pulse: 78 81  Resp: 14 15  Temp: 37 C 37.3 C  SpO2: 98% 95%    Last Pain:  Vitals:   07/26/19 2333  TempSrc: Oral  PainSc:                  Margit Batte

## 2019-07-27 NOTE — Plan of Care (Signed)
  Problem: Nutrition: Goal: Adequate nutrition will be maintained Outcome: Progressing   

## 2019-07-28 ENCOUNTER — Inpatient Hospital Stay (HOSPITAL_COMMUNITY): Payer: 59

## 2019-07-28 LAB — CBC
HCT: 37.1 % — ABNORMAL LOW (ref 39.0–52.0)
Hemoglobin: 12.4 g/dL — ABNORMAL LOW (ref 13.0–17.0)
MCH: 26.1 pg (ref 26.0–34.0)
MCHC: 33.4 g/dL (ref 30.0–36.0)
MCV: 77.9 fL — ABNORMAL LOW (ref 80.0–100.0)
Platelets: 280 10*3/uL (ref 150–400)
RBC: 4.76 MIL/uL (ref 4.22–5.81)
RDW: 13.2 % (ref 11.5–15.5)
WBC: 11.3 10*3/uL — ABNORMAL HIGH (ref 4.0–10.5)
nRBC: 0 % (ref 0.0–0.2)

## 2019-07-28 LAB — BASIC METABOLIC PANEL
Anion gap: 12 (ref 5–15)
BUN: 10 mg/dL (ref 6–20)
CO2: 21 mmol/L — ABNORMAL LOW (ref 22–32)
Calcium: 8.1 mg/dL — ABNORMAL LOW (ref 8.9–10.3)
Chloride: 103 mmol/L (ref 98–111)
Creatinine, Ser: 1.09 mg/dL (ref 0.61–1.24)
GFR calc Af Amer: >60 mL/min (ref >60)
GFR calc non Af Amer: >60 mL/min (ref >60)
Glucose, Bld: 122 mg/dL — ABNORMAL HIGH (ref 70–99)
Potassium: 3.6 mmol/L (ref 3.5–5.1)
Sodium: 136 mmol/L (ref 135–145)

## 2019-07-28 MED ORDER — ONDANSETRON HCL 4 MG/2ML IJ SOLN
4.0000 mg | INTRAMUSCULAR | Status: DC | PRN
  Administered 2019-07-28 – 2019-07-31 (×3): 4 mg via INTRAVENOUS
  Filled 2019-07-28 (×3): qty 2

## 2019-07-28 MED ORDER — DIPHENHYDRAMINE HCL 50 MG/ML IJ SOLN
25.0000 mg | Freq: Once | INTRAMUSCULAR | Status: AC
  Administered 2019-07-28: 25 mg via INTRAVENOUS
  Filled 2019-07-28: qty 1

## 2019-07-28 MED ORDER — PHENOL 1.4 % MT LIQD: 1.0000 | OROMUCOSAL | Status: DC | PRN

## 2019-07-28 MED ORDER — LORAZEPAM 2 MG/ML IJ SOLN
0.5000 mg | INTRAMUSCULAR | Status: DC | PRN
  Administered 2019-07-28 (×2): 1 mg via INTRAVENOUS
  Filled 2019-07-28 (×2): qty 1

## 2019-07-28 MED ORDER — ONDANSETRON 4 MG PO TBDP
4.0000 mg | ORAL_TABLET | Freq: Four times a day (QID) | ORAL | Status: DC | PRN
  Administered 2019-08-01: 4 mg via ORAL
  Filled 2019-07-28: qty 1

## 2019-07-28 MED ORDER — IOHEXOL 300 MG/ML  SOLN
100.0000 mL | Freq: Once | INTRAMUSCULAR | Status: AC | PRN
  Administered 2019-07-28: 100 mL via INTRAVENOUS

## 2019-07-28 NOTE — Progress Notes (Signed)
On call provider paged about patient's Jp draining more than necessary within within 4 hours of shift. Initially, MD instructed RN to empty it when needed and monitor closely.

## 2019-07-28 NOTE — Progress Notes (Addendum)
Patient ID: Mark Mcgee, male   DOB: [birth year], 53 y.o.   MRN: [id number]    3 Days Post-Op  Subjective:  Called to come see patient due to acute onset of new left-sided abdominal pain overnight.  He had an NGT placed due to emesis and this new pain.  He has had very little out of his NGT and his pain is still present.  His pain is on his left side and goes around to his left flank and back.  He is voiding well.     His JP drain output has increased significantly to over 1L of serous fluid.  Otherwise feels a little shaky  ROS: See above, otherwise other systems negative  Objective: Vital signs in last 24 hours: Temp:  [98.2 F (36.8 C)-99.8 F (37.7 C)] 99 F (37.2 C) (10/31 0304) Pulse Rate:  [66-85] 66 (10/31 0304) Resp:  [16-17] 17 (10/30 2309) BP: (129-151)/(86-98) 145/95 (10/31 0304) SpO2:  [90 %-97 %] 97 % (10/31 0304) Last BM Date: 07/20/19  Intake/Output from previous day: 10/30 0701 - 10/31 0700 In: 180 [P.O.:180] Out: 2260 [Urine:1000; Drains:1260] Intake/Output this shift: No intake/output data recorded.  PE: Gen: mild distress secondary to pain Heart: regular, not tachy Lungs: CTAB Abd: soft, but increase distention on left side and more firm especially tracking around his left flank.  He is very tender on the left with voluntary guarding.  Left-sided JP drain with yellow serous output.  Midline wound is clean and open.  NGT with only about 100cc of light sero/bilious output.  Some BS are present.    Lab Results:  Recent Labs    07/26/19 0528  WBC 11.9*  HGB 11.5*  HCT 36.2*  PLT 207   BMET Recent Labs    07/26/19 0528  NA 137  K 4.4  CL 104  CO2 25  GLUCOSE 117*  BUN 11  CREATININE 1.28*  CALCIUM 8.0*   PT/INR No results for input(s): LABPROT, INR in the last 72 hours. CMP     Component Value Date/Time   NA 137 07/26/2019 0528   K 4.4 07/26/2019 0528   CL 104 07/26/2019 0528   CO2 25 07/26/2019 0528   GLUCOSE 117 (H) 07/26/2019 0528    BUN 11 07/26/2019 0528   CREATININE 1.28 (H) 07/26/2019 0528   CALCIUM 8.0 (L) 07/26/2019 0528   PROT 7.0 07/24/2019 1055   ALBUMIN 4.5 07/24/2019 1055   AST 15 07/24/2019 1055   ALT 21 07/24/2019 1055   ALKPHOS 65 07/24/2019 1055   BILITOT 0.9 07/24/2019 1055   GFRNONAA >60 07/26/2019 0528   GFRAA >60 07/26/2019 0528   Lipase     Component Value Date/Time   LIPASE 25 07/24/2019 1055       Studies/Results: Dg Abd Portable 1v  Result Date: 07/28/2019 CLINICAL DATA:  NG tube placement EXAM: PORTABLE ABDOMEN - 1 VIEW COMPARISON:  07/24/2019 FINDINGS: The enteric tube projects over the gastric body. Bowel gas pattern is nonspecific with a few gas distended loops of small bowel scattered throughout the abdomen. There is atelectasis at the lung bases. IMPRESSION: 1. Enteric tube projects over the gastric body. 2. Mildly dilated loops of small bowel scattered throughout the abdomen which may represent an ileus or developing small bowel obstruction. Electronically Signed   By: Katherine Mantle M.D.   On: 07/28/2019 03:56    Anti-infectives: Anti-infectives (From admission, onward)   Start     Dose/Rate Route Frequency Ordered Stop   07/25/19  0400  piperacillin-tazobactam (ZOSYN) IVPB 3.375 g     3.375 g 12.5 mL/hr over 240 Minutes Intravenous Every 8 hours 07/25/19 0118     07/24/19 2000  piperacillin-tazobactam (ZOSYN) IVPB 3.375 g     3.375 g 100 mL/hr over 30 Minutes Intravenous  Once 07/24/19 1954 07/24/19 2114       Assessment/Plan POD 3, s/p lap assisted ileocecectomy for perforated appendicitis -despite minimal output, cont NGT given profuse N/V overnight.  Did pass flatus this am -WD dressing changes to midline wound -cont JP drain -STAT labs ordered -certainly worried about this new acute onset of left sided abdominal pain with more firmness and distention noted on left side.  Tracking around his flank, certainly worried about bleeding or other new findings.  Will  get a STAT CT scan of his abd/pel to further evaluate and rule out need to return to OR for complication. -cont abx therapy  FEN - NPO/NGT VTE - heparin ID - zosyn   LOS: 4 days    Henreitta Cea , [facility] 07/28/2019, 8:11 AM  Agree with above.  His primary complaint to me is his NGT.  With his dilated SB, I think that he needs to keep the NGT.  Will give him ice chips/chloroseptic to try to help throat pain.       His abdominal complaints may be from the dilated SB.  But on my exam, it is the NGT that is bothering him.  Abdominal CT scan - 07/28/2019 - 1. Status post interval midline laparotomy with postoperative findings of appendectomy, ileocecal resection and reanastomosis. Surgical drain is positioned about the low abdomen and right lower quadrant.  2. Redemonstrated fluid collection anterior to the rectum measuring 4.5 x 4.4 cm (series 3, image 86), similar to prior examination. New fluid collection in the retroperitoneum anterior to the iliac bifurcation measuring 4.2 x 2.0 cm (series 3, image 64). .  3. The proximal to mid small bowel is diffusely fluid distended, maximum caliber 4.3 cm. Loops are distended to the point of two adjacent narrowings in the most distal remnant small bowel, adjacent to surgical resection site and retroperitoneal fluid collection with tethered appearing loops in this vicinity (series 3, image 56, 68). In the immediate postoperative setting, generally favor ileus, although partial small bowel obstruction is not excluded. There is gas present in the colon and rectum.  4. There is mild thickening of the urinary bladder wall with adjacent fat stranding and tiny locules of air which appear to be within the bladder wall (series 3, image 91). 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 Correlate with urinalysis.  Alphonsa Overall, MD, [facility]  Surgery Office phone:  [phone]

## 2019-07-28 NOTE — Progress Notes (Signed)
Not a Rapid Response Event  Notified by Mark Mcgee for a nurse consult regarding Mark Mcgee increased JP drainage and escalating abdominal pain. Pt also is reportedly more pale in skin color than earlier tonight. Mark Mcgee is having 10/10 lower abdominal pain and is anxious.  He is tolerating ice chips PO. Since change of shift, Mark Mcgee has drained 850 cc of serous drainage. No acute distress. May be continued ileus problems creating pressure/bloating. Pt did report he passed flatus yesterday. Confirmed with Zella Ball how much Mark Mcgee is ambulating. Pt became nauseated and vomited 50-100 cc of bilious emesis.  Dr. Kieth Brightly notified and order received for NGT.  T 99.0 F, HR 66, 145/95, RR 18, sats 97% on 2L 

## 2019-07-28 NOTE — Progress Notes (Addendum)
New IV is in the patient LUA. We had lengthy conversation about not removing the IV as he is a very difficult stick. Per the IV nurse patient was talking to the IV [place] machine as though it was a speaker.   IV is wrapped with Kerlix and fluids are restarted.

## 2019-07-28 NOTE — Progress Notes (Signed)
Patient woke up with the presence of RN in the room checking on him. He started complaining of not able to breathe, feeling spinning and cold. Vital signs stable, O2- 2L in situ. Patient reassured. Refused anything at the moment.

## 2019-07-28 NOTE — Progress Notes (Signed)
Called in the room. Pt was very anxious,  crying and stated " I'm so sorry I pulled the tube when I was about to use the bathroom, it stucked in between the bed and rails. They are going to kill me!" The suction evacuator tube was detached and replaced a new tube on negative pressure. Pt educated and verbalize understanding. Pt is amenable to put the bed alarm on tonight. Will continue to monitor pt.

## 2019-07-28 NOTE — Progress Notes (Signed)
Received patient to 6N as a transfer from [facility]. Patient alert and oriented x4. No complaints of pain at this time. NGT to LIS. Left JP drain charged to suction. Abdomen dressing clean, dry, and intact. Will continue to monitor.

## 2019-07-28 NOTE — Progress Notes (Signed)
RN rounded on patient at the said time and patient had a c/o having headaches, head fullness, ringing in the ears and not feeling ok but could not describe what actually happening to him. On call CCs provider paged about patient's complains but no orders were given. Instructed RN to keep monitoring patient.  V/s stable at this time.

## 2019-07-28 NOTE — Progress Notes (Signed)
MD paged again that patient not doing any better after reaching out to him. RN asked MD if ok for rapid Response to come set a second eye on him and MD agreed.

## 2019-07-28 NOTE — Progress Notes (Signed)
Rapid Response Nurse contacted.

## 2019-07-28 NOTE — Progress Notes (Addendum)
Responded to patient's bed alarm as he is exiting the bed and headed towards the bathroom. He has pulled out his second IV and NG tube. States he threw up and had to have a bowel movement. He says we can reinsert a new NGT without all the whales and ships attached to it. I explained to him that is the only NG tubing that we use. He states other people in ICU do not use that tubing. He is not willing to have the same NG tubing replaced.   Dr. Georgette Dover notified of situation and request to leave the NGT out for the time being and to have a new IV inserted. He also request to notify the [facility].   Ron, [facility] notified and stated to have new IV inserted and wrapped with kerlix. Should he remove this IV Ron will come to the beside and address the situation at hand.   Patient is also refusing the bed alarm at this time and states "he does not need it on, he is stable on his feet. All the alarms are stressing him out."  Will continue to monitor.

## 2019-07-29 DIAGNOSIS — F13231 Sedative, hypnotic or anxiolytic dependence with withdrawal delirium: Secondary | ICD-10-CM

## 2019-07-29 DIAGNOSIS — R410 Disorientation, unspecified: Secondary | ICD-10-CM

## 2019-07-29 DIAGNOSIS — F13931 Sedative, hypnotic or anxiolytic use, unspecified with withdrawal delirium: Secondary | ICD-10-CM

## 2019-07-29 LAB — CBC
HCT: 35.2 % — ABNORMAL LOW (ref 39.0–52.0)
Hemoglobin: 11.7 g/dL — ABNORMAL LOW (ref 13.0–17.0)
MCH: 26.1 pg (ref 26.0–34.0)
MCHC: 33.2 g/dL (ref 30.0–36.0)
MCV: 78.6 fL — ABNORMAL LOW (ref 80.0–100.0)
Platelets: 318 10*3/uL (ref 150–400)
RBC: 4.48 MIL/uL (ref 4.22–5.81)
RDW: 13.4 % (ref 11.5–15.5)
WBC: 13.2 10*3/uL — ABNORMAL HIGH (ref 4.0–10.5)
nRBC: 0 % (ref 0.0–0.2)

## 2019-07-29 LAB — BASIC METABOLIC PANEL
Anion gap: 15 (ref 5–15)
BUN: 13 mg/dL (ref 6–20)
CO2: 20 mmol/L — ABNORMAL LOW (ref 22–32)
Calcium: 8.4 mg/dL — ABNORMAL LOW (ref 8.9–10.3)
Chloride: 104 mmol/L (ref 98–111)
Creatinine, Ser: 1.08 mg/dL (ref 0.61–1.24)
GFR calc Af Amer: >60 mL/min (ref >60)
GFR calc non Af Amer: >60 mL/min (ref >60)
Glucose, Bld: 100 mg/dL — ABNORMAL HIGH (ref 70–99)
Potassium: 3.6 mmol/L (ref 3.5–5.1)
Sodium: 139 mmol/L (ref 135–145)

## 2019-07-29 MED ORDER — ALPRAZOLAM 0.5 MG PO TABS
2.0000 mg | ORAL_TABLET | Freq: Four times a day (QID) | ORAL | Status: DC | PRN
  Administered 2019-07-29 (×2): 2 mg via ORAL
  Filled 2019-07-29 (×2): qty 4

## 2019-07-29 NOTE — Progress Notes (Addendum)
Patient ID: Mark Mcgee, male   DOB: 1966-03-16, 53 y.o.   MRN: [id number]    4 Days Post-Op  Subjective:  Patient cut his IV tubing into multiple pieces overnight and was bleeding everywhere.  He also pulled his JP drain apart.  Waiting on a Recruitment consultant.  Patient is fidgety and rolling his fingers in bed.  He is talking to himself.  He denies hallucinations, but thinks we are at some "lady's house waiting to cut up a rabbit."  He also randomly tells me about "stringing babies up and then putting them on a plate and lining them up."  He can tell me it is 2020 and that Trump is the president.    He states he takes xanax 4 times a day as needed for anxiety.  This was restarted overnight.  He was given ativan yesterday when he had an NGT in place, which he also pulled out yesterday along with 3 different IVs.  ROS: See above, otherwise other systems negative  Objective: Vital signs in last 24 hours: Temp:  [97.6 F (36.4 C)-98.7 F (37.1 C)] 98.4 F (36.9 C) (11/01 0444) Pulse Rate:  [61-64] 64 (11/01 0444) Resp:  [16] 16 (10/31 1520) BP: (128-134)/(85-92) 128/85 (11/01 0444) SpO2:  [96 %-98 %] 98 % (11/01 0444) Last BM Date: 07/28/19  Intake/Output from previous day: 10/31 0701 - 11/01 0700 In: 789.8 [I.V.:499.8] Out: 1050 [Urine:200; Emesis/NG output:800; Drains:50] Intake/Output this shift: No intake/output data recorded.  PE: Gen: laying in bed in NAD, talking to himself about nonsensical things that don't make any sense.  He has finger rolling present and hands are constantly moving Heart: regular Lungs: CTAB Abd: soft, doesn't seem terribly tender, midline wound is clean and packed, JP in place with serous fluid present. Psych: will open eyes to command, but mostly keeps eyes close, fidgety with hands, oriented to year, self, and president, but doesn't know where he is and randomly talks about nonsensical things.  Lab Results:  Recent Labs    07/28/19 0831  07/29/19 0225  WBC 11.3* 13.2*  HGB 12.4* 11.7*  HCT 37.1* 35.2*  PLT 280 318   BMET Recent Labs    07/28/19 0831 07/29/19 0225  NA 136 139  K 3.6 3.6  CL 103 104  CO2 21* 20*  GLUCOSE 122* 100*  BUN 10 13  CREATININE 1.09 1.08  CALCIUM 8.1* 8.4*   PT/INR No results for input(s): LABPROT, INR in the last 72 hours. CMP     Component Value Date/Time   NA 139 07/29/2019 0225   K 3.6 07/29/2019 0225   CL 104 07/29/2019 0225   CO2 20 (L) 07/29/2019 0225   GLUCOSE 100 (H) 07/29/2019 0225   BUN 13 07/29/2019 0225   CREATININE 1.08 07/29/2019 0225   CALCIUM 8.4 (L) 07/29/2019 0225   PROT 7.0 07/24/2019 1055   ALBUMIN 4.5 07/24/2019 1055   AST 15 07/24/2019 1055   ALT 21 07/24/2019 1055   ALKPHOS 65 07/24/2019 1055   BILITOT 0.9 07/24/2019 1055   GFRNONAA >60 07/29/2019 0225   GFRAA >60 07/29/2019 0225   Lipase     Component Value Date/Time   LIPASE 25 07/24/2019 1055       Studies/Results: Ct Abdomen Pelvis W Contrast  Result Date: 07/28/2019 CLINICAL DATA:  Abdominal pain, fever, postoperative, status post ileocecectomy for perforated appendicitis EXAM: CT ABDOMEN AND PELVIS WITH CONTRAST TECHNIQUE: Multidetector CT imaging of the abdomen and pelvis was performed using the standard  protocol following bolus administration of intravenous contrast. CONTRAST:  142mL OMNIPAQUE IOHEXOL 300 MG/ML SOLN, additional oral enteric contrast COMPARISON:  07/24/2019 FINDINGS: Lower chest: Bandlike scarring or atelectasis of the bilateral lung bases. Hepatobiliary: No solid liver abnormality is seen. No gallstones, gallbladder wall thickening, or biliary dilatation. Pancreas: Unremarkable. No pancreatic ductal dilatation or surrounding inflammatory changes. Spleen: Normal in size without significant abnormality. Adrenals/Urinary Tract: Adrenal glands are unremarkable. Kidneys are normal, without renal calculi, solid lesion, or hydronephrosis. There is mild thickening of the urinary  bladder wall with adjacent fat stranding and tiny locules of air which appear to be within the bladder wall (series 3, image 91). Stomach/Bowel: Stomach is within normal limits. Esophagogastric tube with tip and side port below the diaphragm. The proximal to mid small bowel is diffusely fluid distended, maximum caliber 4.3 cm. Loops are distended to the point of two adjacent narrowings in the most distal remnant small bowel, adjacent to surgical resection site and retroperitoneal fluid collection with tethered appearing loops in this vicinity (series 3, image 56, 68). Status post interval postoperative findings of appendectomy, ileocecal resection and reanastomosis. Surgical drain is positioned about the low abdomen and right lower quadrant. Vascular/Lymphatic: No significant vascular findings are present. No enlarged abdominal or pelvic lymph nodes. Reproductive: No mass or other significant abnormality. Other: Status post midline laparotomy. Redemonstrated fluid collection anterior to the rectum measuring 4.5 x 4.4 cm (series 3, image 86), similar to prior examination. New fluid collection in the retroperitoneum anterior to the iliac bifurcation measuring 4.2 x 2.0 cm (series 3, image 64). Anasarca. Musculoskeletal: No acute or significant osseous findings. IMPRESSION: 1. Status post interval midline laparotomy with postoperative findings of appendectomy, ileocecal resection and reanastomosis. Surgical drain is positioned about the low abdomen and right lower quadrant. 2. Redemonstrated fluid collection anterior to the rectum measuring 4.5 x 4.4 cm (series 3, image 86), similar to prior examination. New fluid collection in the retroperitoneum anterior to the iliac bifurcation measuring 4.2 x 2.0 cm (series 3, image 64). These are nonspecific and may be loculated, possibly reflecting postoperative hematoma, seroma, or abscess. The presence or absence of infection is not established. 3. The proximal to mid small  bowel is diffusely fluid distended, maximum caliber 4.3 cm. Loops are distended to the point of two adjacent narrowings in the most distal remnant small bowel, adjacent to surgical resection site and retroperitoneal fluid collection with tethered appearing loops in this vicinity (series 3, image 56, 68). In the immediate postoperative setting, generally favor ileus, although partial small bowel obstruction is not excluded. There is gas present in the colon and rectum. 4. There is mild thickening of the urinary bladder wall with adjacent fat stranding and tiny locules of air which appear to be within the bladder wall (series 3, image 91). 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 Correlate with urinalysis. Electronically Signed   By: Eddie Candle M.D.   On: 07/28/2019 12:52   Dg Abd Portable 1v  Result Date: 07/28/2019 CLINICAL DATA:  Nasogastric tube placement. EXAM: PORTABLE ABDOMEN - 1 VIEW COMPARISON:  0343 hours FINDINGS: Stable nasogastric tube positioning in the proximal stomach in the region of the fundus. Stable dilated small bowel loops consistent with ileus versus partial small bowel obstruction. IMPRESSION: Stable nasogastric tube positioning in the proximal stomach. Stable dilated small bowel loops consistent with ileus versus partial small bowel obstruction. Electronically Signed   By: Jenness Corner.D.  On: 07/28/2019 11:21   Dg Abd Portable 1v  Result Date: 07/28/2019 CLINICAL DATA:  NG tube placement EXAM: PORTABLE ABDOMEN - 1 VIEW COMPARISON:  07/24/2019 FINDINGS: The enteric tube projects over the gastric body. Bowel gas pattern is nonspecific with a few gas distended loops of small bowel scattered throughout the abdomen. There is atelectasis at the lung bases. IMPRESSION: 1. Enteric tube projects over the gastric body. 2. Mildly dilated loops of small bowel scattered throughout the abdomen which may  represent an ileus or developing small bowel obstruction. Electronically Signed   By: Katherine Mantlehristopher  Green M.D.   On: 07/28/2019 03:56    Anti-infectives: Anti-infectives (From admission, onward)   Start     Dose/Rate Route Frequency Ordered Stop   07/25/19 0400  piperacillin-tazobactam (ZOSYN) IVPB 3.375 g     3.375 g 12.5 mL/hr over 240 Minutes Intravenous Every 8 hours 07/25/19 0118     07/24/19 2000  piperacillin-tazobactam (ZOSYN) IVPB 3.375 g     3.375 g 100 mL/hr over 30 Minutes Intravenous  Once 07/24/19 1954 07/24/19 2114       Assessment/Plan  Psychotic issue - no history of schizophrenia, bipolar D/O, etc, only anxiety.  It would be odd for an otherwise healthy 53 yo male to develop such bad delirium post op.  His xanax has been restarted, but he is getting a Recruitment consultantsafety sitter and needs an urgent psych consult to help determine what is going on with this patient.  POD 4, s/p lap assisted ileocecectomy for perforated appendicitis  -patient has pulled out his NGT and his IVs.  Difficult to really assess patient to determine his readiness for oral intake.  Still with some emesis last night.  -will keep NPO for today  -cont JP drain  -follow WBC, up slightly today to 13.  CT scan results reviewed yesterday and suspect most fluid collections are typical post op fluid collections.  It would be early to already have abscesses, but he could still develop them moving forward.  Will monitor  -mobilize as safely able  -IS, pulm toilet  FEN - NPO VTE - heparin ID - zosyn   LOS: 5 days    Letha CapeKelly E Osborne , [facility] [place] [facility] 07/29/2019, 8:02 AM Please see Amion for pager number during day hours 7:00am-4:30pm  Agree with above. Very odd behavior - possible related to withdrawal from Xanax - he says that he takes 2mg  4 times a day. His behavior has made his post op assessment more difficult.  Ovidio Kinavid Airam Runions, MD, [facility] [place] [place] [facility] Office phone:  [phone]

## 2019-07-29 NOTE — Progress Notes (Signed)
Patient is resting in bed having random conversation with "people" in the room. Bed alarm is on and call bell is within place. Instructed patient to not get up out of bed without calling for assistance. Will continue to monitor.

## 2019-07-29 NOTE — Progress Notes (Signed)
Passed by room 31 and seen that pt was not on the bed.Found pt standing in front of his IV pump and noticed the there is a 10cc NS flush still attached to the IV port. Asked pt what is he trying to do with it? Pt said "I'm trying to locked it". "This machine keeps on beeping the whole night want to stop it. IV line patent but noticed that the regulator was close. Reminded and educated pt to call if needed and not to touch his IV line/site to avoid infection. Follow up sitter with [facility], said they have not come up with today's staffing yet. Will continue to monitor pt.

## 2019-07-29 NOTE — Progress Notes (Signed)
Pt trying to pull the IV out. Pt was very anxious. Pt stated" I usually take Xanax 2 mg at home if I needed it." MD on call was notified and verbally ordered to put the order in per how he is taking at home. Will continue to monitor pt.

## 2019-07-29 NOTE — Consult Note (Signed)
Telepsych Consultation   Reason for Consult: ''agitated, hallucinating, talking to himself, hx of anxiety.'' Referring Physician:  Barnetta Chapel PA-C Location of Patient: [facility] Location of Provider: [facility]  Patient Identification: Mark Mcgee MRN:  [id number] Principal Diagnosis: Benzodiazepine withdrawal with delirium [facility]) Diagnosis:  Principal Problem:   Benzodiazepine withdrawal with delirium [facility]) Active Problems:   Perforated appendicitis   Delirium   Total Time spent with patient: 45 minutes  Subjective:   Mark Mcgee is a 53 y.o. male patient admitted with abdominal pain.  HPI:  53 year old  male who reports past history of GERD, hypertension, hypothyroidism, IBS and Anxiety for which he was prescribed 2 mg Alprazolam 4 times daily as needed by his PCP. Patient reports that he presented to the hospital with 3 days history  of abdominal pain, he was found to have rupture appendix which required surgical intervention. Consult was called due to patient becoming combative, fidgety, agitated, anxious, hallucinating and talking to himself post surgery. Today, patient is alert and oriented to place, person and time. He had no recollection of being combative and was surprised that a psychiatrist is talking to him. He denies prior history of psychosis, delusions, bipolar disorder but states that he was taking Alprazolam  2 mg prescribed by his PCP for anxiety at least 2 to 3 times prior to this admission. Currently, he denies being depressed or suicidal. However, he reports being stressed out, overwhelmed and apprehensive yesterday thinking about how long it will take for him to recover from surgery and to go back to work.   Past Psychiatric History: anxiety  Risk to Self:  denies Risk to Others:   denies Prior Inpatient Therapy:  none reported by the patient Prior Outpatient Therapy:     Past Medical History:  Past Medical History:  Diagnosis Date  .  Anxiety   . Costochondritis    recurrent   . Depression    Dr Dub Mikes  . Fatigue 2009  . Genital herpes 2009  . GERD (gastroesophageal reflux disease)   . Heat stroke   . HTN (hypertension)   . Hyperthyroidism   . IBS (irritable bowel syndrome)   . Panic attack   . Prostatitis    Very painful flare-ups Dr Retta Diones  . Tremor     Past Surgical History:  Procedure Laterality Date  . LAPAROSCOPIC ILEOCECECTOMY  07/25/2019   Procedure: Laparoscopic assisted  Ileocecectomy;  Surgeon: Andria Meuse, MD;  Location: [facility];  Service: General;;  . MANDIBLE SURGERY     Right   Family History:  Family History  Problem Relation Age of Onset  . Hyperlipidemia Other   . Coronary artery disease Other   . Stroke Mother   . Colon cancer Paternal Grandmother   . Diabetes Other   . Breast cancer Other   . Pancreatic cancer Other   . Cancer Other        breast, pancreatic  . Heart disease Other   . Cancer Maternal Grandmother        colon   Family Psychiatric  History:  Social History:  Social History   Substance and Sexual Activity  Alcohol Use Yes   Comment: 2-6 beers every 2/wks     Social History   Substance and Sexual Activity  Drug Use No    Social History   Socioeconomic History  . Marital status: Single    Spouse name: Not on file  . Number of children: Not on file  .  Years of education: Not on file  . Highest education level: Not on file  Occupational History  . Occupation: Teacher, adult educationN    Employer: [employer]  Social Needs  . Financial resource strain: Not on file  . Food insecurity    Worry: Not on file    Inability: Not on file  . Transportation needs    Medical: Not on file    Non-medical: Not on file  Tobacco Use  . Smoking status: Never Smoker  . Smokeless tobacco: Never Used  Substance and Sexual Activity  . Alcohol use: Yes    Comment: 2-6 beers every 2/wks  . Drug use: No  . Sexual activity: Yes  Lifestyle  . Physical activity    Days per  week: Not on file    Minutes per session: Not on file  . Stress: Not on file  Relationships  . Social Musicianconnections    Talks on phone: Not on file    Gets together: Not on file    Attends religious service: Not on file    Active member of club or organization: Not on file    Attends meetings of clubs or organizations: Not on file    Relationship status: Not on file  Other Topics Concern  . Not on file  Social History Narrative  . Not on file   Additional Social History:    Allergies:   Allergies  Allergen Reactions  . Contrast Media [Iodinated Diagnostic Agents] Shortness Of Breath  . Buspirone Hcl Other (See Comments)    Reaction not recalled by the patient  . Divalproex Sodium Other (See Comments)    Severe headaches  . Olanzapine-Fluoxetine Hcl Other (See Comments)    Reaction not recalled by the patient  . Piroxicam Other (See Comments)    Reaction not recalled by the patient  . Wheat Bran Diarrhea and Other (See Comments)    "Tears up my stomach"  . Ibuprofen Rash and Other (See Comments)    Spots appear on chest appear    Labs:  Results for orders placed or performed during the hospital encounter of 07/24/19 (from the past 48 hour(s))  Glucose, capillary     Status: None   Collection Time: 07/27/19 11:05 PM  Result Value Ref Range   Glucose-Capillary 94 70 - 99 mg/dL  CBC     Status: Abnormal   Collection Time: 07/28/19  8:31 AM  Result Value Ref Range   WBC 11.3 (H) 4.0 - 10.5 K/uL   RBC 4.76 4.22 - 5.81 MIL/uL   Hemoglobin 12.4 (L) 13.0 - 17.0 g/dL   HCT 96.037.1 (L) 45.439.0 - 09.852.0 %   MCV 77.9 (L) 80.0 - 100.0 fL   MCH 26.1 26.0 - 34.0 pg   MCHC 33.4 30.0 - 36.0 g/dL   RDW 11.913.2 14.711.5 - 82.915.5 %   Platelets 280 150 - 400 K/uL   nRBC 0.0 0.0 - 0.2 %    Comment: Performed at [facility], [address] [place], [place] [address]  Basic metabolic panel     Status: Abnormal   Collection Time: 07/28/19  8:31 AM  Result Value Ref Range   Sodium 136 135 - 145  mmol/L   Potassium 3.6 3.5 - 5.1 mmol/L   Chloride 103 98 - 111 mmol/L   CO2 21 (L) 22 - 32 mmol/L   Glucose, Bld 122 (H) 70 - 99 mg/dL   BUN 10 6 - 20 mg/dL   Creatinine, Ser 0.861.09 0.61 -  1.24 mg/dL   Calcium 8.1 (L) 8.9 - 10.3 mg/dL   GFR calc non Af Amer >60 >60 mL/min   GFR calc Af Amer >60 >60 mL/min   Anion gap 12 5 - 15    Comment: Performed at [facility] [address] [place], [place] [address]  CBC     Status: Abnormal   Collection Time: 07/29/19  2:25 AM  Result Value Ref Range   WBC 13.2 (H) 4.0 - 10.5 K/uL   RBC 4.48 4.22 - 5.81 MIL/uL   Hemoglobin 11.7 (L) 13.0 - 17.0 g/dL   HCT 35.2 (L) 39.0 - 52.0 %   MCV 78.6 (L) 80.0 - 100.0 fL   MCH 26.1 26.0 - 34.0 pg   MCHC 33.2 30.0 - 36.0 g/dL   RDW 13.4 11.5 - 15.5 %   Platelets 318 150 - 400 K/uL   nRBC 0.0 0.0 - 0.2 %    Comment: Performed at [facility], [place] [address], [place], [place] [address]  Basic metabolic panel     Status: Abnormal   Collection Time: 07/29/19  2:25 AM  Result Value Ref Range   Sodium 139 135 - 145 mmol/L   Potassium 3.6 3.5 - 5.1 mmol/L   Chloride 104 98 - 111 mmol/L   CO2 20 (L) 22 - 32 mmol/L   Glucose, Bld 100 (H) 70 - 99 mg/dL   BUN 13 6 - 20 mg/dL   Creatinine, Ser 1.08 0.61 - 1.24 mg/dL   Calcium 8.4 (L) 8.9 - 10.3 mg/dL   GFR calc non Af Amer >60 >60 mL/min   GFR calc Af Amer >60 >60 mL/min   Anion gap 15 5 - 15    Comment: Performed at [facility], [place] [address] [place], [place] [address]    Medications:  Current Facility-Administered Medications  Medication Dose Route Frequency Provider Last Rate Last Dose  . 0.9 %  sodium chloride infusion   Intravenous Continuous Saverio Danker, PA-C 125 mL/hr at 07/29/19 0932    . acetaminophen (TYLENOL) tablet 1,000 mg  1,000 mg Oral Q6H Meuth, Brooke A, PA-C   1,000 mg at 07/29/19 0513  . ALPRAZolam Duanne Moron) tablet 2 mg  2 mg Oral QID PRN Donnie Mesa, MD   2 mg at 07/29/19 0144  . Chlorhexidine Gluconate Cloth  2 % PADS 6 each  6 each Topical Daily Elder Cyphers, NP   6 each at 07/28/19 6270  . chlorproMAZINE (THORAZINE) 12.5 mg in sodium chloride 0.9 % 25 mL IVPB  12.5 mg Intravenous Q6H PRN Rayburn, Kelly A, PA-C 50 mL/hr at 07/26/19 0942 12.5 mg at 07/26/19 0942  . heparin injection 5,000 Units  5,000 Units Subcutaneous Q8H Meuth, Brooke A, PA-C   5,000 Units at 07/29/19 0513  . HYDROmorphone (DILAUDID) injection 1 mg  1 mg Intravenous Q2H PRN Meuth, Brooke A, PA-C   1 mg at 07/28/19 0758  . LORazepam (ATIVAN) injection 0.5-1 mg  0.5-1 mg Intravenous Q4H PRN Saverio Danker, PA-C   1 mg at 07/28/19 2124  . methocarbamol (ROBAXIN) 500 mg in dextrose 5 % 50 mL IVPB  500 mg Intravenous Q6H PRN Meuth, Brooke A, PA-C 100 mL/hr at 07/28/19 0537 500 mg at 07/28/19 0537  . metoprolol tartrate (LOPRESSOR) injection 5 mg  5 mg Intravenous Q6H PRN Meuth, Brooke A, PA-C      . ondansetron (ZOFRAN) injection 4 mg  4 mg Intravenous Q4H PRN Saverio Danker, PA-C  4 mg at 07/28/19 4782   Or  . ondansetron (ZOFRAN-ODT) disintegrating tablet 4 mg  4 mg Oral Q6H PRN Barnetta Chapel, PA-C      . oxyCODONE (Oxy IR/ROXICODONE) immediate release tablet 5-10 mg  5-10 mg Oral Q4H PRN Meuth, Brooke A, PA-C   10 mg at 07/29/19 0513  . phenol (CHLORASEPTIC) mouth spray 1 spray  1 spray Mouth/Throat PRN Ovidio Kin, MD      . piperacillin-tazobactam (ZOSYN) IVPB 3.375 g  3.375 g Intravenous Q8H Meuth, Brooke A, PA-C 12.5 mL/hr at 07/29/19 0537 3.375 g at 07/29/19 0537  . prochlorperazine (COMPAZINE) tablet 10 mg  10 mg Oral Q6H PRN Meuth, Brooke A, PA-C       Or  . prochlorperazine (COMPAZINE) injection 5-10 mg  5-10 mg Intravenous Q6H PRN Meuth, Brooke A, PA-C   5 mg at 07/26/19 0558  . sodium chloride flush (NS) 0.9 % injection 3 mL  3 mL Intravenous Once Meuth, Brooke A, PA-C        Musculoskeletal: Strength & Muscle Tone: not tested, patient assessed via tele psych Gait & Station: not tested, patient assessed via tele  psych Patient leans: N/A  Psychiatric Specialty Exam: Physical Exam  Psychiatric: He has a normal mood and affect. His speech is normal. Judgment and thought content normal. He is slowed. Cognition and memory are normal.    Review of Systems  Constitutional: Positive for malaise/fatigue.  HENT: Negative.   Eyes: Negative.   Skin: Negative.   Neurological: Negative.   Psychiatric/Behavioral: Negative.     Blood pressure 128/85, pulse 64, temperature 98.4 F (36.9 C), temperature source Oral, resp. rate 16, height  (1.702 m), weight 100.1 kg, SpO2 98 %.Body mass index is 34.56 kg/m.  General Appearance: Casual  Eye Contact:  Good  Speech:  Clear and Coherent  Volume:  Normal  Mood:  Dysphoric  Affect:  Congruent  Thought Process:  Coherent and Linear  Orientation:  Full (Time, Place, and Person)  Thought Content:  Logical  Suicidal Thoughts:  No  Homicidal Thoughts:  No  Memory:  Immediate;   Fair Recent;   Good Remote;   Good  Judgement:  Fair  Insight:  Fair  Psychomotor Activity:  Psychomotor Retardation  Concentration:  Concentration: Fair and Attention Span: Fair  Recall:  Good  Fund of Knowledge:  Good  Language:  Good  Akathisia:  No  Handed:  Right  AIMS (if indicated):     Assets:  Communication Skills Desire for Improvement  ADL's:  Intact  Cognition:  WNL  Sleep:   fair     Treatment Plan Summary: 53 year old male who denies prior history of mental illness bur receiving Alprazolam for Anxiety from PCP. He is few days s/p surgery for ruptured appendix. Patient reportedly became combative, agitated, hallucinating, confused, fidgety and delusional overnight. It is not clear to understand what precipitated patient's symptoms but my hypothesis is post surgical delirium vs Anxiolytic withdrawal syndrome(if patient did not received the Alprazolam while on admission for few days).   Recommendations: -Continue 1:1 sitter for safety -Consider Haldol 5 mg Q6h  PRN for agitation/psychosis if patient relapse into delirium again. -Consider reducing Alprazolam 2 mg to TID PRN for anxiety/agitation-which is the normal dose he was taking at home before admission. You might want to contact patient PCP for further information regarding Alprazolam.    Disposition: No evidence of imminent risk to self or others at present.   Supportive therapy provided  about ongoing stressors. Psychiatric service signing out. Re-consult as needed  This service was provided via telemedicine using a 2-way, interactive audio and video technology.  Names of all persons participating in this telemedicine service and their role in this encounter. Name: Mohanad Carsten Role: Patient  Name: Thedore Mins, MD Role: Psychiatrist    Thedore Mins, MD 07/29/2019 12:54 PM

## 2019-07-30 LAB — CBC
HCT: 30.1 % — ABNORMAL LOW (ref 39.0–52.0)
Hemoglobin: 9.9 g/dL — ABNORMAL LOW (ref 13.0–17.0)
MCH: 26.2 pg (ref 26.0–34.0)
MCHC: 32.9 g/dL (ref 30.0–36.0)
MCV: 79.6 fL — ABNORMAL LOW (ref 80.0–100.0)
Platelets: 296 10*3/uL (ref 150–400)
RBC: 3.78 MIL/uL — ABNORMAL LOW (ref 4.22–5.81)
RDW: 13.8 % (ref 11.5–15.5)
WBC: 11.9 10*3/uL — ABNORMAL HIGH (ref 4.0–10.5)
nRBC: 0.3 % — ABNORMAL HIGH (ref 0.0–0.2)

## 2019-07-30 MED ORDER — ALPRAZOLAM 0.5 MG PO TABS
2.0000 mg | ORAL_TABLET | Freq: Three times a day (TID) | ORAL | Status: DC | PRN
  Administered 2019-07-30 – 2019-08-01 (×4): 2 mg via ORAL
  Filled 2019-07-30 (×4): qty 4

## 2019-07-30 MED ORDER — HALOPERIDOL LACTATE 5 MG/ML IJ SOLN: 5.0000 mg | Freq: Four times a day (QID) | INTRAMUSCULAR | Status: DC | PRN

## 2019-07-30 MED ORDER — ENOXAPARIN SODIUM 40 MG/0.4ML ~~LOC~~ SOLN
40.0000 mg | Freq: Every day | SUBCUTANEOUS | Status: DC
  Administered 2019-07-30 – 2019-07-31 (×2): 40 mg via SUBCUTANEOUS
  Filled 2019-07-30 (×3): qty 0.4

## 2019-07-30 MED ORDER — BOOST / RESOURCE BREEZE PO LIQD CUSTOM
1.0000 | Freq: Three times a day (TID) | ORAL | Status: DC
  Administered 2019-07-30 – 2019-07-31 (×2): 1 via ORAL

## 2019-07-30 MED ORDER — HYDROCODONE-ACETAMINOPHEN 7.5-325 MG PO TABS
1.0000 | ORAL_TABLET | ORAL | Status: DC | PRN
  Administered 2019-07-30: 1 via ORAL
  Filled 2019-07-30 (×2): qty 1

## 2019-07-30 MED ORDER — ADULT MULTIVITAMIN W/MINERALS CH
1.0000 | ORAL_TABLET | Freq: Every day | ORAL | Status: DC
  Administered 2019-07-30 – 2019-08-01 (×3): 1 via ORAL
  Filled 2019-07-30 (×3): qty 1

## 2019-07-30 NOTE — Progress Notes (Addendum)
Initial Nutrition Assessment  DOCUMENTATION CODES:   Obesity unspecified  INTERVENTION:  Boost Breeze 3x/day. each supplement provides 250 calories and 9g of protein. Multivitamin with mineral daily. follow for diet advancement   NUTRITION DIAGNOSIS:   Increased nutrient needs related to post-op healing as evidenced by estimated needs.   GOAL:   Patient will meet greater than or equal to 90% of their needs    MONITOR:   PO intake, Supplement acceptance, Diet advancement, Labs, Weight trends, Skin, I & O's  REASON FOR ASSESSMENT:   Malnutrition Screening Tool    ASSESSMENT:   53 yo male admitted for appendicitis. Had laparoscopic appendectomy on 10/28. PMH includes anxiety, depression, GERD, IBS, HTN, Hyperthyroidism.  Pt states that he was drinking mostly broth and full liquids for about 3 days prior to admission because of lower abdominal pain, nausea and vomiting. Before this, his typical intake included a variety of different foods, and eats breakfast, lunch and dinner. He states that wheat seems to exacerbate his IBS symptoms so he steers clear of wheat containing foods. He says that his weight fluctuates between 175 and 220lbs which is consistent with his weight history. Explained the importance of increasing calorie and protein intake for post surgery healing. Patient indicated he was concerned about meeting his protein needs and was eager to comply with recommendations. Recommended boost breeze while on clear liquids, monitor for advancement of diet. Labs and medications reviewed.   NUTRITION - FOCUSED PHYSICAL EXAM:    Most Recent Value  Orbital Region  No depletion  Upper Arm Region  No depletion  Thoracic and Lumbar Region  No depletion  Buccal Region  No depletion  Temple Region  No depletion  Clavicle Bone Region  No depletion  Clavicle and Acromion Bone Region  No depletion  Scapular Bone Region  No depletion  Dorsal Hand  No depletion  Patellar Region  No  depletion  Anterior Thigh Region  No depletion  Posterior Calf Region  No depletion  Edema (RD Assessment)  Mild  Hair  Reviewed  Eyes  Reviewed  Mouth  Unable to assess  Skin  Reviewed  Nails  Reviewed       Diet Order:   Diet Order            Diet clear liquid Room service appropriate? Yes; Fluid consistency: Thin  Diet effective now              EDUCATION NEEDS:   Education needs have been addressed  Skin:  Skin Assessment: Reviewed RN Assessment  Last BM:     Height:   Ht Readings from Last 1 Encounters:  07/25/19 5\' 7"  (1.702 m)    Weight:   Wt Readings from Last 1 Encounters:  07/25/19 100.1 kg    Ideal Body Weight:  67 kg  BMI:  Body mass index is 34.56 kg/m.  Estimated Nutritional Needs:   Kcal:  1800-2000 calories  Protein:  100-120 g  Fluid:  1.8-2 L    Meda Klinefelter, Dietetic Intern

## 2019-07-30 NOTE — Progress Notes (Signed)
5 Days Post-Op  Subjective: CC: Abdominal pain Patient seems more appropriate today, compared to notes from the weekend. Pleasant. Says that he is thirsty and hungry. Still gets occasional nausea but only when he is up and walking. Pain is about the same as yesterday, mainly lower abdomen, more on the left. He is passing flatus and had 3 small, loose bm's yesterday. Walked in the hall yesterday as well.    Objective: Vital signs in last 24 hours: Temp:  [97.5 F (36.4 C)-98.9 F (37.2 C)] 97.5 F (36.4 C) (11/02 0617) Pulse Rate:  [48-61] 61 (11/02 0617) Resp:  [16-24] 16 (11/02 0617) BP: (126-131)/(75-85) 131/84 (11/02 0617) SpO2:  [97 %-98 %] 98 % (11/02 0617) Last BM Date: 07/28/19  Intake/Output from previous day: 11/01 0701 - 11/02 0700 In: 2674.5 [I.V.:2218.4; IV Piggyback:456.1] Out: 130 [Drains:130] Intake/Output this shift: No intake/output data recorded.  PE: Gen:  Alert, NAD, pleasant Card:  RRR Pulm:  CTA b/l, normal rate and effort Abd: Soft, ND, doesn't seem tender out of proproportion of normal post op tenderness. Midline wound is packed with healthy granulation tissue at the base. +BS. Drain in LLQ in place with SS drainage, 130/24 hours.  Ext:  No LE edema  Lab Results:  Recent Labs    07/29/19 0225 07/30/19 0423  WBC 13.2* 11.9*  HGB 11.7* 9.9*  HCT 35.2* 30.1*  PLT 318 296   BMET Recent Labs    07/28/19 0831 07/29/19 0225  NA 136 139  K 3.6 3.6  CL 103 104  CO2 21* 20*  GLUCOSE 122* 100*  BUN 10 13  CREATININE 1.09 1.08  CALCIUM 8.1* 8.4*   PT/INR No results for input(s): LABPROT, INR in the last 72 hours. CMP     Component Value Date/Time   NA 139 07/29/2019 0225   K 3.6 07/29/2019 0225   CL 104 07/29/2019 0225   CO2 20 (L) 07/29/2019 0225   GLUCOSE 100 (H) 07/29/2019 0225   BUN 13 07/29/2019 0225   CREATININE 1.08 07/29/2019 0225   CALCIUM 8.4 (L) 07/29/2019 0225   PROT 7.0 07/24/2019 1055   ALBUMIN 4.5 07/24/2019 1055   AST 15 07/24/2019 1055   ALT 21 07/24/2019 1055   ALKPHOS 65 07/24/2019 1055   BILITOT 0.9 07/24/2019 1055   GFRNONAA >60 07/29/2019 0225   GFRAA >60 07/29/2019 0225   Lipase     Component Value Date/Time   LIPASE 25 07/24/2019 1055       Studies/Results: Ct Abdomen Pelvis W Contrast  Result Date: 07/28/2019 CLINICAL DATA:  Abdominal pain, fever, postoperative, status post ileocecectomy for perforated appendicitis EXAM: CT ABDOMEN AND PELVIS WITH CONTRAST TECHNIQUE: Multidetector CT imaging of the abdomen and pelvis was performed using the standard protocol following bolus administration of intravenous contrast. CONTRAST:  OMNIPAQUE IOHEXOL 300 MG/ML SOLN, additional oral enteric contrast COMPARISON:  07/24/2019 FINDINGS: Lower chest: Bandlike scarring or atelectasis of the bilateral lung bases. Hepatobiliary: No solid liver abnormality is seen. No gallstones, gallbladder wall thickening, or biliary dilatation. Pancreas: Unremarkable. No pancreatic ductal dilatation or surrounding inflammatory changes. Spleen: Normal in size without significant abnormality. Adrenals/Urinary Tract: Adrenal glands are unremarkable. Kidneys are normal, without renal calculi, solid lesion, or hydronephrosis. There is mild thickening of the urinary bladder wall with adjacent fat stranding and tiny locules of air which appear to be within the bladder wall (series 3, image 91). Stomach/Bowel: Stomach is within normal limits. Esophagogastric tube with tip and side port below  the diaphragm. The proximal to mid small bowel is diffusely fluid distended, maximum caliber 4.3 cm. Loops are distended to the point of two adjacent narrowings in the most distal remnant small bowel, adjacent to surgical resection site and retroperitoneal fluid collection with tethered appearing loops in this vicinity (series 3, image 56, 68). Status post interval postoperative findings of appendectomy, ileocecal resection and reanastomosis.  Surgical drain is positioned about the low abdomen and right lower quadrant. Vascular/Lymphatic: No significant vascular findings are present. No enlarged abdominal or pelvic lymph nodes. Reproductive: No mass or other significant abnormality. Other: Status post midline laparotomy. Redemonstrated fluid collection anterior to the rectum measuring 4.5 x 4.4 cm (series 3, image 86), similar to prior examination. New fluid collection in the retroperitoneum anterior to the iliac bifurcation measuring 4.2 x 2.0 cm (series 3, image 64). Anasarca. Musculoskeletal: No acute or significant osseous findings. IMPRESSION: 1. Status post interval midline laparotomy with postoperative findings of appendectomy, ileocecal resection and reanastomosis. Surgical drain is positioned about the low abdomen and right lower quadrant. 2. Redemonstrated fluid collection anterior to the rectum measuring 4.5 x 4.4 cm (series 3, image 86), similar to prior examination. New fluid collection in the retroperitoneum anterior to the iliac bifurcation measuring 4.2 x 2.0 cm (series 3, image 64). These are nonspecific and may be loculated, possibly reflecting postoperative hematoma, seroma, or abscess. The presence or absence of infection is not established. 3. The proximal to mid small bowel is diffusely fluid distended, maximum caliber 4.3 cm. Loops are distended to the point of two adjacent narrowings in the most distal remnant small bowel, adjacent to surgical resection site and retroperitoneal fluid collection with tethered appearing loops in this vicinity (series 3, image 56, 68). In the immediate postoperative setting, generally favor ileus, although partial small bowel obstruction is not excluded. There is gas present in the colon and rectum. 4. There is mild thickening of the urinary bladder wall with adjacent fat stranding and tiny locules of air which appear to be within the bladder wall (series 3, image 91). 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 Correlate with urinalysis. Electronically Signed   By: Eddie Candle M.D.   On: 07/28/2019 12:52   Dg Abd Portable 1v  Result Date: 07/28/2019 CLINICAL DATA:  Nasogastric tube placement. EXAM: PORTABLE ABDOMEN - 1 VIEW COMPARISON:  0343 hours FINDINGS: Stable nasogastric tube positioning in the proximal stomach in the region of the fundus. Stable dilated small bowel loops consistent with ileus versus partial small bowel obstruction. IMPRESSION: Stable nasogastric tube positioning in the proximal stomach. Stable dilated small bowel loops consistent with ileus versus partial small bowel obstruction. Electronically Signed   By: Aletta Edouard M.D.   On: 07/28/2019 11:21    Anti-infectives: Anti-infectives (From admission, onward)   Start     Dose/Rate Route Frequency Ordered Stop   07/25/19 0400  piperacillin-tazobactam (ZOSYN) IVPB 3.375 g     3.375 g 12.5 mL/hr over 240 Minutes Intravenous Every 8 hours 07/25/19 0118     07/24/19 2000  piperacillin-tazobactam (ZOSYN) IVPB 3.375 g     3.375 g 100 mL/hr over 30 Minutes Intravenous  Once 07/24/19 1954 07/24/19 2114       Assessment/Plan Psychotic issue -  Appreciate psych input. 1:1 safety sitter ordered. Home Alprazolam ordered. PRN Haldol ordered.   Perforated Appendicitis S/p lap assisted ileocecectomy - Dr. Annye English - 07/25/2019 - POD #5 -Start clear liquids today            -  Cont JP drain -Cont Abx  -Cont to follow WBC. Down today to 11.9.  CT scan results reviewed from 10/31 and suspect most fluid collections are typical post op fluid collections.  It would be early to already have abscesses, but he could still develop them moving forward.  Will monitor -mobilize as safely able -IS, pulm toilet  FEN -CLD VTE -heparin ID -Zosyn 10/27 >>    LOS: 6 days    Jacinto HalimMichael M Ridley Dileo , [facility] 07/30/2019, 8:00 AM  Please see Amion for pager number during day hours 7:00am-4:30pm

## 2019-07-31 LAB — BASIC METABOLIC PANEL
Anion gap: 11 (ref 5–15)
BUN: 10 mg/dL (ref 6–20)
CO2: 22 mmol/L (ref 22–32)
Calcium: 8.1 mg/dL — ABNORMAL LOW (ref 8.9–10.3)
Chloride: 105 mmol/L (ref 98–111)
Creatinine, Ser: 1.13 mg/dL (ref 0.61–1.24)
GFR calc Af Amer: >60 mL/min (ref >60)
GFR calc non Af Amer: >60 mL/min (ref >60)
Glucose, Bld: 95 mg/dL (ref 70–99)
Potassium: 3 mmol/L — ABNORMAL LOW (ref 3.5–5.1)
Sodium: 138 mmol/L (ref 135–145)

## 2019-07-31 MED ORDER — ACETAMINOPHEN 500 MG PO TABS
1000.0000 mg | ORAL_TABLET | Freq: Four times a day (QID) | ORAL | Status: DC
  Administered 2019-07-31 – 2019-08-01 (×5): 1000 mg via ORAL
  Filled 2019-07-31 (×5): qty 2

## 2019-07-31 MED ORDER — TRAMADOL HCL 50 MG PO TABS
50.0000 mg | ORAL_TABLET | Freq: Four times a day (QID) | ORAL | Status: DC | PRN
  Administered 2019-07-31 – 2019-08-01 (×3): 100 mg via ORAL
  Filled 2019-07-31 (×4): qty 2

## 2019-07-31 MED ORDER — ENSURE ENLIVE PO LIQD
237.0000 mL | Freq: Three times a day (TID) | ORAL | Status: DC
  Administered 2019-07-31 – 2019-08-01 (×3): 237 mL via ORAL

## 2019-07-31 MED ORDER — POTASSIUM CHLORIDE CRYS ER 20 MEQ PO TBCR
40.0000 meq | EXTENDED_RELEASE_TABLET | Freq: Two times a day (BID) | ORAL | Status: AC
  Administered 2019-07-31 (×2): 40 meq via ORAL
  Filled 2019-07-31 (×2): qty 2

## 2019-07-31 MED ORDER — METHOCARBAMOL 500 MG PO TABS
500.0000 mg | ORAL_TABLET | Freq: Four times a day (QID) | ORAL | Status: DC | PRN
  Administered 2019-07-31 (×2): 500 mg via ORAL
  Filled 2019-07-31 (×3): qty 1

## 2019-07-31 NOTE — Progress Notes (Signed)
Nutrition Follow-up  DOCUMENTATION CODES:   Obesity unspecified  INTERVENTION:  D/C'd Boost Breeze  Ensure Enlive TID Each supplement provides 350 calories and 20g protein  Magic cup TID each supplement provides 290 calories and 9g protien.  Multivitamin with minerals daily.   NUTRITION DIAGNOSIS:   Increased nutrient needs related to post-op healing as evidenced by estimated needs.  Ongoing   GOAL:   Patient will meet greater than or equal to 90% of their needs  progressing  MONITOR:   PO intake, Supplement acceptance, Diet advancement, Labs, Weight trends, Skin, I & O's  REASON FOR ASSESSMENT:   Malnutrition Screening Tool    ASSESSMENT:   53 yo male admitted for appendicitis. Had laparoscopic appendectomy on 10/28. PMH includes anxiety, depression, GERD, IBS, HTN, Hyperthyroidism.  Patient c/o early satiety and strongly wishing to avoid emesis. States that he is "trying to take it slowly" in regards to the amount of food he consumes at each meal. Continues to be concerned about meeting protein needs. States that it is painful near his incision site to sit up fully to eat. Ate approximately 50% of grits for breakfast, and is consuming lemon ice pops. States that bowel movements have been loose. States that he tolerated boost breeze supplements well. He is interested in trying Ensure and magic cup In order to intake more calories and protein.    Diet Order:   Diet Order            Diet regular Room service appropriate? Yes; Fluid consistency: Thin  Diet effective now              EDUCATION NEEDS:   Education needs have been addressed  Skin:  Skin Assessment: Skin Integrity Issues: Skin Integrity Issues:: Incisions Incisions: closed abdomen  Last BM:     Height:   Ht Readings from Last 1 Encounters:  07/25/19 5\' 7"  (1.702 m)    Weight:   Wt Readings from Last 1 Encounters:  07/25/19 100.1 kg    Ideal Body Weight:  67 kg  BMI:  Body mass  index is 34.56 kg/m.  Estimated Nutritional Needs:   Kcal:  1800-2000 calories  Protein:  100-120 g  Fluid:  1.8-2 L   Meda Klinefelter, Dietetic Intern

## 2019-07-31 NOTE — Plan of Care (Signed)
  Problem: Activity: Goal: Risk for activity intolerance will decrease Outcome: Progressing   Problem: Nutrition: Goal: Adequate nutrition will be maintained Outcome: Progressing   Problem: Elimination: Goal: Will not experience complications related to bowel motility Outcome: Progressing   Problem: Pain Managment: Goal: General experience of comfort will improve Outcome: Progressing   Problem: Skin Integrity: Goal: Risk for impaired skin integrity will decrease Outcome: Progressing   

## 2019-07-31 NOTE — Progress Notes (Signed)
Patient ID: Mark Mcgee, male   DOB: 09-13-1966, 53 y.o.   MRN: [id number]    6 Days Post-Op  Subjective: More appropriate today, but still won't really open eyes during our conversation and still seems sleepy.  Scared to eat.  Only had some broth yesterday but not necessarily having any nausea.  Some "paste-like" BMs yesterday.  States he ambulated some.    ROS: See above, otherwise other systems negative  Objective: Vital signs in last 24 hours: Temp:  [98.3 F (36.8 C)-98.6 F (37 C)] 98.6 F (37 C) (11/03 0650) Pulse Rate:  [48-82] 82 (11/03 0650) Resp:  [18-20] 18 (11/03 0650) BP: (127-132)/(79-89) 127/89 (11/03 0650) SpO2:  [95 %-100 %] 100 % (11/03 0650) Last BM Date: 07/30/19  Intake/Output from previous day: 11/02 0701 - 11/03 0700 In: 840 [P.O.:840] Out: 230 [Drains:230] Intake/Output this shift: No intake/output data recorded.  PE: Abd: soft, obese, appropriately tender, midline wound is clean and packed.  JP with some serous drainage around drain and in drain.  Great BS  Lab Results:  Recent Labs    07/29/19 0225 07/30/19 0423  WBC 13.2* 11.9*  HGB 11.7* 9.9*  HCT 35.2* 30.1*  PLT 318 296   BMET Recent Labs    07/29/19 0225 07/31/19 0237  NA 139 138  K 3.6 3.0*  CL 104 105  CO2 20* 22  GLUCOSE 100* 95  BUN 13 10  CREATININE 1.08 1.13  CALCIUM 8.4* 8.1*   PT/INR No results for input(s): LABPROT, INR in the last 72 hours. CMP     Component Value Date/Time   NA 138 07/31/2019 0237   K 3.0 (L) 07/31/2019 0237   CL 105 07/31/2019 0237   CO2 22 07/31/2019 0237   GLUCOSE 95 07/31/2019 0237   BUN 10 07/31/2019 0237   CREATININE 1.13 07/31/2019 0237   CALCIUM 8.1 (L) 07/31/2019 0237   PROT 7.0 07/24/2019 1055   ALBUMIN 4.5 07/24/2019 1055   AST 15 07/24/2019 1055   ALT 21 07/24/2019 1055   ALKPHOS 65 07/24/2019 1055   BILITOT 0.9 07/24/2019 1055   GFRNONAA >60 07/31/2019 0237   GFRAA >60 07/31/2019 0237   Lipase     Component Value  Date/Time   LIPASE 25 07/24/2019 1055       Studies/Results: No results found.  Anti-infectives: Anti-infectives (From admission, onward)   Start     Dose/Rate Route Frequency Ordered Stop   07/25/19 0400  piperacillin-tazobactam (ZOSYN) IVPB 3.375 g     3.375 g 12.5 mL/hr over 240 Minutes Intravenous Every 8 hours 07/25/19 0118     07/24/19 2000  piperacillin-tazobactam (ZOSYN) IVPB 3.375 g     3.375 g 100 mL/hr over 30 Minutes Intravenous  Once 07/24/19 1954 07/24/19 2114       Assessment/Plan Psychotic issue- Appreciate psych input. Likely xanax withdrawal.  Improving now that he is back on home xanax.  POD 6, s/p lap assisted ileocecectomy for perforated appendicitis -adv to regular diet.  He can pick whatever he would like to eat off of this as he seems to have good bowel function -mobilize -cont IV abx therapy, otherwise SLIV -ultram, tylenol, robaxin for pain.  Stop IV pain meds and try to minimize sedating meds -cont IS and pulm toilet  FEN - regular diet, hypokalemia- replace K today and check labs in am VTE -heparin ID -Zosyn 10/27 >>    LOS: 7 days    Henreitta Cea , [facility]  07/31/2019, 8:28 AM Please see Amion for pager number during day hours 7:00am-4:30pm

## 2019-07-31 NOTE — Plan of Care (Signed)

## 2019-08-01 LAB — CBC
HCT: 31.6 % — ABNORMAL LOW (ref 39.0–52.0)
Hemoglobin: 10.6 g/dL — ABNORMAL LOW (ref 13.0–17.0)
MCH: 26 pg (ref 26.0–34.0)
MCHC: 33.5 g/dL (ref 30.0–36.0)
MCV: 77.6 fL — ABNORMAL LOW (ref 80.0–100.0)
Platelets: 392 10*3/uL (ref 150–400)
RBC: 4.07 MIL/uL — ABNORMAL LOW (ref 4.22–5.81)
RDW: 13.5 % (ref 11.5–15.5)
WBC: 10.3 10*3/uL (ref 4.0–10.5)
nRBC: 0.4 % — ABNORMAL HIGH (ref 0.0–0.2)

## 2019-08-01 LAB — BASIC METABOLIC PANEL
Anion gap: 8 (ref 5–15)
BUN: 8 mg/dL (ref 6–20)
CO2: 25 mmol/L (ref 22–32)
Calcium: 8.4 mg/dL — ABNORMAL LOW (ref 8.9–10.3)
Chloride: 106 mmol/L (ref 98–111)
Creatinine, Ser: 1.18 mg/dL (ref 0.61–1.24)
GFR calc Af Amer: >60 mL/min (ref >60)
GFR calc non Af Amer: >60 mL/min (ref >60)
Glucose, Bld: 119 mg/dL — ABNORMAL HIGH (ref 70–99)
Potassium: 3.6 mmol/L (ref 3.5–5.1)
Sodium: 139 mmol/L (ref 135–145)

## 2019-08-01 MED ORDER — ONDANSETRON HCL 4 MG PO TABS: 4.0000 mg | ORAL_TABLET | Freq: Three times a day (TID) | ORAL | 0 refills | Status: DC | PRN

## 2019-08-01 MED ORDER — TRAMADOL HCL 50 MG PO TABS: 50.0000 mg | ORAL_TABLET | Freq: Four times a day (QID) | ORAL | 0 refills | Status: DC | PRN

## 2019-08-01 MED ORDER — AMOXICILLIN-POT CLAVULANATE 875-125 MG PO TABS: 1.0000 | ORAL_TABLET | Freq: Two times a day (BID) | ORAL | 0 refills | Status: DC

## 2019-08-01 NOTE — Progress Notes (Signed)
Patient discharged to home. Verbalized understanding of all discharge instructions including incision care, discharge medications and follow up MD visits. 

## 2019-08-01 NOTE — Discharge Summary (Signed)
Patient ID: Mark Mcgee [id number] [birth year] 53 y.o.  Admit date: 07/24/2019 Discharge date: 08/01/2019  Admitting Diagnosis: Acute appendicitis with perforation and peritonitis.  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   Therapeutic drug monitoring 08/24/2017   Right arm pain 06/15/2017   Exposure to blood or body fluid 05/18/2017   Arthralgia 07/02/2016   IBS (irritable bowel syndrome) 03/02/2015   Well adult exam 08/07/2014   Chest pain, atypical 08/31/2012   URI (upper respiratory infection) 05/22/2012   Serologic abnormality 11/15/2011   BPH (benign prostatic hyperplasia) 08/23/2011   Hypogonadism male 12/22/2010   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   LUMP OR MASS IN BREAST 11/04/2010   RENAL COLIC 10/22/2010   Prostatitis 11/10/2009   ANEMIA-UNSPECIFIED 03/10/2009   ABDOMINAL PAIN, LEFT LOWER QUADRANT 03/10/2009   PANIC DISORDER 02/11/2009   Incontinence of feces 02/11/2009   OTHER GENITAL HERPES 08/26/2008   OTHER SPECIFIED DISORDER OF SKIN 08/26/2008   DYSURIA 08/16/2008   LOW BACK PAIN 08/05/2008   Asthmatic bronchitis 06/28/2008   Chronic fatigue 04/16/2008   FLATULENCE 01/09/2008   INSOMNIA, PERSISTENT 10/10/2007   CHEST WALL PAIN 10/10/2007   Diarrhea 10/10/2007   Anxiety disorder 08/29/2007   HYPERTENSION 08/29/2007   Adjustment disorder with mixed anxiety and depressed mood 06/27/2007    Consultants Psychiatry   H&P:  Mark Mcgee is an 53 y.o. male who is followed by Dr. Russella Dar with IBS has had constipation and lower abdominal pain since Friday.  The pain grew in intensitiy  and he presented to the [facility] ER where a CT scan revealed a perforated appendicitis with phlegmon and fluid (no definable abscess found).  He is very uncomfortable and tender and after discussing management strategies he would like to proceed with lap/open appendectomy.  Will make arrangements for that to occur per Dr. Dwain Sarna.    Procedures Dr. Cliffton Asters - Laparoscopic Assisted Ileocecectomy - 07/25/2019  Hospital Course:  Patient was admitted to the general surgery service on 07/24/2019. He was taken to the OR by Dr. Cliffton Asters on 10/28 where he was found to have perforated, gangrenous appendicitis and underwent a laparoscopic-assisted ileocecectomy.  A 19 French round Blake drain was left in place at time of procedure.  Patient tolerated procedure well and was transferred back to the floor.  Foley was discontinued on postop day 1.  He was left on bowel rest postop day 1 for expected postoperative ileus in the setting of perforated appendicitis with peritonitis.  He was started on clear liquids, postop day 2.  He developed emesis and had a NG tube placed later that night.  He also developed escalating left-sided abdominal pain for which a CT was obtained and showed fluid collections in the lower abdomen that were likely postoperative.  During hospitalization he was monitored with serial exams, vitals and labs.  His exam improved each day and white blood cell count trended and normalized. On 11/1, per notes patient had episode where he cut his IV tubing in multiple places, pulled his JP drain prior, was "talking to himself".  It appears the patient took his Xanax 4 times daily as needed for anxiety at home.  This was reordered.  Psychiatry was consulted and felt episode was likely  secondary to post surgical delirium vs anxiolytic withdrawal syndrome. When I saw the patient the next day, he was appropriate and this had resolved. Patient began having bowel function and diet was advanced as tolerated. JP drain remained  SS and was removed 11/4. On 08/01/2019, the patients WBC has normalized, he was voiding well, tolerating diet (had chicken salad for dinner the night before without n/v), ambulating well, pain well controlled, vital signs stable, midline wound clean, having bowel function and felt stable for discharge home. Follow up arranged as below. He was provided a note for work. He reports he has help at home for midline wound dressing changes.   Physical Exam: Gen:  Alert, NAD, pleasant Card:  RRR Pulm:  CTA b/l, normal rate and effort Abd: Soft, ND, appropriate post op tenderness that is generalized. Exam is quite improved from the previous exam I performed on 11/2. There is no point tenderness or peritoneal signs. Midline wound is packed with healthy granulation tissue at the base. +BS. Drain in LLQ in place with SS drainage Ext:  No LE edema  Allergies as of 08/01/2019      Reactions   Contrast Media [iodinated Diagnostic Agents] Shortness Of Breath   Buspirone Hcl Other (See Comments)   Reaction not recalled by the patient   Divalproex Sodium Other (See Comments)   Severe headaches   Olanzapine-fluoxetine Hcl Other (See Comments)   Reaction not recalled by the patient   Piroxicam Other (See Comments)   Reaction not recalled by the patient   Wheat Bran Diarrhea, Other (See Comments)   "Tears up my stomach"   Ibuprofen Rash, Other (See Comments)   Spots appear on chest appear      Medication List    STOP taking these medications   azithromycin 250 MG tablet Commonly known as: Zithromax Z-Pak   Eluxadoline 100 MG Tabs Commonly known as: Viberzi   ibuprofen 400 MG tablet Commonly known as: ADVIL   Vitamin D (Ergocalciferol) 1.25 MG (50000 UT) Caps capsule Commonly known as: DRISDOL     TAKE these medications   acetaminophen 325 MG tablet Commonly known as: TYLENOL Take 325-650 mg by mouth every 6 (six) hours as needed for mild pain or headache.   acyclovir 400 MG tablet Commonly  known as: ZOVIRAX TAKE 1 TABLET BY MOUTH 4 TIMES DAILY What changed:   how much to take  how to take this  when to take this  reasons to take this  additional instructions   albuterol 108 (90 Base) MCG/ACT inhaler Commonly known as: VENTOLIN HFA Inhale 2 puffs into the lungs every 4 (four) hours as needed for wheezing or shortness of breath.   alprazolam 2 MG tablet Commonly known as: XANAX Take 1 tablet (2 mg total) by mouth 4 (four) times daily as needed for sleep. What changed: reasons to take this   amLODipine-olmesartan 10-40 MG tablet Commonly known as: AZOR Take 1 tablet by mouth daily.   amoxicillin-clavulanate 875-125 MG tablet Commonly known as: AUGMENTIN Take 1 tablet by mouth every 12 (twelve) hours.   amphetamine-dextroamphetamine 10 MG tablet Commonly known as: ADDERALL Take 1 tablet (10 mg total) by mouth 2 (two) times daily with breakfast and lunch. What changed: when to take this   b complex vitamins tablet Take 1 tablet by mouth daily.   benzonatate 200 MG capsule Commonly known as: TESSALON Take 1 capsule (200 mg total) by mouth 3 (three) times daily as needed for cough.   cholestyramine 4  g packet Commonly known as: Questran Take 1 packet (4 g total) by mouth 3 (three) times daily with meals.   dicyclomine 20 MG tablet Commonly known as: Bentyl Take 1 tablet (20 mg total) by mouth 3 (three) times daily before meals.   diphenhydrAMINE 25 mg capsule Commonly known as: BENADRYL Take 25 mg by mouth every 6 (six) hours as needed (for seasonal allergies).   diphenoxylate-atropine 2.5-0.025 MG tablet Commonly known as: LOMOTIL TAKE 1 OR 2 TABLETS BY MOUTH 4 TIMES DAILY AS NEEDED FOR DIARRHEA OR LOOSE STOOLS MAX OF 8 TABLETS PER DAY What changed:   how much to take  how to take this  when to take this  reasons to take this  additional instructions   emtricitabine-tenofovir 200-300 MG tablet Commonly known as: Truvada Take 1 tablet by  mouth daily.   Flax Seeds Powd 2 (two) times daily as needed (as directed when mixing into health shakes).   glycopyrrolate 2 MG tablet Commonly known as: ROBINUL Take 1 tablet (2 mg total) by mouth 2 (two) times daily. What changed:   when to take this  reasons to take this   halobetasol 0.05 % ointment Commonly known as: ULTRAVATE Apply topically 2 (two) times daily. What changed:   how much to take  when to take this  reasons to take this   HYDROcodone-acetaminophen 10-325 MG tablet Commonly known as: NORCO Take 1 tablet by mouth every 6 (six) hours as needed. for pain   ipratropium 0.03 % nasal spray Commonly known as: Atrovent Place 2 sprays into both nostrils 2 (two) times daily. What changed:   when to take this  reasons to take this   loperamide 2 MG capsule Commonly known as: IMODIUM Take 2 mg by mouth as needed for diarrhea or loose stools.   methylPREDNISolone 4 MG Tbpk tablet Commonly known as: MEDROL DOSEPAK As directed   multivitamin capsule Take 1 capsule by mouth daily.   ondansetron 4 MG tablet Commonly known as: Zofran Take 1 tablet (4 mg total) by mouth every 8 (eight) hours as needed for nausea or vomiting. What changed: Another medication with the same name was added. Make sure you understand how and when to take each.   ondansetron 4 MG tablet Commonly known as: ZOFRAN Take 1 tablet (4 mg total) by mouth every 8 (eight) hours as needed for nausea or vomiting. What changed: You were already taking a medication with the same name, and this prescription was added. Make sure you understand how and when to take each.   PARoxetine 10 MG tablet Commonly known as: Paxil Take 1 tablet (10 mg total) by mouth daily.   saccharomyces boulardii 250 MG capsule Commonly known as: Florastor Take 1 capsule (250 mg total) by mouth 2 (two) times daily.   tadalafil 5 MG tablet Commonly known as: CIALIS TAKE 1 TABLET BY MOUTH ONCE DAILY     testosterone cypionate 200 MG/ML injection Commonly known as: DEPOTESTOSTERONE CYPIONATE INJECT 1 ML (200 MG) INTO THE MUSCLE EVERY 14 DAYS What changed: See the new instructions.   traMADol 50 MG tablet Commonly known as: ULTRAM Take 1 tablet (50 mg total) by mouth every 6 (six) hours as needed for moderate pain.   triamcinolone ointment 0.1 % Commonly known as: KENALOG Apply 1 application topically 2 (two) times daily. What changed:   when to take this  reasons to take this   Vitamin D3 50 MCG (2000 UT) Tabs Take 2,000 Units by mouth daily  with breakfast.   zinc gluconate 50 MG tablet Take 100 mg by mouth daily.        Follow-up Information    Andria Meuse, MD. Go on 08/21/2019.   Specialty: General Surgery Why: 9:45am. Please bring a copy of your photo ID and insurance card. You will need to arrive 30 minutes early for paperwork.  Contact information: [address] [place] [address] [phone]        Plotnikov, Georgina Quint, MD. Schedule an appointment as soon as possible for a visit in 1 day(s).   Specialty: Internal Medicine Why: For follow up on your home medications  Contact information: [address] [place] [address] [phone]           Signed: Leary Roca, [facility] 08/01/2019, 9:51 AM Please see Amion for pager number during day hours 7:00am-4:30pm

## 2019-08-01 NOTE — Discharge Instructions (Signed)
[facility]      [facility] °[phone] ° °OPEN ABDOMINAL SURGERY: POST OP INSTRUCTIONS ° °Always review your discharge instruction sheet given to you by the facility where your surgery was performed. ° °IF YOU HAVE DISABILITY OR FAMILY LEAVE FORMS, YOU MUST BRING THEM TO THE OFFICE FOR PROCESSING.  PLEASE DO NOT GIVE THEM TO YOUR DOCTOR. ° °1. A prescription for pain medication may be given to you upon discharge.  Take your pain medication as prescribed, if needed.  If narcotic pain medicine is not needed, then you may take acetaminophen (Tylenol) or ibuprofen (Advil) as needed. °2. Take your usually prescribed medications unless otherwise directed. °3. If you need a refill on your pain medication, please contact your pharmacy. They will contact our office to request authorization.  Prescriptions will not be filled after 5pm or on week-ends. °4. You should follow a light diet the first few days after arrival home, such as soup and crackers, pudding, etc.unless your doctor has advised otherwise. A high-fiber, low fat diet can be resumed as tolerated.   Be sure to include lots of fluids daily. Most patients will experience some swelling and bruising on the chest and neck area.  Ice packs will help.  Swelling and bruising can take several days to resolve °5. Most patients will experience some swelling and bruising in the area of the incision. Ice pack will help. Swelling and bruising can take several days to resolve..  °6. It is common to experience some constipation if taking pain medication after surgery.  Increasing fluid intake and taking a stool softener will usually help or prevent this problem from occurring.  A mild laxative (Milk of Magnesia or Miralax) should be taken according to package directions if there are no bowel movements after 48 hours. °7.  You may have steri-strips (small skin tapes) in place directly over the incision.  These strips should be left on the skin for 7-10 days.  If your  surgeon used skin glue on the incision, you may shower in 24 hours.  The glue will flake off over the next 2-3 weeks.  Any sutures or staples will be removed at the office during your follow-up visit. You may find that a light gauze bandage over your incision may keep your staples from being rubbed or pulled. You may shower and replace the bandage daily. °8. ACTIVITIES:  You may resume regular (light) daily activities beginning the next day--such as daily self-care, walking, climbing stairs--gradually increasing activities as tolerated.  You may have sexual intercourse when it is comfortable.  Refrain from any heavy lifting or straining until approved by your doctor. °a. You may drive when you no longer are taking prescription pain medication, you can comfortably wear a seatbelt, and you can safely maneuver your car and apply brakes °b. Return to Work: ___________________________________ °9. You should see your doctor in the office for a follow-up appointment approximately two weeks after your surgery.  Make sure that you call for this appointment within a day or two after you arrive home to insure a convenient appointment time. °OTHER INSTRUCTIONS:  °_____________________________________________________________ °_____________________________________________________________ ° °WHEN TO CALL YOUR DOCTOR: °1. Fever over 101.0 °2. Inability to urinate °3. Nausea and/or vomiting °4. Extreme swelling or bruising °5. Continued bleeding from incision. °6. Increased pain, redness, or drainage from the incision. °7. Difficulty swallowing or breathing °8. Muscle cramping or spasms. °9. Numbness or tingling in hands or feet or around lips. ° °The clinic staff is available to   answer your questions during regular business hours.  Please dont hesitate to call and ask to speak to one of the nurses if you have concerns.  For further questions, please visit [email or web address]  MIDLINE WOUND CARE: - midline dressing  to be changed twice daily - supplies: sterile saline, kerlix, scissors, ABD pads, tape  - remove dressing and all packing carefully, moistening with sterile saline as needed to avoid packing/internal dressing sticking to the wound. - clean edges of skin around the wound with water/gauze, making sure there is no tape debris or leakage left on skin that could cause skin irritation or breakdown. - dampen and clean kerlix with sterile saline and pack wound from wound base to skin level, making sure to take note of any possible areas of wound tracking, tunneling and packing appropriately. Wound can be packed loosely. Trim kerlix to size if a whole kerlix is not required. - cover wound with a dry ABD pad and secure with tape.  - write the date/time on the dry dressing/tape to better track when the last dressing change occurred. - apply any skin protectant/powder recommended by clinician to protect skin/skin folds. - change dressing as needed if leakage occurs, wound gets contaminated, or patient requests to shower. - patient may shower daily with wound open and following the shower the wound should be dried and a clean dressing placed.   Please take all of your antibiotics until finished!   You may develop abdominal discomfort or diarrhea from the antibiotic.  You may help offset this with probiotics which you can buy or get in yogurt. Do not eat or take the probiotics until 2 hours after your antibiotic. Do not take your medicine if develop an itchy rash, swelling in your mouth or lips, or difficulty breathing.

## 2019-08-03 ENCOUNTER — Telehealth: Payer: Self-pay | Admitting: *Deleted

## 2019-08-03 ENCOUNTER — Other Ambulatory Visit: Payer: Self-pay | Admitting: *Deleted

## 2019-08-03 NOTE — Patient Outreach (Signed)
[facility]) Care Management  08/03/2019  [name] 11-Jun-1966 [id number]   Transition of care telephone call  Referral received:07/25/19 Initial outreach: 08/03/19 Insurance: [place]  Initial unsuccessful telephone call to patient's preferred number in order to complete transition of care assessment; no answer, left HIPAA compliant voicemail message requesting return call.   Objective: Per the electronic medical record, Mr. Mark Mcgee was hospitalized at [facility]  from 10/27-11/5/20 with/for Perforated appendicitis, Laparoscopic assisted lleocecectomy .  Comorbidities include: Hypertension, Asthmatic bronchitis, BPH,Anxiety Disorder,IBS   He was discharged to home on 08/02/19  without the need for home health services or durable medical equipment per the discharge summary.   Plan: This RNCM will route unsuccessful outreach letter with [facility] pamphlet and 24 hour Nurse Advice Line Magnet to [facility] clinical pool to be mailed to patient's home address. This RNCM will attempt another outreach within 4 business days.   Joylene Draft, RN, [facility] Management Coordinator  [phone]- Mobile [phone]- Toll Free Main Office

## 2019-08-03 NOTE — Telephone Encounter (Signed)
Transition Care Management Follow-up Telephone Call  How have you been since you were released from the hospital? I have some soreness. My incision looks good at this time.    Do you understand why you were in the hospital? yes   Do you understand the discharge instrcutions? yes  Items Reviewed:  Medications reviewed: yes  Allergies reviewed: yes  Dietary changes reviewed: no  Referrals reviewed: yes   Functional Questionnaire:   Activities of Daily Living (ADLs):   He states they are independent in the following: ambulation, bathing and hygiene, feeding, continence, grooming, toileting and dressing States they require assistance with the following: none   Any transportation issues/concerns?: no   Any patient concerns? no   Confirmed importance and date/time of follow-up visits scheduled: yes,08/06/19 @ 0930   Confirmed with patient if condition begins to worsen call PCP or go to the ER.  Patient was given the Call-a-Nurse line [phone]: no

## 2019-08-06 ENCOUNTER — Ambulatory Visit (INDEPENDENT_AMBULATORY_CARE_PROVIDER_SITE_OTHER): Payer: 59 | Admitting: Internal Medicine

## 2019-08-06 ENCOUNTER — Encounter: Payer: Self-pay | Admitting: Internal Medicine

## 2019-08-06 DIAGNOSIS — R634 Abnormal weight loss: Secondary | ICD-10-CM | POA: Diagnosis not present

## 2019-08-06 DIAGNOSIS — R410 Disorientation, unspecified: Secondary | ICD-10-CM | POA: Diagnosis not present

## 2019-08-06 DIAGNOSIS — G8929 Other chronic pain: Secondary | ICD-10-CM

## 2019-08-06 DIAGNOSIS — K3532 Acute appendicitis with perforation and localized peritonitis, without abscess: Secondary | ICD-10-CM

## 2019-08-06 DIAGNOSIS — F410 Panic disorder [episodic paroxysmal anxiety] without agoraphobia: Secondary | ICD-10-CM | POA: Diagnosis not present

## 2019-08-06 DIAGNOSIS — R197 Diarrhea, unspecified: Secondary | ICD-10-CM | POA: Diagnosis not present

## 2019-08-06 DIAGNOSIS — M5440 Lumbago with sciatica, unspecified side: Secondary | ICD-10-CM

## 2019-08-06 MED ORDER — HYDROCODONE-ACETAMINOPHEN 10-325 MG PO TABS: 1.0000 | ORAL_TABLET | Freq: Four times a day (QID) | ORAL | 0 refills | Status: DC | PRN

## 2019-08-06 MED FILL — HYDROCODON-APAP 10-325: 10-325 | 30 days supply | Qty: 120 | Fill #0

## 2019-08-06 NOTE — Assessment & Plan Note (Signed)
due to post-op meds withdrawal (Xanax, Norco) - resolved

## 2019-08-06 NOTE — Assessment & Plan Note (Signed)
Khameron is changing open wound dressings F/u w/Surgery

## 2019-08-06 NOTE — Assessment & Plan Note (Signed)
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

## 2019-08-06 NOTE — Progress Notes (Signed)
Virtual Visit via Video Note  I connected with Mark Mcgee on 08/06/19 at  9:30 AM EST by a video enabled telemedicine application and verified that I am speaking with the correct person using two identifiers.   I discussed the limitations of evaluation and management by telemedicine and the availability of in person appointments. The patient expressed understanding and agreed to proceed.  History of Present Illness: We need to follow-up on hospital f/u - peritonitis, delirium  Per Hx:  " Admit date: 07/24/2019 Discharge date: 08/01/2019  Admitting Diagnosis: Acute appendicitis with perforation and peritonitis.  Discharge Diagnosis Perforated, gangrenous appendicitis     Patient Active Problem List   Diagnosis Date Noted  . Delirium 07/29/2019  . Benzodiazepine withdrawal with delirium (HCC) 07/29/2019  . Perforated appendicitis 07/24/2019  . Stress and adjustment reaction 03/01/2019  . Febrile illness 02/15/2019  . 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 Therapeutic drug monitoring 08/24/2017  . Right arm pain 06/15/2017  . Exposure to blood or body fluid 05/18/2017  . Arthralgia 07/02/2016  . IBS (irritable bowel syndrome) 03/02/2015  . Well adult exam 08/07/2014  . Chest pain, atypical 08/31/2012  . URI (upper respiratory infection) 05/22/2012  . Serologic abnormality 11/15/2011  . BPH (benign prostatic hyperplasia) 08/23/2011  . 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 LUMP OR MASS IN BREAST 11/04/2010  . RENAL COLIC 10/22/2010  . Prostatitis 11/10/2009  . ANEMIA-UNSPECIFIED 03/10/2009  . ABDOMINAL PAIN, LEFT LOWER QUADRANT 03/10/2009  . PANIC DISORDER 02/11/2009  . Incontinence of feces 02/11/2009  . OTHER GENITAL HERPES 08/26/2008  . OTHER SPECIFIED DISORDER OF SKIN 08/26/2008  . DYSURIA 08/16/2008  . LOW BACK PAIN 08/05/2008  . Asthmatic bronchitis 06/28/2008  . Chronic fatigue 04/16/2008  .  FLATULENCE 01/09/2008  . INSOMNIA, PERSISTENT 10/10/2007  . CHEST WALL PAIN 10/10/2007  . Diarrhea 10/10/2007  . Anxiety disorder 08/29/2007  . HYPERTENSION 08/29/2007  . Adjustment disorder with mixed anxiety and depressed mood 06/27/2007    Consultants Psychiatry   H&P:  Jerelle Virden Giordanois an 53 y.o.malewho is followed by Dr. Russella Dar with IBS has had constipation and lower abdominal pain since Friday. The pain grew in intensitiy and he presented to the [facility] ER where a CT scan revealed a perforated appendicitis with phlegmon and fluid (no definable abscess found). He is very uncomfortable and tender and after discussing management strategies he would like to proceed with lap/open appendectomy. Will make arrangements for that to occur per Dr. Dwain Sarna.   Procedures Dr. Cliffton Asters - Laparoscopic Assisted Ileocecectomy - 07/25/2019  Hospital Course:  Patient was admitted to the general surgery service on 07/24/2019. He was taken to the OR by Dr. Cliffton Asters on 10/28 where he was found to have perforated, gangrenous appendicitis and underwent a laparoscopic-assisted ileocecectomy.  A 19 French round Blake drain was left in place at time of procedure.  Patient tolerated procedure well and was transferred back to the floor.  Foley was discontinued on postop day 1.  He was left on bowel rest postop day 1 for expected postoperative ileus in the setting of perforated appendicitis with peritonitis.  He was started on clear liquids, postop day 2.  He developed emesis and had a NG tube placed later that night.  He also developed escalating left-sided abdominal pain for which a CT was obtained and showed fluid collections in the lower abdomen that were likely postoperative.  During hospitalization he was monitored with serial exams, vitals and  labs.  His exam improved each day and white blood cell count trended and normalized. On 11/1, per notes patient had episode where he cut his IV tubing in multiple  places, pulled his JP drain prior, was "talking to himself".  It appears the patient took his Xanax 4 times daily as needed for anxiety at home.  This was reordered.  Psychiatry was consulted and felt episode was likely secondary to post surgical delirium vs anxiolytic withdrawal syndrome. When I saw the patient the next day, he was appropriate and this had resolved. Patient began having bowel function and diet was advanced as tolerated. JP drain remained SS and was removed 11/4. On11/12/2018, the patients WBC has normalized, he was voiding well, tolerating diet (had chicken salad for dinner the night before without n/v), ambulating well, pain well controlled, vital signs stable, midline wound clean, having bowel function and felt stable for discharge home. Follow up arranged as below. He was provided a note for work. He reports he has help at home for midline wound dressing changes.   Physical Exam: Gen: Alert, NAD, pleasant Card: RRR Pulm: CTA b/l, normal rate and effort Abd: Soft,ND, appropriate post op tenderness that is generalized. Exam is quite improved from the previous exam I performed on 11/2. There is no point tenderness or peritoneal signs. Midline wound is packed with healthy granulation tissue at the base.+BS. Drain in LLQ in place with SS drainage Ext:No LE edema       Allergies as of 08/01/2019      Reactions   Contrast Media [iodinated Diagnostic Agents] Shortness Of Breath   Buspirone Hcl Other (See Comments)   Reaction not recalled by the patient   Divalproex Sodium Other (See Comments)   Severe headaches   Olanzapine-fluoxetine Hcl Other (See Comments)   Reaction not recalled by the patient   Piroxicam Other (See Comments)   Reaction not recalled by the patient   Wheat Bran Diarrhea, Other (See Comments)   "Tears up my stomach"   Ibuprofen Rash, Other (See Comments)   Spots appear on chest appear         Medication List    STOP taking these  medications   azithromycin 250 MG tablet Commonly known as: Zithromax Z-Pak   Eluxadoline 100 MG Tabs Commonly known as: Viberzi   ibuprofen 400 MG tablet Commonly known as: ADVIL   Vitamin D (Ergocalciferol) 1.25 MG (50000 UT) Caps capsule Commonly known as: DRISDOL     TAKE these medications   acetaminophen 325 MG tablet Commonly known as: TYLENOL Take 325-650 mg by mouth every 6 (six) hours as needed for mild pain or headache.   acyclovir 400 MG tablet Commonly known as: ZOVIRAX TAKE 1 TABLET BY MOUTH 4 TIMES DAILY What changed:   how much to take  how to take this  when to take this  reasons to take this  additional instructions   albuterol 108 (90 Base) MCG/ACT inhaler Commonly known as: VENTOLIN HFA Inhale 2 puffs into the lungs every 4 (four) hours as needed for wheezing or shortness of breath.   alprazolam 2 MG tablet Commonly known as: XANAX Take 1 tablet (2 mg total) by mouth 4 (four) times daily as needed for sleep. What changed: reasons to take this   amLODipine-olmesartan 10-40 MG tablet Commonly known as: AZOR Take 1 tablet by mouth daily.   amoxicillin-clavulanate 875-125 MG tablet Commonly known as: AUGMENTIN Take 1 tablet by mouth every 12 (twelve) hours.   amphetamine-dextroamphetamine 10  MG tablet Commonly known as: ADDERALL Take 1 tablet (10 mg total) by mouth 2 (two) times daily with breakfast and lunch. What changed: when to take this   b complex vitamins tablet Take 1 tablet by mouth daily.   benzonatate 200 MG capsule Commonly known as: TESSALON Take 1 capsule (200 mg total) by mouth 3 (three) times daily as needed for cough.   cholestyramine 4 g packet Commonly known as: Questran Take 1 packet (4 g total) by mouth 3 (three) times daily with meals.   dicyclomine 20 MG tablet Commonly known as: Bentyl Take 1 tablet (20 mg total) by mouth 3 (three) times daily before meals.   diphenhydrAMINE 25 mg  capsule Commonly known as: BENADRYL Take 25 mg by mouth every 6 (six) hours as needed (for seasonal allergies).   diphenoxylate-atropine 2.5-0.025 MG tablet Commonly known as: LOMOTIL TAKE 1 OR 2 TABLETS BY MOUTH 4 TIMES DAILY AS NEEDED FOR DIARRHEA OR LOOSE STOOLS MAX OF 8 TABLETS PER DAY What changed:   how much to take  how to take this  when to take this  reasons to take this  additional instructions   emtricitabine-tenofovir 200-300 MG tablet Commonly known as: Truvada Take 1 tablet by mouth daily.   Flax Seeds Powd 2 (two) times daily as needed (as directed when mixing into health shakes).   glycopyrrolate 2 MG tablet Commonly known as: ROBINUL Take 1 tablet (2 mg total) by mouth 2 (two) times daily. What changed:   when to take this  reasons to take this   halobetasol 0.05 % ointment Commonly known as: ULTRAVATE Apply topically 2 (two) times daily. What changed:   how much to take  when to take this  reasons to take this   HYDROcodone-acetaminophen 10-325 MG tablet Commonly known as: NORCO Take 1 tablet by mouth every 6 (six) hours as needed. for pain   ipratropium 0.03 % nasal spray Commonly known as: Atrovent Place 2 sprays into both nostrils 2 (two) times daily. What changed:   when to take this  reasons to take this   loperamide 2 MG capsule Commonly known as: IMODIUM Take 2 mg by mouth as needed for diarrhea or loose stools.   methylPREDNISolone 4 MG Tbpk tablet Commonly known as: MEDROL DOSEPAK As directed   multivitamin capsule Take 1 capsule by mouth daily.   ondansetron 4 MG tablet Commonly known as: Zofran Take 1 tablet (4 mg total) by mouth every 8 (eight) hours as needed for nausea or vomiting. What changed: Another medication with the same name was added. Make sure you understand how and when to take each.   ondansetron 4 MG tablet Commonly known as: ZOFRAN Take 1 tablet (4 mg total) by mouth every 8 (eight)  hours as needed for nausea or vomiting. What changed: You were already taking a medication with the same name, and this prescription was added. Make sure you understand how and when to take each.   PARoxetine 10 MG tablet Commonly known as: Paxil Take 1 tablet (10 mg total) by mouth daily.   saccharomyces boulardii 250 MG capsule Commonly known as: Florastor Take 1 capsule (250 mg total) by mouth 2 (two) times daily.   tadalafil 5 MG tablet Commonly known as: CIALIS TAKE 1 TABLET BY MOUTH ONCE DAILY   testosterone cypionate 200 MG/ML injection Commonly known as: DEPOTESTOSTERONE CYPIONATE INJECT 1 ML (200 MG) INTO THE MUSCLE EVERY 14 DAYS What changed: See the new instructions.   traMADol 50  MG tablet Commonly known as: ULTRAM Take 1 tablet (50 mg total) by mouth every 6 (six) hours as needed for moderate pain.   triamcinolone ointment 0.1 % Commonly known as: KENALOG Apply 1 application topically 2 (two) times daily. What changed:   when to take this  reasons to take this   Vitamin D3 50 MCG (2000 UT) Tabs Take 2,000 Units by mouth daily with breakfast.   zinc gluconate 50 MG tablet Take 100 mg by mouth daily.           Follow-up Information    Andria MeuseWhite, Christopher M, MD. Go on 08/21/2019.   Specialty: General Surgery Why: 9:45am. Please bring a copy of your photo ID and insurance card. You will need to arrive 30 minutes early for paperwork.  Contact information: [address] [place] [address] [phone]        Plotnikov, Georgina QuintAleksei V, MD. Schedule an appointment as soon as possible for a visit in 1 day(s).   Specialty: Internal Medicine Why: For follow up on your home medications  Contact information: [address] [place] [address] [phone]    "  There has been no runny nose, cough, chest pain, shortness of breath, abdominal pain,, constipation, arthralgias, skin rashes. Diarrhea every hour. Open abd wound    Observations/Objective: The patient appears to be in no acute distress, looks ok.  Assessment and Plan:  See my Assessment and Plan. Follow Up Instructions:    I discussed the assessment and treatment plan with the patient. The patient was provided an opportunity to ask questions and all were answered. The patient agreed with the plan and demonstrated an understanding of the instructions.   The patient was advised to call back or seek an in-person evaluation if the symptoms worsen or if the condition fails to improve as anticipated.  I provided face-to-face time during this encounter. We were at different locations.   Sonda PrimesAlex Plotnikov, MD

## 2019-08-06 NOTE — Assessment & Plan Note (Signed)
Chronic Norco prn  Potential benefits of a long term opioids use as well as potential risks (i.e. addiction risk, apnea etc) and complications (i.e. Somnolence, constipation and others) were explained to the patient and were aknowledged. 

## 2019-08-06 NOTE — Assessment & Plan Note (Signed)
No different from before Meds re-started

## 2019-08-06 NOTE — Assessment & Plan Note (Signed)
Post-op Eating better

## 2019-08-08 ENCOUNTER — Other Ambulatory Visit: Payer: Self-pay | Admitting: *Deleted

## 2019-08-08 NOTE — Patient Outreach (Signed)
[facility]) Care Management  08/08/2019  Mark Mcgee 05/23/1966 [id number]   Transition of care telephone call  Referral received: 07/25/19 Initial outreach:08/03/19 Insurance: [place]  2nd unsuccessful telephone call to patient's preferred number in order to complete transition of care assessment; no answer, left HIPAA compliant voicemail message requesting return call.   Objective: Per the electronic medical record, Mr. Bleier was hospitalized at [facility]  from 10/27-11/5/20 with/for Perforated appendicitis, Laparoscopic assisted lleocecectomy .  Comorbidities include: Hypertension, Asthmatic bronchitis, BPH,Anxiety Disorder,IBS   He was discharged to home on 08/02/19  without the need for home health services or durable medical equipment per the discharge summary. Noted patient has attended post discharge virtual visit with PCP on 08/06/19.   Plan: If no return call from patient , will plan 3rd call attempt to patient in the next 4 business days.   Joylene Draft, RN, [place] Management Coordinator  [phone]- Mobile [phone]- Toll Free Main Office

## 2019-08-13 ENCOUNTER — Encounter: Payer: Self-pay | Admitting: *Deleted

## 2019-08-13 ENCOUNTER — Other Ambulatory Visit: Payer: Self-pay | Admitting: *Deleted

## 2019-08-13 NOTE — Patient Outreach (Signed)
[name]) Care Management  08/13/2019  Mark Mcgee 1966/03/01 [id number]  Transition of care call/case closure   Referral received:07/25/19 Initial outreach: 08/03/19 Insurance: [place]   Subjective: Initial successful telephone call to patient's preferred number in order to complete transition of care assessment; 2 HIPAA identifiers verified. Explained purpose of call and completed transition of care assessment.  States that he is not feeling as well on today, he complains of pain around abdominal area , feels like his abdomen is distending, not passing gas, no bm in the last 2 days . He reports pain controlled using prn tylenol. He denies nausea, no vomiting. He reports eating pretty good regular foods. He reports continuing to change his dressing wet to dry saline twice daily, reports edges of wound pink. He monitors temperature and blood pressure daily and states no temperature elevation and blood pressure within usual limits.  He reports drinking plenty to fluids and reports that his urine is concentrated colored. 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  During conversation reports getting a call and thinks it is the surgeon office, ended our call.  Returned to patient , he discussed Psychologist, sport and exercise office recommendations to using Miralax , fleets enema ,  for constipation give it a day  and notifying office if gets worse.  Patien'st friend is assisting in his recovery.  He discussed chronic condition of hypertension it is controlled with current management and denies need for referral to [place] chronic care management program. He states that he does not have short disability,he is unsure about hospital indemnity plan , he is currently out on FMLA. He says he uses a Ambulance person.  He denies educational needs related to staying  safe during the COVID 19 pandemic.    [facility]for[facility], Laparoscopic assisted lleocecectomy .Comorbidities include: Hypertension, Asthmatic bronchitis, BPH,Anxiety Disorder,IBSHe was discharged to home on 11/5/20without the need for home health services or durable medical equipment per the discharge summary. Noted patient has attended post discharge virtual visit with PCP on 08/06/19.   Assessment:  Patient voices good understanding of all discharge instructions.  See transition of care flowsheet for assessment details.   Plan:  Reviewed hospital discharge diagnosis of Appendicitis with perforation  Laparoscopic assisted Ileocecetomy   and discharge treatment plan using hospital discharge instructions, assessing medication adherence, reviewing problems requiring provider notification, and discussing the importance of follow up with surgeon, primary care provider and/or specialists as directed. Reviewed [employer] announcements that all [place] members will receive the Healthy Lifestyle Premium rate in 2021 Will send to patient by [facility] email, Benefits contact information along with Hospital indemnity contact information.  No ongoing care management needs identified so will close case to [place] Management services and route successful outreach letter with [place] Management pamphlet and 24 Hour Nurse Line Magnet to [place] Management clinical pool to be mailed to patient's home address. Thanked patient for their services to [facility].  Joylene Draft, RN, [place] Management Coordinator  [phone]- Mobile [phone]- Toll Free Main Office

## 2019-09-06 ENCOUNTER — Ambulatory Visit: Payer: 59 | Admitting: Internal Medicine

## 2019-09-18 ENCOUNTER — Other Ambulatory Visit: Payer: Self-pay

## 2019-09-29 MED ORDER — AMPHETAMINE-DEXTROAMPHETAMINE 10 MG PO TABS: 10.0000 mg | ORAL_TABLET | Freq: Two times a day (BID) | ORAL | 0 refills | Status: DC

## 2019-09-29 MED ORDER — HYDROCODONE-ACETAMINOPHEN 10-325 MG PO TABS: 1.0000 | ORAL_TABLET | Freq: Four times a day (QID) | ORAL | 0 refills | Status: DC | PRN

## 2019-10-01 MED FILL — AMPHETAMINE-DEXTROAMPHETAMI: 10 | 30 days supply | Qty: 60 | Fill #0

## 2019-10-02 ENCOUNTER — Other Ambulatory Visit: Payer: Self-pay | Admitting: Internal Medicine

## 2019-10-02 MED FILL — ALPRAZolam 2 MG TABS: 2 | 30 days supply | Qty: 120 | Fill #0

## 2019-10-02 MED FILL — AMLODIPINE-OLMESARTAN 10-40: 10-40 | 90 days supply | Qty: 90 | Fill #1

## 2019-10-05 MED FILL — HYDROCODON-APAP 10-325: 10-325 | 30 days supply | Qty: 120 | Fill #0

## 2019-10-10 NOTE — Telephone Encounter (Signed)
Key: [id number]

## 2019-10-11 MED FILL — TADALAFIL 5 MG TABS: 5 | 90 days supply | Qty: 90 | Fill #0

## 2019-10-11 NOTE — Telephone Encounter (Signed)
The authorization is effective for a maximum of 12 fills from 10/08/2019 to 10/06/2020, as long as the member is enrolled in their current health plan. The request was approved as submitted. This request is approved for 1 tablet per day. A written notification letter will follow with additional details.

## 2019-11-01 DIAGNOSIS — H5213 Myopia, bilateral: Secondary | ICD-10-CM | POA: Diagnosis not present

## 2019-11-10 IMAGING — CT CT ABD-PELV W/ CM
2 of 5 series · 15 of 46 positions shown, 17 images · IV contrast (APPLIED)
Comparison: CT abdomen pelvis 03/29/2017

CLINICAL DATA: Right lower quadrant pain for 3 days, constipation

EXAM:
CT ABDOMEN AND PELVIS WITH CONTRAST
TECHNIQUE: Multidetector CT imaging of the abdomen and pelvis was performed
using the standard protocol following bolus administration of
intravenous contrast.
CONTRAST:  100mL OMNIPAQUE IOHEXOL 300 MG/ML  SOLN

[Series 3: abd/ pelvis 5.0 i30f 2 · axial · 0.88mm/px · z∈[+777,+1287]mm · 12 of 114 slices shown, 14 images]
[im 6/114  soft-tissue]
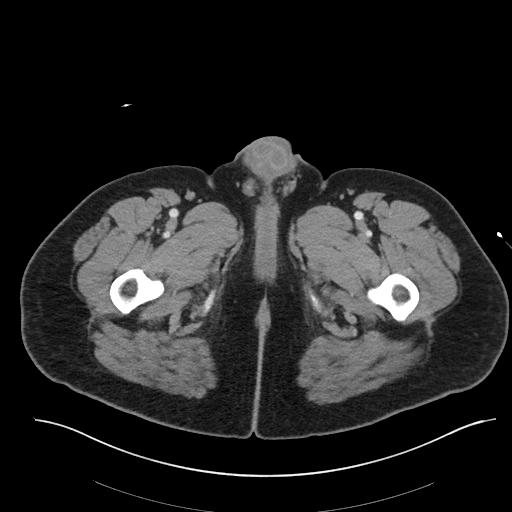
[im 6/114  bone]
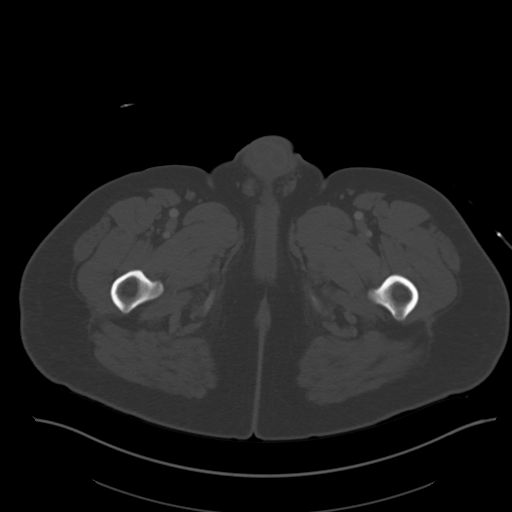
[im 17/114  soft-tissue]
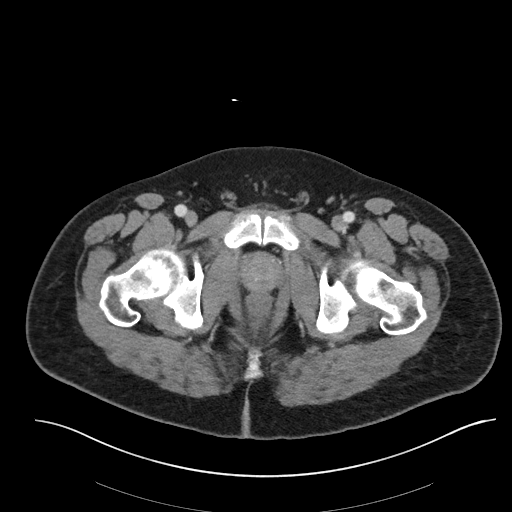
[im 23/114  soft-tissue]
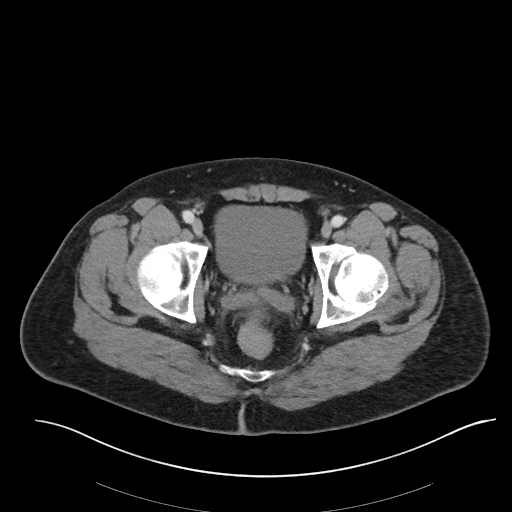
[im 34/114  soft-tissue]
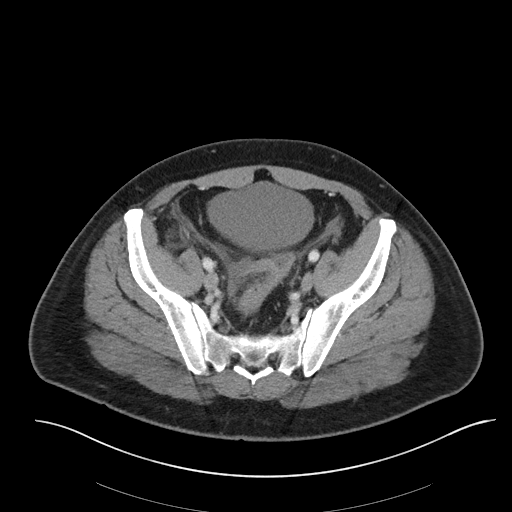
[im 46/114  soft-tissue]
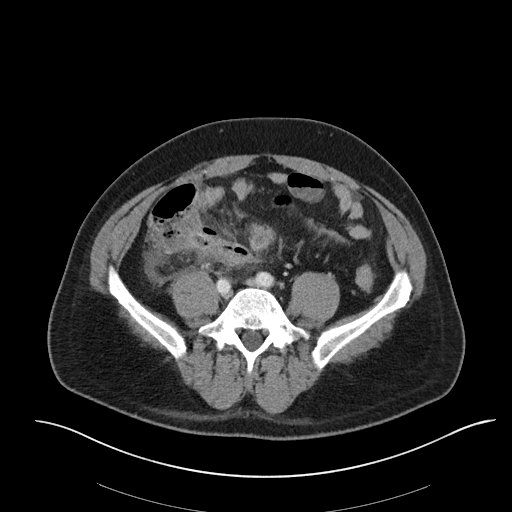
[im 51/114  soft-tissue]
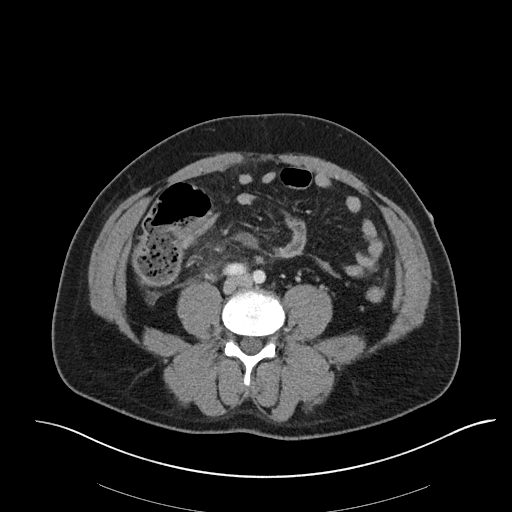
[im 63/114  soft-tissue]
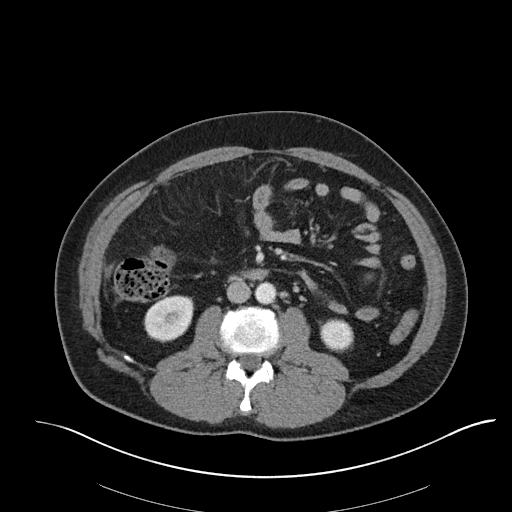
[im 68/114  soft-tissue]
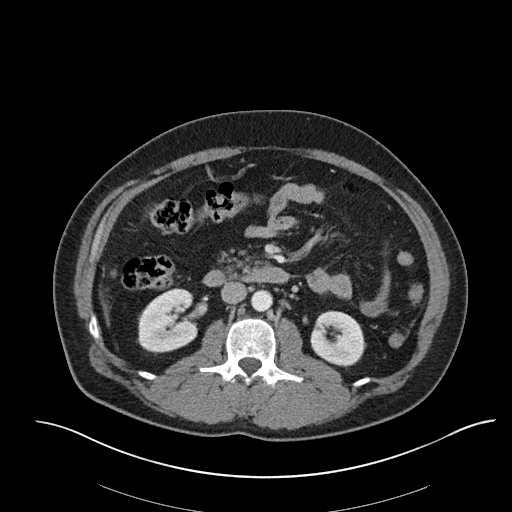
[im 80/114  soft-tissue]
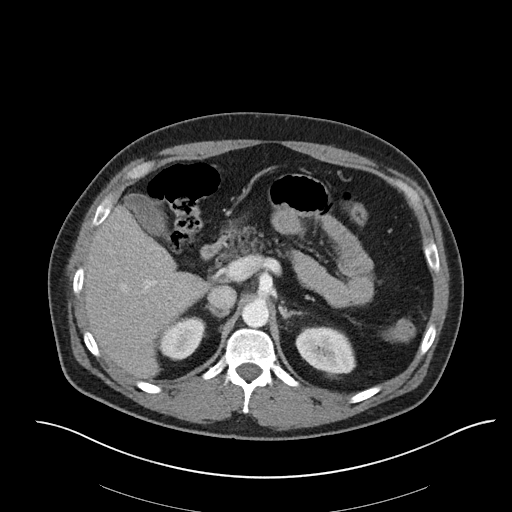
[im 80/114  bone]
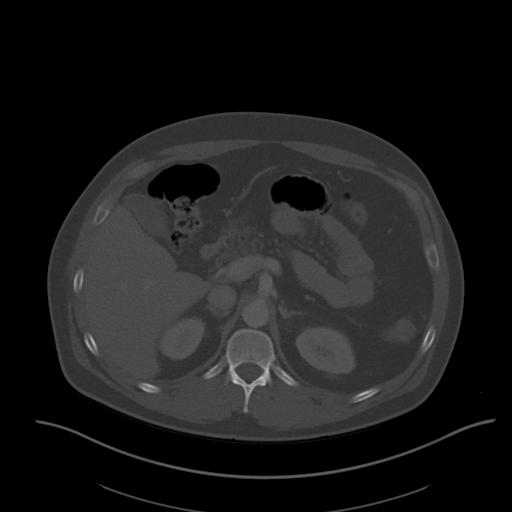
[im 91/114  soft-tissue]
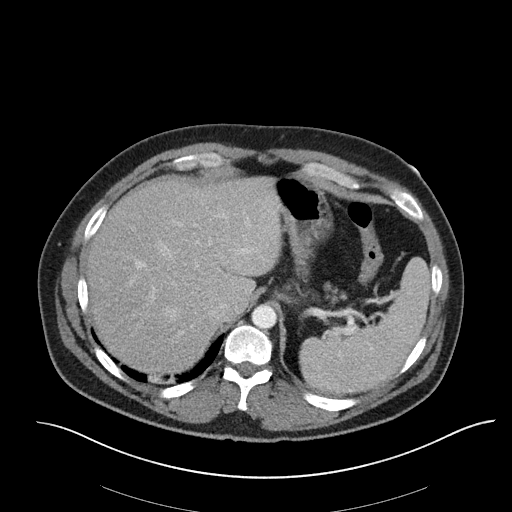
[im 97/114  soft-tissue]
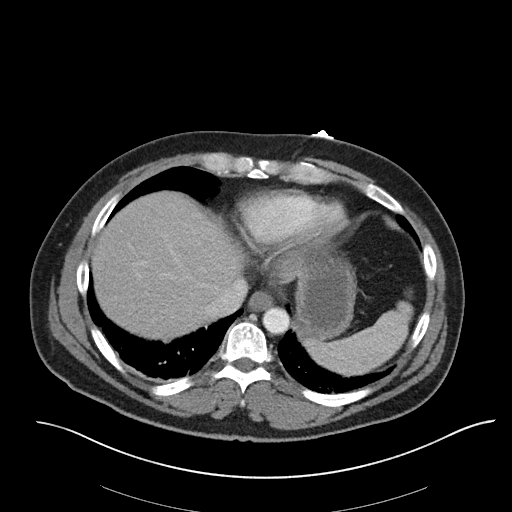
[im 108/114  soft-tissue]
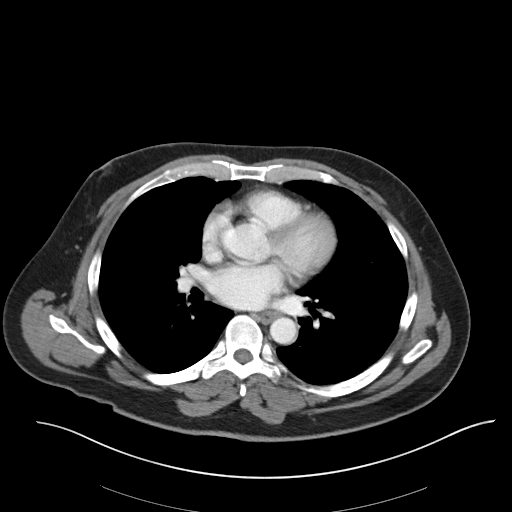

[Series 6: coronal soft tissue · coronal · 0.86mm/px · 3 of 113 slices shown]
[im 38/113  soft-tissue]
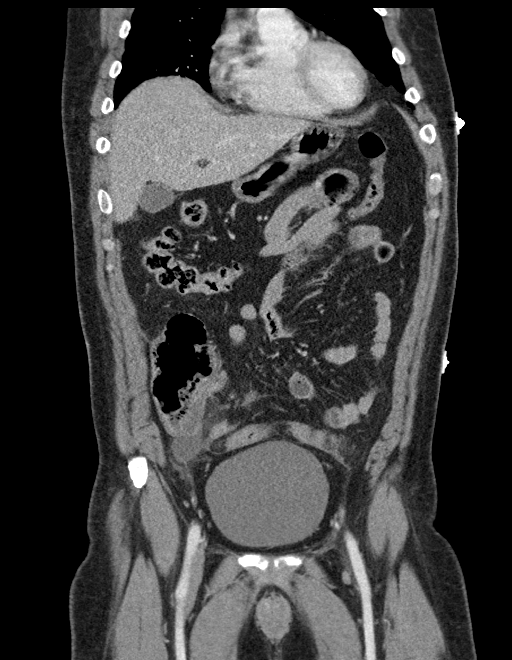
[im 50/113  soft-tissue]
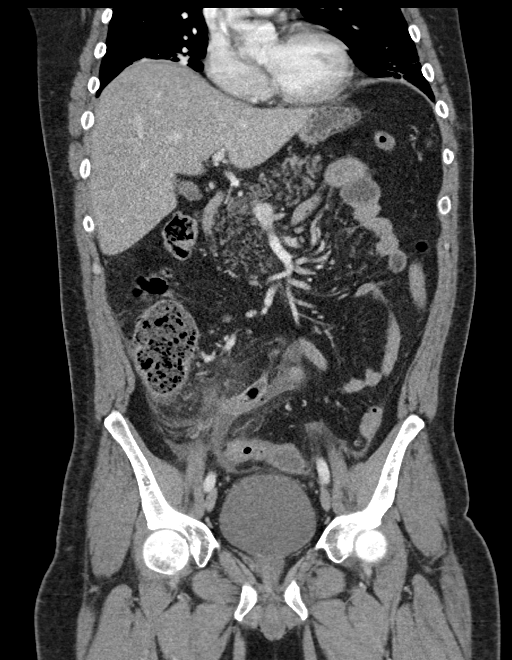
[im 63/113  soft-tissue]
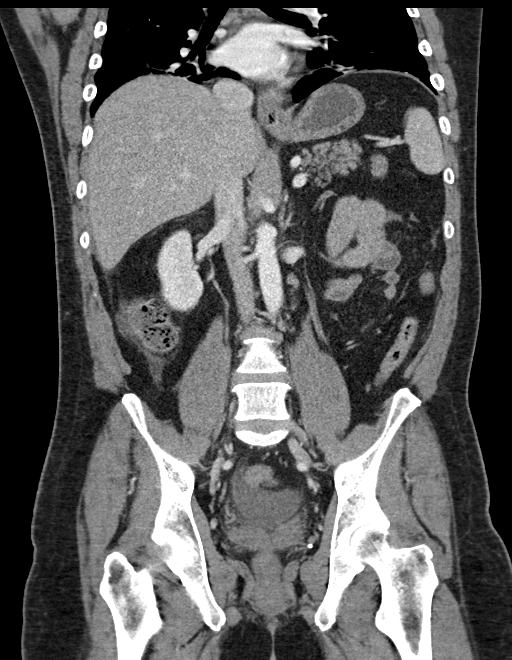

[15 of 46 positions shown; findings below may reference images not displayed]

FINDINGS: Lower chest: Basilar atelectatic changes. Lung bases otherwise
clear. Normal heart size. No pericardial effusion.

Hepatobiliary: No focal liver abnormality is seen. Prominent fold at
the neck of the gallbladder. No calcified gallstones,
pericholecystic inflammation or biliary ductal dilatation.

Pancreas: Partial fatty replacement of the pancreas. No
peripancreatic inflammation or ductal dilatation.

Spleen: Normal in size without focal abnormality.

Adrenals/Urinary Tract: Normal adrenal glands. 11 mm hypoattenuating
simple appearing fluid attenuation cyst in the interpolar right
kidney. Kidneys are otherwise unremarkable, without renal calculi,
suspicious lesion, or hydronephrosis. Bladder is unremarkable. No
extravasation of contrast is seen on excretory phase delayed
imaging.

Stomach/Bowel: Dilated air and fluid-filled appendix with peripheral
hyperemia measuring up to 2.3 cm in diameter. Small amount
extraluminal gas extending from the base of the appendix (3/70) as
well as surrounding fluid and phlegmonous change. No discrete or
organized abscess has formed at this time. The air is reactive
thickening of the cecal tip as well as the adjacent ileum which
courses in close proximity. No resulting obstruction in the inflamed
portions of the bowel. Distal esophagus, stomach and more proximal
small bowel are unremarkable. More distal colonic segments are free
of acute abnormality.

Vascular/Lymphatic: The aorta is normal caliber. Reactive adenopathy
in the right lower quadrant. No suspicious or enlarged lymph nodes
in the included lymphatic chains.

Reproductive: The prostate and seminal vesicles are unremarkable.

Other: There is extensive fluid and phlegmonous change centered upon
the inflamed appendix in the right lower quadrant. Additional
intermediate attenuation (18-25 HU) fluid is seen in the low pelvis
and left upper quadrant, likely redistributed. Additional foci of
anti dependent gas are noted along the anterior peritoneal surface.
Some peritoneal thickening is noted in the right and left lower
quadrants compatible with developing peritonitis.

Musculoskeletal: No acute osseous abnormality or suspicious osseous
lesion.
IMPRESSION: 1. Perforated appendicitis with extensive fluid and phlegmonous
change centered upon the inflamed appendix in the right lower
quadrant. No discrete or organized abscess has formed at this time.
Evidence of developing peritonitis.
2. Reactive thickening of the cecal tip as well as the adjacent
ileum which courses in close proximity. No resulting obstruction in
the inflamed portions of the bowel.

## 2019-11-19 DIAGNOSIS — I729 Aneurysm of unspecified site: Secondary | ICD-10-CM | POA: Diagnosis not present

## 2019-11-20 ENCOUNTER — Other Ambulatory Visit: Payer: Self-pay | Admitting: Internal Medicine

## 2019-11-20 MED FILL — ALPRAZolam 2 MG TABS: 2 | 30 days supply | Qty: 120 | Fill #1

## 2019-11-20 MED FILL — AMPHETAMINE-DEXTROAMPHETAMI: 10 | 30 days supply | Qty: 60 | Fill #0

## 2019-11-26 ENCOUNTER — Other Ambulatory Visit: Payer: Self-pay

## 2019-11-26 ENCOUNTER — Other Ambulatory Visit: Payer: Self-pay | Admitting: Internal Medicine

## 2019-11-29 MED FILL — HYDROCODON-APAP 10-325: 10-325 | 30 days supply | Qty: 120 | Fill #0

## 2019-12-04 ENCOUNTER — Encounter: Payer: Self-pay | Admitting: Internal Medicine

## 2019-12-04 ENCOUNTER — Ambulatory Visit (INDEPENDENT_AMBULATORY_CARE_PROVIDER_SITE_OTHER): Payer: 59 | Admitting: Internal Medicine

## 2019-12-04 ENCOUNTER — Other Ambulatory Visit: Payer: Self-pay

## 2019-12-04 VITALS — BP 128/84 | HR 82 | Temp 98.2°F | Ht 67.0 in | Wt 228.0 lb

## 2019-12-04 DIAGNOSIS — F418 Other specified anxiety disorders: Secondary | ICD-10-CM

## 2019-12-04 DIAGNOSIS — Z0000 Encounter for general adult medical examination without abnormal findings: Secondary | ICD-10-CM

## 2019-12-04 DIAGNOSIS — E291 Testicular hypofunction: Secondary | ICD-10-CM | POA: Diagnosis not present

## 2019-12-04 DIAGNOSIS — M5440 Lumbago with sciatica, unspecified side: Secondary | ICD-10-CM

## 2019-12-04 DIAGNOSIS — R894 Abnormal immunological findings in specimens from other organs, systems and tissues: Secondary | ICD-10-CM

## 2019-12-04 DIAGNOSIS — G8929 Other chronic pain: Secondary | ICD-10-CM | POA: Diagnosis not present

## 2019-12-04 DIAGNOSIS — I10 Essential (primary) hypertension: Secondary | ICD-10-CM

## 2019-12-04 DIAGNOSIS — K3532 Acute appendicitis with perforation and localized peritonitis, without abscess: Secondary | ICD-10-CM

## 2019-12-04 DIAGNOSIS — F4323 Adjustment disorder with mixed anxiety and depressed mood: Secondary | ICD-10-CM

## 2019-12-04 DIAGNOSIS — R634 Abnormal weight loss: Secondary | ICD-10-CM | POA: Diagnosis not present

## 2019-12-04 LAB — CBC WITH DIFFERENTIAL/PLATELET
Basophils Absolute: 0.1 10*3/uL (ref 0.0–0.1)
Basophils Relative: 1.1 % (ref 0.0–3.0)
Eosinophils Absolute: 0.2 10*3/uL (ref 0.0–0.7)
Eosinophils Relative: 3.7 % (ref 0.0–5.0)
HCT: 43.5 % (ref 39.0–52.0)
Hemoglobin: 14 g/dL (ref 13.0–17.0)
Lymphocytes Relative: 30.1 % (ref 12.0–46.0)
Lymphs Abs: 1.6 10*3/uL (ref 0.7–4.0)
MCHC: 32.1 g/dL (ref 30.0–36.0)
MCV: 77.4 fl — ABNORMAL LOW (ref 78.0–100.0)
Monocytes Absolute: 0.4 10*3/uL (ref 0.1–1.0)
Monocytes Relative: 7.9 % (ref 3.0–12.0)
Neutro Abs: 3.1 10*3/uL (ref 1.4–7.7)
Neutrophils Relative %: 57.2 % (ref 43.0–77.0)
Platelets: 240 10*3/uL (ref 150.0–400.0)
RBC: 5.62 Mil/uL (ref 4.22–5.81)
RDW: 14.3 % (ref 11.5–15.5)
WBC: 5.3 10*3/uL (ref 4.0–10.5)

## 2019-12-04 LAB — LIPID PANEL
Cholesterol: 170 mg/dL (ref 0–200)
HDL: 41 mg/dL (ref >39.00)
NonHDL: 128.65
Total CHOL/HDL Ratio: 4
Triglycerides: 212 mg/dL — ABNORMAL HIGH (ref 0.0–149.0)
VLDL: 42.4 mg/dL — ABNORMAL HIGH (ref 0.0–40.0)

## 2019-12-04 LAB — HEPATIC FUNCTION PANEL
ALT: 21 U/L (ref 0–53)
AST: 16 U/L (ref 0–37)
Albumin: 4.6 g/dL (ref 3.5–5.2)
Alkaline Phosphatase: 63 U/L (ref 39–117)
Bilirubin, Direct: 0 mg/dL (ref 0.0–0.3)
Total Bilirubin: 0.5 mg/dL (ref 0.2–1.2)
Total Protein: 7.4 g/dL (ref 6.0–8.3)

## 2019-12-04 LAB — TESTOSTERONE: Testosterone: 282.16 ng/dL — ABNORMAL LOW (ref 300.00–890.00)

## 2019-12-04 LAB — BASIC METABOLIC PANEL
BUN: 15 mg/dL (ref 6–23)
CO2: 28 mEq/L (ref 19–32)
Calcium: 9.3 mg/dL (ref 8.4–10.5)
Chloride: 101 mEq/L (ref 96–112)
Creatinine, Ser: 1.23 mg/dL (ref 0.40–1.50)
GFR: 61.63 mL/min (ref >60.00)
Glucose, Bld: 97 mg/dL (ref 70–99)
Potassium: 3.6 mEq/L (ref 3.5–5.1)
Sodium: 136 mEq/L (ref 135–145)

## 2019-12-04 LAB — PSA: PSA: 0.86 ng/mL (ref 0.10–4.00)

## 2019-12-04 LAB — TSH: TSH: 2.9 u[IU]/mL (ref 0.35–4.50)

## 2019-12-04 LAB — LDL CHOLESTEROL, DIRECT: Direct LDL: 91 mg/dL

## 2019-12-04 LAB — VITAMIN B12: Vitamin B-12: 315 pg/mL (ref 211–911)

## 2019-12-04 MED ORDER — AMPHETAMINE-DEXTROAMPHETAMINE 10 MG PO TABS: ORAL_TABLET | ORAL | 0 refills | Status: DC

## 2019-12-04 MED ORDER — ACYCLOVIR 400 MG PO TABS: 400.0000 mg | ORAL_TABLET | Freq: Four times a day (QID) | ORAL | 3 refills | Status: AC | PRN

## 2019-12-04 MED ORDER — HYDROCODONE-ACETAMINOPHEN 10-325 MG PO TABS: ORAL_TABLET | ORAL | 0 refills | Status: DC

## 2019-12-04 MED FILL — ACYCLOVIR 400 MG TABLET: 400 | 15 days supply | Qty: 60 | Fill #0

## 2019-12-04 NOTE — Assessment & Plan Note (Signed)
Xanax prn, Paxil 

## 2019-12-04 NOTE — Assessment & Plan Note (Addendum)
Xanax prn  Potential benefits of a long term benzodiazepines  use as well as potential risks  and complications were explained to the patient and were aknowledged. 

## 2019-12-04 NOTE — Assessment & Plan Note (Signed)
Norco prn  Potential benefits of a long term opioids use as well as potential risks (i.e. addiction risk, apnea etc) and complications (i.e. Somnolence, constipation and others) were explained to the patient and were aknowledged. 

## 2019-12-04 NOTE — Assessment & Plan Note (Signed)
Resolved

## 2019-12-04 NOTE — Assessment & Plan Note (Signed)
HIV RNA (-) 

## 2019-12-04 NOTE — Progress Notes (Signed)
Subjective:  Patient ID: Mark Mcgee, male    DOB: 09/07/1966  Age: 54 y.o. MRN: [id number]  CC: No chief complaint on file.   HPI Mark Mcgee presents for a well exam. Eating better C/o diarrhea -  bad F/u chronic pain  Outpatient Medications Prior to Visit  Medication Sig Dispense Refill  . acetaminophen (TYLENOL) 325 MG tablet Take 325-650 mg by mouth every 6 (six) hours as needed for mild pain or headache.     [name] acyclovir (ZOVIRAX) 400 MG tablet TAKE 1 TABLET BY MOUTH 4 TIMES DAILY (Patient taking differently: Take 400 mg by mouth 4 (four) times daily as needed (as directed for outbreaks). ) 120 tablet 1  . albuterol (VENTOLIN HFA) 108 (90 Base) MCG/ACT inhaler Inhale 2 puffs into the lungs every 4 (four) hours as needed for wheezing or shortness of breath. 18 g 5  . alprazolam (XANAX) 2 MG tablet TAKE 1 TABLET (2 MG TOTAL) BY MOUTH 4 (FOUR) TIMES DAILY AS NEEDED FOR SLEEP. 120 tablet 1  . amLODipine-olmesartan (AZOR) 10-40 MG tablet TAKE 1 TABLET BY MOUTH ONCE DAILY 90 tablet 3  . amphetamine-dextroamphetamine (ADDERALL) 10 MG tablet TAKE 1 TABLET BY MOUTH TWO TIMES DAILY WITH BREAKFAST AND LUNCH 60 tablet 0  . b complex vitamins tablet Take 1 tablet by mouth daily.      . Cholecalciferol (VITAMIN D3) 50 MCG (2000 UT) TABS Take 2,000 Units by mouth daily with breakfast.    . cholestyramine (QUESTRAN) 4 g packet Take 1 packet (4 g total) by mouth 3 (three) times daily with meals. 90 each 3  . dicyclomine (BENTYL) 20 MG tablet Take 1 tablet (20 mg total) by mouth 3 (three) times daily before meals. 90 tablet 1  . diphenhydrAMINE (BENADRYL) 25 mg capsule Take 25 mg by mouth every 6 (six) hours as needed (for seasonal allergies).    . diphenoxylate-atropine (LOMOTIL) 2.5-0.025 MG tablet TAKE 1 OR 2 TABLETS BY MOUTH 4 TIMES DAILY AS NEEDED FOR DIARRHEA OR LOOSE STOOLS MAX OF 8 TABLETS PER DAY (Patient taking differently: Take 1-2 tablets by mouth 4 (four) times daily as needed for  diarrhea or loose stools (MAX OF 8 TABLETS/24 HOURS). ) 60 tablet 3  . emtricitabine-tenofovir (TRUVADA) 200-300 MG tablet Take 1 tablet by mouth daily. 90 tablet 1  . Flaxseed, Linseed, (FLAX SEEDS) POWD 2 (two) times daily as needed (as directed when mixing into health shakes).     [name] glycopyrrolate (ROBINUL) 2 MG tablet Take 1 tablet (2 mg total) by mouth 2 (two) times daily. (Patient taking differently: Take 2 mg by mouth 2 (two) times daily as needed (spasms). ) 180 tablet 0  . halobetasol (ULTRAVATE) 0.05 % ointment Apply topically 2 (two) times daily. (Patient taking differently: Apply 1 application topically 2 (two) times daily as needed (for rashes). ) 50 g 3  . HYDROcodone-acetaminophen (NORCO) 10-325 MG tablet TAKE 1 TABLET BY MOUTH EVERY 6 HOURS AS NEEDED FOR PAIN **10/05/19** 120 tablet 0  . ipratropium (ATROVENT) 0.03 % nasal spray Place 2 sprays into both nostrils 2 (two) times daily. (Patient taking differently: Place 2 sprays into both nostrils 2 (two) times daily as needed (for seasonal allergies). ) 30 mL 5  . loperamide (IMODIUM) 2 MG capsule Take 2 mg by mouth as needed for diarrhea or loose stools.    . Multiple Vitamin (MULTIVITAMIN) capsule Take 1 capsule by mouth daily.     . ondansetron (ZOFRAN) 4 MG tablet Take  1 tablet (4 mg total) by mouth every 8 (eight) hours as needed for nausea or vomiting. 12 tablet 0  . PARoxetine (PAXIL) 10 MG tablet Take 1 tablet (10 mg total) by mouth daily. 90 tablet 1  . tadalafil (CIALIS) 5 MG tablet TAKE 1 TABLET BY MOUTH ONCE DAILY 90 tablet 3  . testosterone cypionate (DEPOTESTOSTERONE CYPIONATE) 200 MG/ML injection INJECT 1 ML (200 MG) INTO THE MUSCLE EVERY 14 DAYS (Patient taking differently: Inject 200 mg into the muscle every 14 (fourteen) days. ) 10 mL 5  . triamcinolone ointment (KENALOG) 0.1 % Apply 1 application topically 2 (two) times daily. (Patient taking differently: Apply 1 application topically 2 (two) times daily as needed (for  rashes). ) 80 g 2  . zinc gluconate 50 MG tablet Take 100 mg by mouth daily.    [name] amoxicillin-clavulanate (AUGMENTIN) 875-125 MG tablet Take 1 tablet by mouth every 12 (twelve) hours. 14 tablet 0  . benzonatate (TESSALON) 200 MG capsule Take 1 capsule (200 mg total) by mouth 3 (three) times daily as needed for cough. (Patient not taking: Reported on 07/24/2019) 60 capsule 0  . methylPREDNISolone (MEDROL DOSEPAK) 4 MG TBPK tablet As directed 21 tablet 0  . ondansetron (ZOFRAN) 4 MG tablet Take 1 tablet (4 mg total) by mouth every 8 (eight) hours as needed for nausea or vomiting. 30 tablet 0  . saccharomyces boulardii (FLORASTOR) 250 MG capsule Take 1 capsule (250 mg total) by mouth 2 (two) times daily. (Patient not taking: Reported on 07/24/2019) 60 capsule 3   No facility-administered medications prior to visit.    ROS: Review of Systems  Constitutional: Positive for fatigue and unexpected weight change. Negative for appetite change.  HENT: Negative for congestion, nosebleeds, sneezing, sore throat and trouble swallowing.   Eyes: Negative for itching and visual disturbance.  Respiratory: Negative for cough.   Cardiovascular: Negative for chest pain, palpitations and leg swelling.  Gastrointestinal: Positive for abdominal pain and diarrhea. Negative for abdominal distention, blood in stool and nausea.  Genitourinary: Positive for frequency. Negative for hematuria.  Musculoskeletal: Positive for arthralgias and back pain. Negative for gait problem, joint swelling and neck pain.  Skin: Negative for rash.  Neurological: Positive for weakness. Negative for dizziness, tremors and speech difficulty.  Psychiatric/Behavioral: Positive for decreased concentration, dysphoric mood and sleep disturbance. Negative for agitation and suicidal ideas. The patient is nervous/anxious.     Objective:  BP 128/84 (BP Location: Left Arm, Patient Position: Sitting, Cuff Size: Large)   Pulse 82   Temp 98.2 F  (36.8 C) (Oral)   Ht 5\' 7"  (1.702 m)   Wt 228 lb (103.4 kg)   SpO2 98%   BMI 35.71 kg/m   BP Readings from Last 3 Encounters:  12/04/19 128/84  08/01/19 (!) 127/92  11/24/17 108/64    Wt Readings from Last 3 Encounters:  12/04/19 228 lb (103.4 kg)  07/25/19 220 lb 10.9 oz (100.1 kg)  11/24/17 205 lb (93 kg)    Physical Exam Constitutional:      General: He is not in acute distress.    Appearance: He is well-developed.     Comments: NAD  Eyes:     Conjunctiva/sclera: Conjunctivae normal.     Pupils: Pupils are equal, round, and reactive to light.  Neck:     Thyroid: No thyromegaly.     Vascular: No JVD.  Cardiovascular:     Rate and Rhythm: Normal rate and regular rhythm.     Heart sounds:  Normal heart sounds. No murmur. No friction rub. No gallop.   Pulmonary:     Effort: Pulmonary effort is normal. No respiratory distress.     Breath sounds: Normal breath sounds. No wheezing or rales.  Chest:     Chest wall: No tenderness.  Abdominal:     General: Bowel sounds are normal. There is no distension.     Palpations: Abdomen is soft. There is no mass.     Tenderness: There is no abdominal tenderness. There is no guarding or rebound.  Musculoskeletal:        General: Tenderness present. Normal range of motion.     Cervical back: Normal range of motion.  Lymphadenopathy:     Cervical: No cervical adenopathy.  Skin:    General: Skin is warm and dry.     Findings: No rash.  Neurological:     Mental Status: He is alert and oriented to person, place, and time.     Cranial Nerves: No cranial nerve deficit.     Motor: No abnormal muscle tone.     Coordination: Coordination normal.     Gait: Gait normal.     Deep Tendon Reflexes: Reflexes are normal and symmetric.  Psychiatric:        Behavior: Behavior normal.        Thought Content: Thought content normal.        Judgment: Judgment normal.   LS - pain w/ROM Rectal - Lois had it recently at the hospital  Lab  Results  Component Value Date   WBC 10.3 08/01/2019   HGB 10.6 (L) 08/01/2019   HCT 31.6 (L) 08/01/2019   PLT 392 08/01/2019   GLUCOSE 119 (H) 08/01/2019   CHOL 192 11/28/2018   TRIG 165.0 (H) 11/28/2018   HDL 45.30 11/28/2018   LDLDIRECT 127.1 08/30/2012   LDLCALC 114 (H) 11/28/2018   ALT 21 07/24/2019   AST 15 07/24/2019   NA 139 08/01/2019   K 3.6 08/01/2019   CL 106 08/01/2019   CREATININE 1.18 08/01/2019   BUN 8 08/01/2019   CO2 25 08/01/2019   TSH 2.16 11/28/2018   PSA 0.47 11/28/2018    CT ABDOMEN PELVIS W CONTRAST  Result Date: 07/24/2019 CLINICAL DATA:  Right lower quadrant pain for 3 days, constipation EXAM: CT ABDOMEN AND PELVIS WITH CONTRAST TECHNIQUE: Multidetector CT imaging of the abdomen and pelvis was performed using the standard protocol following bolus administration of intravenous contrast. CONTRAST:  OMNIPAQUE IOHEXOL 300 MG/ML  SOLN COMPARISON:  CT abdomen pelvis 03/29/2017 FINDINGS: Lower chest: Basilar atelectatic changes. Lung bases otherwise clear. Normal heart size. No pericardial effusion. Hepatobiliary: No focal liver abnormality is seen. Prominent fold at the neck of the gallbladder. No calcified gallstones, pericholecystic inflammation or biliary ductal dilatation. Pancreas: Partial fatty replacement of the pancreas. No peripancreatic inflammation or ductal dilatation. Spleen: Normal in size without focal abnormality. Adrenals/Urinary Tract: Normal adrenal glands. 11 mm hypoattenuating simple appearing fluid attenuation cyst in the interpolar right kidney. Kidneys are otherwise unremarkable, without renal calculi, suspicious lesion, or hydronephrosis. Bladder is unremarkable. No extravasation of contrast is seen on excretory phase delayed imaging. Stomach/Bowel: Dilated air and fluid-filled appendix with peripheral hyperemia measuring up to 2.3 cm in diameter. Small amount extraluminal gas extending from the base of the appendix (3/70) as well as  surrounding fluid and phlegmonous change. No discrete or organized abscess has formed at this time. The air is reactive thickening of the cecal tip as well as the adjacent  ileum which courses in close proximity. No resulting obstruction in the inflamed portions of the bowel. Distal esophagus, stomach and more proximal small bowel are unremarkable. More distal colonic segments are free of acute abnormality. Vascular/Lymphatic: The aorta is normal caliber. Reactive adenopathy in the right lower quadrant. No suspicious or enlarged lymph nodes in the included lymphatic chains. Reproductive: The prostate and seminal vesicles are unremarkable. Other: There is extensive fluid and phlegmonous change centered upon the inflamed appendix in the right lower quadrant. Additional intermediate attenuation (18-25 HU) fluid is seen in the low pelvis and left upper quadrant, likely redistributed. Additional foci of anti dependent gas are noted along the anterior peritoneal surface. Some peritoneal thickening is noted in the right and left lower quadrants compatible with developing peritonitis. Musculoskeletal: No acute osseous abnormality or suspicious osseous lesion. IMPRESSION: 1. Perforated appendicitis with extensive fluid and phlegmonous change centered upon the inflamed appendix in the right lower quadrant. No discrete or organized abscess has formed at this time. Evidence of developing peritonitis. 2. Reactive thickening of the cecal tip as well as the adjacent ileum which courses in close proximity. No resulting obstruction in the inflamed portions of the bowel. Electronically Signed   By: Kreg Shropshire M.D.   On: 07/24/2019 19:39   DG Abd Portable 1 View  Result Date: 07/24/2019 CLINICAL DATA:  Abdomen pain EXAM: PORTABLE ABDOMEN - 1 VIEW COMPARISON:  CT abdomen pelvis March 29, 2017 FINDINGS: The bowel gas pattern is normal. No radio-opaque calculi or other significant radiographic abnormality are seen. IMPRESSION: No  bowel obstruction. Electronically Signed   By: Sherian Rein M.D.   On: 07/24/2019 18:42    Assessment & Plan:   There are no diagnoses linked to this encounter.   No orders of the defined types were placed in this encounter.    Follow-up: No follow-ups on file.  Sonda Primes, MD

## 2019-12-04 NOTE — Assessment & Plan Note (Signed)
  We discussed age appropriate health related issues, including available/recomended screening tests and vaccinations. We discussed a need for adhering to healthy diet and exercise. Labs were ordered to be later reviewed . All questions were answered. Colon 09/2018 A cardiac CT scan for calcium scoring offered

## 2019-12-04 NOTE — Assessment & Plan Note (Signed)
BP Readings from Last 3 Encounters:  12/04/19 128/84  08/01/19 (!) 127/92  11/24/17 108/64

## 2019-12-04 NOTE — Assessment & Plan Note (Signed)
Recovered  

## 2019-12-06 LAB — IRON,TIBC AND FERRITIN PANEL
%SAT: 32 % (calc) (ref 20–48)
Ferritin: 43 ng/mL (ref 38–380)
Iron: 104 ug/dL (ref 50–180)
TIBC: 323 mcg/dL (calc) (ref 250–425)

## 2019-12-06 LAB — HIV ANTIBODY (ROUTINE TESTING W REFLEX): HIV 1&2 Ab, 4th Generation: NONREACTIVE

## 2019-12-06 LAB — HIV-1 RNA QUANT-NO REFLEX-BLD

## 2020-01-06 DIAGNOSIS — Z20822 Contact with and (suspected) exposure to covid-19: Secondary | ICD-10-CM | POA: Diagnosis not present

## 2020-01-06 DIAGNOSIS — Z01812 Encounter for preprocedural laboratory examination: Secondary | ICD-10-CM | POA: Diagnosis not present

## 2020-01-09 DIAGNOSIS — Z9889 Other specified postprocedural states: Secondary | ICD-10-CM | POA: Diagnosis not present

## 2020-01-09 DIAGNOSIS — I671 Cerebral aneurysm, nonruptured: Secondary | ICD-10-CM | POA: Diagnosis not present

## 2020-01-09 DIAGNOSIS — F17290 Nicotine dependence, other tobacco product, uncomplicated: Secondary | ICD-10-CM | POA: Diagnosis not present

## 2020-01-09 DIAGNOSIS — J449 Chronic obstructive pulmonary disease, unspecified: Secondary | ICD-10-CM | POA: Diagnosis not present

## 2020-01-09 DIAGNOSIS — I2510 Atherosclerotic heart disease of native coronary artery without angina pectoris: Secondary | ICD-10-CM | POA: Diagnosis not present

## 2020-01-09 DIAGNOSIS — Z888 Allergy status to other drugs, medicaments and biological substances status: Secondary | ICD-10-CM | POA: Diagnosis not present

## 2020-01-09 DIAGNOSIS — F17210 Nicotine dependence, cigarettes, uncomplicated: Secondary | ICD-10-CM | POA: Diagnosis not present

## 2020-01-11 ENCOUNTER — Other Ambulatory Visit: Payer: Self-pay | Admitting: Internal Medicine

## 2020-01-11 MED FILL — AMPHETAMINE-DEXTROAMPHETAMI: 10 | 30 days supply | Qty: 60 | Fill #0

## 2020-01-11 MED FILL — HYDROCODON-APAP 10-325: 10-325 | 30 days supply | Qty: 120 | Fill #0

## 2020-01-11 NOTE — Telephone Encounter (Signed)
Check [place] registry last filled 11/20/2019. Per office policy 24-48 hr turn around time for refills. Will forward to MD desktop for approval once he return on Monday.Marland KitchenRaechel Chute

## 2020-01-12 MED FILL — ALPRAZOLAM 2 MG TABS: 2 | 30 days supply | Qty: 120 | Fill #0

## 2020-02-13 ENCOUNTER — Other Ambulatory Visit: Payer: Self-pay | Admitting: Internal Medicine

## 2020-02-13 MED FILL — ALPRAZOLAM 2 MG TABS: 2 | 30 days supply | Qty: 120 | Fill #1

## 2020-02-13 MED FILL — AMLODIPINE-OLMESARTAN 10-40: 10-40 | 90 days supply | Qty: 90 | Fill #0

## 2020-02-13 MED FILL — TADALAFIL 5 MG TABS: 5 | 90 days supply | Qty: 90 | Fill #1

## 2020-02-14 MED FILL — HYDROCODON-APAP 10-325: 10-325 | 30 days supply | Qty: 120 | Fill #0

## 2020-03-05 ENCOUNTER — Ambulatory Visit (INDEPENDENT_AMBULATORY_CARE_PROVIDER_SITE_OTHER): Payer: 59 | Admitting: Internal Medicine

## 2020-03-05 ENCOUNTER — Other Ambulatory Visit: Payer: Self-pay

## 2020-03-05 ENCOUNTER — Encounter: Payer: Self-pay | Admitting: Internal Medicine

## 2020-03-05 DIAGNOSIS — F988 Other specified behavioral and emotional disorders with onset usually occurring in childhood and adolescence: Secondary | ICD-10-CM

## 2020-03-05 DIAGNOSIS — G4700 Insomnia, unspecified: Secondary | ICD-10-CM | POA: Diagnosis not present

## 2020-03-05 DIAGNOSIS — R197 Diarrhea, unspecified: Secondary | ICD-10-CM

## 2020-03-05 DIAGNOSIS — R5382 Chronic fatigue, unspecified: Secondary | ICD-10-CM | POA: Diagnosis not present

## 2020-03-05 DIAGNOSIS — F418 Other specified anxiety disorders: Secondary | ICD-10-CM | POA: Diagnosis not present

## 2020-03-05 MED ORDER — "BD ECLIPSE SYRINGE 25G X 1"" 3 ML MISC": 5 refills | Status: DC

## 2020-03-05 MED ORDER — TESTOSTERONE CYPIONATE 200 MG/ML IM SOLN: INTRAMUSCULAR | 5 refills | Status: DC

## 2020-03-05 NOTE — Assessment & Plan Note (Signed)
Worse May need to re-start testosterone Labs

## 2020-03-05 NOTE — Addendum Note (Signed)
Addended by: Tresa Garter on: 03/05/2020 01:13 PM   Modules accepted: Orders

## 2020-03-05 NOTE — Assessment & Plan Note (Signed)
Xanax prn 

## 2020-03-05 NOTE — Progress Notes (Signed)
Subjective:  Patient ID: Mark Mcgee, male    DOB: [birth year]  Age: 54 y.o. MRN: [id number]  CC: No chief complaint on file.   HPI Mark Mcgee presents for diarrhea - bad, chronic pain, anxiety, depression f/u Diarrhea is bad - day/night - multiple. C/o somnolence, fatigue. Rihaan stopped testosterone shots 6 mo ago. Taking Norco at hs only  Outpatient Medications Prior to Visit  Medication Sig Dispense Refill  . acetaminophen (TYLENOL) 325 MG tablet Take 325-650 mg by mouth every 6 (six) hours as needed for mild pain or headache.     [name] acyclovir (ZOVIRAX) 400 MG tablet Take 1 tablet (400 mg total) by mouth 4 (four) times daily as needed (as directed for outbreaks). 60 tablet 3  . albuterol (VENTOLIN HFA) 108 (90 Base) MCG/ACT inhaler Inhale 2 puffs into the lungs every 4 (four) hours as needed for wheezing or shortness of breath. 18 g 5  . alprazolam (XANAX) 2 MG tablet TAKE 1 TABLET BY MOUTH FOUR TIMES DAILY AS NEEDED FOR SLEEP 120 tablet 1  . amLODipine-olmesartan (AZOR) 10-40 MG tablet TAKE 1 TABLET BY MOUTH ONCE DAILY 90 tablet 3  . amphetamine-dextroamphetamine (ADDERALL) 10 MG tablet TAKE 1 TABLET BY MOUTH TWO TIMES DAILY WITH BREAKFAST AND LUNCH 60 tablet 0  . b complex vitamins tablet Take 1 tablet by mouth daily.      . Cholecalciferol (VITAMIN D3) 50 MCG (2000 UT) TABS Take 2,000 Units by mouth daily with breakfast.    . cholestyramine (QUESTRAN) 4 g packet Take 1 packet (4 g total) by mouth 3 (three) times daily with meals. 90 each 3  . dicyclomine (BENTYL) 20 MG tablet Take 1 tablet (20 mg total) by mouth 3 (three) times daily before meals. 90 tablet 1  . diphenhydrAMINE (BENADRYL) 25 mg capsule Take 25 mg by mouth every 6 (six) hours as needed (for seasonal allergies).    . diphenoxylate-atropine (LOMOTIL) 2.5-0.025 MG tablet TAKE 1 OR 2 TABLETS BY MOUTH 4 TIMES DAILY AS NEEDED FOR DIARRHEA OR LOOSE STOOLS MAX OF 8 TABLETS PER DAY (Patient taking differently: Take 1-2  tablets by mouth 4 (four) times daily as needed for diarrhea or loose stools (MAX OF 8 TABLETS/24 HOURS). ) 60 tablet 3  . emtricitabine-tenofovir (TRUVADA) 200-300 MG tablet Take 1 tablet by mouth daily. 90 tablet 1  . Flaxseed, Linseed, (FLAX SEEDS) POWD 2 (two) times daily as needed (as directed when mixing into health shakes).     [name] glycopyrrolate (ROBINUL) 2 MG tablet Take 1 tablet (2 mg total) by mouth 2 (two) times daily. (Patient taking differently: Take 2 mg by mouth 2 (two) times daily as needed (spasms). ) 180 tablet 0  . halobetasol (ULTRAVATE) 0.05 % ointment Apply topically 2 (two) times daily. (Patient taking differently: Apply 1 application topically 2 (two) times daily as needed (for rashes). ) 50 g 3  . HYDROcodone-acetaminophen (NORCO) 10-325 MG tablet TAKE 1 TABLET BY MOUTH EVERY 6 HOURS AS NEEDED FOR PAIN 120 tablet 0  . ipratropium (ATROVENT) 0.03 % nasal spray Place 2 sprays into both nostrils 2 (two) times daily. (Patient taking differently: Place 2 sprays into both nostrils 2 (two) times daily as needed (for seasonal allergies). ) 30 mL 5  . loperamide (IMODIUM) 2 MG capsule Take 2 mg by mouth as needed for diarrhea or loose stools.    . Multiple Vitamin (MULTIVITAMIN) capsule Take 1 capsule by mouth daily.     . ondansetron (ZOFRAN) 4  MG tablet Take 1 tablet (4 mg total) by mouth every 8 (eight) hours as needed for nausea or vomiting. 12 tablet 0  . PARoxetine (PAXIL) 10 MG tablet Take 1 tablet (10 mg total) by mouth daily. 90 tablet 1  . tadalafil (CIALIS) 5 MG tablet TAKE 1 TABLET BY MOUTH ONCE DAILY 90 tablet 3  . testosterone cypionate (DEPOTESTOSTERONE CYPIONATE) 200 MG/ML injection INJECT 1 ML (200 MG) INTO THE MUSCLE EVERY 14 DAYS (Patient taking differently: Inject 200 mg into the muscle every 14 (fourteen) days. ) 10 mL 5  . triamcinolone ointment (KENALOG) 0.1 % Apply 1 application topically 2 (two) times daily. (Patient taking differently: Apply 1 application  topically 2 (two) times daily as needed (for rashes). ) 80 g 2  . zinc gluconate 50 MG tablet Take 100 mg by mouth daily.     No facility-administered medications prior to visit.    ROS: Review of Systems  Constitutional: Positive for fatigue. Negative for appetite change and unexpected weight change.  HENT: Negative for congestion, nosebleeds, sneezing, sore throat and trouble swallowing.   Eyes: Negative for itching and visual disturbance.  Respiratory: Negative for cough.   Cardiovascular: Negative for chest pain, palpitations and leg swelling.  Gastrointestinal: Negative for abdominal distention, blood in stool, diarrhea and nausea.  Genitourinary: Negative for frequency and hematuria.  Musculoskeletal: Positive for arthralgias. Negative for back pain, gait problem, joint swelling and neck pain.  Skin: Negative for rash.  Neurological: Negative for dizziness, tremors, speech difficulty and weakness.  Psychiatric/Behavioral: Positive for dysphoric mood and sleep disturbance. Negative for agitation. The patient is nervous/anxious.     Objective:  BP 118/66 (BP Location: Left Arm, Patient Position: Sitting, Cuff Size: Large)   Pulse 84   Temp 98.2 F (36.8 C) (Oral)   Ht 5\' 7"  (1.702 m)   Wt 225 lb (102.1 kg)   SpO2 97%   BMI 35.24 kg/m   BP Readings from Last 3 Encounters:  03/05/20 118/66  12/04/19 128/84  08/01/19 (!) 127/92    Wt Readings from Last 3 Encounters:  03/05/20 225 lb (102.1 kg)  12/04/19 228 lb (103.4 kg)  07/25/19 220 lb 10.9 oz (100.1 kg)    Physical Exam Constitutional:      General: He is not in acute distress.    Appearance: He is well-developed.     Comments: NAD  Eyes:     Conjunctiva/sclera: Conjunctivae normal.     Pupils: Pupils are equal, round, and reactive to light.  Neck:     Thyroid: No thyromegaly.     Vascular: No JVD.  Cardiovascular:     Rate and Rhythm: Normal rate and regular rhythm.     Heart sounds: Normal heart sounds.  No murmur. No friction rub. No gallop.   Pulmonary:     Effort: Pulmonary effort is normal. No respiratory distress.     Breath sounds: Normal breath sounds. No wheezing or rales.  Chest:     Chest wall: No tenderness.  Abdominal:     General: Bowel sounds are normal. There is no distension.     Palpations: Abdomen is soft. There is no mass.     Tenderness: There is no abdominal tenderness. There is no guarding or rebound.  Musculoskeletal:        General: No tenderness. Normal range of motion.     Cervical back: Normal range of motion.  Lymphadenopathy:     Cervical: No cervical adenopathy.  Skin:    General:  Skin is warm and dry.     Findings: No rash.  Neurological:     Mental Status: He is alert and oriented to person, place, and time.     Cranial Nerves: No cranial nerve deficit.     Motor: No abnormal muscle tone.     Coordination: Coordination normal.     Gait: Gait normal.     Deep Tendon Reflexes: Reflexes are normal and symmetric.  Psychiatric:        Behavior: Behavior normal.        Thought Content: Thought content normal.        Judgment: Judgment normal.     Lab Results  Component Value Date   WBC 5.3 12/04/2019   HGB 14.0 12/04/2019   HCT 43.5 12/04/2019   PLT 240.0 12/04/2019   GLUCOSE 97 12/04/2019   CHOL 170 12/04/2019   TRIG 212.0 (H) 12/04/2019   HDL 41.00 12/04/2019   LDLDIRECT 91.0 12/04/2019   LDLCALC 114 (H) 11/28/2018   ALT 21 12/04/2019   AST 16 12/04/2019   NA 136 12/04/2019   K 3.6 12/04/2019   CL 101 12/04/2019   CREATININE 1.23 12/04/2019   BUN 15 12/04/2019   CO2 28 12/04/2019   TSH 2.90 12/04/2019   PSA 0.86 12/04/2019    CT ABDOMEN PELVIS W CONTRAST  Result Date: 07/24/2019 CLINICAL DATA:  Right lower quadrant pain for 3 days, constipation EXAM: CT ABDOMEN AND PELVIS WITH CONTRAST TECHNIQUE: Multidetector CT imaging of the abdomen and pelvis was performed using the standard protocol following bolus administration of  intravenous contrast. CONTRAST:  OMNIPAQUE IOHEXOL 300 MG/ML  SOLN COMPARISON:  CT abdomen pelvis 03/29/2017 FINDINGS: Lower chest: Basilar atelectatic changes. Lung bases otherwise clear. Normal heart size. No pericardial effusion. Hepatobiliary: No focal liver abnormality is seen. Prominent fold at the neck of the gallbladder. No calcified gallstones, pericholecystic inflammation or biliary ductal dilatation. Pancreas: Partial fatty replacement of the pancreas. No peripancreatic inflammation or ductal dilatation. Spleen: Normal in size without focal abnormality. Adrenals/Urinary Tract: Normal adrenal glands. 11 mm hypoattenuating simple appearing fluid attenuation cyst in the interpolar right kidney. Kidneys are otherwise unremarkable, without renal calculi, suspicious lesion, or hydronephrosis. Bladder is unremarkable. No extravasation of contrast is seen on excretory phase delayed imaging. Stomach/Bowel: Dilated air and fluid-filled appendix with peripheral hyperemia measuring up to 2.3 cm in diameter. Small amount extraluminal gas extending from the base of the appendix (3/70) as well as surrounding fluid and phlegmonous change. No discrete or organized abscess has formed at this time. The air is reactive thickening of the cecal tip as well as the adjacent ileum which courses in close proximity. No resulting obstruction in the inflamed portions of the bowel. Distal esophagus, stomach and more proximal small bowel are unremarkable. More distal colonic segments are free of acute abnormality. Vascular/Lymphatic: The aorta is normal caliber. Reactive adenopathy in the right lower quadrant. No suspicious or enlarged lymph nodes in the included lymphatic chains. Reproductive: The prostate and seminal vesicles are unremarkable. Other: There is extensive fluid and phlegmonous change centered upon the inflamed appendix in the right lower quadrant. Additional intermediate attenuation (18-25 HU) fluid is seen in the  low pelvis and left upper quadrant, likely redistributed. Additional foci of anti dependent gas are noted along the anterior peritoneal surface. Some peritoneal thickening is noted in the right and left lower quadrants compatible with developing peritonitis. Musculoskeletal: No acute osseous abnormality or suspicious osseous lesion. IMPRESSION: 1. Perforated appendicitis with extensive fluid  and phlegmonous change centered upon the inflamed appendix in the right lower quadrant. No discrete or organized abscess has formed at this time. Evidence of developing peritonitis. 2. Reactive thickening of the cecal tip as well as the adjacent ileum which courses in close proximity. No resulting obstruction in the inflamed portions of the bowel. Electronically Signed   By: Kreg Shropshire M.D.   On: 07/24/2019 19:39   DG Abd Portable 1 View  Result Date: 07/24/2019 CLINICAL DATA:  Abdomen pain EXAM: PORTABLE ABDOMEN - 1 VIEW COMPARISON:  CT abdomen pelvis March 29, 2017 FINDINGS: The bowel gas pattern is normal. No radio-opaque calculi or other significant radiographic abnormality are seen. IMPRESSION: No bowel obstruction. Electronically Signed   By: Sherian Rein M.D.   On: 07/24/2019 18:42    Assessment & Plan:   There are no diagnoses linked to this encounter.   No orders of the defined types were placed in this encounter.    Follow-up: No follow-ups on file.  Sonda Primes, MD

## 2020-03-05 NOTE — Assessment & Plan Note (Signed)
Sleepy now

## 2020-03-05 NOTE — Assessment & Plan Note (Signed)
On Adderall 

## 2020-03-05 NOTE — Assessment & Plan Note (Signed)
Chronic, Xifaxan did not work Diet, Lomotil prn

## 2020-03-21 ENCOUNTER — Other Ambulatory Visit: Payer: Self-pay

## 2020-03-21 MED ORDER — AMPHETAMINE-DEXTROAMPHETAMINE 10 MG PO TABS: ORAL_TABLET | ORAL | 0 refills | Status: DC

## 2020-03-21 MED ORDER — HYDROCODONE-ACETAMINOPHEN 10-325 MG PO TABS: 1.0000 | ORAL_TABLET | Freq: Four times a day (QID) | ORAL | 0 refills | Status: DC | PRN

## 2020-03-21 MED FILL — AMPHETAMINE-DEXTROAMPHETAMI: 10 | 30 days supply | Qty: 60 | Fill #0

## 2020-03-21 MED FILL — HYDROCODON-APAP 10-325: 10-325 | 30 days supply | Qty: 120 | Fill #0

## 2020-03-21 NOTE — Telephone Encounter (Signed)
Done erx 

## 2020-03-24 ENCOUNTER — Other Ambulatory Visit: Payer: Self-pay | Admitting: Internal Medicine

## 2020-03-24 DIAGNOSIS — R52 Pain, unspecified: Secondary | ICD-10-CM

## 2020-03-24 NOTE — Telephone Encounter (Signed)
Needs UDS prior to this, call for him to come in and place order for this at lab.

## 2020-03-24 NOTE — Telephone Encounter (Signed)
Dr. Loren Racer pt  [place] Controlled Database Checked Last filled: 02/13/2020 120 LOV w/you: 03/05/2020 Next appt w/you: 06/05/2020

## 2020-03-26 NOTE — Telephone Encounter (Signed)
Left detailed message informing pt of below.  

## 2020-03-27 MED FILL — ALPRAZOLAM 2 MG TABS: 2 | 30 days supply | Qty: 120 | Fill #0

## 2020-04-14 NOTE — Telephone Encounter (Signed)
Forms have been completed & Placed in providers to review and sign.  Just waiting on patient to inform start date.

## 2020-04-18 DIAGNOSIS — Z0279 Encounter for issue of other medical certificate: Secondary | ICD-10-CM

## 2020-04-18 NOTE — Telephone Encounter (Signed)
Forms have been signed, Faxed to [employer], Copy sent to scan &Charged for.   Patient informed and original mailed to patient as requested.

## 2020-04-27 ENCOUNTER — Other Ambulatory Visit: Payer: Self-pay | Admitting: Internal Medicine

## 2020-04-28 NOTE — Telephone Encounter (Signed)
[place] Controlled Database Checked Last filled: Norco- 03/21/2020 (120) adderall- 03/21/20 (60) LOV w/you: 03/05/2020 Next appt w/you: 06/05/2020

## 2020-04-29 ENCOUNTER — Encounter: Payer: Self-pay | Admitting: Internal Medicine

## 2020-05-01 MED ORDER — AMPHETAMINE-DEXTROAMPHETAMINE 10 MG PO TABS: ORAL_TABLET | ORAL | 0 refills | Status: DC

## 2020-05-01 MED ORDER — HYDROCODONE-ACETAMINOPHEN 10-325 MG PO TABS: 1.0000 | ORAL_TABLET | Freq: Four times a day (QID) | ORAL | 0 refills | Status: DC | PRN

## 2020-05-01 MED FILL — HYDROCODON-APAP 10-325: 10-325 | 30 days supply | Qty: 120 | Fill #0

## 2020-05-01 MED FILL — AMPHETAMINE SALTS 10 MG: 10 | 30 days supply | Qty: 60 | Fill #0

## 2020-05-06 ENCOUNTER — Encounter: Payer: Self-pay | Admitting: Internal Medicine

## 2020-05-06 ENCOUNTER — Ambulatory Visit: Payer: 59 | Admitting: Internal Medicine

## 2020-05-06 ENCOUNTER — Other Ambulatory Visit: Payer: Self-pay

## 2020-05-06 DIAGNOSIS — R197 Diarrhea, unspecified: Secondary | ICD-10-CM

## 2020-05-06 DIAGNOSIS — F4323 Adjustment disorder with mixed anxiety and depressed mood: Secondary | ICD-10-CM | POA: Diagnosis not present

## 2020-05-06 DIAGNOSIS — R151 Fecal smearing: Secondary | ICD-10-CM | POA: Diagnosis not present

## 2020-05-06 LAB — SEDIMENTATION RATE: Sed Rate: 2 mm/h (ref 0–20)

## 2020-05-06 NOTE — Assessment & Plan Note (Signed)
Xanax prn, Paxil 

## 2020-05-06 NOTE — Assessment & Plan Note (Addendum)
Worse Discussed GI f/u - Dr Russella Dar We can refer to [facility] in [facility] Try Immune health mushroom blend

## 2020-05-06 NOTE — Assessment & Plan Note (Signed)
Worse 

## 2020-05-06 NOTE — Progress Notes (Signed)
Subjective:  Patient ID: Mark Mcgee, male    DOB: Dec 23, 1965  Age: 54 y.o. MRN: [id number]  CC: GI Problem (Also want to discuss getting covid vaccine)   HPI Mark Mcgee presents for worsening explosive diarrhea - multiple stools a day >6. Night - 2 stools. Sister had a terrible reaction to [facility] - seizure w/CNS after effects. Diet - rice  Outpatient Medications Prior to Visit  Medication Sig Dispense Refill  . acetaminophen (TYLENOL) 325 MG tablet Take 325-650 mg by mouth every 6 (six) hours as needed for mild pain or headache.     [name] acyclovir (ZOVIRAX) 400 MG tablet Take 1 tablet (400 mg total) by mouth 4 (four) times daily as needed (as directed for outbreaks). 60 tablet 3  . albuterol (VENTOLIN HFA) 108 (90 Base) MCG/ACT inhaler Inhale 2 puffs into the lungs every 4 (four) hours as needed for wheezing or shortness of breath. 18 g 5  . alprazolam (XANAX) 2 MG tablet TAKE 1 TABLET BY MOUTH FOUR TIMES DAILY AS NEEDED FOR SLEEP 120 tablet 1  . amLODipine-olmesartan (AZOR) 10-40 MG tablet TAKE 1 TABLET BY MOUTH ONCE DAILY 90 tablet 3  . amphetamine-dextroamphetamine (ADDERALL) 10 MG tablet TAKE 1 TABLET BY MOUTH TWO TIMES DAILY WITH BREAKFAST AND LUNCH 60 tablet 0  . b complex vitamins tablet Take 1 tablet by mouth daily.      . Cholecalciferol (VITAMIN D3) 50 MCG (2000 UT) TABS Take 2,000 Units by mouth daily with breakfast.    . diphenhydrAMINE (BENADRYL) 25 mg capsule Take 25 mg by mouth every 6 (six) hours as needed (for seasonal allergies).    . diphenoxylate-atropine (LOMOTIL) 2.5-0.025 MG tablet TAKE 1 OR 2 TABLETS BY MOUTH 4 TIMES DAILY AS NEEDED FOR DIARRHEA OR LOOSE STOOLS MAX OF 8 TABLETS PER DAY (Patient taking differently: Take 1-2 tablets by mouth 4 (four) times daily as needed for diarrhea or loose stools (MAX OF 8 TABLETS/24 HOURS). ) 60 tablet 3  . Flaxseed, Linseed, (FLAX SEEDS) POWD 2 (two) times daily as needed (as directed when mixing into health shakes).     [name]  glycopyrrolate (ROBINUL) 2 MG tablet Take 1 tablet (2 mg total) by mouth 2 (two) times daily. (Patient taking differently: Take 2 mg by mouth 2 (two) times daily as needed (spasms). ) 180 tablet 0  . halobetasol (ULTRAVATE) 0.05 % ointment Apply topically 2 (two) times daily. (Patient taking differently: Apply 1 application topically 2 (two) times daily as needed (for rashes). ) 50 g 3  . HYDROcodone-acetaminophen (NORCO) 10-325 MG tablet Take 1 tablet by mouth every 6 (six) hours as needed. for pain 120 tablet 0  . ipratropium (ATROVENT) 0.03 % nasal spray Place 2 sprays into both nostrils 2 (two) times daily. (Patient taking differently: Place 2 sprays into both nostrils 2 (two) times daily as needed (for seasonal allergies). ) 30 mL 5  . loperamide (IMODIUM) 2 MG capsule Take 2 mg by mouth as needed for diarrhea or loose stools.    . Multiple Vitamin (MULTIVITAMIN) capsule Take 1 capsule by mouth daily.     . ondansetron (ZOFRAN) 4 MG tablet Take 1 tablet (4 mg total) by mouth every 8 (eight) hours as needed for nausea or vomiting. 12 tablet 0  . SYRINGE-NEEDLE, DISP, 3 ML (BD ECLIPSE SYRINGE) 25G X 1" 3 ML MISC As directed for IM injections 50 each 5  . tadalafil (CIALIS) 5 MG tablet TAKE 1 TABLET BY MOUTH ONCE DAILY 90  tablet 3  . testosterone cypionate (DEPOTESTOSTERONE CYPIONATE) 200 MG/ML injection INJECT 1 ML (200 MG) INTO THE MUSCLE EVERY 14 DAYS 10 mL 5  . triamcinolone ointment (KENALOG) 0.1 % Apply 1 application topically 2 (two) times daily. (Patient taking differently: Apply 1 application topically 2 (two) times daily as needed (for rashes). ) 80 g 2  . zinc gluconate 50 MG tablet Take 100 mg by mouth daily.     No facility-administered medications prior to visit.    ROS: Review of Systems  Constitutional: Negative for appetite change, fatigue and unexpected weight change.  HENT: Negative for congestion, nosebleeds, sneezing, sore throat and trouble swallowing.   Eyes: Negative for  itching and visual disturbance.  Respiratory: Negative for cough.   Cardiovascular: Negative for chest pain, palpitations and leg swelling.  Gastrointestinal: Positive for diarrhea and rectal pain. Negative for abdominal distention, blood in stool and nausea.  Endocrine: Negative for polydipsia.  Genitourinary: Negative for frequency and hematuria.  Musculoskeletal: Positive for back pain. Negative for gait problem, joint swelling and neck pain.  Skin: Negative for rash.  Neurological: Negative for dizziness, tremors, speech difficulty and weakness.  Psychiatric/Behavioral: Positive for decreased concentration, dysphoric mood and sleep disturbance. Negative for agitation and suicidal ideas. The patient is nervous/anxious.     Objective:  BP 124/80 (BP Location: Left Arm)   Pulse 66   Temp 98.1 F (36.7 C) (Oral)   Wt 222 lb 9.6 oz (101 kg)   SpO2 98%   BMI 34.86 kg/m   BP Readings from Last 3 Encounters:  05/06/20 124/80  03/05/20 118/66  12/04/19 128/84    Wt Readings from Last 3 Encounters:  05/06/20 222 lb 9.6 oz (101 kg)  03/05/20 225 lb (102.1 kg)  12/04/19 228 lb (103.4 kg)    Physical Exam Constitutional:      General: He is not in acute distress.    Appearance: Normal appearance. He is well-developed. He is not toxic-appearing.     Comments: NAD  Eyes:     Conjunctiva/sclera: Conjunctivae normal.     Pupils: Pupils are equal, round, and reactive to light.  Neck:     Thyroid: No thyromegaly.     Vascular: No JVD.  Cardiovascular:     Rate and Rhythm: Normal rate and regular rhythm.     Heart sounds: Normal heart sounds. No murmur heard.  No friction rub. No gallop.   Pulmonary:     Effort: Pulmonary effort is normal. No respiratory distress.     Breath sounds: Normal breath sounds. No wheezing or rales.  Chest:     Chest wall: No tenderness.  Abdominal:     General: Bowel sounds are normal. There is no distension.     Palpations: Abdomen is soft. There  is no mass.     Tenderness: There is no abdominal tenderness. There is no guarding or rebound.  Musculoskeletal:        General: No tenderness. Normal range of motion.     Cervical back: Normal range of motion.  Lymphadenopathy:     Cervical: No cervical adenopathy.  Skin:    General: Skin is warm and dry.     Findings: No rash.  Neurological:     Mental Status: He is alert and oriented to person, place, and time.     Cranial Nerves: No cranial nerve deficit.     Motor: No abnormal muscle tone.     Coordination: Coordination normal.     Gait: Gait normal.  Deep Tendon Reflexes: Reflexes are normal and symmetric.  Psychiatric:        Behavior: Behavior normal.        Thought Content: Thought content normal.        Judgment: Judgment normal.   sad  Lab Results  Component Value Date   WBC 5.3 12/04/2019   HGB 14.0 12/04/2019   HCT 43.5 12/04/2019   PLT 240.0 12/04/2019   GLUCOSE 97 12/04/2019   CHOL 170 12/04/2019   TRIG 212.0 (H) 12/04/2019   HDL 41.00 12/04/2019   LDLDIRECT 91.0 12/04/2019   LDLCALC 114 (H) 11/28/2018   ALT 21 12/04/2019   AST 16 12/04/2019   NA 136 12/04/2019   K 3.6 12/04/2019   CL 101 12/04/2019   CREATININE 1.23 12/04/2019   BUN 15 12/04/2019   CO2 28 12/04/2019   TSH 2.90 12/04/2019   PSA 0.86 12/04/2019    CT ABDOMEN PELVIS W CONTRAST  Result Date: 07/24/2019 CLINICAL DATA:  Right lower quadrant pain for 3 days, constipation EXAM: CT ABDOMEN AND PELVIS WITH CONTRAST TECHNIQUE: Multidetector CT imaging of the abdomen and pelvis was performed using the standard protocol following bolus administration of intravenous contrast. CONTRAST:  OMNIPAQUE IOHEXOL 300 MG/ML  SOLN COMPARISON:  CT abdomen pelvis 03/29/2017 FINDINGS: Lower chest: Basilar atelectatic changes. Lung bases otherwise clear. Normal heart size. No pericardial effusion. Hepatobiliary: No focal liver abnormality is seen. Prominent fold at the neck of the gallbladder. No  calcified gallstones, pericholecystic inflammation or biliary ductal dilatation. Pancreas: Partial fatty replacement of the pancreas. No peripancreatic inflammation or ductal dilatation. Spleen: Normal in size without focal abnormality. Adrenals/Urinary Tract: Normal adrenal glands. 11 mm hypoattenuating simple appearing fluid attenuation cyst in the interpolar right kidney. Kidneys are otherwise unremarkable, without renal calculi, suspicious lesion, or hydronephrosis. Bladder is unremarkable. No extravasation of contrast is seen on excretory phase delayed imaging. Stomach/Bowel: Dilated air and fluid-filled appendix with peripheral hyperemia measuring up to 2.3 cm in diameter. Small amount extraluminal gas extending from the base of the appendix (3/70) as well as surrounding fluid and phlegmonous change. No discrete or organized abscess has formed at this time. The air is reactive thickening of the cecal tip as well as the adjacent ileum which courses in close proximity. No resulting obstruction in the inflamed portions of the bowel. Distal esophagus, stomach and more proximal small bowel are unremarkable. More distal colonic segments are free of acute abnormality. Vascular/Lymphatic: The aorta is normal caliber. Reactive adenopathy in the right lower quadrant. No suspicious or enlarged lymph nodes in the included lymphatic chains. Reproductive: The prostate and seminal vesicles are unremarkable. Other: There is extensive fluid and phlegmonous change centered upon the inflamed appendix in the right lower quadrant. Additional intermediate attenuation (18-25 HU) fluid is seen in the low pelvis and left upper quadrant, likely redistributed. Additional foci of anti dependent gas are noted along the anterior peritoneal surface. Some peritoneal thickening is noted in the right and left lower quadrants compatible with developing peritonitis. Musculoskeletal: No acute osseous abnormality or suspicious osseous lesion.  IMPRESSION: 1. Perforated appendicitis with extensive fluid and phlegmonous change centered upon the inflamed appendix in the right lower quadrant. No discrete or organized abscess has formed at this time. Evidence of developing peritonitis. 2. Reactive thickening of the cecal tip as well as the adjacent ileum which courses in close proximity. No resulting obstruction in the inflamed portions of the bowel. Electronically Signed   By: Kreg Shropshire M.D.   On:  07/24/2019 19:39   DG Abd Portable 1 View  Result Date: 07/24/2019 CLINICAL DATA:  Abdomen pain EXAM: PORTABLE ABDOMEN - 1 VIEW COMPARISON:  CT abdomen pelvis March 29, 2017 FINDINGS: The bowel gas pattern is normal. No radio-opaque calculi or other significant radiographic abnormality are seen. IMPRESSION: No bowel obstruction. Electronically Signed   By: Sherian Rein M.D.   On: 07/24/2019 18:42    Assessment & Plan:    Sonda Primes, MD

## 2020-05-06 NOTE — Patient Instructions (Signed)
Try Immune health mushroom blend

## 2020-05-08 LAB — COMPLETE METABOLIC PANEL WITH GFR
AG Ratio: 2.2 (calc) (ref 1.0–2.5)
ALT: 16 U/L (ref 9–46)
AST: 13 U/L (ref 10–35)
Albumin: 4.6 g/dL (ref 3.6–5.1)
Alkaline phosphatase (APISO): 49 U/L (ref 35–144)
BUN: 18 mg/dL (ref 7–25)
CO2: 25 mmol/L (ref 20–32)
Calcium: 9.2 mg/dL (ref 8.6–10.3)
Chloride: 106 mmol/L (ref 98–110)
Creat: 1.23 mg/dL (ref 0.70–1.33)
GFR, Est African American: 77 mL/min/{1.73_m2} (ref >=60)
GFR, Est Non African American: 67 mL/min/{1.73_m2} (ref >=60)
Globulin: 2.1 g/dL (calc) (ref 1.9–3.7)
Glucose, Bld: 100 mg/dL — ABNORMAL HIGH (ref 65–99)
Potassium: 4.9 mmol/L (ref 3.5–5.3)
Sodium: 141 mmol/L (ref 135–146)
Total Bilirubin: 0.4 mg/dL (ref 0.2–1.2)
Total Protein: 6.7 g/dL (ref 6.1–8.1)

## 2020-05-08 LAB — ZINC: Zinc: 60 ug/dL (ref 60–130)

## 2020-05-09 ENCOUNTER — Other Ambulatory Visit: Payer: Self-pay | Admitting: Internal Medicine

## 2020-05-09 MED ORDER — ZINC 50 MG PO CAPS: ORAL_CAPSULE | ORAL | 3 refills | Status: DC

## 2020-05-09 MED FILL — ALPRAZOLAM 2 MG TABS: 2 | 30 days supply | Qty: 120 | Fill #1

## 2020-05-09 MED FILL — TADALAFIL 5 MG TABS: 5 | 90 days supply | Qty: 90 | Fill #2

## 2020-06-05 ENCOUNTER — Ambulatory Visit: Payer: 59 | Admitting: Internal Medicine

## 2020-06-05 ENCOUNTER — Encounter: Payer: Self-pay | Admitting: Internal Medicine

## 2020-06-05 ENCOUNTER — Other Ambulatory Visit: Payer: Self-pay

## 2020-06-05 DIAGNOSIS — K580 Irritable bowel syndrome with diarrhea: Secondary | ICD-10-CM | POA: Diagnosis not present

## 2020-06-05 DIAGNOSIS — J4521 Mild intermittent asthma with (acute) exacerbation: Secondary | ICD-10-CM | POA: Diagnosis not present

## 2020-06-05 DIAGNOSIS — R0789 Other chest pain: Secondary | ICD-10-CM

## 2020-06-05 MED ORDER — METHYLPREDNISOLONE 4 MG PO TBPK
ORAL_TABLET | ORAL | 0 refills | Status: DC
Start: 2020-06-05 — End: 2020-12-04

## 2020-06-05 MED FILL — METHYLPREDNISOLONE 4 MG TBP: 4 | 6 days supply | Qty: 21 | Fill #0

## 2020-06-05 NOTE — Assessment & Plan Note (Signed)
Relapsing explosive diarrhea. [place] is unable to travel [employer], work accommodations needed

## 2020-06-05 NOTE — Assessment & Plan Note (Addendum)
Worse after his resent COVID vaccine Will watch Medrol pack if worse

## 2020-06-05 NOTE — Assessment & Plan Note (Signed)
Ventolin prn 

## 2020-06-05 NOTE — Telephone Encounter (Signed)
Forms have been printed and placed in providers box to review and sign.

## 2020-06-05 NOTE — Progress Notes (Signed)
Subjective:  Patient ID: Mark Mcgee, male    DOB: 04-Aug-1966  Age: 54 y.o. MRN: [id number]  CC: No chief complaint on file.   HPI Mark Mcgee presents for feeling weak and SOB after the first Pfizer vaccine C/o severe diarrhea    Outpatient Medications Prior to Visit  Medication Sig Dispense Refill  . acetaminophen (TYLENOL) 325 MG tablet Take 325-650 mg by mouth every 6 (six) hours as needed for mild pain or headache.     [name] acyclovir (ZOVIRAX) 400 MG tablet Take 1 tablet (400 mg total) by mouth 4 (four) times daily as needed (as directed for outbreaks). 60 tablet 3  . albuterol (VENTOLIN HFA) 108 (90 Base) MCG/ACT inhaler Inhale 2 puffs into the lungs every 4 (four) hours as needed for wheezing or shortness of breath. 18 g 5  . alprazolam (XANAX) 2 MG tablet TAKE 1 TABLET BY MOUTH FOUR TIMES DAILY AS NEEDED FOR SLEEP 120 tablet 1  . amLODipine-olmesartan (AZOR) 10-40 MG tablet TAKE 1 TABLET BY MOUTH ONCE DAILY 90 tablet 3  . amphetamine-dextroamphetamine (ADDERALL) 10 MG tablet TAKE 1 TABLET BY MOUTH TWO TIMES DAILY WITH BREAKFAST AND LUNCH 60 tablet 0  . b complex vitamins tablet Take 1 tablet by mouth daily.      . Cholecalciferol (VITAMIN D3) 50 MCG (2000 UT) TABS Take 2,000 Units by mouth daily with breakfast.    . diphenhydrAMINE (BENADRYL) 25 mg capsule Take 25 mg by mouth every 6 (six) hours as needed (for seasonal allergies).    . diphenoxylate-atropine (LOMOTIL) 2.5-0.025 MG tablet TAKE 1 OR 2 TABLETS BY MOUTH 4 TIMES DAILY AS NEEDED FOR DIARRHEA OR LOOSE STOOLS MAX OF 8 TABLETS PER DAY (Patient taking differently: Take 1-2 tablets by mouth 4 (four) times daily as needed for diarrhea or loose stools (MAX OF 8 TABLETS/24 HOURS). ) 60 tablet 3  . Flaxseed, Linseed, (FLAX SEEDS) POWD 2 (two) times daily as needed (as directed when mixing into health shakes).     [name] glycopyrrolate (ROBINUL) 2 MG tablet Take 1 tablet (2 mg total) by mouth 2 (two) times daily. (Patient taking  differently: Take 2 mg by mouth 2 (two) times daily as needed (spasms). ) 180 tablet 0  . halobetasol (ULTRAVATE) 0.05 % ointment Apply topically 2 (two) times daily. (Patient taking differently: Apply 1 application topically 2 (two) times daily as needed (for rashes). ) 50 g 3  . HYDROcodone-acetaminophen (NORCO) 10-325 MG tablet Take 1 tablet by mouth every 6 (six) hours as needed. for pain 120 tablet 0  . ipratropium (ATROVENT) 0.03 % nasal spray Place 2 sprays into both nostrils 2 (two) times daily. (Patient taking differently: Place 2 sprays into both nostrils 2 (two) times daily as needed (for seasonal allergies). ) 30 mL 5  . loperamide (IMODIUM) 2 MG capsule Take 2 mg by mouth as needed for diarrhea or loose stools.    . Multiple Vitamin (MULTIVITAMIN) capsule Take 1 capsule by mouth daily.     . ondansetron (ZOFRAN) 4 MG tablet Take 1 tablet (4 mg total) by mouth every 8 (eight) hours as needed for nausea or vomiting. 12 tablet 0  . SYRINGE-NEEDLE, DISP, 3 ML (BD ECLIPSE SYRINGE) 25G X 1" 3 ML MISC As directed for IM injections 50 each 5  . tadalafil (CIALIS) 5 MG tablet TAKE 1 TABLET BY MOUTH ONCE DAILY 90 tablet 3  . testosterone cypionate (DEPOTESTOSTERONE CYPIONATE) 200 MG/ML injection INJECT 1 ML (200 MG) INTO THE  MUSCLE EVERY 14 DAYS 10 mL 5  . triamcinolone ointment (KENALOG) 0.1 % Apply 1 application topically 2 (two) times daily. (Patient taking differently: Apply 1 application topically 2 (two) times daily as needed (for rashes). ) 80 g 2  . Zinc 50 MG CAPS 1 cap qd 100 capsule 3  . zinc gluconate 50 MG tablet Take 100 mg by mouth daily.     No facility-administered medications prior to visit.    ROS: Review of Systems  Constitutional: Negative for appetite change, fatigue and unexpected weight change.  HENT: Negative for congestion, nosebleeds, sneezing, sore throat and trouble swallowing.   Eyes: Negative for itching and visual disturbance.  Respiratory: Positive for chest  tightness. Negative for cough.   Cardiovascular: Positive for chest pain. Negative for palpitations and leg swelling.  Gastrointestinal: Positive for diarrhea. Negative for abdominal distention, blood in stool and nausea.  Genitourinary: Negative for frequency and hematuria.  Musculoskeletal: Positive for back pain. Negative for gait problem, joint swelling and neck pain.  Skin: Negative for rash.  Neurological: Negative for dizziness, tremors, speech difficulty and weakness.  Psychiatric/Behavioral: Negative for agitation, dysphoric mood, sleep disturbance and suicidal ideas. The patient is not nervous/anxious.     Objective:  BP 128/90 (BP Location: Right Arm, Patient Position: Sitting, Cuff Size: Large)   Pulse 63   Temp 98.2 F (36.8 C) (Oral)   Ht 5\' 7"  (1.702 m)   Wt 225 lb (102.1 kg)   SpO2 98%   BMI 35.24 kg/m   BP Readings from Last 3 Encounters:  06/05/20 128/90  05/06/20 124/80  03/05/20 118/66    Wt Readings from Last 3 Encounters:  06/05/20 225 lb (102.1 kg)  05/06/20 222 lb 9.6 oz (101 kg)  03/05/20 225 lb (102.1 kg)    Physical Exam Constitutional:      General: He is not in acute distress.    Appearance: He is well-developed.     Comments: NAD  Eyes:     Conjunctiva/sclera: Conjunctivae normal.     Pupils: Pupils are equal, round, and reactive to light.  Neck:     Thyroid: No thyromegaly.     Vascular: No JVD.  Cardiovascular:     Rate and Rhythm: Normal rate and regular rhythm.     Heart sounds: Normal heart sounds. No murmur heard.  No friction rub. No gallop.   Pulmonary:     Effort: Pulmonary effort is normal. No respiratory distress.     Breath sounds: Normal breath sounds. No wheezing or rales.  Chest:     Chest wall: No tenderness.  Abdominal:     General: Bowel sounds are normal. There is no distension.     Palpations: Abdomen is soft. There is no mass.     Tenderness: There is no abdominal tenderness. There is no guarding or rebound.    Musculoskeletal:        General: Tenderness present. Normal range of motion.     Cervical back: Normal range of motion.  Lymphadenopathy:     Cervical: No cervical adenopathy.  Skin:    General: Skin is warm and dry.     Findings: No rash.  Neurological:     Mental Status: He is alert and oriented to person, place, and time.     Cranial Nerves: No cranial nerve deficit.     Motor: No abnormal muscle tone.     Coordination: Coordination normal.     Gait: Gait normal.     Deep Tendon  Reflexes: Reflexes are normal and symmetric.  Psychiatric:        Behavior: Behavior normal.        Thought Content: Thought content normal.        Judgment: Judgment normal.    Mark Mcgee looks tired   Lab Results  Component Value Date   WBC 5.3 12/04/2019   HGB 14.0 12/04/2019   HCT 43.5 12/04/2019   PLT 240.0 12/04/2019   GLUCOSE 100 (H) 05/06/2020   CHOL 170 12/04/2019   TRIG 212.0 (H) 12/04/2019   HDL 41.00 12/04/2019   LDLDIRECT 91.0 12/04/2019   LDLCALC 114 (H) 11/28/2018   ALT 16 05/06/2020   AST 13 05/06/2020   NA 141 05/06/2020   K 4.9 05/06/2020   CL 106 05/06/2020   CREATININE 1.23 05/06/2020   BUN 18 05/06/2020   CO2 25 05/06/2020   TSH 2.90 12/04/2019   PSA 0.86 12/04/2019    CT ABDOMEN PELVIS W CONTRAST  Result Date: 07/24/2019 CLINICAL DATA:  Right lower quadrant pain for 3 days, constipation EXAM: CT ABDOMEN AND PELVIS WITH CONTRAST TECHNIQUE: Multidetector CT imaging of the abdomen and pelvis was performed using the standard protocol following bolus administration of intravenous contrast. CONTRAST:  OMNIPAQUE IOHEXOL 300 MG/ML  SOLN COMPARISON:  CT abdomen pelvis 03/29/2017 FINDINGS: Lower chest: Basilar atelectatic changes. Lung bases otherwise clear. Normal heart size. No pericardial effusion. Hepatobiliary: No focal liver abnormality is seen. Prominent fold at the neck of the gallbladder. No calcified gallstones, pericholecystic inflammation or biliary ductal  dilatation. Pancreas: Partial fatty replacement of the pancreas. No peripancreatic inflammation or ductal dilatation. Spleen: Normal in size without focal abnormality. Adrenals/Urinary Tract: Normal adrenal glands. 11 mm hypoattenuating simple appearing fluid attenuation cyst in the interpolar right kidney. Kidneys are otherwise unremarkable, without renal calculi, suspicious lesion, or hydronephrosis. Bladder is unremarkable. No extravasation of contrast is seen on excretory phase delayed imaging. Stomach/Bowel: Dilated air and fluid-filled appendix with peripheral hyperemia measuring up to 2.3 cm in diameter. Small amount extraluminal gas extending from the base of the appendix (3/70) as well as surrounding fluid and phlegmonous change. No discrete or organized abscess has formed at this time. The air is reactive thickening of the cecal tip as well as the adjacent ileum which courses in close proximity. No resulting obstruction in the inflamed portions of the bowel. Distal esophagus, stomach and more proximal small bowel are unremarkable. More distal colonic segments are free of acute abnormality. Vascular/Lymphatic: The aorta is normal caliber. Reactive adenopathy in the right lower quadrant. No suspicious or enlarged lymph nodes in the included lymphatic chains. Reproductive: The prostate and seminal vesicles are unremarkable. Other: There is extensive fluid and phlegmonous change centered upon the inflamed appendix in the right lower quadrant. Additional intermediate attenuation (18-25 HU) fluid is seen in the low pelvis and left upper quadrant, likely redistributed. Additional foci of anti dependent gas are noted along the anterior peritoneal surface. Some peritoneal thickening is noted in the right and left lower quadrants compatible with developing peritonitis. Musculoskeletal: No acute osseous abnormality or suspicious osseous lesion. IMPRESSION: 1. Perforated appendicitis with extensive fluid and  phlegmonous change centered upon the inflamed appendix in the right lower quadrant. No discrete or organized abscess has formed at this time. Evidence of developing peritonitis. 2. Reactive thickening of the cecal tip as well as the adjacent ileum which courses in close proximity. No resulting obstruction in the inflamed portions of the bowel. Electronically Signed   By: Coralie Keens.D.  On: 07/24/2019 19:39   DG Abd Portable 1 View  Result Date: 07/24/2019 CLINICAL DATA:  Abdomen pain EXAM: PORTABLE ABDOMEN - 1 VIEW COMPARISON:  CT abdomen pelvis March 29, 2017 FINDINGS: The bowel gas pattern is normal. No radio-opaque calculi or other significant radiographic abnormality are seen. IMPRESSION: No bowel obstruction. Electronically Signed   By: Sherian ReinWei-Chen  Lin M.D.   On: 07/24/2019 18:42    Assessment & Plan:    Sonda PrimesAlex Devanee Pomplun, MD

## 2020-06-06 ENCOUNTER — Encounter: Payer: Self-pay | Admitting: Internal Medicine

## 2020-06-12 DIAGNOSIS — Z0279 Encounter for issue of other medical certificate: Secondary | ICD-10-CM

## 2020-06-12 NOTE — Telephone Encounter (Signed)
Forms have been signed, Faxed, Copy sent to scan &Charged for.   LVM &Mychart message to inform patient.  Original mailed to patient for his records.

## 2020-06-16 ENCOUNTER — Other Ambulatory Visit: Payer: Self-pay | Admitting: Internal Medicine

## 2020-06-16 MED FILL — AMLODIPINE-OLMESARTAN 10-40: 10-40 | 90 days supply | Qty: 90 | Fill #1

## 2020-06-17 MED FILL — ALPRAZOLAM 2 MG TABS: 2 | 30 days supply | Qty: 120 | Fill #0

## 2020-06-17 MED FILL — AMPHETAMINE SALTS 10 MG: 10 | 30 days supply | Qty: 60 | Fill #0

## 2020-06-17 MED FILL — HYDROCODON-APAP 10-325: 10-325 | 30 days supply | Qty: 120 | Fill #0

## 2020-06-19 ENCOUNTER — Telehealth (INDEPENDENT_AMBULATORY_CARE_PROVIDER_SITE_OTHER): Payer: 59 | Admitting: Adult Health

## 2020-06-19 ENCOUNTER — Encounter: Payer: Self-pay | Admitting: Adult Health

## 2020-06-19 VITALS — Temp 100.1°F | Wt 220.0 lb

## 2020-06-19 DIAGNOSIS — J988 Other specified respiratory disorders: Secondary | ICD-10-CM

## 2020-06-19 MED ORDER — PREDNISONE 10 MG PO TABS: ORAL_TABLET | ORAL | 0 refills | Status: DC

## 2020-06-19 MED ORDER — BENZONATATE 200 MG PO CAPS: 200.0000 mg | ORAL_CAPSULE | Freq: Two times a day (BID) | ORAL | 0 refills | Status: DC | PRN

## 2020-06-19 MED ORDER — DOXYCYCLINE HYCLATE 100 MG PO CAPS: 100.0000 mg | ORAL_CAPSULE | Freq: Two times a day (BID) | ORAL | 0 refills | Status: DC

## 2020-06-19 NOTE — Progress Notes (Signed)
Virtual Visit via Video Note  I connected with Mark Mcgee  on 06/19/20 at  4:00 PM EDT by a video enabled telemedicine application and verified that I am speaking with the correct person using two identifiers.  Location patient: home Location provider:work or home office Persons participating in the virtual visit: patient, provider  I discussed the limitations of evaluation and management by telemedicine and the availability of in person appointments. The patient expressed understanding and agreed to proceed.   HPI:  54 year old male who is a patient of Dr. Posey Rea who I am seeing today for an acute issue.  Symptoms started 4 days ago and have progressively gotten worse.  On the day that his symptoms started he developed fever and chills, the next day he had a hoarse voice, headache, and a constant dry cough.  Yesterday he developed a sore throat.  Associated symptoms include severe fatigue.  He has not noticed any white spots on the back of his throat.  Fever has been up to 101.3   He is fully vaccinated as of 2 weeks ago.  He was unable to find a testing facility near his home and feels too weak to drive closer to [facility] in order to be tested.  He was supposed to receive at home rapid test in the mail but has not received this yet.  Denies any sick contacts and works from home.  ROS: See pertinent positives and negatives per HPI.  Past Medical History:  Diagnosis Date   Anxiety    Costochondritis    recurrent    Depression    Dr Dub Mikes   Fatigue 2009   Genital herpes 2009   GERD (gastroesophageal reflux disease)    Heat stroke    HTN (hypertension)    Hyperthyroidism    IBS (irritable bowel syndrome)    Panic attack    Prostatitis    Very painful flare-ups Dr Retta Diones   Tremor     Past Surgical History:  Procedure Laterality Date   LAPAROSCOPIC ILEOCECECTOMY  07/25/2019   Procedure: Laparoscopic assisted  Ileocecectomy;  Surgeon: Andria Meuse, MD;  Location: [facility] OR;  Service: General;;   MANDIBLE SURGERY     Right    Family History  Problem Relation Age of Onset   Hyperlipidemia Other    Coronary artery disease Other    Stroke Mother    Colon cancer Paternal Grandmother    Diabetes Other    Breast cancer Other    Pancreatic cancer Other    Cancer Other        breast, pancreatic   Heart disease Other    Cancer Maternal Grandmother        colon       Current Outpatient Medications:    acetaminophen (TYLENOL) 325 MG tablet, Take 325-650 mg by mouth every 6 (six) hours as needed for mild pain or headache. , Disp: , Rfl:    acyclovir (ZOVIRAX) 400 MG tablet, Take 1 tablet (400 mg total) by mouth 4 (four) times daily as needed (as directed for outbreaks)., Disp: 60 tablet, Rfl: 3   albuterol (VENTOLIN HFA) 108 (90 Base) MCG/ACT inhaler, Inhale 2 puffs into the lungs every 4 (four) hours as needed for wheezing or shortness of breath., Disp: 18 g, Rfl: 5   alprazolam (XANAX) 2 MG tablet, TAKE 1 TABLET BY MOUTH 4 TIMES DAILY AS NEEDED, Disp: 120 tablet, Rfl: 1   amLODipine-olmesartan (AZOR) 10-40 MG tablet, TAKE 1 TABLET BY MOUTH ONCE DAILY,  Disp: 90 tablet, Rfl: 3   amphetamine-dextroamphetamine (ADDERALL) 10 MG tablet, TAKE 1 TABLET BY MOUTH TWO TIMES DAILY WITH BREAKFAST AND LUNCH, Disp: 60 tablet, Rfl: 0   b complex vitamins tablet, Take 1 tablet by mouth daily.  , Disp: , Rfl:    Cholecalciferol (VITAMIN D3) 50 MCG (2000 UT) TABS, Take 2,000 Units by mouth daily with breakfast., Disp: , Rfl:    diphenhydrAMINE (BENADRYL) 25 mg capsule, Take 25 mg by mouth every 6 (six) hours as needed (for seasonal allergies)., Disp: , Rfl:    diphenoxylate-atropine (LOMOTIL) 2.5-0.025 MG tablet, TAKE 1 OR 2 TABLETS BY MOUTH 4 TIMES DAILY AS NEEDED FOR DIARRHEA OR LOOSE STOOLS MAX OF 8 TABLETS PER DAY (Patient taking differently: Take 1-2 tablets by mouth 4 (four) times daily as needed for diarrhea or loose stools (MAX  OF 8 TABLETS/24 HOURS). ), Disp: 60 tablet, Rfl: 3   Flaxseed, Linseed, (FLAX SEEDS) POWD, 2 (two) times daily as needed (as directed when mixing into health shakes). , Disp: , Rfl:    glycopyrrolate (ROBINUL) 2 MG tablet, Take 1 tablet (2 mg total) by mouth 2 (two) times daily. (Patient taking differently: Take 2 mg by mouth 2 (two) times daily as needed (spasms). ), Disp: 180 tablet, Rfl: 0   halobetasol (ULTRAVATE) 0.05 % ointment, Apply topically 2 (two) times daily. (Patient taking differently: Apply 1 application topically 2 (two) times daily as needed (for rashes). ), Disp: 50 g, Rfl: 3   HYDROcodone-acetaminophen (NORCO) 10-325 MG tablet, TAKE 1 TABLET BY MOUTH EVERY 6 (SIX) HOURS AS NEEDED FOR PAIN, Disp: 120 tablet, Rfl: 0   ipratropium (ATROVENT) 0.03 % nasal spray, Place 2 sprays into both nostrils 2 (two) times daily. (Patient taking differently: Place 2 sprays into both nostrils 2 (two) times daily as needed (for seasonal allergies). ), Disp: 30 mL, Rfl: 5   loperamide (IMODIUM) 2 MG capsule, Take 2 mg by mouth as needed for diarrhea or loose stools., Disp: , Rfl:    methylPREDNISolone (MEDROL DOSEPAK) 4 MG TBPK tablet, As directed, Disp: 21 tablet, Rfl: 0   Multiple Vitamin (MULTIVITAMIN) capsule, Take 1 capsule by mouth daily. , Disp: , Rfl:    ondansetron (ZOFRAN) 4 MG tablet, Take 1 tablet (4 mg total) by mouth every 8 (eight) hours as needed for nausea or vomiting., Disp: 12 tablet, Rfl: 0   SYRINGE-NEEDLE, DISP, 3 ML (BD ECLIPSE SYRINGE) 25G X 1" 3 ML MISC, As directed for IM injections, Disp: 50 each, Rfl: 5   tadalafil (CIALIS) 5 MG tablet, TAKE 1 TABLET BY MOUTH ONCE DAILY, Disp: 90 tablet, Rfl: 3   testosterone cypionate (DEPOTESTOSTERONE CYPIONATE) 200 MG/ML injection, INJECT 1 ML (200 MG) INTO THE MUSCLE EVERY 14 DAYS, Disp: 10 mL, Rfl: 5   triamcinolone ointment (KENALOG) 0.1 %, Apply 1 application topically 2 (two) times daily. (Patient taking differently: Apply  1 application topically 2 (two) times daily as needed (for rashes). ), Disp: 80 g, Rfl: 2   Zinc 50 MG CAPS, 1 cap qd, Disp: 100 capsule, Rfl: 3   zinc gluconate 50 MG tablet, Take 100 mg by mouth daily., Disp: , Rfl:   EXAM:  VITALS per patient if applicable:  GENERAL: alert, oriented, appears acutely ill  HEENT: atraumatic, conjunttiva clear, no obvious abnormalities on inspection of external nose and ears  NECK: normal movements of the head and neck  LUNGS: on inspection no signs of respiratory distress, breathing rate appears normal, no obvious gross SOB,  hoarse voice with constant dry cough.   CV: no obvious cyanosis  MS: moves all visible extremities without noticeable abnormality  PSYCH/NEURO: pleasant and cooperative, no obvious depression or anxiety, speech and thought processing grossly intact  ASSESSMENT AND PLAN:  Discussed the following assessment and plan:  1. Respiratory infection -Unknown cause.  Could be breakthrough Covid infection.  He was advised to take his rapid test if it comes back positive then he can stop the doxycycline which will cover for bacterial infection.  Also send in prednisone and Tessalon Perles. -Follow-up with PCP if not improving in the next 2 to 3 days - predniSONE (DELTASONE) 10 MG tablet; 40 mg x 3 days, 20 mg x 3 days, 10 mg x 3 days  Dispense: 21 tablet; Refill: 0 - doxycycline (VIBRAMYCIN) 100 MG capsule; Take 1 capsule (100 mg total) by mouth 2 (two) times daily.  Dispense: 14 capsule; Refill: 0 - benzonatate (TESSALON) 200 MG capsule; Take 1 capsule (200 mg total) by mouth 2 (two) times daily as needed for cough.  Dispense: 20 capsule; Refill: 0     I discussed the assessment and treatment plan with the patient. The patient was provided an opportunity to ask questions and all were answered. The patient agreed with the plan and demonstrated an understanding of the instructions.   The patient was advised to call back or seek an  in-person evaluation if the symptoms worsen or if the condition fails to improve as anticipated.   Shirline Frees, NP

## 2020-06-23 ENCOUNTER — Encounter: Payer: Self-pay | Admitting: Adult Health

## 2020-06-27 ENCOUNTER — Telehealth: Payer: Self-pay | Admitting: Internal Medicine

## 2020-06-27 NOTE — Telephone Encounter (Signed)
Patient called and wanted to let Dr. Posey Rea know that his Covid test came back negative. He says that he is not feeling any better. He was wondering if there was anything else that he needed to do.

## 2020-07-01 NOTE — Telephone Encounter (Signed)
I agree with prescribed treatment. Please rest. Hope he is better. Thanks

## 2020-07-28 ENCOUNTER — Other Ambulatory Visit: Payer: Self-pay | Admitting: Internal Medicine

## 2020-07-28 MED FILL — ALPRAZOLAM 2 MG TABS: 2 | 30 days supply | Qty: 120 | Fill #1

## 2020-07-28 NOTE — Telephone Encounter (Signed)
Check [place] registry last filled both 06/17/2020.Marland KitchenRaechel Chute

## 2020-07-29 ENCOUNTER — Other Ambulatory Visit: Payer: Self-pay | Admitting: Internal Medicine

## 2020-07-29 MED FILL — AMPHETAMINE SALTS 10 MG: 10 | 30 days supply | Qty: 60 | Fill #0

## 2020-07-29 MED FILL — HYDROCODON-APAP 10-325: 10-325 | 30 days supply | Qty: 120 | Fill #0

## 2020-09-03 ENCOUNTER — Other Ambulatory Visit: Payer: Self-pay | Admitting: Internal Medicine

## 2020-09-03 MED FILL — TADALAFIL 5 MG TABS: 5 | 90 days supply | Qty: 90 | Fill #3

## 2020-09-03 NOTE — Telephone Encounter (Signed)
Check [place] registry last filled Alprazolam 07/28/20, and other two on 07/29/2020.Marland KitchenRaechel Chute

## 2020-09-04 ENCOUNTER — Ambulatory Visit: Payer: 59 | Admitting: Internal Medicine

## 2020-09-04 ENCOUNTER — Other Ambulatory Visit: Payer: Self-pay | Admitting: Internal Medicine

## 2020-09-04 ENCOUNTER — Encounter: Payer: Self-pay | Admitting: Internal Medicine

## 2020-09-04 ENCOUNTER — Other Ambulatory Visit: Payer: Self-pay

## 2020-09-04 DIAGNOSIS — F4323 Adjustment disorder with mixed anxiety and depressed mood: Secondary | ICD-10-CM

## 2020-09-04 DIAGNOSIS — M5440 Lumbago with sciatica, unspecified side: Secondary | ICD-10-CM

## 2020-09-04 DIAGNOSIS — R151 Fecal smearing: Secondary | ICD-10-CM

## 2020-09-04 DIAGNOSIS — F418 Other specified anxiety disorders: Secondary | ICD-10-CM | POA: Diagnosis not present

## 2020-09-04 DIAGNOSIS — G8929 Other chronic pain: Secondary | ICD-10-CM

## 2020-09-04 MED ORDER — AMPHETAMINE-DEXTROAMPHETAMINE 10 MG PO TABS
ORAL_TABLET | ORAL | 0 refills | Status: DC
Start: 2020-09-04 — End: 2020-10-13

## 2020-09-04 MED ORDER — TADALAFIL 5 MG PO TABS
5.0000 mg | ORAL_TABLET | Freq: Every day | ORAL | 3 refills | Status: DC
Start: 2020-09-04 — End: 2022-05-27

## 2020-09-04 MED ORDER — HYDROCODONE-ACETAMINOPHEN 10-325 MG PO TABS: 1.0000 | ORAL_TABLET | Freq: Four times a day (QID) | ORAL | 0 refills | Status: DC | PRN

## 2020-09-04 MED ORDER — ALPRAZOLAM 2 MG PO TABS
ORAL_TABLET | ORAL | 1 refills | Status: DC
Start: 2020-09-04 — End: 2020-11-14

## 2020-09-04 MED FILL — ALPRAZOLAM 2 MG TABS: 2 | 30 days supply | Qty: 120 | Fill #0

## 2020-09-04 MED FILL — AMPHETAMINE SALTS 10 MG: 10 | 30 days supply | Qty: 60 | Fill #0

## 2020-09-04 MED FILL — HYDROCODON-APAP 10-325: 10-325 | 30 days supply | Qty: 120 | Fill #0

## 2020-09-04 NOTE — Assessment & Plan Note (Signed)
Xanax prn  Potential benefits of a long term benzodiazepines  use as well as potential risks  and complications were explained to the patient and were aknowledged. 

## 2020-09-04 NOTE — Progress Notes (Signed)
Subjective:  Patient ID: Mark Redoaul A Goethe, male    DOB: 1966-04-18  Age: 54 y.o. MRN: [id number]  CC: Follow-up (3 MONTH F/U)   HPI Mark Mcgee presents for LBP, anxiety stress Mother is not doing well, sister is very ill - Renae Fickleaul is taking care of 53 yo nefew C/o diarrhea, wt gain  Outpatient Medications Prior to Visit  Medication Sig Dispense Refill  . acetaminophen (TYLENOL) 325 MG tablet Take 325-650 mg by mouth every 6 (six) hours as needed for mild pain or headache.     [name]. acyclovir (ZOVIRAX) 400 MG tablet Take 1 tablet (400 mg total) by mouth 4 (four) times daily as needed (as directed for outbreaks). 60 tablet 3  . albuterol (VENTOLIN HFA) 108 (90 Base) MCG/ACT inhaler Inhale 2 puffs into the lungs every 4 (four) hours as needed for wheezing or shortness of breath. 18 g 5  . alprazolam (XANAX) 2 MG tablet TAKE 1 TABLET BY MOUTH 4 TIMES DAILY AS NEEDED 120 tablet 1  . amLODipine-olmesartan (AZOR) 10-40 MG tablet TAKE 1 TABLET BY MOUTH ONCE DAILY 90 tablet 3  . amphetamine-dextroamphetamine (ADDERALL) 10 MG tablet TAKE 1 TABLET BY MOUTH TWICE DAILY WITH BREAKFAST AND LUNCH 60 tablet 0  . b complex vitamins tablet Take 1 tablet by mouth daily.    . benzonatate (TESSALON) 200 MG capsule Take 1 capsule (200 mg total) by mouth 2 (two) times daily as needed for cough. 20 capsule 0  . Cholecalciferol (VITAMIN D3) 50 MCG (2000 UT) TABS Take 2,000 Units by mouth daily with breakfast.    . diphenhydrAMINE (BENADRYL) 25 mg capsule Take 25 mg by mouth every 6 (six) hours as needed (for seasonal allergies).    . diphenoxylate-atropine (LOMOTIL) 2.5-0.025 MG tablet TAKE 1 OR 2 TABLETS BY MOUTH 4 TIMES DAILY AS NEEDED FOR DIARRHEA OR LOOSE STOOLS MAX OF 8 TABLETS PER DAY (Patient taking differently: Take 1-2 tablets by mouth 4 (four) times daily as needed for diarrhea or loose stools (MAX OF 8 TABLETS/24 HOURS).) 60 tablet 3  . doxycycline (VIBRAMYCIN) 100 MG capsule Take 1 capsule (100 mg total)  by mouth 2 (two) times daily. 14 capsule 0  . Flaxseed, Linseed, (FLAX SEEDS) POWD 2 (two) times daily as needed (as directed when mixing into health shakes).    [name]. glycopyrrolate (ROBINUL) 2 MG tablet Take 1 tablet (2 mg total) by mouth 2 (two) times daily. (Patient taking differently: Take 2 mg by mouth 2 (two) times daily as needed (spasms).) 180 tablet 0  . halobetasol (ULTRAVATE) 0.05 % ointment Apply topically 2 (two) times daily. (Patient taking differently: Apply 1 application topically 2 (two) times daily as needed (for rashes).) 50 g 3  . HYDROcodone-acetaminophen (NORCO) 10-325 MG tablet TAKE 1 TABLET BY MOUTH EVERY 6 HOURS AS NEEDED FOR PAIN 120 tablet 0  . ipratropium (ATROVENT) 0.03 % nasal spray Place 2 sprays into both nostrils 2 (two) times daily. (Patient taking differently: Place 2 sprays into both nostrils 2 (two) times daily as needed (for seasonal allergies).) 30 mL 5  . loperamide (IMODIUM) 2 MG capsule Take 2 mg by mouth as needed for diarrhea or loose stools.    . methylPREDNISolone (MEDROL DOSEPAK) 4 MG TBPK tablet As directed 21 tablet 0  . Multiple Vitamin (MULTIVITAMIN) capsule Take 1 capsule by mouth daily.     . ondansetron (ZOFRAN) 4 MG tablet Take 1 tablet (4 mg total) by mouth every 8 (eight) hours as needed for nausea  or vomiting. 12 tablet 0  . predniSONE (DELTASONE) 10 MG tablet 40 mg x 3 days, 20 mg x 3 days, 10 mg x 3 days 21 tablet 0  . SYRINGE-NEEDLE, DISP, 3 ML (BD ECLIPSE SYRINGE) 25G X 1" 3 ML MISC As directed for IM injections 50 each 5  . tadalafil (CIALIS) 5 MG tablet TAKE 1 TABLET BY MOUTH ONCE DAILY 90 tablet 3  . testosterone cypionate (DEPOTESTOSTERONE CYPIONATE) 200 MG/ML injection INJECT 1 ML (200 MG) INTO THE MUSCLE EVERY 14 DAYS 10 mL 5  . triamcinolone ointment (KENALOG) 0.1 % Apply 1 application topically 2 (two) times daily. (Patient taking differently: Apply 1 application topically 2 (two) times daily as needed (for rashes).) 80 g 2  . Zinc 50  MG CAPS 1 cap qd 100 capsule 3  . zinc gluconate 50 MG tablet Take 100 mg by mouth daily.     No facility-administered medications prior to visit.    ROS: Review of Systems  Constitutional: Positive for fatigue. Negative for appetite change and unexpected weight change.  HENT: Negative for congestion, nosebleeds, sneezing, sore throat and trouble swallowing.   Eyes: Negative for itching and visual disturbance.  Respiratory: Negative for cough.   Cardiovascular: Negative for chest pain, palpitations and leg swelling.  Gastrointestinal: Positive for abdominal pain, diarrhea and nausea. Negative for abdominal distention and blood in stool.  Genitourinary: Negative for frequency and hematuria.  Musculoskeletal: Positive for arthralgias and back pain. Negative for gait problem, joint swelling and neck pain.  Skin: Negative for rash.  Neurological: Positive for weakness. Negative for dizziness, tremors and speech difficulty.  Psychiatric/Behavioral: Negative for agitation, dysphoric mood and sleep disturbance. The patient is not nervous/anxious.     Objective:  BP 112/72 (BP Location: Left Arm)   Pulse 70   Temp 98.2 F (36.8 C) (Oral)   Wt 231 lb 3.2 oz (104.9 kg)   SpO2 98%   BMI 36.21 kg/m   BP Readings from Last 3 Encounters:  09/04/20 112/72  06/05/20 128/90  05/06/20 124/80    Wt Readings from Last 3 Encounters:  09/04/20 231 lb 3.2 oz (104.9 kg)  06/19/20 220 lb (99.8 kg)  06/05/20 225 lb (102.1 kg)    Physical Exam Constitutional:      General: He is not in acute distress.    Appearance: He is well-developed. He is obese.     Comments: NAD  HENT:     Mouth/Throat:     Mouth: Oropharynx is clear and moist.  Eyes:     Conjunctiva/sclera: Conjunctivae normal.     Pupils: Pupils are equal, round, and reactive to light.  Neck:     Thyroid: No thyromegaly.     Vascular: No JVD.  Cardiovascular:     Rate and Rhythm: Normal rate and regular rhythm.     Pulses:  Intact distal pulses.     Heart sounds: Normal heart sounds. No murmur heard. No friction rub. No gallop.   Pulmonary:     Effort: Pulmonary effort is normal. No respiratory distress.     Breath sounds: Normal breath sounds. No wheezing or rales.  Chest:     Chest wall: No tenderness.  Abdominal:     General: Bowel sounds are normal. There is no distension.     Palpations: Abdomen is soft. There is no mass.     Tenderness: There is no abdominal tenderness. There is no guarding or rebound.  Musculoskeletal:        General:  Tenderness present. No edema. Normal range of motion.     Cervical back: Normal range of motion.  Lymphadenopathy:     Cervical: No cervical adenopathy.  Skin:    General: Skin is warm and dry.     Findings: No rash.  Neurological:     Mental Status: He is alert and oriented to person, place, and time.     Cranial Nerves: No cranial nerve deficit.     Motor: No abnormal muscle tone.     Coordination: He displays a negative Romberg sign. Coordination normal.     Gait: Gait normal.     Deep Tendon Reflexes: Reflexes are normal and symmetric.  Psychiatric:        Mood and Affect: Mood and affect normal.        Behavior: Behavior normal.        Thought Content: Thought content normal.        Judgment: Judgment normal.     Lab Results  Component Value Date   WBC 5.3 12/04/2019   HGB 14.0 12/04/2019   HCT 43.5 12/04/2019   PLT 240.0 12/04/2019   GLUCOSE 100 (H) 05/06/2020   CHOL 170 12/04/2019   TRIG 212.0 (H) 12/04/2019   HDL 41.00 12/04/2019   LDLDIRECT 91.0 12/04/2019   LDLCALC 114 (H) 11/28/2018   ALT 16 05/06/2020   AST 13 05/06/2020   NA 141 05/06/2020   K 4.9 05/06/2020   CL 106 05/06/2020   CREATININE 1.23 05/06/2020   BUN 18 05/06/2020   CO2 25 05/06/2020   TSH 2.90 12/04/2019   PSA 0.86 12/04/2019    CT ABDOMEN PELVIS W CONTRAST  Result Date: 07/24/2019 CLINICAL DATA:  Right lower quadrant pain for 3 days, constipation EXAM: CT  ABDOMEN AND PELVIS WITH CONTRAST TECHNIQUE: Multidetector CT imaging of the abdomen and pelvis was performed using the standard protocol following bolus administration of intravenous contrast. CONTRAST:  OMNIPAQUE IOHEXOL 300 MG/ML  SOLN COMPARISON:  CT abdomen pelvis 03/29/2017 FINDINGS: Lower chest: Basilar atelectatic changes. Lung bases otherwise clear. Normal heart size. No pericardial effusion. Hepatobiliary: No focal liver abnormality is seen. Prominent fold at the neck of the gallbladder. No calcified gallstones, pericholecystic inflammation or biliary ductal dilatation. Pancreas: Partial fatty replacement of the pancreas. No peripancreatic inflammation or ductal dilatation. Spleen: Normal in size without focal abnormality. Adrenals/Urinary Tract: Normal adrenal glands. 11 mm hypoattenuating simple appearing fluid attenuation cyst in the interpolar right kidney. Kidneys are otherwise unremarkable, without renal calculi, suspicious lesion, or hydronephrosis. Bladder is unremarkable. No extravasation of contrast is seen on excretory phase delayed imaging. Stomach/Bowel: Dilated air and fluid-filled appendix with peripheral hyperemia measuring up to 2.3 cm in diameter. Small amount extraluminal gas extending from the base of the appendix (3/70) as well as surrounding fluid and phlegmonous change. No discrete or organized abscess has formed at this time. The air is reactive thickening of the cecal tip as well as the adjacent ileum which courses in close proximity. No resulting obstruction in the inflamed portions of the bowel. Distal esophagus, stomach and more proximal small bowel are unremarkable. More distal colonic segments are free of acute abnormality. Vascular/Lymphatic: The aorta is normal caliber. Reactive adenopathy in the right lower quadrant. No suspicious or enlarged lymph nodes in the included lymphatic chains. Reproductive: The prostate and seminal vesicles are unremarkable. Other: There is  extensive fluid and phlegmonous change centered upon the inflamed appendix in the right lower quadrant. Additional intermediate attenuation (18-25 HU) fluid is seen  in the low pelvis and left upper quadrant, likely redistributed. Additional foci of anti dependent gas are noted along the anterior peritoneal surface. Some peritoneal thickening is noted in the right and left lower quadrants compatible with developing peritonitis. Musculoskeletal: No acute osseous abnormality or suspicious osseous lesion. IMPRESSION: 1. Perforated appendicitis with extensive fluid and phlegmonous change centered upon the inflamed appendix in the right lower quadrant. No discrete or organized abscess has formed at this time. Evidence of developing peritonitis. 2. Reactive thickening of the cecal tip as well as the adjacent ileum which courses in close proximity. No resulting obstruction in the inflamed portions of the bowel. Electronically Signed   By: Kreg Shropshire M.D.   On: 07/24/2019 19:39   DG Abd Portable 1 View  Result Date: 07/24/2019 CLINICAL DATA:  Abdomen pain EXAM: PORTABLE ABDOMEN - 1 VIEW COMPARISON:  CT abdomen pelvis March 29, 2017 FINDINGS: The bowel gas pattern is normal. No radio-opaque calculi or other significant radiographic abnormality are seen. IMPRESSION: No bowel obstruction. Electronically Signed   By: Sherian Rein M.D.   On: 07/24/2019 18:42    Assessment & Plan:     Follow-up: No follow-ups on file.  Sonda Primes, MD

## 2020-09-04 NOTE — Patient Instructions (Signed)
   B-complex with Niacin 100 mg    Lion's mane  

## 2020-09-04 NOTE — Assessment & Plan Note (Signed)
Not better F/u w/Dr Russella Dar

## 2020-09-04 NOTE — Assessment & Plan Note (Signed)
Xanax prn, Paxil 

## 2020-09-04 NOTE — Assessment & Plan Note (Signed)
Norco prn  Potential benefits of a long term opioids use as well as potential risks (i.e. addiction risk, apnea etc) and complications (i.e. Somnolence, constipation and others) were explained to the patient and were aknowledged. 

## 2020-10-07 ENCOUNTER — Other Ambulatory Visit: Payer: Self-pay | Admitting: Internal Medicine

## 2020-10-07 MED FILL — ALPRAZOLAM 2 MG TABS: 2 | 30 days supply | Qty: 120 | Fill #1

## 2020-10-13 ENCOUNTER — Other Ambulatory Visit: Payer: Self-pay | Admitting: Internal Medicine

## 2020-10-13 MED FILL — AMPHETAMINE SALTS 10 MG: 10 | 30 days supply | Qty: 60 | Fill #0

## 2020-10-13 MED FILL — HYDROCODON-APAP 10-325: 10-325 | 30 days supply | Qty: 120 | Fill #0

## 2020-10-24 DIAGNOSIS — U071 COVID-19: Secondary | ICD-10-CM | POA: Insufficient documentation

## 2020-11-14 ENCOUNTER — Other Ambulatory Visit: Payer: Self-pay | Admitting: Internal Medicine

## 2020-11-14 MED FILL — ALPRAZOLAM 2 MG TABS: 2 | 30 days supply | Qty: 120 | Fill #0

## 2020-11-14 MED FILL — AMPHETAMINE SALTS 10 MG: 10 | 30 days supply | Qty: 60 | Fill #0

## 2020-11-14 MED FILL — HYDROCODON-APAP 10-325: 10-325 | 30 days supply | Qty: 120 | Fill #0

## 2020-11-14 NOTE — Telephone Encounter (Signed)
See below

## 2020-12-04 ENCOUNTER — Encounter: Payer: Self-pay | Admitting: Internal Medicine

## 2020-12-04 ENCOUNTER — Other Ambulatory Visit: Payer: Self-pay | Admitting: Internal Medicine

## 2020-12-04 ENCOUNTER — Ambulatory Visit: Payer: 59 | Admitting: Internal Medicine

## 2020-12-04 ENCOUNTER — Other Ambulatory Visit: Payer: Self-pay

## 2020-12-04 DIAGNOSIS — E291 Testicular hypofunction: Secondary | ICD-10-CM

## 2020-12-04 DIAGNOSIS — F418 Other specified anxiety disorders: Secondary | ICD-10-CM

## 2020-12-04 DIAGNOSIS — M5440 Lumbago with sciatica, unspecified side: Secondary | ICD-10-CM

## 2020-12-04 DIAGNOSIS — R197 Diarrhea, unspecified: Secondary | ICD-10-CM

## 2020-12-04 DIAGNOSIS — K580 Irritable bowel syndrome with diarrhea: Secondary | ICD-10-CM | POA: Diagnosis not present

## 2020-12-04 DIAGNOSIS — G8929 Other chronic pain: Secondary | ICD-10-CM

## 2020-12-04 DIAGNOSIS — F988 Other specified behavioral and emotional disorders with onset usually occurring in childhood and adolescence: Secondary | ICD-10-CM | POA: Diagnosis not present

## 2020-12-04 MED ORDER — TESTOSTERONE CYPIONATE 200 MG/ML IM SOLN: INTRAMUSCULAR | 5 refills | Status: DC

## 2020-12-04 MED ORDER — "BD ECLIPSE SYRINGE 25G X 1"" 3 ML MISC": 5 refills | Status: DC

## 2020-12-04 MED FILL — BD 3 ML SYRINGE 25GX1: 25G X 1" | 336 days supply | Qty: 24 | Fill #0

## 2020-12-04 MED FILL — TESTOSTERONE CYP 200 MG/ML: 200 | 84 days supply | Qty: 6 | Fill #0

## 2020-12-04 NOTE — Progress Notes (Signed)
Subjective:  Patient ID: DOMENICK QUEBEDEAUX, male    DOB: [birth year]  Age: 55 y.o. MRN: [id number]  CC: Follow-up (3 month f/u)   HPI NHAT HEARNE presents for LBP, ADD, hypogonadism f/u C/o stress at home with his sick mother and sister.  Delio is concerned about his weight gain in spite of being on a very limited diet, fasting.  Outpatient Medications Prior to Visit  Medication Sig Dispense Refill  . acetaminophen (TYLENOL) 325 MG tablet Take 325-650 mg by mouth every 6 (six) hours as needed for mild pain or headache.     [name] acyclovir (ZOVIRAX) 400 MG tablet Take 1 tablet (400 mg total) by mouth 4 (four) times daily as needed (as directed for outbreaks). 60 tablet 3  . albuterol (VENTOLIN HFA) 108 (90 Base) MCG/ACT inhaler Inhale 2 puffs into the lungs every 4 (four) hours as needed for wheezing or shortness of breath. 18 g 5  . alprazolam (XANAX) 2 MG tablet TAKE 1 TABLET BY MOUTH 4 TIMES DAILY AS NEEDED 120 tablet 1  . amLODipine-olmesartan (AZOR) 10-40 MG tablet TAKE 1 TABLET BY MOUTH ONCE DAILY 90 tablet 3  . amphetamine-dextroamphetamine (ADDERALL) 10 MG tablet TAKE 1 TABLET BY MOUTH 2 TIMES DAILY WITH BREAKFAST AND LUNCH 60 tablet 0  . b complex vitamins tablet Take 1 tablet by mouth daily.    . benzonatate (TESSALON) 200 MG capsule Take 1 capsule (200 mg total) by mouth 2 (two) times daily as needed for cough. 20 capsule 0  . Cholecalciferol (VITAMIN D3) 50 MCG (2000 UT) TABS Take 2,000 Units by mouth daily with breakfast.    . diphenhydrAMINE (BENADRYL) 25 mg capsule Take 25 mg by mouth every 6 (six) hours as needed (for seasonal allergies).    . diphenoxylate-atropine (LOMOTIL) 2.5-0.025 MG tablet TAKE 1 OR 2 TABLETS BY MOUTH 4 TIMES DAILY AS NEEDED FOR DIARRHEA OR LOOSE STOOLS MAX OF 8 TABLETS PER DAY (Patient taking differently: Take 1-2 tablets by mouth 4 (four) times daily as needed for diarrhea or loose stools (MAX OF 8 TABLETS/24 HOURS).) 60 tablet 3  . Flaxseed, Linseed,  (FLAX SEEDS) POWD 2 (two) times daily as needed (as directed when mixing into health shakes).    [name] glycopyrrolate (ROBINUL) 2 MG tablet Take 1 tablet (2 mg total) by mouth 2 (two) times daily. (Patient taking differently: Take 2 mg by mouth 2 (two) times daily as needed (spasms).) 180 tablet 0  . halobetasol (ULTRAVATE) 0.05 % ointment Apply topically 2 (two) times daily. (Patient taking differently: Apply 1 application topically 2 (two) times daily as needed (for rashes).) 50 g 3  . HYDROcodone-acetaminophen (NORCO) 10-325 MG tablet TAKE 1 TABLET BY MOUTH EVERY 6 HOURS AS NEEDED FOR PAIN 120 tablet 0  . ipratropium (ATROVENT) 0.03 % nasal spray Place 2 sprays into both nostrils 2 (two) times daily. (Patient taking differently: Place 2 sprays into both nostrils 2 (two) times daily as needed (for seasonal allergies).) 30 mL 5  . loperamide (IMODIUM) 2 MG capsule Take 2 mg by mouth as needed for diarrhea or loose stools.    . Multiple Vitamin (MULTIVITAMIN) capsule Take 1 capsule by mouth daily.     . ondansetron (ZOFRAN) 4 MG tablet Take 1 tablet (4 mg total) by mouth every 8 (eight) hours as needed for nausea or vomiting. 12 tablet 0  . SYRINGE-NEEDLE, DISP, 3 ML (BD ECLIPSE SYRINGE) 25G X 1" 3 ML MISC As directed for IM injections 50 each 5  .  tadalafil (CIALIS) 5 MG tablet Take 1 tablet (5 mg total) by mouth daily. 90 tablet 3  . testosterone cypionate (DEPOTESTOSTERONE CYPIONATE) 200 MG/ML injection INJECT 1 ML (200 MG) INTO THE MUSCLE EVERY 14 DAYS 10 mL 5  . triamcinolone ointment (KENALOG) 0.1 % Apply 1 application topically 2 (two) times daily. (Patient taking differently: Apply 1 application topically 2 (two) times daily as needed (for rashes).) 80 g 2  . zinc gluconate 50 MG tablet Take 100 mg by mouth daily.    [name] doxycycline (VIBRAMYCIN) 100 MG capsule Take 1 capsule (100 mg total) by mouth 2 (two) times daily. 14 capsule 0  . methylPREDNISolone (MEDROL DOSEPAK) 4 MG TBPK tablet As directed  21 tablet 0  . predniSONE (DELTASONE) 10 MG tablet 40 mg x 3 days, 20 mg x 3 days, 10 mg x 3 days 21 tablet 0  . Zinc 50 MG CAPS 1 cap qd 100 capsule 3   No facility-administered medications prior to visit.    ROS: Review of Systems  Constitutional: Positive for fatigue and unexpected weight change. Negative for appetite change.  HENT: Negative for congestion, nosebleeds, sneezing, sore throat and trouble swallowing.   Eyes: Negative for itching and visual disturbance.  Respiratory: Negative for cough.   Cardiovascular: Negative for chest pain, palpitations and leg swelling.  Gastrointestinal: Positive for abdominal distention, abdominal pain and diarrhea. Negative for blood in stool and nausea.  Genitourinary: Negative for frequency and hematuria.  Musculoskeletal: Positive for back pain. Negative for gait problem, joint swelling and neck pain.  Skin: Negative for rash.  Neurological: Negative for dizziness, tremors, speech difficulty and weakness.  Psychiatric/Behavioral: Positive for decreased concentration and dysphoric mood. Negative for agitation, sleep disturbance and suicidal ideas. The patient is not nervous/anxious.     Objective:  BP 128/80 (BP Location: Left Arm)   Pulse 76   Temp 98.8 F (37.1 C) (Oral)   Ht 5\' 7"  (1.702 m)   Wt 249 lb (112.9 kg)   SpO2 99%   BMI 39.00 kg/m   BP Readings from Last 3 Encounters:  12/04/20 128/80  09/04/20 112/72  06/05/20 128/90    Wt Readings from Last 3 Encounters:  12/04/20 249 lb (112.9 kg)  09/04/20 231 lb 3.2 oz (104.9 kg)  06/19/20 220 lb (99.8 kg)    Physical Exam Constitutional:      General: He is not in acute distress.    Appearance: He is well-developed. He is obese.     Comments: NAD  Eyes:     Conjunctiva/sclera: Conjunctivae normal.     Pupils: Pupils are equal, round, and reactive to light.  Neck:     Thyroid: No thyromegaly.     Vascular: No JVD.  Cardiovascular:     Rate and Rhythm: Normal rate  and regular rhythm.     Heart sounds: Normal heart sounds. No murmur heard. No friction rub. No gallop.   Pulmonary:     Effort: Pulmonary effort is normal. No respiratory distress.     Breath sounds: Normal breath sounds. No wheezing or rales.  Chest:     Chest wall: No tenderness.  Abdominal:     General: Bowel sounds are normal. There is no distension.     Palpations: Abdomen is soft. There is no mass.     Tenderness: There is no abdominal tenderness. There is no guarding or rebound.  Musculoskeletal:        General: Tenderness present. Normal range of motion.  Cervical back: Normal range of motion.  Lymphadenopathy:     Cervical: No cervical adenopathy.  Skin:    General: Skin is warm and dry.     Findings: No rash.  Neurological:     Mental Status: He is alert and oriented to person, place, and time.     Cranial Nerves: No cranial nerve deficit.     Motor: No abnormal muscle tone.     Coordination: Coordination normal.     Gait: Gait normal.     Deep Tendon Reflexes: Reflexes are normal and symmetric.  Psychiatric:        Behavior: Behavior normal.        Thought Content: Thought content normal.        Judgment: Judgment normal.     Lab Results  Component Value Date   WBC 5.3 12/04/2019   HGB 14.0 12/04/2019   HCT 43.5 12/04/2019   PLT 240.0 12/04/2019   GLUCOSE 100 (H) 05/06/2020   CHOL 170 12/04/2019   TRIG 212.0 (H) 12/04/2019   HDL 41.00 12/04/2019   LDLDIRECT 91.0 12/04/2019   LDLCALC 114 (H) 11/28/2018   ALT 16 05/06/2020   AST 13 05/06/2020   NA 141 05/06/2020   K 4.9 05/06/2020   CL 106 05/06/2020   CREATININE 1.23 05/06/2020   BUN 18 05/06/2020   CO2 25 05/06/2020   TSH 2.90 12/04/2019   PSA 0.86 12/04/2019    CT ABDOMEN PELVIS W CONTRAST  Result Date: 07/24/2019 CLINICAL DATA:  Right lower quadrant pain for 3 days, constipation EXAM: CT ABDOMEN AND PELVIS WITH CONTRAST TECHNIQUE: Multidetector CT imaging of the abdomen and pelvis was  performed using the standard protocol following bolus administration of intravenous contrast. CONTRAST:  OMNIPAQUE IOHEXOL 300 MG/ML  SOLN COMPARISON:  CT abdomen pelvis 03/29/2017 FINDINGS: Lower chest: Basilar atelectatic changes. Lung bases otherwise clear. Normal heart size. No pericardial effusion. Hepatobiliary: No focal liver abnormality is seen. Prominent fold at the neck of the gallbladder. No calcified gallstones, pericholecystic inflammation or biliary ductal dilatation. Pancreas: Partial fatty replacement of the pancreas. No peripancreatic inflammation or ductal dilatation. Spleen: Normal in size without focal abnormality. Adrenals/Urinary Tract: Normal adrenal glands. 11 mm hypoattenuating simple appearing fluid attenuation cyst in the interpolar right kidney. Kidneys are otherwise unremarkable, without renal calculi, suspicious lesion, or hydronephrosis. Bladder is unremarkable. No extravasation of contrast is seen on excretory phase delayed imaging. Stomach/Bowel: Dilated air and fluid-filled appendix with peripheral hyperemia measuring up to 2.3 cm in diameter. Small amount extraluminal gas extending from the base of the appendix (3/70) as well as surrounding fluid and phlegmonous change. No discrete or organized abscess has formed at this time. The air is reactive thickening of the cecal tip as well as the adjacent ileum which courses in close proximity. No resulting obstruction in the inflamed portions of the bowel. Distal esophagus, stomach and more proximal small bowel are unremarkable. More distal colonic segments are free of acute abnormality. Vascular/Lymphatic: The aorta is normal caliber. Reactive adenopathy in the right lower quadrant. No suspicious or enlarged lymph nodes in the included lymphatic chains. Reproductive: The prostate and seminal vesicles are unremarkable. Other: There is extensive fluid and phlegmonous change centered upon the inflamed appendix in the right lower  quadrant. Additional intermediate attenuation (18-25 HU) fluid is seen in the low pelvis and left upper quadrant, likely redistributed. Additional foci of anti dependent gas are noted along the anterior peritoneal surface. Some peritoneal thickening is noted in the right and  left lower quadrants compatible with developing peritonitis. Musculoskeletal: No acute osseous abnormality or suspicious osseous lesion. IMPRESSION: 1. Perforated appendicitis with extensive fluid and phlegmonous change centered upon the inflamed appendix in the right lower quadrant. No discrete or organized abscess has formed at this time. Evidence of developing peritonitis. 2. Reactive thickening of the cecal tip as well as the adjacent ileum which courses in close proximity. No resulting obstruction in the inflamed portions of the bowel. Electronically Signed   By: Kreg ShropshirePrice  DeHay M.D.   On: 07/24/2019 19:39   DG Abd Portable 1 View  Result Date: 07/24/2019 CLINICAL DATA:  Abdomen pain EXAM: PORTABLE ABDOMEN - 1 VIEW COMPARISON:  CT abdomen pelvis March 29, 2017 FINDINGS: The bowel gas pattern is normal. No radio-opaque calculi or other significant radiographic abnormality are seen. IMPRESSION: No bowel obstruction. Electronically Signed   By: Sherian ReinWei-Chen  Lin M.D.   On: 07/24/2019 18:42    Assessment & Plan:    Sonda PrimesAlex Mercades Bajaj, MD

## 2020-12-04 NOTE — Assessment & Plan Note (Signed)
Continue with current therapy with Escitalopram.

## 2020-12-04 NOTE — Assessment & Plan Note (Signed)
Norco prn  Potential benefits of a long term opioids use as well as potential risks (i.e. addiction risk, apnea etc) and complications (i.e. Somnolence, constipation and others) were explained to the patient and were aknowledged. 

## 2020-12-04 NOTE — Assessment & Plan Note (Addendum)
Adderrall po It may be better lions mane

## 2020-12-04 NOTE — Assessment & Plan Note (Signed)
Potential benefits of a long term testosterone use as well as potential risks  and complications were explained to the patient and were aknowledged. On Testosterone IM

## 2020-12-04 NOTE — Assessment & Plan Note (Signed)
Continues to be a severe problem.  Discussed.  Lomotil as needed

## 2020-12-04 NOTE — Assessment & Plan Note (Signed)
Relapsing explosive diarrhea. Mark Mcgee is unable to travel [employer], work accommodations needed On gluten and milk free diet  IBS-D

## 2020-12-15 ENCOUNTER — Other Ambulatory Visit: Payer: Self-pay | Admitting: Internal Medicine

## 2020-12-15 MED FILL — ALPRAZOLAM 2 MG TABS: 2 | 30 days supply | Qty: 120 | Fill #1

## 2020-12-16 ENCOUNTER — Other Ambulatory Visit (HOSPITAL_BASED_OUTPATIENT_CLINIC_OR_DEPARTMENT_OTHER): Payer: Self-pay

## 2020-12-17 ENCOUNTER — Other Ambulatory Visit: Payer: Self-pay | Admitting: Internal Medicine

## 2020-12-18 MED FILL — AMPHETAMINE SALTS 10 MG TAB: 10 | 30 days supply | Qty: 60 | Fill #0

## 2020-12-18 MED FILL — HYDROCODON-APAP 10-325: 10-325 | 30 days supply | Qty: 120 | Fill #0

## 2020-12-18 MED FILL — AMLODIPINE-OLMESARTAN 10-40: 10-40 | 90 days supply | Qty: 90 | Fill #0

## 2021-01-12 DIAGNOSIS — I729 Aneurysm of unspecified site: Secondary | ICD-10-CM | POA: Diagnosis not present

## 2021-01-22 ENCOUNTER — Other Ambulatory Visit: Payer: Self-pay | Admitting: Internal Medicine

## 2021-01-22 ENCOUNTER — Other Ambulatory Visit (HOSPITAL_COMMUNITY): Payer: Self-pay

## 2021-01-22 MED ORDER — TADALAFIL 5 MG PO TABS
5.0000 mg | ORAL_TABLET | Freq: Every day | ORAL | 3 refills | Status: DC
  Filled 2021-04-08: qty 90, 90d supply, fill #0

## 2021-01-24 ENCOUNTER — Other Ambulatory Visit: Payer: Self-pay | Admitting: Internal Medicine

## 2021-01-24 ENCOUNTER — Other Ambulatory Visit (HOSPITAL_COMMUNITY): Payer: Self-pay

## 2021-01-26 ENCOUNTER — Other Ambulatory Visit (HOSPITAL_COMMUNITY): Payer: Self-pay

## 2021-01-26 MED ORDER — AMPHETAMINE-DEXTROAMPHETAMINE 10 MG PO TABS
ORAL_TABLET | ORAL | 0 refills | Status: DC
  Filled 2021-01-26: qty 60, 30d supply, fill #0

## 2021-01-26 MED ORDER — HYDROCODONE-ACETAMINOPHEN 10-325 MG PO TABS
1.0000 | ORAL_TABLET | Freq: Four times a day (QID) | ORAL | 0 refills | Status: DC | PRN
  Filled 2021-01-26: qty 120, 30d supply, fill #0

## 2021-01-26 MED ORDER — ALPRAZOLAM 2 MG PO TABS
ORAL_TABLET | Freq: Four times a day (QID) | ORAL | 1 refills | Status: DC | PRN
  Filled 2021-01-26: qty 120, 30d supply, fill #0
  Filled 2021-03-03: qty 120, 30d supply, fill #1

## 2021-01-27 ENCOUNTER — Other Ambulatory Visit (HOSPITAL_COMMUNITY): Payer: Self-pay

## 2021-01-28 ENCOUNTER — Other Ambulatory Visit (HOSPITAL_COMMUNITY): Payer: Self-pay

## 2021-02-09 DIAGNOSIS — E781 Pure hyperglyceridemia: Secondary | ICD-10-CM | POA: Diagnosis not present

## 2021-02-09 DIAGNOSIS — E669 Obesity, unspecified: Secondary | ICD-10-CM | POA: Diagnosis not present

## 2021-02-09 DIAGNOSIS — I10 Essential (primary) hypertension: Secondary | ICD-10-CM | POA: Diagnosis not present

## 2021-03-03 ENCOUNTER — Other Ambulatory Visit: Payer: Self-pay | Admitting: Internal Medicine

## 2021-03-03 ENCOUNTER — Other Ambulatory Visit (HOSPITAL_COMMUNITY): Payer: Self-pay

## 2021-03-05 ENCOUNTER — Other Ambulatory Visit (HOSPITAL_COMMUNITY): Payer: Self-pay

## 2021-03-06 ENCOUNTER — Other Ambulatory Visit (HOSPITAL_COMMUNITY): Payer: Self-pay

## 2021-03-09 ENCOUNTER — Other Ambulatory Visit: Payer: Self-pay

## 2021-03-09 ENCOUNTER — Ambulatory Visit: Payer: 59 | Admitting: Internal Medicine

## 2021-03-09 ENCOUNTER — Other Ambulatory Visit (HOSPITAL_COMMUNITY): Payer: Self-pay

## 2021-03-09 ENCOUNTER — Encounter: Payer: Self-pay | Admitting: Internal Medicine

## 2021-03-09 VITALS — BP 112/78 | HR 76 | Temp 98.4°F | Ht 67.0 in | Wt 237.0 lb

## 2021-03-09 DIAGNOSIS — F988 Other specified behavioral and emotional disorders with onset usually occurring in childhood and adolescence: Secondary | ICD-10-CM | POA: Diagnosis not present

## 2021-03-09 DIAGNOSIS — K580 Irritable bowel syndrome with diarrhea: Secondary | ICD-10-CM | POA: Diagnosis not present

## 2021-03-09 DIAGNOSIS — F418 Other specified anxiety disorders: Secondary | ICD-10-CM

## 2021-03-09 DIAGNOSIS — G8929 Other chronic pain: Secondary | ICD-10-CM

## 2021-03-09 DIAGNOSIS — M5440 Lumbago with sciatica, unspecified side: Secondary | ICD-10-CM

## 2021-03-09 DIAGNOSIS — Z0000 Encounter for general adult medical examination without abnormal findings: Secondary | ICD-10-CM

## 2021-03-09 LAB — COMPREHENSIVE METABOLIC PANEL
ALT: 41 U/L (ref 0–53)
AST: 24 U/L (ref 0–37)
Albumin: 4.9 g/dL (ref 3.5–5.2)
Alkaline Phosphatase: 55 U/L (ref 39–117)
BUN: 13 mg/dL (ref 6–23)
CO2: 26 mEq/L (ref 19–32)
Calcium: 9.6 mg/dL (ref 8.4–10.5)
Chloride: 103 mEq/L (ref 96–112)
Creatinine, Ser: 1.22 mg/dL (ref 0.40–1.50)
GFR: 67.49 mL/min (ref >60.00)
Glucose, Bld: 90 mg/dL (ref 70–99)
Potassium: 4.3 mEq/L (ref 3.5–5.1)
Sodium: 140 mEq/L (ref 135–145)
Total Bilirubin: 0.7 mg/dL (ref 0.2–1.2)
Total Protein: 7.1 g/dL (ref 6.0–8.3)

## 2021-03-09 LAB — HIV-1 RNA QUANT-NO REFLEX-BLD

## 2021-03-09 LAB — CBC WITH DIFFERENTIAL/PLATELET
Basophils Absolute: 0.1 10*3/uL (ref 0.0–0.1)
Basophils Relative: 1 % (ref 0.0–3.0)
Eosinophils Absolute: 0.2 10*3/uL (ref 0.0–0.7)
Eosinophils Relative: 3.4 % (ref 0.0–5.0)
HCT: 44.5 % (ref 39.0–52.0)
Hemoglobin: 14.3 g/dL (ref 13.0–17.0)
Lymphocytes Relative: 24.6 % (ref 12.0–46.0)
Lymphs Abs: 1.6 10*3/uL (ref 0.7–4.0)
MCHC: 32.1 g/dL (ref 30.0–36.0)
MCV: 79.5 fl (ref 78.0–100.0)
Monocytes Absolute: 0.6 10*3/uL (ref 0.1–1.0)
Monocytes Relative: 8.8 % (ref 3.0–12.0)
Neutro Abs: 4 10*3/uL (ref 1.4–7.7)
Neutrophils Relative %: 62.2 % (ref 43.0–77.0)
Platelets: 240 10*3/uL (ref 150.0–400.0)
RBC: 5.59 Mil/uL (ref 4.22–5.81)
RDW: 15.1 % (ref 11.5–15.5)
WBC: 6.4 10*3/uL (ref 4.0–10.5)

## 2021-03-09 LAB — PSA: PSA: 0.41 ng/mL (ref 0.10–4.00)

## 2021-03-09 LAB — LIPID PANEL
Cholesterol: 183 mg/dL (ref 0–200)
HDL: 52.2 mg/dL (ref >39.00)
LDL Cholesterol: 108 mg/dL — ABNORMAL HIGH (ref 0–99)
NonHDL: 130.83
Total CHOL/HDL Ratio: 4
Triglycerides: 112 mg/dL (ref 0.0–149.0)
VLDL: 22.4 mg/dL (ref 0.0–40.0)

## 2021-03-09 LAB — EXTRA SPECIMEN

## 2021-03-09 LAB — TSH: TSH: 4.94 u[IU]/mL — ABNORMAL HIGH (ref 0.35–4.50)

## 2021-03-09 MED ORDER — AMPHETAMINE-DEXTROAMPHETAMINE 10 MG PO TABS
ORAL_TABLET | ORAL | 0 refills | Status: DC
  Filled 2021-03-09: qty 60, 30d supply, fill #0

## 2021-03-09 MED ORDER — HYDROCODONE-ACETAMINOPHEN 10-325 MG PO TABS
1.0000 | ORAL_TABLET | Freq: Four times a day (QID) | ORAL | 0 refills | Status: DC | PRN
  Filled 2021-03-09: qty 120, 30d supply, fill #0

## 2021-03-09 MED ORDER — AMLODIPINE-OLMESARTAN 10-40 MG PO TABS
1.0000 | ORAL_TABLET | Freq: Every day | ORAL | 3 refills | Status: DC
  Filled 2021-03-09: qty 90, 90d supply, fill #0
  Filled 2021-10-14: qty 90, 90d supply, fill #1

## 2021-03-09 NOTE — Assessment & Plan Note (Addendum)
Chronic sx's - refractory Try CBD gummies

## 2021-03-09 NOTE — Assessment & Plan Note (Signed)
Xanax prn  Potential benefits of a long term benzodiazepines  use as well as potential risks  and complications were explained to the patient and were aknowledged. 

## 2021-03-09 NOTE — Assessment & Plan Note (Signed)
Norco prn  Potential benefits of a long term opioids use as well as potential risks (i.e. addiction risk, apnea etc) and complications (i.e. Somnolence, constipation and others) were explained to the patient and were aknowledged. 

## 2021-03-09 NOTE — Assessment & Plan Note (Signed)
Adderall Lions mane  Potential benefits of a long term stimulants  use as well as potential risks  and complications were explained to the patient and were aknowledged. 

## 2021-03-09 NOTE — Progress Notes (Signed)
Subjective:  Patient ID: Mark Mcgee, male    DOB: July 06, 1966  Age: 55 y.o. MRN: [id number]  CC: Follow-up (3 month f/u)   HPI Mark Mcgee presents for LBP, depression, diarrhea  Outpatient Medications Prior to Visit  Medication Sig Dispense Refill   acetaminophen (TYLENOL) 325 MG tablet Take 325-650 mg by mouth every 6 (six) hours as needed for mild pain or headache.      acyclovir (ZOVIRAX) 400 MG tablet Take 1 tablet (400 mg total) by mouth 4 (four) times daily as needed (as directed for outbreaks). 60 tablet 3   albuterol (VENTOLIN HFA) 108 (90 Base) MCG/ACT inhaler Inhale 2 puffs into the lungs every 4 (four) hours as needed for wheezing or shortness of breath. 18 g 5   alprazolam (XANAX) 2 MG tablet TAKE 1 TABLET BY MOUTH 4 TIMES DAILY AS NEEDED 120 tablet 1   amLODipine-olmesartan (AZOR) 10-40 MG tablet TAKE 1 TABLET BY MOUTH ONCE DAILY 90 tablet 3   amphetamine-dextroamphetamine (ADDERALL) 10 MG tablet TAKE 1 TABLET BY MOUTH 2 TIMES DAILY WITH BREAKFAST AND LUNCH 60 tablet 0   b complex vitamins tablet Take 1 tablet by mouth daily.     Cholecalciferol (VITAMIN D3) 50 MCG (2000 UT) TABS Take 2,000 Units by mouth daily with breakfast.     diphenhydrAMINE (BENADRYL) 25 mg capsule Take 25 mg by mouth every 6 (six) hours as needed (for seasonal allergies).     diphenoxylate-atropine (LOMOTIL) 2.5-0.025 MG tablet TAKE 1 OR 2 TABLETS BY MOUTH 4 TIMES DAILY AS NEEDED FOR DIARRHEA OR LOOSE STOOLS MAX OF 8 TABLETS PER DAY (Patient taking differently: Take 1-2 tablets by mouth 4 (four) times daily as needed for diarrhea or loose stools (MAX OF 8 TABLETS/24 HOURS).) 60 tablet 3   Flaxseed, Linseed, (FLAX SEEDS) POWD 2 (two) times daily as needed (as directed when mixing into health shakes).     glycopyrrolate (ROBINUL) 2 MG tablet Take 1 tablet (2 mg total) by mouth 2 (two) times daily. (Patient taking differently: Take 2 mg by mouth 2 (two) times daily as needed (spasms).) 180 tablet 0    HYDROcodone-acetaminophen (NORCO) 10-325 MG tablet TAKE 1 TABLET BY MOUTH EVERY 6 HOURS AS NEEDED FOR PAIN 120 tablet 0   ipratropium (ATROVENT) 0.03 % nasal spray Place 2 sprays into both nostrils 2 (two) times daily. (Patient taking differently: Place 2 sprays into both nostrils 2 (two) times daily as needed (for seasonal allergies).) 30 mL 5   loperamide (IMODIUM) 2 MG capsule Take 2 mg by mouth as needed for diarrhea or loose stools.     Multiple Vitamin (MULTIVITAMIN) capsule Take 1 capsule by mouth daily.      ondansetron (ZOFRAN) 4 MG tablet Take 1 tablet (4 mg total) by mouth every 8 (eight) hours as needed for nausea or vomiting. 12 tablet 0   SYRINGE-NEEDLE, DISP, 3 ML 25G X 1" 3 ML MISC USE AS DIRECTED FOR INJECTIONS 50 each 5   tadalafil (CIALIS) 5 MG tablet Take 1 tablet (5 mg total) by mouth daily. 90 tablet 3   tadalafil (CIALIS) 5 MG tablet Take 1 tablet (5 mg total) by mouth daily. 90 tablet 3   testosterone cypionate (DEPOTESTOSTERONE CYPIONATE) 200 MG/ML injection INJECT 1 ML INTO THE MUSCLE EVERY 14 DAYS 10 mL 5   triamcinolone ointment (KENALOG) 0.1 % Apply 1 application topically 2 (two) times daily. (Patient taking differently: Apply 1 application topically 2 (two) times daily as needed (for rashes).)  80 g 2   zinc gluconate 50 MG tablet Take 100 mg by mouth daily.     benzonatate (TESSALON) 200 MG capsule Take 1 capsule (200 mg total) by mouth 2 (two) times daily as needed for cough. (Patient not taking: Reported on 03/09/2021) 20 capsule 0   halobetasol (ULTRAVATE) 0.05 % ointment Apply topically 2 (two) times daily. (Patient taking differently: Apply 1 application topically 2 (two) times daily as needed (for rashes).) 50 g 3   No facility-administered medications prior to visit.    ROS: Review of Systems  Constitutional:  Positive for fatigue and unexpected weight change. Negative for appetite change.  HENT:  Negative for congestion, nosebleeds, sneezing, sore throat  and trouble swallowing.   Eyes:  Negative for itching and visual disturbance.  Respiratory:  Negative for cough.   Cardiovascular:  Negative for chest pain, palpitations and leg swelling.  Gastrointestinal:  Positive for abdominal pain and diarrhea. Negative for abdominal distention, blood in stool, nausea and vomiting.  Genitourinary:  Negative for frequency and hematuria.  Musculoskeletal:  Positive for arthralgias and back pain. Negative for gait problem, joint swelling and neck pain.  Skin:  Negative for rash.  Neurological:  Negative for dizziness, tremors, speech difficulty and weakness.  Psychiatric/Behavioral:  Positive for decreased concentration and dysphoric mood. Negative for agitation, sleep disturbance and suicidal ideas. The patient is nervous/anxious.    Objective:  BP 112/78 (BP Location: Left Arm)   Pulse 76   Temp 98.4 F (36.9 C) (Oral)   Ht 5\' 7"  (1.702 m)   Wt 237 lb (107.5 kg)   SpO2 95%   BMI 37.12 kg/m   BP Readings from Last 3 Encounters:  03/09/21 112/78  12/04/20 128/80  09/04/20 112/72    Wt Readings from Last 3 Encounters:  03/09/21 237 lb (107.5 kg)  12/04/20 249 lb (112.9 kg)  09/04/20 231 lb 3.2 oz (104.9 kg)    Physical Exam Constitutional:      General: He is not in acute distress.    Appearance: He is well-developed. He is obese.     Comments: NAD  Eyes:     Conjunctiva/sclera: Conjunctivae normal.     Pupils: Pupils are equal, round, and reactive to light.  Neck:     Thyroid: No thyromegaly.     Vascular: No JVD.  Cardiovascular:     Rate and Rhythm: Normal rate and regular rhythm.     Heart sounds: Normal heart sounds. No murmur heard.   No friction rub. No gallop.  Pulmonary:     Effort: Pulmonary effort is normal. No respiratory distress.     Breath sounds: Normal breath sounds. No wheezing or rales.  Chest:     Chest wall: No tenderness.  Abdominal:     General: Bowel sounds are normal. There is no distension.      Palpations: Abdomen is soft. There is no mass.     Tenderness: There is no abdominal tenderness. There is no guarding or rebound.  Musculoskeletal:        General: No tenderness. Normal range of motion.     Cervical back: Normal range of motion.  Lymphadenopathy:     Cervical: No cervical adenopathy.  Skin:    General: Skin is warm and dry.     Findings: No rash.  Neurological:     Mental Status: He is alert and oriented to person, place, and time.     Cranial Nerves: No cranial nerve deficit.  Motor: No abnormal muscle tone.     Coordination: Coordination normal.     Gait: Gait normal.     Deep Tendon Reflexes: Reflexes are normal and symmetric.  Psychiatric:        Behavior: Behavior normal.        Thought Content: Thought content normal.        Judgment: Judgment normal.    Lab Results  Component Value Date   WBC 5.3 12/04/2019   HGB 14.0 12/04/2019   HCT 43.5 12/04/2019   PLT 240.0 12/04/2019   GLUCOSE 100 (H) 05/06/2020   CHOL 170 12/04/2019   TRIG 212.0 (H) 12/04/2019   HDL 41.00 12/04/2019   LDLDIRECT 91.0 12/04/2019   LDLCALC 114 (H) 11/28/2018   ALT 16 05/06/2020   AST 13 05/06/2020   NA 141 05/06/2020   K 4.9 05/06/2020   CL 106 05/06/2020   CREATININE 1.23 05/06/2020   BUN 18 05/06/2020   CO2 25 05/06/2020   TSH 2.90 12/04/2019   PSA 0.86 12/04/2019    CT ABDOMEN PELVIS W CONTRAST  Result Date: 07/24/2019 CLINICAL DATA:  Right lower quadrant pain for 3 days, constipation EXAM: CT ABDOMEN AND PELVIS WITH CONTRAST TECHNIQUE: Multidetector CT imaging of the abdomen and pelvis was performed using the standard protocol following bolus administration of intravenous contrast. CONTRAST:  OMNIPAQUE IOHEXOL 300 MG/ML  SOLN COMPARISON:  CT abdomen pelvis 03/29/2017 FINDINGS: Lower chest: Basilar atelectatic changes. Lung bases otherwise clear. Normal heart size. No pericardial effusion. Hepatobiliary: No focal liver abnormality is seen. Prominent fold at the  neck of the gallbladder. No calcified gallstones, pericholecystic inflammation or biliary ductal dilatation. Pancreas: Partial fatty replacement of the pancreas. No peripancreatic inflammation or ductal dilatation. Spleen: Normal in size without focal abnormality. Adrenals/Urinary Tract: Normal adrenal glands. 11 mm hypoattenuating simple appearing fluid attenuation cyst in the interpolar right kidney. Kidneys are otherwise unremarkable, without renal calculi, suspicious lesion, or hydronephrosis. Bladder is unremarkable. No extravasation of contrast is seen on excretory phase delayed imaging. Stomach/Bowel: Dilated air and fluid-filled appendix with peripheral hyperemia measuring up to 2.3 cm in diameter. Small amount extraluminal gas extending from the base of the appendix (3/70) as well as surrounding fluid and phlegmonous change. No discrete or organized abscess has formed at this time. The air is reactive thickening of the cecal tip as well as the adjacent ileum which courses in close proximity. No resulting obstruction in the inflamed portions of the bowel. Distal esophagus, stomach and more proximal small bowel are unremarkable. More distal colonic segments are free of acute abnormality. Vascular/Lymphatic: The aorta is normal caliber. Reactive adenopathy in the right lower quadrant. No suspicious or enlarged lymph nodes in the included lymphatic chains. Reproductive: The prostate and seminal vesicles are unremarkable. Other: There is extensive fluid and phlegmonous change centered upon the inflamed appendix in the right lower quadrant. Additional intermediate attenuation (18-25 HU) fluid is seen in the low pelvis and left upper quadrant, likely redistributed. Additional foci of anti dependent gas are noted along the anterior peritoneal surface. Some peritoneal thickening is noted in the right and left lower quadrants compatible with developing peritonitis. Musculoskeletal: No acute osseous abnormality or  suspicious osseous lesion. IMPRESSION: 1. Perforated appendicitis with extensive fluid and phlegmonous change centered upon the inflamed appendix in the right lower quadrant. No discrete or organized abscess has formed at this time. Evidence of developing peritonitis. 2. Reactive thickening of the cecal tip as well as the adjacent ileum which courses in close  proximity. No resulting obstruction in the inflamed portions of the bowel. Electronically Signed   By: Kreg ShropshirePrice  DeHay M.D.   On: 07/24/2019 19:39   DG Abd Portable 1 View  Result Date: 07/24/2019 CLINICAL DATA:  Abdomen pain EXAM: PORTABLE ABDOMEN - 1 VIEW COMPARISON:  CT abdomen pelvis March 29, 2017 FINDINGS: The bowel gas pattern is normal. No radio-opaque calculi or other significant radiographic abnormality are seen. IMPRESSION: No bowel obstruction. Electronically Signed   By: Sherian ReinWei-Chen  Lin M.D.   On: 07/24/2019 18:42    Assessment & Plan:     Sonda PrimesAlex Khaliel Morey, MD

## 2021-03-10 ENCOUNTER — Other Ambulatory Visit: Payer: Self-pay | Admitting: Internal Medicine

## 2021-03-10 DIAGNOSIS — R7989 Other specified abnormal findings of blood chemistry: Secondary | ICD-10-CM

## 2021-03-10 DIAGNOSIS — I10 Essential (primary) hypertension: Secondary | ICD-10-CM | POA: Diagnosis not present

## 2021-03-10 DIAGNOSIS — E669 Obesity, unspecified: Secondary | ICD-10-CM | POA: Diagnosis not present

## 2021-03-10 DIAGNOSIS — E291 Testicular hypofunction: Secondary | ICD-10-CM

## 2021-03-10 DIAGNOSIS — E781 Pure hyperglyceridemia: Secondary | ICD-10-CM | POA: Diagnosis not present

## 2021-03-10 NOTE — Progress Notes (Signed)
Hiv rna

## 2021-03-13 ENCOUNTER — Other Ambulatory Visit (HOSPITAL_COMMUNITY): Payer: Self-pay

## 2021-03-24 ENCOUNTER — Telehealth: Payer: Self-pay | Admitting: Internal Medicine

## 2021-03-24 NOTE — Telephone Encounter (Signed)
Type of form received: FMLA  Additional comments:   Received by: Flonnie Hailstone  Form should be Faxed to: [phone]  Form should be mailed to:  n/a  Is patient requesting call for pickup:    Form placed in the Provider's box.  Attach charge sheet.  Provider will determine charge  Was patient informed of  7-10 business day turn around (Y/N)? no

## 2021-03-25 NOTE — Telephone Encounter (Signed)
Completed form placed on MD desk to review & sign ( In purple folder).Marland KitchenRaechel Chute

## 2021-04-02 NOTE — Telephone Encounter (Signed)
Yes.. forms was faxed bacl to [employer] and pt notified to pick-up.Mark KitchenRaechel Chute

## 2021-04-02 NOTE — Telephone Encounter (Signed)
Mark Mcgee, Has it been done? Thanks 

## 2021-04-08 ENCOUNTER — Other Ambulatory Visit: Payer: Self-pay | Admitting: Internal Medicine

## 2021-04-08 ENCOUNTER — Other Ambulatory Visit (HOSPITAL_COMMUNITY): Payer: Self-pay

## 2021-04-09 ENCOUNTER — Other Ambulatory Visit (HOSPITAL_COMMUNITY): Payer: Self-pay

## 2021-04-13 MED ORDER — HYDROCODONE-ACETAMINOPHEN 10-325 MG PO TABS
1.0000 | ORAL_TABLET | Freq: Four times a day (QID) | ORAL | 0 refills | Status: DC | PRN
  Filled 2021-04-13: qty 120, 30d supply, fill #0

## 2021-04-13 MED ORDER — ALPRAZOLAM 2 MG PO TABS
ORAL_TABLET | Freq: Four times a day (QID) | ORAL | 1 refills | Status: DC | PRN
  Filled 2021-04-13: qty 120, 30d supply, fill #0
  Filled 2021-05-21: qty 120, 30d supply, fill #1

## 2021-04-13 MED ORDER — AMPHETAMINE-DEXTROAMPHETAMINE 10 MG PO TABS
ORAL_TABLET | ORAL | 0 refills | Status: DC
  Filled 2021-04-13: qty 60, 30d supply, fill #0

## 2021-04-14 ENCOUNTER — Other Ambulatory Visit (HOSPITAL_COMMUNITY): Payer: Self-pay

## 2021-04-15 ENCOUNTER — Other Ambulatory Visit (HOSPITAL_COMMUNITY): Payer: Self-pay

## 2021-05-06 DIAGNOSIS — E669 Obesity, unspecified: Secondary | ICD-10-CM | POA: Diagnosis not present

## 2021-05-06 DIAGNOSIS — I10 Essential (primary) hypertension: Secondary | ICD-10-CM | POA: Diagnosis not present

## 2021-05-21 ENCOUNTER — Other Ambulatory Visit: Payer: Self-pay | Admitting: Internal Medicine

## 2021-05-21 ENCOUNTER — Other Ambulatory Visit (HOSPITAL_COMMUNITY): Payer: Self-pay

## 2021-05-22 ENCOUNTER — Other Ambulatory Visit (HOSPITAL_COMMUNITY): Payer: Self-pay

## 2021-05-25 ENCOUNTER — Other Ambulatory Visit (HOSPITAL_COMMUNITY): Payer: Self-pay

## 2021-05-25 MED ORDER — AMPHETAMINE-DEXTROAMPHETAMINE 10 MG PO TABS
ORAL_TABLET | ORAL | 0 refills | Status: DC
  Filled 2021-05-25: qty 60, 30d supply, fill #0

## 2021-05-25 MED ORDER — HYDROCODONE-ACETAMINOPHEN 10-325 MG PO TABS
1.0000 | ORAL_TABLET | Freq: Four times a day (QID) | ORAL | 0 refills | Status: DC | PRN
  Filled 2021-05-25: qty 120, 30d supply, fill #0

## 2021-06-03 DIAGNOSIS — E669 Obesity, unspecified: Secondary | ICD-10-CM | POA: Diagnosis not present

## 2021-06-03 DIAGNOSIS — I10 Essential (primary) hypertension: Secondary | ICD-10-CM | POA: Diagnosis not present

## 2021-06-09 ENCOUNTER — Other Ambulatory Visit: Payer: Self-pay

## 2021-06-09 ENCOUNTER — Encounter: Payer: Self-pay | Admitting: Internal Medicine

## 2021-06-09 ENCOUNTER — Ambulatory Visit: Payer: 59 | Admitting: Internal Medicine

## 2021-06-09 DIAGNOSIS — Z23 Encounter for immunization: Secondary | ICD-10-CM | POA: Diagnosis not present

## 2021-06-09 DIAGNOSIS — R7989 Other specified abnormal findings of blood chemistry: Secondary | ICD-10-CM | POA: Diagnosis not present

## 2021-06-09 DIAGNOSIS — R634 Abnormal weight loss: Secondary | ICD-10-CM | POA: Diagnosis not present

## 2021-06-09 DIAGNOSIS — F988 Other specified behavioral and emotional disorders with onset usually occurring in childhood and adolescence: Secondary | ICD-10-CM

## 2021-06-09 DIAGNOSIS — R197 Diarrhea, unspecified: Secondary | ICD-10-CM | POA: Diagnosis not present

## 2021-06-09 DIAGNOSIS — E291 Testicular hypofunction: Secondary | ICD-10-CM | POA: Diagnosis not present

## 2021-06-09 DIAGNOSIS — K580 Irritable bowel syndrome with diarrhea: Secondary | ICD-10-CM

## 2021-06-09 LAB — COMPREHENSIVE METABOLIC PANEL
ALT: 29 U/L (ref 0–53)
AST: 16 U/L (ref 0–37)
Albumin: 4.7 g/dL (ref 3.5–5.2)
Alkaline Phosphatase: 53 U/L (ref 39–117)
BUN: 15 mg/dL (ref 6–23)
CO2: 28 mEq/L (ref 19–32)
Calcium: 9.9 mg/dL (ref 8.4–10.5)
Chloride: 103 mEq/L (ref 96–112)
Creatinine, Ser: 1.2 mg/dL (ref 0.40–1.50)
GFR: 68.72 mL/min (ref >60.00)
Glucose, Bld: 94 mg/dL (ref 70–99)
Potassium: 4.7 mEq/L (ref 3.5–5.1)
Sodium: 140 mEq/L (ref 135–145)
Total Bilirubin: 0.7 mg/dL (ref 0.2–1.2)
Total Protein: 7.3 g/dL (ref 6.0–8.3)

## 2021-06-09 LAB — TESTOSTERONE: Testosterone: 232.02 ng/dL — ABNORMAL LOW (ref 300.00–890.00)

## 2021-06-09 LAB — TSH: TSH: 3.27 u[IU]/mL (ref 0.35–5.50)

## 2021-06-09 LAB — T4, FREE: Free T4: 0.92 ng/dL (ref 0.60–1.60)

## 2021-06-09 NOTE — Assessment & Plan Note (Signed)
Adderall Lions mane

## 2021-06-09 NOTE — Progress Notes (Signed)
Subjective:  Patient ID: Mark Mcgee, male    DOB: July 31, 1966  Age: 55 y.o. MRN: [id number]  CC: Follow-up (3 month f/u- flu shot)   HPI FATEH KINDLE presents for IBS-D, LBP, anxiety, depression, ADD Abd pain is better  Outpatient Medications Prior to Visit  Medication Sig Dispense Refill   acetaminophen (TYLENOL) 325 MG tablet Take 325-650 mg by mouth every 6 (six) hours as needed for mild pain or headache.      acyclovir (ZOVIRAX) 400 MG tablet Take 1 tablet (400 mg total) by mouth 4 (four) times daily as needed (as directed for outbreaks). 60 tablet 3   albuterol (VENTOLIN HFA) 108 (90 Base) MCG/ACT inhaler Inhale 2 puffs into the lungs every 4 (four) hours as needed for wheezing or shortness of breath. 18 g 5   alprazolam (XANAX) 2 MG tablet TAKE 1 TABLET BY MOUTH 4 TIMES DAILY AS NEEDED 120 tablet 1   amLODipine-olmesartan (AZOR) 10-40 MG tablet TAKE 1 TABLET BY MOUTH ONCE DAILY 90 tablet 3   amphetamine-dextroamphetamine (ADDERALL) 10 MG tablet TAKE 1 TABLET BY MOUTH 2 TIMES DAILY WITH BREAKFAST AND LUNCH 60 tablet 0   b complex vitamins tablet Take 1 tablet by mouth daily.     Cholecalciferol (VITAMIN D3) 50 MCG (2000 UT) TABS Take 2,000 Units by mouth daily with breakfast.     diphenhydrAMINE (BENADRYL) 25 mg capsule Take 25 mg by mouth every 6 (six) hours as needed (for seasonal allergies).     diphenoxylate-atropine (LOMOTIL) 2.5-0.025 MG tablet TAKE 1 OR 2 TABLETS BY MOUTH 4 TIMES DAILY AS NEEDED FOR DIARRHEA OR LOOSE STOOLS MAX OF 8 TABLETS PER DAY (Patient taking differently: Take 1-2 tablets by mouth 4 (four) times daily as needed for diarrhea or loose stools (MAX OF 8 TABLETS/24 HOURS).) 60 tablet 3   Flaxseed, Linseed, (FLAX SEEDS) POWD 2 (two) times daily as needed (as directed when mixing into health shakes).     glycopyrrolate (ROBINUL) 2 MG tablet Take 1 tablet (2 mg total) by mouth 2 (two) times daily. (Patient taking differently: Take 2 mg by mouth 2 (two) times  daily as needed (spasms).) 180 tablet 0   HYDROcodone-acetaminophen (NORCO) 10-325 MG tablet TAKE 1 TABLET BY MOUTH EVERY 6 HOURS AS NEEDED FOR PAIN 120 tablet 0   ipratropium (ATROVENT) 0.03 % nasal spray Place 2 sprays into both nostrils 2 (two) times daily. (Patient taking differently: Place 2 sprays into both nostrils 2 (two) times daily as needed (for seasonal allergies).) 30 mL 5   loperamide (IMODIUM) 2 MG capsule Take 2 mg by mouth as needed for diarrhea or loose stools.     Multiple Vitamin (MULTIVITAMIN) capsule Take 1 capsule by mouth daily.      SYRINGE-NEEDLE, DISP, 3 ML 25G X 1" 3 ML MISC USE AS DIRECTED FOR INJECTIONS 50 each 5   tadalafil (CIALIS) 5 MG tablet Take 1 tablet (5 mg total) by mouth daily. 90 tablet 3   tadalafil (CIALIS) 5 MG tablet Take 1 tablet (5 mg total) by mouth daily. 90 tablet 3   triamcinolone ointment (KENALOG) 0.1 % Apply 1 application topically 2 (two) times daily. (Patient taking differently: Apply 1 application topically 2 (two) times daily as needed (for rashes).) 80 g 2   zinc gluconate 50 MG tablet Take 100 mg by mouth daily.     testosterone cypionate (DEPOTESTOSTERONE CYPIONATE) 200 MG/ML injection INJECT 1 ML INTO THE MUSCLE EVERY 14 DAYS 10 mL 5  ondansetron (ZOFRAN) 4 MG tablet Take 1 tablet (4 mg total) by mouth every 8 (eight) hours as needed for nausea or vomiting. (Patient not taking: Reported on 06/09/2021) 12 tablet 0   No facility-administered medications prior to visit.    ROS: Review of Systems  Constitutional:  Negative for appetite change, fatigue and unexpected weight change.  HENT:  Negative for congestion, nosebleeds, sneezing, sore throat and trouble swallowing.   Eyes:  Negative for itching and visual disturbance.  Respiratory:  Negative for cough.   Cardiovascular:  Negative for chest pain, palpitations and leg swelling.  Gastrointestinal:  Negative for abdominal distention, blood in stool, diarrhea and nausea.   Genitourinary:  Negative for frequency and hematuria.  Musculoskeletal:  Negative for back pain, gait problem, joint swelling and neck pain.  Skin:  Negative for rash.  Neurological:  Negative for dizziness, tremors, speech difficulty and weakness.  Psychiatric/Behavioral:  Negative for agitation, dysphoric mood, sleep disturbance and suicidal ideas. The patient is not nervous/anxious.    Objective:  BP 120/82 (BP Location: Left Arm)   Pulse 86   Temp 98.5 F (36.9 C) (Oral)   Ht 5\' 7"  (1.702 m)   Wt 215 lb 9.6 oz (97.8 kg)   SpO2 97%   BMI 33.77 kg/m   BP Readings from Last 3 Encounters:  06/09/21 120/82  03/09/21 112/78  12/04/20 128/80    Wt Readings from Last 3 Encounters:  06/09/21 215 lb 9.6 oz (97.8 kg)  03/09/21 237 lb (107.5 kg)  12/04/20 249 lb (112.9 kg)    Physical Exam Constitutional:      General: He is not in acute distress.    Appearance: He is well-developed.     Comments: NAD  Eyes:     Conjunctiva/sclera: Conjunctivae normal.     Pupils: Pupils are equal, round, and reactive to light.  Neck:     Thyroid: No thyromegaly.     Vascular: No JVD.  Cardiovascular:     Rate and Rhythm: Normal rate and regular rhythm.     Heart sounds: Normal heart sounds. No murmur heard.   No friction rub. No gallop.  Pulmonary:     Effort: Pulmonary effort is normal. No respiratory distress.     Breath sounds: Normal breath sounds. No wheezing or rales.  Chest:     Chest wall: No tenderness.  Abdominal:     General: Bowel sounds are normal. There is no distension.     Palpations: Abdomen is soft. There is no mass.     Tenderness: There is no abdominal tenderness. There is no guarding or rebound.  Musculoskeletal:        General: No tenderness. Normal range of motion.     Cervical back: Normal range of motion.  Lymphadenopathy:     Cervical: No cervical adenopathy.  Skin:    General: Skin is warm and dry.     Findings: No rash.  Neurological:     Mental  Status: He is alert and oriented to person, place, and time.     Cranial Nerves: No cranial nerve deficit.     Motor: No abnormal muscle tone.     Coordination: Coordination normal.     Gait: Gait normal.     Deep Tendon Reflexes: Reflexes are normal and symmetric.  Psychiatric:        Behavior: Behavior normal.        Thought Content: Thought content normal.        Judgment: Judgment normal.  Lab Results  Component Value Date   WBC 6.4 03/09/2021   HGB 14.3 03/09/2021   HCT 44.5 03/09/2021   PLT 240.0 03/09/2021   GLUCOSE 90 03/09/2021   CHOL 183 03/09/2021   TRIG 112.0 03/09/2021   HDL 52.20 03/09/2021   LDLDIRECT 91.0 12/04/2019   LDLCALC 108 (H) 03/09/2021   ALT 41 03/09/2021   AST 24 03/09/2021   NA 140 03/09/2021   K 4.3 03/09/2021   CL 103 03/09/2021   CREATININE 1.22 03/09/2021   BUN 13 03/09/2021   CO2 26 03/09/2021   TSH 4.94 (H) 03/09/2021   PSA 0.41 03/09/2021    CT ABDOMEN PELVIS W CONTRAST  Result Date: 07/24/2019 CLINICAL DATA:  Right lower quadrant pain for 3 days, constipation EXAM: CT ABDOMEN AND PELVIS WITH CONTRAST TECHNIQUE: Multidetector CT imaging of the abdomen and pelvis was performed using the standard protocol following bolus administration of intravenous contrast. CONTRAST:  OMNIPAQUE IOHEXOL 300 MG/ML  SOLN COMPARISON:  CT abdomen pelvis 03/29/2017 FINDINGS: Lower chest: Basilar atelectatic changes. Lung bases otherwise clear. Normal heart size. No pericardial effusion. Hepatobiliary: No focal liver abnormality is seen. Prominent fold at the neck of the gallbladder. No calcified gallstones, pericholecystic inflammation or biliary ductal dilatation. Pancreas: Partial fatty replacement of the pancreas. No peripancreatic inflammation or ductal dilatation. Spleen: Normal in size without focal abnormality. Adrenals/Urinary Tract: Normal adrenal glands. 11 mm hypoattenuating simple appearing fluid attenuation cyst in the interpolar right kidney.  Kidneys are otherwise unremarkable, without renal calculi, suspicious lesion, or hydronephrosis. Bladder is unremarkable. No extravasation of contrast is seen on excretory phase delayed imaging. Stomach/Bowel: Dilated air and fluid-filled appendix with peripheral hyperemia measuring up to 2.3 cm in diameter. Small amount extraluminal gas extending from the base of the appendix (3/70) as well as surrounding fluid and phlegmonous change. No discrete or organized abscess has formed at this time. The air is reactive thickening of the cecal tip as well as the adjacent ileum which courses in close proximity. No resulting obstruction in the inflamed portions of the bowel. Distal esophagus, stomach and more proximal small bowel are unremarkable. More distal colonic segments are free of acute abnormality. Vascular/Lymphatic: The aorta is normal caliber. Reactive adenopathy in the right lower quadrant. No suspicious or enlarged lymph nodes in the included lymphatic chains. Reproductive: The prostate and seminal vesicles are unremarkable. Other: There is extensive fluid and phlegmonous change centered upon the inflamed appendix in the right lower quadrant. Additional intermediate attenuation (18-25 HU) fluid is seen in the low pelvis and left upper quadrant, likely redistributed. Additional foci of anti dependent gas are noted along the anterior peritoneal surface. Some peritoneal thickening is noted in the right and left lower quadrants compatible with developing peritonitis. Musculoskeletal: No acute osseous abnormality or suspicious osseous lesion. IMPRESSION: 1. Perforated appendicitis with extensive fluid and phlegmonous change centered upon the inflamed appendix in the right lower quadrant. No discrete or organized abscess has formed at this time. Evidence of developing peritonitis. 2. Reactive thickening of the cecal tip as well as the adjacent ileum which courses in close proximity. No resulting obstruction in the  inflamed portions of the bowel. Electronically Signed   By: Kreg Shropshire M.D.   On: 07/24/2019 19:39   DG Abd Portable 1 View  Result Date: 07/24/2019 CLINICAL DATA:  Abdomen pain EXAM: PORTABLE ABDOMEN - 1 VIEW COMPARISON:  CT abdomen pelvis March 29, 2017 FINDINGS: The bowel gas pattern is normal. No radio-opaque calculi or other significant radiographic  abnormality are seen. IMPRESSION: No bowel obstruction. Electronically Signed   By: Sherian Rein M.D.   On: 07/24/2019 18:42    Assessment & Plan:    Sonda Primes, MD

## 2021-06-09 NOTE — Addendum Note (Signed)
Addended by: Miguel Aschoff on: 06/09/2021 09:07 AM   Modules accepted: Orders

## 2021-06-09 NOTE — Assessment & Plan Note (Signed)
Wt Readings from Last 3 Encounters:  06/09/21 215 lb 9.6 oz (97.8 kg)  03/09/21 237 lb (107.5 kg)  12/04/20 249 lb (112.9 kg)

## 2021-06-09 NOTE — Assessment & Plan Note (Signed)
Explosive and recurrent Taking Lomotil prn

## 2021-06-09 NOTE — Assessment & Plan Note (Signed)
Better on Betain, Lion's mane

## 2021-06-10 ENCOUNTER — Encounter: Payer: Self-pay | Admitting: *Deleted

## 2021-06-11 LAB — HIV-1 RNA QUANT-NO REFLEX-BLD
HIV 1 RNA Quant: NOT DETECTED Copies/mL
HIV-1 RNA Quant, Log: NOT DETECTED Log cps/mL

## 2021-06-25 ENCOUNTER — Other Ambulatory Visit: Payer: Self-pay | Admitting: Internal Medicine

## 2021-06-25 ENCOUNTER — Other Ambulatory Visit (HOSPITAL_COMMUNITY): Payer: Self-pay

## 2021-06-26 ENCOUNTER — Other Ambulatory Visit (HOSPITAL_COMMUNITY): Payer: Self-pay

## 2021-06-26 MED ORDER — ALPRAZOLAM 2 MG PO TABS
ORAL_TABLET | Freq: Four times a day (QID) | ORAL | 1 refills | Status: DC | PRN
  Filled 2021-06-26: qty 120, 30d supply, fill #0
  Filled 2021-08-04: qty 120, 30d supply, fill #1

## 2021-06-26 MED ORDER — HYDROCODONE-ACETAMINOPHEN 10-325 MG PO TABS
1.0000 | ORAL_TABLET | Freq: Four times a day (QID) | ORAL | 0 refills | Status: DC | PRN
  Filled 2021-06-26: qty 120, 30d supply, fill #0

## 2021-06-26 MED ORDER — AMPHETAMINE-DEXTROAMPHETAMINE 10 MG PO TABS
ORAL_TABLET | ORAL | 0 refills | Status: DC
  Filled 2021-06-26: qty 60, 30d supply, fill #0

## 2021-06-29 ENCOUNTER — Other Ambulatory Visit (HOSPITAL_COMMUNITY): Payer: Self-pay

## 2021-07-01 DIAGNOSIS — E781 Pure hyperglyceridemia: Secondary | ICD-10-CM | POA: Diagnosis not present

## 2021-07-01 DIAGNOSIS — I10 Essential (primary) hypertension: Secondary | ICD-10-CM | POA: Diagnosis not present

## 2021-07-01 DIAGNOSIS — E669 Obesity, unspecified: Secondary | ICD-10-CM | POA: Diagnosis not present

## 2021-07-16 DIAGNOSIS — H5212 Myopia, left eye: Secondary | ICD-10-CM | POA: Diagnosis not present

## 2021-07-20 ENCOUNTER — Other Ambulatory Visit (HOSPITAL_COMMUNITY): Payer: Self-pay

## 2021-07-29 DIAGNOSIS — E669 Obesity, unspecified: Secondary | ICD-10-CM | POA: Diagnosis not present

## 2021-07-29 DIAGNOSIS — I10 Essential (primary) hypertension: Secondary | ICD-10-CM | POA: Diagnosis not present

## 2021-08-04 ENCOUNTER — Other Ambulatory Visit: Payer: Self-pay | Admitting: Internal Medicine

## 2021-08-04 ENCOUNTER — Other Ambulatory Visit (HOSPITAL_COMMUNITY): Payer: Self-pay

## 2021-08-04 MED ORDER — TADALAFIL 5 MG PO TABS
5.0000 mg | ORAL_TABLET | Freq: Every day | ORAL | 3 refills | Status: DC
  Filled 2021-08-04: qty 90, 90d supply, fill #0
  Filled 2021-12-24: qty 90, 90d supply, fill #1
  Filled 2022-04-18: qty 90, 90d supply, fill #2
  Filled 2022-06-24: qty 90, 90d supply, fill #3

## 2021-08-06 ENCOUNTER — Other Ambulatory Visit (HOSPITAL_COMMUNITY): Payer: Self-pay

## 2021-08-06 MED FILL — Amphetamine-Dextroamphetamine Tab 10 MG: ORAL | 30 days supply | Qty: 60 | Fill #0 | Status: AC

## 2021-08-06 MED FILL — Hydrocodone-Acetaminophen Tab 10-325 MG: ORAL | 30 days supply | Qty: 120 | Fill #0 | Status: AC

## 2021-08-07 ENCOUNTER — Other Ambulatory Visit (HOSPITAL_COMMUNITY): Payer: Self-pay

## 2021-08-27 DIAGNOSIS — E669 Obesity, unspecified: Secondary | ICD-10-CM | POA: Diagnosis not present

## 2021-08-27 DIAGNOSIS — E781 Pure hyperglyceridemia: Secondary | ICD-10-CM | POA: Diagnosis not present

## 2021-08-27 DIAGNOSIS — I10 Essential (primary) hypertension: Secondary | ICD-10-CM | POA: Diagnosis not present

## 2021-09-08 ENCOUNTER — Encounter: Payer: Self-pay | Admitting: Internal Medicine

## 2021-09-08 ENCOUNTER — Telehealth (INDEPENDENT_AMBULATORY_CARE_PROVIDER_SITE_OTHER): Payer: 59 | Admitting: Internal Medicine

## 2021-09-08 DIAGNOSIS — J4521 Mild intermittent asthma with (acute) exacerbation: Secondary | ICD-10-CM

## 2021-09-08 DIAGNOSIS — R5382 Chronic fatigue, unspecified: Secondary | ICD-10-CM

## 2021-09-08 DIAGNOSIS — J069 Acute upper respiratory infection, unspecified: Secondary | ICD-10-CM | POA: Diagnosis not present

## 2021-09-08 MED ORDER — ALBUTEROL SULFATE HFA 108 (90 BASE) MCG/ACT IN AERS
2.0000 | INHALATION_SPRAY | RESPIRATORY_TRACT | 5 refills | Status: AC | PRN
  Filled 2021-12-24: qty 18, 15d supply, fill #0
  Filled 2022-02-04: qty 18, 15d supply, fill #1

## 2021-09-08 MED ORDER — LEVOFLOXACIN 500 MG PO TABS
500.0000 mg | ORAL_TABLET | Freq: Every day | ORAL | 0 refills | Status: DC
Start: 2021-09-08 — End: 2022-02-24

## 2021-09-08 MED ORDER — METHYLPREDNISOLONE 4 MG PO TBPK: ORAL_TABLET | ORAL | 0 refills | Status: DC

## 2021-09-08 MED ORDER — PROMETHAZINE-DM 6.25-15 MG/5ML PO SYRP: 5.0000 mL | ORAL_SOLUTION | Freq: Four times a day (QID) | ORAL | 0 refills | Status: DC | PRN

## 2021-09-08 NOTE — Assessment & Plan Note (Signed)
Worse due to acute respiratory illness.  Possible pneumonia.  Stay at home.  Rest.  Hydrate well

## 2021-09-08 NOTE — Progress Notes (Signed)
Virtual Visit via Video Note  I connected with Mark Mcgee on 09/08/21 at  8:50 AM EST by a video enabled telemedicine application and verified that I am speaking with the correct person using two identifiers.   I discussed the limitations of evaluation and management by telemedicine and the availability of in person appointments. The patient expressed understanding and agreed to proceed.  I was located at our [facility] office. The patient was at home. There was no one else present in the visit.  No chief complaint on file.    History of Present Illness: Mark Mcgee is complaining of being sick with a cold for 2 days.  Today he woke up very sick with a fever of 101.3, chest pain with breathing, shortness of breath, weakness, pains all over.  Cough is productive for brownish sputum.  Review of Systems  Constitutional:  Positive for chills, fever and malaise/fatigue.  HENT:  Positive for congestion, sinus pain and sore throat.   Respiratory:  Positive for cough, sputum production and shortness of breath.   Cardiovascular:  Positive for chest pain.  Gastrointestinal:  Positive for diarrhea.  Genitourinary:  Negative for dysuria.  Musculoskeletal:  Positive for back pain, joint pain and myalgias.  Neurological:  Positive for weakness.  Psychiatric/Behavioral:  Negative for memory loss.     Observations/Objective: The patient appears tired.  He is in bed.  Coughing all the time during the visit.  He is not visibly short of breath  Assessment and Plan:  Problem List Items Addressed This Visit     Asthmatic bronchitis    Exacerbation due to upper respiratory tract infection.  Start Medrol pack, use Ventolin HFA every 4 hours.      Relevant Medications   methylPREDNISolone (MEDROL DOSEPAK) 4 MG TBPK tablet   albuterol (VENTOLIN HFA) 108 (90 Base) MCG/ACT inhaler   Chronic fatigue    Worse due to acute respiratory illness.  Possible pneumonia.  Stay at home.  Rest.  Hydrate well       URI (upper respiratory infection)    New.  CAP versus asthmatic bronchitis.  We will treat with Levaquin 500 mg daily for 10 days.  Promethazine cough syrup every 4 hours as needed.  Call if worse.  Test for COVID        Meds ordered this encounter  Medications   promethazine-dextromethorphan (PROMETHAZINE-DM) 6.25-15 MG/5ML syrup    Sig: Take 5 mLs by mouth 4 (four) times daily as needed for cough.    Dispense:  240 mL    Refill:  0   levofloxacin (LEVAQUIN) 500 MG tablet    Sig: Take 1 tablet (500 mg total) by mouth daily.    Dispense:  10 tablet    Refill:  0   methylPREDNISolone (MEDROL DOSEPAK) 4 MG TBPK tablet    Sig: As directed    Dispense:  21 tablet    Refill:  0   albuterol (VENTOLIN HFA) 108 (90 Base) MCG/ACT inhaler    Sig: Inhale 2 puffs into the lungs every 4 (four) hours as needed for wheezing or shortness of breath.    Dispense:  18 g    Refill:  5     Follow Up Instructions:    I discussed the assessment and treatment plan with the patient. The patient was provided an opportunity to ask questions and all were answered. The patient agreed with the plan and demonstrated an understanding of the instructions.   The patient was advised to call  back or seek an in-person evaluation if the symptoms worsen or if the condition fails to improve as anticipated.  I provided face-to-face time during this encounter. We were at different locations.   Sonda Primes, MD

## 2021-09-08 NOTE — Assessment & Plan Note (Signed)
New.  CAP versus asthmatic bronchitis.  We will treat with Levaquin 500 mg daily for 10 days.  Promethazine cough syrup every 4 hours as needed.  Call if worse.  Test for COVID

## 2021-09-08 NOTE — Assessment & Plan Note (Signed)
Exacerbation due to upper respiratory tract infection.  Start Medrol pack, use Ventolin HFA every 4 hours.

## 2021-09-09 ENCOUNTER — Encounter: Payer: Self-pay | Admitting: Internal Medicine

## 2021-09-11 ENCOUNTER — Other Ambulatory Visit: Payer: Self-pay | Admitting: Internal Medicine

## 2021-09-11 ENCOUNTER — Other Ambulatory Visit (HOSPITAL_COMMUNITY): Payer: Self-pay

## 2021-09-11 MED ORDER — HYDROCODONE-ACETAMINOPHEN 10-325 MG PO TABS
1.0000 | ORAL_TABLET | Freq: Four times a day (QID) | ORAL | 0 refills | Status: DC | PRN
  Filled 2021-09-11: qty 120, 30d supply, fill #0

## 2021-09-11 MED ORDER — ALPRAZOLAM 2 MG PO TABS
ORAL_TABLET | Freq: Four times a day (QID) | ORAL | 1 refills | Status: DC | PRN
  Filled 2021-09-11: qty 120, 30d supply, fill #0
  Filled 2021-10-14: qty 120, 30d supply, fill #1

## 2021-09-14 ENCOUNTER — Other Ambulatory Visit (HOSPITAL_COMMUNITY): Payer: Self-pay

## 2021-09-15 NOTE — Telephone Encounter (Signed)
Added dates to [facility].. place in purple folder for MD to sign.Marland KitchenRaechel Chute

## 2021-10-07 ENCOUNTER — Other Ambulatory Visit (HOSPITAL_COMMUNITY): Payer: Self-pay

## 2021-10-14 ENCOUNTER — Other Ambulatory Visit (HOSPITAL_COMMUNITY): Payer: Self-pay

## 2021-10-14 ENCOUNTER — Other Ambulatory Visit: Payer: Self-pay | Admitting: Internal Medicine

## 2021-10-15 ENCOUNTER — Other Ambulatory Visit (HOSPITAL_COMMUNITY): Payer: Self-pay

## 2021-10-15 MED ORDER — AMPHETAMINE-DEXTROAMPHETAMINE 10 MG PO TABS
ORAL_TABLET | ORAL | 0 refills | Status: DC
  Filled 2021-10-15: qty 60, 30d supply, fill #0

## 2021-10-15 MED ORDER — HYDROCODONE-ACETAMINOPHEN 10-325 MG PO TABS
1.0000 | ORAL_TABLET | Freq: Four times a day (QID) | ORAL | 0 refills | Status: DC | PRN
  Filled 2021-10-15: qty 120, 30d supply, fill #0

## 2021-10-16 ENCOUNTER — Other Ambulatory Visit (HOSPITAL_COMMUNITY): Payer: Self-pay

## 2021-11-10 DIAGNOSIS — E669 Obesity, unspecified: Secondary | ICD-10-CM | POA: Diagnosis not present

## 2021-11-10 DIAGNOSIS — I10 Essential (primary) hypertension: Secondary | ICD-10-CM | POA: Diagnosis not present

## 2021-11-10 DIAGNOSIS — E781 Pure hyperglyceridemia: Secondary | ICD-10-CM | POA: Diagnosis not present

## 2021-11-16 DIAGNOSIS — E785 Hyperlipidemia, unspecified: Secondary | ICD-10-CM | POA: Diagnosis not present

## 2021-11-16 DIAGNOSIS — R079 Chest pain, unspecified: Secondary | ICD-10-CM | POA: Diagnosis not present

## 2021-11-16 DIAGNOSIS — I10 Essential (primary) hypertension: Secondary | ICD-10-CM | POA: Diagnosis not present

## 2021-11-20 ENCOUNTER — Other Ambulatory Visit: Payer: Self-pay | Admitting: Internal Medicine

## 2021-11-20 ENCOUNTER — Other Ambulatory Visit (HOSPITAL_COMMUNITY): Payer: Self-pay

## 2021-11-20 DIAGNOSIS — I77810 Thoracic aortic ectasia: Secondary | ICD-10-CM | POA: Diagnosis not present

## 2021-11-20 DIAGNOSIS — I9589 Other hypotension: Secondary | ICD-10-CM | POA: Diagnosis not present

## 2021-11-20 DIAGNOSIS — F419 Anxiety disorder, unspecified: Secondary | ICD-10-CM | POA: Diagnosis not present

## 2021-11-20 DIAGNOSIS — J986 Disorders of diaphragm: Secondary | ICD-10-CM | POA: Diagnosis not present

## 2021-11-20 DIAGNOSIS — J189 Pneumonia, unspecified organism: Secondary | ICD-10-CM | POA: Diagnosis not present

## 2021-11-20 DIAGNOSIS — R531 Weakness: Secondary | ICD-10-CM | POA: Diagnosis not present

## 2021-11-20 DIAGNOSIS — Z87891 Personal history of nicotine dependence: Secondary | ICD-10-CM | POA: Diagnosis not present

## 2021-11-20 DIAGNOSIS — J90 Pleural effusion, not elsewhere classified: Secondary | ICD-10-CM | POA: Diagnosis not present

## 2021-11-20 DIAGNOSIS — Z01818 Encounter for other preprocedural examination: Secondary | ICD-10-CM | POA: Diagnosis not present

## 2021-11-20 DIAGNOSIS — I25110 Atherosclerotic heart disease of native coronary artery with unstable angina pectoris: Secondary | ICD-10-CM | POA: Diagnosis not present

## 2021-11-20 DIAGNOSIS — E1165 Type 2 diabetes mellitus with hyperglycemia: Secondary | ICD-10-CM | POA: Diagnosis not present

## 2021-11-20 DIAGNOSIS — G8918 Other acute postprocedural pain: Secondary | ICD-10-CM | POA: Diagnosis not present

## 2021-11-20 DIAGNOSIS — Z9114 Patient's other noncompliance with medication regimen: Secondary | ICD-10-CM | POA: Diagnosis not present

## 2021-11-20 DIAGNOSIS — Z95818 Presence of other cardiac implants and grafts: Secondary | ICD-10-CM | POA: Diagnosis not present

## 2021-11-20 DIAGNOSIS — Z9889 Other specified postprocedural states: Secondary | ICD-10-CM | POA: Diagnosis not present

## 2021-11-20 DIAGNOSIS — I2510 Atherosclerotic heart disease of native coronary artery without angina pectoris: Secondary | ICD-10-CM | POA: Diagnosis not present

## 2021-11-20 DIAGNOSIS — Z4682 Encounter for fitting and adjustment of non-vascular catheter: Secondary | ICD-10-CM | POA: Diagnosis not present

## 2021-11-20 DIAGNOSIS — J449 Chronic obstructive pulmonary disease, unspecified: Secondary | ICD-10-CM | POA: Diagnosis not present

## 2021-11-20 DIAGNOSIS — F4310 Post-traumatic stress disorder, unspecified: Secondary | ICD-10-CM | POA: Diagnosis not present

## 2021-11-20 DIAGNOSIS — F32A Depression, unspecified: Secondary | ICD-10-CM | POA: Diagnosis not present

## 2021-11-20 DIAGNOSIS — R079 Chest pain, unspecified: Secondary | ICD-10-CM | POA: Diagnosis not present

## 2021-11-20 DIAGNOSIS — I080 Rheumatic disorders of both mitral and aortic valves: Secondary | ICD-10-CM | POA: Diagnosis not present

## 2021-11-20 DIAGNOSIS — I7789 Other specified disorders of arteries and arterioles: Secondary | ICD-10-CM | POA: Diagnosis not present

## 2021-11-20 DIAGNOSIS — J9620 Acute and chronic respiratory failure, unspecified whether with hypoxia or hypercapnia: Secondary | ICD-10-CM | POA: Diagnosis not present

## 2021-11-20 DIAGNOSIS — J9811 Atelectasis: Secondary | ICD-10-CM | POA: Diagnosis not present

## 2021-11-20 MED ORDER — ALPRAZOLAM 2 MG PO TABS
ORAL_TABLET | Freq: Four times a day (QID) | ORAL | 1 refills | Status: DC | PRN
  Filled 2021-11-20: qty 120, 30d supply, fill #0
  Filled 2021-12-24: qty 120, 30d supply, fill #1

## 2021-11-20 MED ORDER — AMPHETAMINE-DEXTROAMPHETAMINE 10 MG PO TABS
ORAL_TABLET | ORAL | 0 refills | Status: DC
  Filled 2021-11-20: qty 60, 30d supply, fill #0

## 2021-11-20 MED ORDER — HYDROCODONE-ACETAMINOPHEN 10-325 MG PO TABS
1.0000 | ORAL_TABLET | Freq: Four times a day (QID) | ORAL | 0 refills | Status: DC | PRN
  Filled 2021-11-20: qty 120, 30d supply, fill #0

## 2021-11-20 MED ORDER — TESTOSTERONE CYPIONATE 200 MG/ML IM SOLN
INTRAMUSCULAR | 5 refills | Status: DC
  Filled 2021-11-20: qty 10, fill #0
  Filled 2022-03-10: qty 6, 84d supply, fill #0

## 2021-11-20 MED FILL — Syringe/Needle (Disp) 3 ML 25 x 1": Qty: 50 | Fill #0 | Status: CN

## 2021-11-21 ENCOUNTER — Other Ambulatory Visit (HOSPITAL_COMMUNITY): Payer: Self-pay

## 2021-11-21 DIAGNOSIS — I2510 Atherosclerotic heart disease of native coronary artery without angina pectoris: Secondary | ICD-10-CM | POA: Diagnosis not present

## 2021-11-21 DIAGNOSIS — R079 Chest pain, unspecified: Secondary | ICD-10-CM | POA: Diagnosis not present

## 2021-11-21 DIAGNOSIS — E1165 Type 2 diabetes mellitus with hyperglycemia: Secondary | ICD-10-CM | POA: Diagnosis not present

## 2021-11-22 DIAGNOSIS — E1165 Type 2 diabetes mellitus with hyperglycemia: Secondary | ICD-10-CM | POA: Diagnosis not present

## 2021-11-22 DIAGNOSIS — R079 Chest pain, unspecified: Secondary | ICD-10-CM | POA: Diagnosis not present

## 2021-11-22 DIAGNOSIS — I2510 Atherosclerotic heart disease of native coronary artery without angina pectoris: Secondary | ICD-10-CM | POA: Diagnosis not present

## 2021-11-23 DIAGNOSIS — E1165 Type 2 diabetes mellitus with hyperglycemia: Secondary | ICD-10-CM | POA: Diagnosis not present

## 2021-11-23 DIAGNOSIS — R079 Chest pain, unspecified: Secondary | ICD-10-CM | POA: Diagnosis not present

## 2021-11-23 DIAGNOSIS — I2510 Atherosclerotic heart disease of native coronary artery without angina pectoris: Secondary | ICD-10-CM | POA: Diagnosis not present

## 2021-11-24 DIAGNOSIS — I2510 Atherosclerotic heart disease of native coronary artery without angina pectoris: Secondary | ICD-10-CM | POA: Diagnosis not present

## 2021-11-24 DIAGNOSIS — J986 Disorders of diaphragm: Secondary | ICD-10-CM | POA: Diagnosis not present

## 2021-11-24 DIAGNOSIS — J189 Pneumonia, unspecified organism: Secondary | ICD-10-CM | POA: Diagnosis not present

## 2021-11-24 DIAGNOSIS — I7789 Other specified disorders of arteries and arterioles: Secondary | ICD-10-CM | POA: Diagnosis not present

## 2021-11-25 DIAGNOSIS — I25110 Atherosclerotic heart disease of native coronary artery with unstable angina pectoris: Secondary | ICD-10-CM | POA: Diagnosis not present

## 2021-11-28 DIAGNOSIS — Z9889 Other specified postprocedural states: Secondary | ICD-10-CM | POA: Diagnosis not present

## 2021-11-29 DIAGNOSIS — J90 Pleural effusion, not elsewhere classified: Secondary | ICD-10-CM | POA: Diagnosis not present

## 2021-11-29 DIAGNOSIS — Z4682 Encounter for fitting and adjustment of non-vascular catheter: Secondary | ICD-10-CM | POA: Diagnosis not present

## 2021-12-15 ENCOUNTER — Telehealth: Payer: Self-pay | Admitting: *Deleted

## 2021-12-15 NOTE — Chronic Care Management (AMB) (Signed)
?  Care Management  ? ?Note ? ?12/15/2021 ?Name: Mark Mcgee MRN: [id number] DOB: 1966/04/23 ? ?Mark Mcgee is a 56 y.o. year old male who is a primary care patient of Plotnikov, Georgina Quint, MD. I reached out to Luther Redo by phone today offer care coordination services.  ? ?[name] was given information about care management services today including:  ?Care management services include personalized support from designated clinical staff supervised by his physician, including individualized plan of care and coordination with other care providers ?24/7 contact phone numbers for assistance for urgent and routine care needs. ?The patient may stop care management services at any time by phone call to the office staff. ? ?Patient did not agree to enrollment in care management services and does not wish to consider at this time. Patient also stated "he had not been recently admitted to [facility] hospital that it is an error and his insurance and hospital are reviewing the claim".  ? ?Follow up plan: ?Patient declines engagement by the care management team. Appropriate care team members and provider have been notified via electronic communication. The care management team is available to follow up with the patient after provider conversation with the patient regarding recommendation for care management engagement and subsequent re-referral to the care management team.  ? ?Gwenevere Ghazi  ?Care Guide, Embedded Care Coordination ?[facility]  Care Management  ?Direct Dial: [phone] ? ?

## 2021-12-18 ENCOUNTER — Other Ambulatory Visit (HOSPITAL_COMMUNITY): Payer: Self-pay

## 2021-12-24 ENCOUNTER — Other Ambulatory Visit: Payer: Self-pay | Admitting: Internal Medicine

## 2021-12-24 ENCOUNTER — Other Ambulatory Visit (HOSPITAL_COMMUNITY): Payer: Self-pay

## 2021-12-25 ENCOUNTER — Other Ambulatory Visit (HOSPITAL_COMMUNITY): Payer: Self-pay

## 2021-12-26 ENCOUNTER — Other Ambulatory Visit (HOSPITAL_COMMUNITY): Payer: Self-pay

## 2021-12-28 NOTE — Telephone Encounter (Signed)
LOV: 06/09/21  & Virtual Visit on 09/08/21 ? ?

## 2021-12-31 ENCOUNTER — Other Ambulatory Visit (HOSPITAL_COMMUNITY): Payer: Self-pay

## 2021-12-31 MED ORDER — HYDROCODONE-ACETAMINOPHEN 10-325 MG PO TABS
1.0000 | ORAL_TABLET | Freq: Four times a day (QID) | ORAL | 0 refills | Status: DC | PRN
  Filled 2021-12-31: qty 120, 30d supply, fill #0

## 2021-12-31 MED ORDER — AMPHETAMINE-DEXTROAMPHETAMINE 10 MG PO TABS
ORAL_TABLET | ORAL | 0 refills | Status: DC
  Filled 2021-12-31: qty 60, 30d supply, fill #0

## 2022-02-04 ENCOUNTER — Other Ambulatory Visit (HOSPITAL_COMMUNITY): Payer: Self-pay

## 2022-02-04 ENCOUNTER — Other Ambulatory Visit: Payer: Self-pay | Admitting: Internal Medicine

## 2022-02-05 ENCOUNTER — Other Ambulatory Visit (HOSPITAL_COMMUNITY): Payer: Self-pay

## 2022-02-05 MED ORDER — ALPRAZOLAM 2 MG PO TABS
ORAL_TABLET | Freq: Four times a day (QID) | ORAL | 1 refills | Status: DC | PRN
  Filled 2022-02-05: qty 120, 30d supply, fill #0
  Filled 2022-03-10: qty 61, 15d supply, fill #1
  Filled 2022-03-12: qty 59, 15d supply, fill #1

## 2022-02-05 MED ORDER — HYDROCODONE-ACETAMINOPHEN 10-325 MG PO TABS
1.0000 | ORAL_TABLET | Freq: Four times a day (QID) | ORAL | 0 refills | Status: DC | PRN
  Filled 2022-02-05: qty 120, 30d supply, fill #0

## 2022-02-05 MED ORDER — AMPHETAMINE-DEXTROAMPHETAMINE 10 MG PO TABS
ORAL_TABLET | ORAL | 0 refills | Status: DC
  Filled 2022-02-05: qty 60, 30d supply, fill #0

## 2022-02-08 ENCOUNTER — Other Ambulatory Visit (HOSPITAL_COMMUNITY): Payer: Self-pay

## 2022-02-24 ENCOUNTER — Encounter: Payer: Self-pay | Admitting: Internal Medicine

## 2022-02-24 ENCOUNTER — Ambulatory Visit: Payer: 59 | Admitting: Internal Medicine

## 2022-02-24 ENCOUNTER — Other Ambulatory Visit (HOSPITAL_COMMUNITY): Payer: Self-pay

## 2022-02-24 VITALS — BP 130/82 | HR 71 | Temp 98.7°F | Ht 67.0 in | Wt 219.0 lb

## 2022-02-24 DIAGNOSIS — E291 Testicular hypofunction: Secondary | ICD-10-CM

## 2022-02-24 DIAGNOSIS — R197 Diarrhea, unspecified: Secondary | ICD-10-CM

## 2022-02-24 DIAGNOSIS — G8929 Other chronic pain: Secondary | ICD-10-CM

## 2022-02-24 DIAGNOSIS — R5382 Chronic fatigue, unspecified: Secondary | ICD-10-CM | POA: Diagnosis not present

## 2022-02-24 DIAGNOSIS — J4521 Mild intermittent asthma with (acute) exacerbation: Secondary | ICD-10-CM

## 2022-02-24 DIAGNOSIS — F410 Panic disorder [episodic paroxysmal anxiety] without agoraphobia: Secondary | ICD-10-CM

## 2022-02-24 DIAGNOSIS — R151 Fecal smearing: Secondary | ICD-10-CM

## 2022-02-24 DIAGNOSIS — I10 Essential (primary) hypertension: Secondary | ICD-10-CM

## 2022-02-24 DIAGNOSIS — F4323 Adjustment disorder with mixed anxiety and depressed mood: Secondary | ICD-10-CM | POA: Diagnosis not present

## 2022-02-24 DIAGNOSIS — M5440 Lumbago with sciatica, unspecified side: Secondary | ICD-10-CM

## 2022-02-24 DIAGNOSIS — F418 Other specified anxiety disorders: Secondary | ICD-10-CM | POA: Diagnosis not present

## 2022-02-24 LAB — TSH: TSH: 4.92 u[IU]/mL (ref 0.35–5.50)

## 2022-02-24 LAB — COMPREHENSIVE METABOLIC PANEL
ALT: 27 U/L (ref 0–53)
AST: 17 U/L (ref 0–37)
Albumin: 4.7 g/dL (ref 3.5–5.2)
Alkaline Phosphatase: 51 U/L (ref 39–117)
BUN: 17 mg/dL (ref 6–23)
CO2: 28 mEq/L (ref 19–32)
Calcium: 9.7 mg/dL (ref 8.4–10.5)
Chloride: 103 mEq/L (ref 96–112)
Creatinine, Ser: 1.26 mg/dL (ref 0.40–1.50)
GFR: 64.49 mL/min (ref >60.00)
Glucose, Bld: 95 mg/dL (ref 70–99)
Potassium: 4.3 mEq/L (ref 3.5–5.1)
Sodium: 141 mEq/L (ref 135–145)
Total Bilirubin: 0.9 mg/dL (ref 0.2–1.2)
Total Protein: 6.9 g/dL (ref 6.0–8.3)

## 2022-02-24 LAB — PSA: PSA: 0.43 ng/mL (ref 0.10–4.00)

## 2022-02-24 LAB — TESTOSTERONE: Testosterone: 191.96 ng/dL — ABNORMAL LOW (ref 300.00–890.00)

## 2022-02-24 MED ORDER — TRELEGY ELLIPTA 100-62.5-25 MCG/ACT IN AEPB
1.0000 | INHALATION_SPRAY | Freq: Every day | RESPIRATORY_TRACT | 11 refills | Status: AC
  Filled 2022-02-24: qty 60, 30d supply, fill #0
  Filled 2022-05-19: qty 60, 30d supply, fill #1
  Filled 2022-08-05 (×3): qty 60, 30d supply, fill #2
  Filled 2023-01-03: qty 60, 30d supply, fill #3

## 2022-02-24 NOTE — Assessment & Plan Note (Signed)
FMLA filled out

## 2022-02-24 NOTE — Progress Notes (Signed)
Subjective:  Patient ID: Mark Mcgee, male    DOB: [birth year]  Age: 56 y.o. MRN: [id number]  CC: No chief complaint on file.   HPI Mark Mcgee presents for LBP, ADD, CFS, chronic diarrhea/IBS-D C/o SOB attacks relieved w/Albuterol MDI  Outpatient Medications Prior to Visit  Medication Sig Dispense Refill   acetaminophen (TYLENOL) 325 MG tablet Take 325-650 mg by mouth every 6 (six) hours as needed for mild pain or headache.      acyclovir (ZOVIRAX) 400 MG tablet Take 1 tablet (400 mg total) by mouth 4 (four) times daily as needed (as directed for outbreaks). 60 tablet 3   albuterol (VENTOLIN HFA) 108 (90 Base) MCG/ACT inhaler Inhale 2 puffs into the lungs every 4 (four) hours as needed for wheezing or shortness of breath. 18 g 5   alprazolam (XANAX) 2 MG tablet TAKE 1 TABLET BY MOUTH 4 TIMES DAILY AS NEEDED 120 tablet 1   amLODipine-olmesartan (AZOR) 10-40 MG tablet TAKE 1 TABLET BY MOUTH ONCE DAILY 90 tablet 3   amphetamine-dextroamphetamine (ADDERALL) 10 MG tablet TAKE 1 TABLET BY MOUTH 2 TIMES DAILY WITH BREAKFAST AND LUNCH 60 tablet 0   b complex vitamins tablet Take 1 tablet by mouth daily.     Cholecalciferol (VITAMIN D3) 50 MCG (2000 UT) TABS Take 2,000 Units by mouth daily with breakfast.     diphenhydrAMINE (BENADRYL) 25 mg capsule Take 25 mg by mouth every 6 (six) hours as needed (for seasonal allergies).     diphenoxylate-atropine (LOMOTIL) 2.5-0.025 MG tablet TAKE 1 OR 2 TABLETS BY MOUTH 4 TIMES DAILY AS NEEDED FOR DIARRHEA OR LOOSE STOOLS MAX OF 8 TABLETS PER DAY (Patient taking differently: Take 1-2 tablets by mouth 4 (four) times daily as needed for diarrhea or loose stools (MAX OF 8 TABLETS/24 HOURS).) 60 tablet 3   Flaxseed, Linseed, (FLAX SEEDS) POWD 2 (two) times daily as needed (as directed when mixing into health shakes).     glycopyrrolate (ROBINUL) 2 MG tablet Take 1 tablet (2 mg total) by mouth 2 (two) times daily. (Patient taking differently: Take 2 mg by  mouth 2 (two) times daily as needed (spasms).) 180 tablet 0   HYDROcodone-acetaminophen (NORCO) 10-325 MG tablet TAKE 1 TABLET BY MOUTH EVERY 6 HOURS AS NEEDED FOR PAIN 120 tablet 0   ipratropium (ATROVENT) 0.03 % nasal spray Place 2 sprays into both nostrils 2 (two) times daily. (Patient taking differently: Place 2 sprays into both nostrils 2 (two) times daily as needed (for seasonal allergies).) 30 mL 5   loperamide (IMODIUM) 2 MG capsule Take 2 mg by mouth as needed for diarrhea or loose stools.     methylPREDNISolone (MEDROL DOSEPAK) 4 MG TBPK tablet As directed 21 tablet 0   Multiple Vitamin (MULTIVITAMIN) capsule Take 1 capsule by mouth daily.      promethazine-dextromethorphan (PROMETHAZINE-DM) 6.25-15 MG/5ML syrup Take 5 mLs by mouth 4 (four) times daily as needed for cough. 240 mL 0   tadalafil (CIALIS) 5 MG tablet Take 1 tablet (5 mg total) by mouth daily. 90 tablet 3   tadalafil (CIALIS) 5 MG tablet Take 1 tablet (5 mg total) by mouth daily. 90 tablet 3   testosterone cypionate (DEPOTESTOSTERONE CYPIONATE) 200 MG/ML injection INJECT 1 ML INTO THE MUSCLE EVERY 14 DAYS 10 mL 5   triamcinolone ointment (KENALOG) 0.1 % Apply 1 application topically 2 (two) times daily. (Patient taking differently: Apply 1 application. topically 2 (two) times daily as needed (for rashes).) 80 g  2   zinc gluconate 50 MG tablet Take 100 mg by mouth daily.     levofloxacin (LEVAQUIN) 500 MG tablet Take 1 tablet (500 mg total) by mouth daily. 10 tablet 0   No facility-administered medications prior to visit.    ROS: Review of Systems  Constitutional:  Positive for fatigue. Negative for appetite change and unexpected weight change.  HENT:  Negative for congestion, nosebleeds, sneezing, sore throat and trouble swallowing.   Eyes:  Negative for itching and visual disturbance.  Respiratory:  Positive for shortness of breath. Negative for cough.   Cardiovascular:  Negative for chest pain, palpitations and leg  swelling.  Gastrointestinal:  Positive for abdominal pain and diarrhea. Negative for abdominal distention, blood in stool and nausea.  Genitourinary:  Negative for frequency and hematuria.  Musculoskeletal:  Positive for back pain. Negative for gait problem, joint swelling and neck pain.  Skin:  Negative for rash.  Neurological:  Negative for dizziness, tremors, speech difficulty and weakness.  Hematological:  Does not bruise/bleed easily.  Psychiatric/Behavioral:  Positive for decreased concentration, dysphoric mood and sleep disturbance. Negative for agitation and suicidal ideas. The patient is nervous/anxious.    Objective:  BP 130/82 (BP Location: Left Arm, Patient Position: Sitting, Cuff Size: Large)   Pulse 71   Temp 98.7 F (37.1 C) (Oral)   Ht 5\' 7"  (1.702 m)   Wt 219 lb (99.3 kg)   SpO2 96%   BMI 34.30 kg/m   BP Readings from Last 3 Encounters:  02/24/22 130/82  06/09/21 120/82  03/09/21 112/78    Wt Readings from Last 3 Encounters:  02/24/22 219 lb (99.3 kg)  06/09/21 215 lb 9.6 oz (97.8 kg)  03/09/21 237 lb (107.5 kg)    Physical Exam Constitutional:      General: He is not in acute distress.    Appearance: He is well-developed. He is obese.     Comments: NAD  Eyes:     Conjunctiva/sclera: Conjunctivae normal.     Pupils: Pupils are equal, round, and reactive to light.  Neck:     Thyroid: No thyromegaly.     Vascular: No JVD.  Cardiovascular:     Rate and Rhythm: Normal rate and regular rhythm.     Heart sounds: Normal heart sounds. No murmur heard.   No friction rub. No gallop.  Pulmonary:     Effort: Pulmonary effort is normal. No respiratory distress.     Breath sounds: Normal breath sounds. No wheezing or rales.  Chest:     Chest wall: No tenderness.  Abdominal:     General: Bowel sounds are normal. There is no distension.     Palpations: Abdomen is soft. There is no mass.     Tenderness: There is no abdominal tenderness. There is no guarding or  rebound.  Musculoskeletal:        General: No tenderness. Normal range of motion.     Cervical back: Normal range of motion.  Lymphadenopathy:     Cervical: No cervical adenopathy.  Skin:    General: Skin is warm and dry.     Findings: No rash.  Neurological:     Mental Status: He is alert and oriented to person, place, and time.     Cranial Nerves: No cranial nerve deficit.     Motor: No abnormal muscle tone.     Coordination: Coordination normal.     Gait: Gait normal.     Deep Tendon Reflexes: Reflexes are normal and symmetric.  Psychiatric:        Behavior: Behavior normal.        Thought Content: Thought content normal.        Judgment: Judgment normal.    I personally provided Trelegy inhaler use teaching. After teaching the patient should be able to use effectively. All questions were answered      CT ABDOMEN PELVIS W CONTRAST  Result Date: 07/24/2019 CLINICAL DATA:  Right lower quadrant pain for 3 days, constipation EXAM: CT ABDOMEN AND PELVIS WITH CONTRAST TECHNIQUE: Multidetector CT imaging of the abdomen and pelvis was performed using the standard protocol following bolus administration of intravenous contrast. CONTRAST:  100mL OMNIPAQUE IOHEXOL 300 MG/ML  SOLN COMPARISON:  CT abdomen pelvis 03/29/2017 FINDINGS: Lower chest: Basilar atelectatic changes. Lung bases otherwise clear. Normal heart size. No pericardial effusion. Hepatobiliary: No focal liver abnormality is seen. Prominent fold at the neck of the gallbladder. No calcified gallstones, pericholecystic inflammation or biliary ductal dilatation. Pancreas: Partial fatty replacement of the pancreas. No peripancreatic inflammation or ductal dilatation. Spleen: Normal in size without focal abnormality. Adrenals/Urinary Tract: Normal adrenal glands. 11 mm hypoattenuating simple appearing fluid attenuation cyst in the interpolar right kidney. Kidneys are otherwise unremarkable, without renal calculi, suspicious lesion, or  hydronephrosis. Bladder is unremarkable. No extravasation of contrast is seen on excretory phase delayed imaging. Stomach/Bowel: Dilated air and fluid-filled appendix with peripheral hyperemia measuring up to 2.3 cm in diameter. Small amount extraluminal gas extending from the base of the appendix (3/70) as well as surrounding fluid and phlegmonous change. No discrete or organized abscess has formed at this time. The air is reactive thickening of the cecal tip as well as the adjacent ileum which courses in close proximity. No resulting obstruction in the inflamed portions of the bowel. Distal esophagus, stomach and more proximal small bowel are unremarkable. More distal colonic segments are free of acute abnormality. Vascular/Lymphatic: The aorta is normal caliber. Reactive adenopathy in the right lower quadrant. No suspicious or enlarged lymph nodes in the included lymphatic chains. Reproductive: The prostate and seminal vesicles are unremarkable. Other: There is extensive fluid and phlegmonous change centered upon the inflamed appendix in the right lower quadrant. Additional intermediate attenuation (18-25 HU) fluid is seen in the low pelvis and left upper quadrant, likely redistributed. Additional foci of anti dependent gas are noted along the anterior peritoneal surface. Some peritoneal thickening is noted in the right and left lower quadrants compatible with developing peritonitis. Musculoskeletal: No acute osseous abnormality or suspicious osseous lesion. IMPRESSION: 1. Perforated appendicitis with extensive fluid and phlegmonous change centered upon the inflamed appendix in the right lower quadrant. No discrete or organized abscess has formed at this time. Evidence of developing peritonitis. 2. Reactive thickening of the cecal tip as well as the adjacent ileum which courses in close proximity. No resulting obstruction in the inflamed portions of the bowel. Electronically Signed   By: Kreg ShropshirePrice  DeHay M.D.   On:  07/24/2019 19:39   DG Abd Portable 1 View  Result Date: 07/24/2019 CLINICAL DATA:  Abdomen pain EXAM: PORTABLE ABDOMEN - 1 VIEW COMPARISON:  CT abdomen pelvis March 29, 2017 FINDINGS: The bowel gas pattern is normal. No radio-opaque calculi or other significant radiographic abnormality are seen. IMPRESSION: No bowel obstruction. Electronically Signed   By: Sherian ReinWei-Chen  Lin M.D.   On: 07/24/2019 18:42    Assessment & Plan:   Problem List Items Addressed This Visit     Adjustment disorder with mixed anxiety and depressed mood  Xanax prn, Paxil      Anxiety disorder    Chronic  Xanax prn  Potential benefits of a long term benzodiazepines  use as well as potential risks  and complications were explained to the patient and were aknowledged.      Asthmatic bronchitis    Ventolin HFA, Medrol pack, Zpac, Tessalon      Relevant Medications   Fluticasone-Umeclidin-Vilant (TRELEGY ELLIPTA) 100-62.5-25 MCG/ACT AEPB   Chronic fatigue    Working 55-60 h/wk       Diarrhea    FMLA filled out       Relevant Orders   TSH   Comprehensive metabolic panel   Testosterone   HIV 1 RNA, Quant, no Reflex   HYPERTENSION    Chronic - cont on Azor      Hypogonadism male - Primary   Relevant Orders   Testosterone   PSA   Incontinence of feces    FMLA filled out       LOW BACK PAIN    Chronic Norco prn  Potential benefits of a long term opioids use as well as potential risks (i.e. addiction risk, apnea etc) and complications (i.e. Somnolence, constipation and others) were explained to the patient and were aknowledged.       PANIC DISORDER    Xanax prn  Potential benefits of a long term benzodiazepines  use as well as potential risks  and complications were explained to the patient and were aknowledged.         Meds ordered this encounter  Medications   Fluticasone-Umeclidin-Vilant (TRELEGY ELLIPTA) 100-62.5-25 MCG/ACT AEPB    Sig: Inhale 1 puff into the lungs daily.     Dispense:  60 each    Refill:  11      Follow-up: Return in about 3 months (around 05/27/2022) for a follow-up visit.  Sonda Primes, MD

## 2022-02-24 NOTE — Assessment & Plan Note (Signed)
Chronic - cont on Azor

## 2022-02-24 NOTE — Assessment & Plan Note (Signed)
Chronic Norco prn  Potential benefits of a long term opioids use as well as potential risks (i.e. addiction risk, apnea etc) and complications (i.e. Somnolence, constipation and others) were explained to the patient and were aknowledged. 

## 2022-02-24 NOTE — Assessment & Plan Note (Signed)
Xanax prn  Potential benefits of a long term benzodiazepines  use as well as potential risks  and complications were explained to the patient and were aknowledged. 

## 2022-02-24 NOTE — Assessment & Plan Note (Signed)
Working 55-60 h/wk

## 2022-02-24 NOTE — Assessment & Plan Note (Signed)
Chronic  Xanax prn  Potential benefits of a long term benzodiazepines  use as well as potential risks  and complications were explained to the patient and were aknowledged. 

## 2022-02-24 NOTE — Assessment & Plan Note (Signed)
Xanax prn, Paxil

## 2022-02-24 NOTE — Assessment & Plan Note (Signed)
Ventolin HFA, Medrol pack, Zpac, Tessalon

## 2022-02-25 ENCOUNTER — Encounter: Payer: Self-pay | Admitting: Internal Medicine

## 2022-02-27 ENCOUNTER — Encounter: Payer: Self-pay | Admitting: Internal Medicine

## 2022-02-28 LAB — HIV-1 RNA QUANT-NO REFLEX-BLD
HIV 1 RNA Quant: NOT DETECTED Copies/mL
HIV-1 RNA Quant, Log: NOT DETECTED Log cps/mL

## 2022-03-01 NOTE — Telephone Encounter (Signed)
Rec'd FMLA forms from [employer].. Completed forms w/ new date 03/26/22. Gave to MD to sign place in purple folder../l,mb

## 2022-03-10 ENCOUNTER — Other Ambulatory Visit (HOSPITAL_COMMUNITY): Payer: Self-pay

## 2022-03-10 ENCOUNTER — Other Ambulatory Visit: Payer: Self-pay | Admitting: Internal Medicine

## 2022-03-11 ENCOUNTER — Other Ambulatory Visit (HOSPITAL_COMMUNITY): Payer: Self-pay

## 2022-03-11 NOTE — Telephone Encounter (Signed)
Check [place] registry last filled 02/08/2022, and hydrocodone 02/05/22.../lm,b

## 2022-03-12 ENCOUNTER — Other Ambulatory Visit (HOSPITAL_COMMUNITY): Payer: Self-pay

## 2022-03-16 NOTE — Telephone Encounter (Signed)
Pt called in for a fallow up.  Please advise.

## 2022-03-18 ENCOUNTER — Other Ambulatory Visit (HOSPITAL_COMMUNITY): Payer: Self-pay

## 2022-03-18 MED ORDER — HYDROCODONE-ACETAMINOPHEN 10-325 MG PO TABS
1.0000 | ORAL_TABLET | Freq: Four times a day (QID) | ORAL | 0 refills | Status: DC | PRN
  Filled 2022-03-18: qty 120, 30d supply, fill #0

## 2022-03-18 MED ORDER — AMPHETAMINE-DEXTROAMPHETAMINE 10 MG PO TABS
ORAL_TABLET | ORAL | 0 refills | Status: DC
  Filled 2022-03-18: qty 60, 30d supply, fill #0

## 2022-03-18 MED ORDER — AMLODIPINE-OLMESARTAN 10-40 MG PO TABS
1.0000 | ORAL_TABLET | Freq: Every day | ORAL | 3 refills | Status: DC
  Filled 2022-03-18: qty 90, 90d supply, fill #0
  Filled 2022-04-18 – 2022-08-05 (×2): qty 90, 90d supply, fill #1
  Filled 2022-12-03: qty 90, 90d supply, fill #2
  Filled 2023-03-16: qty 90, 90d supply, fill #3

## 2022-03-29 ENCOUNTER — Encounter: Payer: Self-pay | Admitting: Internal Medicine

## 2022-03-29 NOTE — Telephone Encounter (Signed)
See previous msg address annual appt w/ pt..lmb

## 2022-04-18 ENCOUNTER — Other Ambulatory Visit: Payer: Self-pay | Admitting: Internal Medicine

## 2022-04-19 ENCOUNTER — Other Ambulatory Visit (HOSPITAL_COMMUNITY): Payer: Self-pay

## 2022-04-19 MED ORDER — AMPHETAMINE-DEXTROAMPHETAMINE 10 MG PO TABS
ORAL_TABLET | ORAL | 0 refills | Status: DC
  Filled 2022-04-19: qty 60, 30d supply, fill #0

## 2022-04-19 MED ORDER — ALPRAZOLAM 2 MG PO TABS
ORAL_TABLET | Freq: Four times a day (QID) | ORAL | 0 refills | Status: DC | PRN
  Filled 2022-04-19: qty 52, 13d supply, fill #0
  Filled 2022-04-20: qty 68, 17d supply, fill #0

## 2022-04-19 MED ORDER — HYDROCODONE-ACETAMINOPHEN 10-325 MG PO TABS
1.0000 | ORAL_TABLET | Freq: Four times a day (QID) | ORAL | 0 refills | Status: DC | PRN
  Filled 2022-04-19: qty 120, 30d supply, fill #0

## 2022-04-19 NOTE — Telephone Encounter (Signed)
Check [place] registry last filled both 03/18/2022.. MD is out of the office this week pls advise on refill./lmb

## 2022-04-19 NOTE — Telephone Encounter (Signed)
Noted.. Up front for pick-up.Marland KitchenRaechel Mcgee

## 2022-04-20 ENCOUNTER — Other Ambulatory Visit (HOSPITAL_COMMUNITY): Payer: Self-pay

## 2022-05-19 ENCOUNTER — Other Ambulatory Visit: Payer: Self-pay | Admitting: Internal Medicine

## 2022-05-19 NOTE — Telephone Encounter (Signed)
Ok to PCP please 

## 2022-05-20 ENCOUNTER — Other Ambulatory Visit (HOSPITAL_COMMUNITY): Payer: Self-pay

## 2022-05-20 MED ORDER — ALPRAZOLAM 2 MG PO TABS
1.0000 mg | ORAL_TABLET | Freq: Four times a day (QID) | ORAL | 1 refills | Status: DC | PRN
  Filled 2022-05-20: qty 120, 60d supply, fill #0
  Filled 2022-06-24 – 2022-07-07 (×2): qty 120, 60d supply, fill #1

## 2022-05-20 MED ORDER — HYDROCODONE-ACETAMINOPHEN 10-325 MG PO TABS
1.0000 | ORAL_TABLET | Freq: Four times a day (QID) | ORAL | 0 refills | Status: DC | PRN
  Filled 2022-05-20: qty 120, 30d supply, fill #0

## 2022-05-20 MED ORDER — AMPHETAMINE-DEXTROAMPHETAMINE 10 MG PO TABS
ORAL_TABLET | ORAL | 0 refills | Status: DC
  Filled 2022-05-20: qty 60, 30d supply, fill #0

## 2022-05-27 ENCOUNTER — Ambulatory Visit: Payer: 59 | Admitting: Internal Medicine

## 2022-05-27 ENCOUNTER — Other Ambulatory Visit (HOSPITAL_COMMUNITY): Payer: Self-pay

## 2022-05-27 ENCOUNTER — Encounter: Payer: Self-pay | Admitting: Internal Medicine

## 2022-05-27 VITALS — BP 132/84 | HR 74 | Temp 98.5°F | Ht 67.0 in | Wt 238.6 lb

## 2022-05-27 DIAGNOSIS — F988 Other specified behavioral and emotional disorders with onset usually occurring in childhood and adolescence: Secondary | ICD-10-CM | POA: Diagnosis not present

## 2022-05-27 DIAGNOSIS — F410 Panic disorder [episodic paroxysmal anxiety] without agoraphobia: Secondary | ICD-10-CM | POA: Diagnosis not present

## 2022-05-27 DIAGNOSIS — Z23 Encounter for immunization: Secondary | ICD-10-CM

## 2022-05-27 DIAGNOSIS — I10 Essential (primary) hypertension: Secondary | ICD-10-CM

## 2022-05-27 DIAGNOSIS — R197 Diarrhea, unspecified: Secondary | ICD-10-CM

## 2022-05-27 DIAGNOSIS — Z0000 Encounter for general adult medical examination without abnormal findings: Secondary | ICD-10-CM

## 2022-05-27 MED ORDER — BUDESONIDE ER 9 MG PO TB24
1.0000 | ORAL_TABLET | Freq: Every morning | ORAL | 3 refills | Status: DC
  Filled 2022-05-27: qty 30, 30d supply, fill #0

## 2022-05-27 NOTE — Assessment & Plan Note (Signed)
Adderall [place] mane  Potential benefits of a long term stimulants  use as well as potential risks  and complications were explained to the patient and were aknowledged.

## 2022-05-27 NOTE — Assessment & Plan Note (Addendum)
Not better  Will try empiric Entocort 9 mg q am  Potential benefits of a long term steroid  use as well as potential risks  and complications were explained to the patient and were aknowledged.

## 2022-05-27 NOTE — Progress Notes (Addendum)
Subjective:  Patient ID: Mark Mcgee, male    DOB: 10-24-1965  Age: 56 y.o. MRN: [id number]  CC: Annual Exam (Need physical for work)   HPI Mark Mcgee presents for diarrhea, LBP, depression, ADD Well exam  Outpatient Medications Prior to Visit  Medication Sig Dispense Refill   acetaminophen (TYLENOL) 325 MG tablet Take 325-650 mg by mouth every 6 (six) hours as needed for mild pain or headache.      acyclovir (ZOVIRAX) 400 MG tablet Take 1 tablet (400 mg total) by mouth 4 (four) times daily as needed (as directed for outbreaks). 60 tablet 3   albuterol (VENTOLIN HFA) 108 (90 Base) MCG/ACT inhaler Inhale 2 puffs into the lungs every 4 (four) hours as needed for wheezing or shortness of breath. 18 g 5   alprazolam (XANAX) 2 MG tablet Take 1/2 tablet by mouth 4 times daily as needed for sleep or anxiety. 120 tablet 1   amLODipine-olmesartan (AZOR) 10-40 MG tablet TAKE 1 TABLET BY MOUTH ONCE DAILY 90 tablet 3   amphetamine-dextroamphetamine (ADDERALL) 10 MG tablet TAKE 1 TABLET BY MOUTH 2 TIMES DAILY WITH BREAKFAST AND LUNCH 60 tablet 0   b complex vitamins tablet Take 1 tablet by mouth daily.     Cholecalciferol (VITAMIN D3) 50 MCG (2000 UT) TABS Take 2,000 Units by mouth daily with breakfast.     diphenhydrAMINE (BENADRYL) 25 mg capsule Take 25 mg by mouth every 6 (six) hours as needed (for seasonal allergies).     diphenoxylate-atropine (LOMOTIL) 2.5-0.025 MG tablet TAKE 1 OR 2 TABLETS BY MOUTH 4 TIMES DAILY AS NEEDED FOR DIARRHEA OR LOOSE STOOLS MAX OF 8 TABLETS PER DAY (Patient taking differently: Take 1-2 tablets by mouth 4 (four) times daily as needed for diarrhea or loose stools (MAX OF 8 TABLETS/24 HOURS).) 60 tablet 3   Flaxseed, Linseed, (FLAX SEEDS) POWD 2 (two) times daily as needed (as directed when mixing into health shakes).     Fluticasone-Umeclidin-Vilant (TRELEGY ELLIPTA) 100-62.5-25 MCG/ACT AEPB Inhale 1 puff into the lungs daily. 60 each 11   glycopyrrolate  (ROBINUL) 2 MG tablet Take 1 tablet (2 mg total) by mouth 2 (two) times daily. (Patient taking differently: Take 2 mg by mouth 2 (two) times daily as needed (spasms).) 180 tablet 0   HYDROcodone-acetaminophen (NORCO) 10-325 MG tablet TAKE 1 TABLET BY MOUTH EVERY 6 HOURS AS NEEDED FOR PAIN 120 tablet 0   ipratropium (ATROVENT) 0.03 % nasal spray Place 2 sprays into both nostrils 2 (two) times daily. (Patient taking differently: Place 2 sprays into both nostrils 2 (two) times daily as needed (for seasonal allergies).) 30 mL 5   loperamide (IMODIUM) 2 MG capsule Take 2 mg by mouth as needed for diarrhea or loose stools.     Multiple Vitamin (MULTIVITAMIN) capsule Take 1 capsule by mouth daily.      tadalafil (CIALIS) 5 MG tablet Take 1 tablet (5 mg total) by mouth daily. 90 tablet 3   testosterone cypionate (DEPOTESTOSTERONE CYPIONATE) 200 MG/ML injection INJECT 1 ML INTO THE MUSCLE EVERY 14 DAYS 10 mL 5   triamcinolone ointment (KENALOG) 0.1 % Apply 1 application topically 2 (two) times daily. (Patient taking differently: Apply 1 application  topically 2 (two) times daily as needed (for rashes).) 80 g 2   zinc gluconate 50 MG tablet Take 100 mg by mouth daily.     methylPREDNISolone (MEDROL DOSEPAK) 4 MG TBPK tablet As directed (Patient not taking: Reported on 05/27/2022) 21 tablet 0  promethazine-dextromethorphan (PROMETHAZINE-DM) 6.25-15 MG/5ML syrup Take 5 mLs by mouth 4 (four) times daily as needed for cough. (Patient not taking: Reported on 05/27/2022) 240 mL 0   tadalafil (CIALIS) 5 MG tablet Take 1 tablet (5 mg total) by mouth daily. (Patient not taking: Reported on 05/27/2022) 90 tablet 3   No facility-administered medications prior to visit.    ROS: Review of Systems  Constitutional:  Positive for fatigue and unexpected weight change. Negative for appetite change.  HENT:  Negative for congestion, nosebleeds, sneezing, sore throat and trouble swallowing.   Eyes:  Negative for itching and  visual disturbance.  Respiratory:  Negative for cough.   Cardiovascular:  Negative for chest pain, palpitations and leg swelling.  Gastrointestinal:  Positive for abdominal distention, abdominal pain and diarrhea. Negative for blood in stool and nausea.  Genitourinary:  Negative for frequency and hematuria.  Musculoskeletal:  Positive for arthralgias and back pain. Negative for gait problem, joint swelling and neck pain.  Skin:  Negative for rash.  Neurological:  Negative for dizziness, tremors, speech difficulty and weakness.  Psychiatric/Behavioral:  Positive for dysphoric mood and sleep disturbance. Negative for agitation and suicidal ideas. The patient is nervous/anxious.     Objective:  BP 132/84 (BP Location: Left Arm)   Pulse 74   Temp 98.5 F (36.9 C) (Oral)   Ht 5\' 7"  (1.702 m)   Wt 238 lb 9.6 oz (108.2 kg)   SpO2 97%   BMI 37.37 kg/m   BP Readings from Last 3 Encounters:  05/27/22 132/84  02/24/22 130/82  06/09/21 120/82    Wt Readings from Last 3 Encounters:  05/27/22 238 lb 9.6 oz (108.2 kg)  02/24/22 219 lb (99.3 kg)  06/09/21 215 lb 9.6 oz (97.8 kg)    Physical Exam Constitutional:      General: He is not in acute distress.    Appearance: Normal appearance. He is well-developed.     Comments: NAD  Eyes:     Conjunctiva/sclera: Conjunctivae normal.     Pupils: Pupils are equal, round, and reactive to light.  Neck:     Thyroid: No thyromegaly.     Vascular: No JVD.  Cardiovascular:     Rate and Rhythm: Normal rate and regular rhythm.     Heart sounds: Normal heart sounds. No murmur heard.    No friction rub. No gallop.  Pulmonary:     Effort: Pulmonary effort is normal. No respiratory distress.     Breath sounds: Normal breath sounds. No wheezing or rales.  Chest:     Chest wall: No tenderness.  Abdominal:     General: Bowel sounds are normal. There is no distension.     Palpations: Abdomen is soft. There is no mass.     Tenderness: There is no  abdominal tenderness. There is no guarding or rebound.  Musculoskeletal:        General: No tenderness. Normal range of motion.     Cervical back: Normal range of motion.  Lymphadenopathy:     Cervical: No cervical adenopathy.  Skin:    General: Skin is warm and dry.     Findings: No rash.  Neurological:     Mental Status: He is alert and oriented to person, place, and time.     Cranial Nerves: No cranial nerve deficit.     Motor: No abnormal muscle tone.     Coordination: Coordination normal.     Gait: Gait normal.     Deep Tendon Reflexes: Reflexes  are normal and symmetric.  Psychiatric:        Behavior: Behavior normal.        Thought Content: Thought content normal.        Judgment: Judgment normal.     Lab Results  Component Value Date   WBC 6.4 03/09/2021   HGB 14.3 03/09/2021   HCT 44.5 03/09/2021   PLT 240.0 03/09/2021   GLUCOSE 95 02/24/2022   CHOL 183 03/09/2021   TRIG 112.0 03/09/2021   HDL 52.20 03/09/2021   LDLDIRECT 91.0 12/04/2019   LDLCALC 108 (H) 03/09/2021   ALT 27 02/24/2022   AST 17 02/24/2022   NA 141 02/24/2022   K 4.3 02/24/2022   CL 103 02/24/2022   CREATININE 1.26 02/24/2022   BUN 17 02/24/2022   CO2 28 02/24/2022   TSH 4.92 02/24/2022   PSA 0.43 02/24/2022    CT ABDOMEN PELVIS W CONTRAST  Result Date: 07/24/2019 CLINICAL DATA:  Right lower quadrant pain for 3 days, constipation EXAM: CT ABDOMEN AND PELVIS WITH CONTRAST TECHNIQUE: Multidetector CT imaging of the abdomen and pelvis was performed using the standard protocol following bolus administration of intravenous contrast. CONTRAST:  OMNIPAQUE IOHEXOL 300 MG/ML  SOLN COMPARISON:  CT abdomen pelvis 03/29/2017 FINDINGS: Lower chest: Basilar atelectatic changes. Lung bases otherwise clear. Normal heart size. No pericardial effusion. Hepatobiliary: No focal liver abnormality is seen. Prominent fold at the neck of the gallbladder. No calcified gallstones, pericholecystic inflammation or  biliary ductal dilatation. Pancreas: Partial fatty replacement of the pancreas. No peripancreatic inflammation or ductal dilatation. Spleen: Normal in size without focal abnormality. Adrenals/Urinary Tract: Normal adrenal glands. 11 mm hypoattenuating simple appearing fluid attenuation cyst in the interpolar right kidney. Kidneys are otherwise unremarkable, without renal calculi, suspicious lesion, or hydronephrosis. Bladder is unremarkable. No extravasation of contrast is seen on excretory phase delayed imaging. Stomach/Bowel: Dilated air and fluid-filled appendix with peripheral hyperemia measuring up to 2.3 cm in diameter. Small amount extraluminal gas extending from the base of the appendix (3/70) as well as surrounding fluid and phlegmonous change. No discrete or organized abscess has formed at this time. The air is reactive thickening of the cecal tip as well as the adjacent ileum which courses in close proximity. No resulting obstruction in the inflamed portions of the bowel. Distal esophagus, stomach and more proximal small bowel are unremarkable. More distal colonic segments are free of acute abnormality. Vascular/Lymphatic: The aorta is normal caliber. Reactive adenopathy in the right lower quadrant. No suspicious or enlarged lymph nodes in the included lymphatic chains. Reproductive: The prostate and seminal vesicles are unremarkable. Other: There is extensive fluid and phlegmonous change centered upon the inflamed appendix in the right lower quadrant. Additional intermediate attenuation (18-25 HU) fluid is seen in the low pelvis and left upper quadrant, likely redistributed. Additional foci of anti dependent gas are noted along the anterior peritoneal surface. Some peritoneal thickening is noted in the right and left lower quadrants compatible with developing peritonitis. Musculoskeletal: No acute osseous abnormality or suspicious osseous lesion. IMPRESSION: 1. Perforated appendicitis with extensive  fluid and phlegmonous change centered upon the inflamed appendix in the right lower quadrant. No discrete or organized abscess has formed at this time. Evidence of developing peritonitis. 2. Reactive thickening of the cecal tip as well as the adjacent ileum which courses in close proximity. No resulting obstruction in the inflamed portions of the bowel. Electronically Signed   By: Kreg Shropshire M.D.   On: 07/24/2019 19:39   DG  Abd Portable 1 View  Result Date: 07/24/2019 CLINICAL DATA:  Abdomen pain EXAM: PORTABLE ABDOMEN - 1 VIEW COMPARISON:  CT abdomen pelvis March 29, 2017 FINDINGS: The bowel gas pattern is normal. No radio-opaque calculi or other significant radiographic abnormality are seen. IMPRESSION: No bowel obstruction. Electronically Signed   By: Abelardo Diesel M.D.   On: 07/24/2019 18:42    Assessment & Plan:   Problem List Items Addressed This Visit     ADD (attention deficit disorder) without hyperactivity    Adderall [place] mane  Potential benefits of a long term stimulants  use as well as potential risks  and complications were explained to the patient and were aknowledged.      Diarrhea    Not better  Will try empiric Entocort 9 mg q am  Potential benefits of a long term steroid  use as well as potential risks  and complications were explained to the patient and were aknowledged.        HYPERTENSION    On Azor      Relevant Orders   Urinalysis   PANIC DISORDER    Xanax prn  Potential benefits of a long term benzodiazepines  use as well as potential risks  and complications were explained to the patient and were aknowledged.      Well adult exam - Primary    We discussed age appropriate health related issues, including available/recomended screening tests and vaccinations. We discussed a need for adhering to healthy diet and exercise. Labs were ordered to be later reviewed . All questions were answered. Colon 09/2018         Meds ordered this encounter   Medications   Budesonide ER 9 MG TB24    Sig: Take 1 tablet by mouth in the morning.    Dispense:  30 tablet    Refill:  3      Follow-up: Return in about 3 months (around 08/26/2022) for a follow-up visit.  Walker Kehr, MD

## 2022-05-27 NOTE — Assessment & Plan Note (Signed)
Xanax prn  Potential benefits of a long term benzodiazepines  use as well as potential risks  and complications were explained to the patient and were aknowledged. 

## 2022-05-27 NOTE — Assessment & Plan Note (Signed)
On Azor  

## 2022-06-02 ENCOUNTER — Encounter: Payer: Self-pay | Admitting: Internal Medicine

## 2022-06-05 ENCOUNTER — Other Ambulatory Visit: Payer: Self-pay | Admitting: Internal Medicine

## 2022-06-05 DIAGNOSIS — N320 Bladder-neck obstruction: Secondary | ICD-10-CM

## 2022-06-07 ENCOUNTER — Encounter: Payer: Self-pay | Admitting: Internal Medicine

## 2022-06-08 NOTE — Assessment & Plan Note (Signed)
We discussed age appropriate health related issues, including available/recomended screening tests and vaccinations. We discussed a need for adhering to healthy diet and exercise. Labs were ordered to be later reviewed . All questions were answered. Colon 09/2018

## 2022-06-08 NOTE — Addendum Note (Signed)
Addended by: Tresa Garter on: 06/08/2022 12:04 AM   Modules accepted: Level of Service

## 2022-06-14 ENCOUNTER — Other Ambulatory Visit (HOSPITAL_COMMUNITY): Payer: Self-pay

## 2022-06-14 ENCOUNTER — Other Ambulatory Visit: Payer: Self-pay | Admitting: Internal Medicine

## 2022-06-14 MED ORDER — BALSALAZIDE DISODIUM 750 MG PO CAPS
2250.0000 mg | ORAL_CAPSULE | Freq: Three times a day (TID) | ORAL | 3 refills | Status: DC
  Filled 2022-06-14: qty 270, 30d supply, fill #0

## 2022-06-14 NOTE — Progress Notes (Signed)
Change to balsalazide due to insurance

## 2022-06-14 NOTE — Telephone Encounter (Signed)
Rec'd msg ins requires a trial of BALSALAZIDE...please change

## 2022-06-15 ENCOUNTER — Other Ambulatory Visit (HOSPITAL_COMMUNITY): Payer: Self-pay

## 2022-06-16 ENCOUNTER — Other Ambulatory Visit (HOSPITAL_COMMUNITY): Payer: Self-pay

## 2022-06-24 ENCOUNTER — Other Ambulatory Visit: Payer: Self-pay | Admitting: Internal Medicine

## 2022-06-25 ENCOUNTER — Other Ambulatory Visit (HOSPITAL_COMMUNITY): Payer: Self-pay

## 2022-07-02 ENCOUNTER — Other Ambulatory Visit (HOSPITAL_COMMUNITY): Payer: Self-pay

## 2022-07-02 ENCOUNTER — Other Ambulatory Visit: Payer: Self-pay | Admitting: Internal Medicine

## 2022-07-05 MED ORDER — HYDROCODONE-ACETAMINOPHEN 10-325 MG PO TABS
1.0000 | ORAL_TABLET | Freq: Four times a day (QID) | ORAL | 0 refills | Status: DC | PRN
  Filled 2022-07-05: qty 120, 30d supply, fill #0

## 2022-07-05 MED ORDER — AMPHETAMINE-DEXTROAMPHETAMINE 10 MG PO TABS
10.0000 mg | ORAL_TABLET | Freq: Two times a day (BID) | ORAL | 0 refills | Status: DC
  Filled 2022-07-05: qty 60, 30d supply, fill #0

## 2022-07-06 ENCOUNTER — Other Ambulatory Visit (HOSPITAL_COMMUNITY): Payer: Self-pay

## 2022-07-07 ENCOUNTER — Other Ambulatory Visit (HOSPITAL_COMMUNITY): Payer: Self-pay

## 2022-08-05 ENCOUNTER — Other Ambulatory Visit (HOSPITAL_COMMUNITY): Payer: Self-pay

## 2022-08-05 ENCOUNTER — Other Ambulatory Visit: Payer: Self-pay | Admitting: Internal Medicine

## 2022-08-05 MED ORDER — HYDROCODONE-ACETAMINOPHEN 10-325 MG PO TABS
1.0000 | ORAL_TABLET | Freq: Four times a day (QID) | ORAL | 0 refills | Status: DC | PRN
  Filled 2022-08-05: qty 120, 30d supply, fill #0

## 2022-08-05 MED ORDER — ALPRAZOLAM 2 MG PO TABS
1.0000 mg | ORAL_TABLET | Freq: Four times a day (QID) | ORAL | 1 refills | Status: DC | PRN
  Filled 2022-08-05 – 2022-08-18 (×2): qty 120, 60d supply, fill #0

## 2022-08-05 MED ORDER — AMPHETAMINE-DEXTROAMPHETAMINE 10 MG PO TABS
10.0000 mg | ORAL_TABLET | Freq: Two times a day (BID) | ORAL | 0 refills | Status: DC
  Filled 2022-08-05: qty 60, 30d supply, fill #0

## 2022-08-18 ENCOUNTER — Other Ambulatory Visit (HOSPITAL_COMMUNITY): Payer: Self-pay

## 2022-08-21 DIAGNOSIS — I959 Hypotension, unspecified: Secondary | ICD-10-CM | POA: Diagnosis not present

## 2022-08-21 DIAGNOSIS — R55 Syncope and collapse: Secondary | ICD-10-CM | POA: Diagnosis not present

## 2022-08-21 DIAGNOSIS — R569 Unspecified convulsions: Secondary | ICD-10-CM | POA: Diagnosis not present

## 2022-08-21 DIAGNOSIS — W19XXXA Unspecified fall, initial encounter: Secondary | ICD-10-CM | POA: Diagnosis not present

## 2022-08-21 DIAGNOSIS — K639 Disease of intestine, unspecified: Secondary | ICD-10-CM | POA: Diagnosis not present

## 2022-08-21 DIAGNOSIS — R4701 Aphasia: Secondary | ICD-10-CM | POA: Diagnosis not present

## 2022-08-21 DIAGNOSIS — R1084 Generalized abdominal pain: Secondary | ICD-10-CM | POA: Diagnosis not present

## 2022-08-23 DIAGNOSIS — G4089 Other seizures: Secondary | ICD-10-CM | POA: Diagnosis not present

## 2022-08-26 ENCOUNTER — Other Ambulatory Visit (HOSPITAL_COMMUNITY): Payer: Self-pay

## 2022-08-26 ENCOUNTER — Ambulatory Visit: Payer: 59 | Admitting: Internal Medicine

## 2022-08-26 ENCOUNTER — Encounter: Payer: Self-pay | Admitting: Internal Medicine

## 2022-08-26 VITALS — BP 118/70 | HR 65 | Temp 98.4°F | Ht 67.0 in | Wt 221.0 lb

## 2022-08-26 DIAGNOSIS — F445 Conversion disorder with seizures or convulsions: Secondary | ICD-10-CM | POA: Insufficient documentation

## 2022-08-26 DIAGNOSIS — B009 Herpesviral infection, unspecified: Secondary | ICD-10-CM | POA: Insufficient documentation

## 2022-08-26 DIAGNOSIS — R569 Unspecified convulsions: Secondary | ICD-10-CM

## 2022-08-26 DIAGNOSIS — G40909 Epilepsy, unspecified, not intractable, without status epilepticus: Secondary | ICD-10-CM | POA: Insufficient documentation

## 2022-08-26 DIAGNOSIS — T402X5A Adverse effect of other opioids, initial encounter: Secondary | ICD-10-CM | POA: Insufficient documentation

## 2022-08-26 DIAGNOSIS — N522 Drug-induced erectile dysfunction: Secondary | ICD-10-CM | POA: Insufficient documentation

## 2022-08-26 DIAGNOSIS — G894 Chronic pain syndrome: Secondary | ICD-10-CM | POA: Insufficient documentation

## 2022-08-26 MED ORDER — LEVETIRACETAM 500 MG PO TABS
500.0000 mg | ORAL_TABLET | Freq: Two times a day (BID) | ORAL | 3 refills | Status: DC
  Filled 2022-08-26: qty 60, 30d supply, fill #0

## 2022-08-26 MED ORDER — ALPRAZOLAM 2 MG PO TABS
1.0000 mg | ORAL_TABLET | Freq: Four times a day (QID) | ORAL | 1 refills | Status: DC | PRN
  Filled 2022-08-26: qty 34, 17d supply, fill #0
  Filled 2022-08-26 – 2022-09-02 (×2): qty 120, 60d supply, fill #0
  Filled 2022-11-03: qty 120, 60d supply, fill #1

## 2022-08-26 NOTE — Progress Notes (Signed)
Subjective:  Patient ID: Mark Mcgee, male    DOB: 1966-09-12  Age: 56 y.o. MRN: [id number]  CC: Follow-up (Er follow up as well had multiple seizures )   HPI Mark Mcgee presents for a seizure on 08/21/22.  Dog was put down on 08/21/22. [name] did not get Alprazolam filled - Rx did not go through. Neurology ref was made in [place] Dad passed away in 2022/07/20.    Per ER note:   'Pt arrives from home via [facility] w/ cc of seizure activity. Pt reports he was getting out of his car after a very stressful trip (had to euthanize a beloved pet) when "it hit me." and he "went down". Pt's friend withessed pt with full body convulsions and called 911; paramedics arrived to find patient on ground, awake, but w/ expressive aphasia. Pt was incontinent of urine. Upon arrival pt is awake and anxious, oriented x 4, describes ongoing brain fog and difficulty getting words out. MAE, face symmetrical all VS WNL. Pt c/o LLQ abdominal pain. Electronically signed by Luevenia Maxin, RN at 08/21/2022 1:31 PM EST  Back to top of ED Notes Antoine Primas, MD - 08/21/2022 1:07 PM EST Formatting of this note is different from the original.  [facility] Emergency Department Provider Note  MRN: [id number]  ARAM DOMZALSKI is a 56 y.o. year-old male Arrival date & time: 08/21/22  No past medical history on file. Past Surgical History: Procedure Laterality Date  ANKLE SURGERY Left  MANDIBLE SURGERY  TUMOR REMOVAL from neck and chest  Chief Complaint: Chief Complaint Patient presents with  Seizures  History and Review of Systems Mark Mcgee is a 56 y.o. year-old male with a medical history remote leukemia who presents to the emergency department by EMS due to concern for seizure. EMS reported sugar was well when they arrived. He was mildly confused and was having hard time finding words. No intervention prior to arrival.  Patient report he was pulling into his car  garage with his friend at which point his leg was slamming brake. He reports he could not let go of the brake. He then tried to get out of the car and felt lightheaded and dizzy. He slowly put himself to the ground without a fall or hitting his head. He does not remember what happened but felt like people around him were talking. His friend informed him he was having generalized shaking however does not remember how long it lasted. Patient did also have urinary incontinence during this episode. Patient reports no history of seizures. He reports he has been very stressed as he had to put down his dog. He denies using any substance. Denies tobacco or cocaine use. Denies vomiting. He does have chronic abdominal pain and a history of appendectomy. He does get intermittent diarrhea and reports a headache with blurry vision that has been on intermittent. He currently denies any chest pain or shortness of breath. Denies any urinary symptoms. Otherwise no other complaints.  Per patient friend, he reports he did have an episode of generalized shaking while he got out of his car that lasted about a minute. Patient then was confused and laughing inappropriately. Friend reports they just put down his dog and initially he thought he was stressed and nervous. However he did have another episode while they went to the house was generalized shaking and foaming at the mouth. He was concern for seizure that called EMS.  Review  of Systems A pertinent ROS was obtained and was negative except as noted in the HPI and MDM.  Physical Exam Physical Exam  General: Awake, alert and oriented. No acute distress. Well developed, hydrated and nourished. Appears stated age. Skin: Skin in warm, dry and intact without rashes or lesions. HEENT: No buccal laceration. Normocephalic and atraumatic Sclera is non-icteric. EOM are intact, PERRLA..The neck is supple without stiffness. Cardiac: Heart rate and rhythm are normal.Pulses palpable BL  in UE and LE. Respiratory: Chest wall is non-tender. No signs of respiratory distress. Lung sounds are clear in all lobes bilaterally without rales, ronchi, or wheezes. Abdominal: Diffuse left lower abdominal tenderness without guarding or rebound. Extremities: Upper and lower extremities are atraumatic in appearance without tenderness or deformity. Full range of motion is noted to all joints. Muscle strength is 5/5 bilaterally. . Neurological: The patient is awake, alert and oriented to person, place, and time with normal speech. Motor function is normal with muscle strength 5/5 bilaterally to upper and lower extremities.. Cerebellar function is intact. Memory is normal and thought process is intact. Sensation to the upper and lower extremities is normal bilaterally. Grip strength is normal bilaterally. Dorsi/plantar flexion is normal bilaterally. GU: Urinary incontinence, pants are soaked in urine.   Lab and/or imaging result summary: See MDM/Re-exams  Medical Decision Making  Patient presents as above. Vital signs are stable. He is not hypoxic or tachycardic at this time. His POC glucose of 141. Patient symptoms concerning for acute seizure of unknown cause at this time. I did consider secondary etiologies of epileptic seizures to include drug / toxin etiologies (ETOH, stimulants, medication side effects), metabolic disturbances (glucose, Na), acute CNS infections (meningitis, encephalitis, abscess), ICH / tumor / CVA. Presentation not consistent with impact seizure related to head trauma. Patient with no signs of trauma from the seizure. Labs and imaging are ordered based on the above differentials. Patient CBC with leukocytosis of 15. Hemoglobin adequate are stable. CMP with a bicarb of 20 and creatinine of 1.33. I did give him a bolus of LR for hydration. Furthermore I did give him Reglan and Tylenol for headache. No significant anion gap. Magnesium is reassuring. Patient low bicarb is most likely  in the setting of diarrhea. His POC glucose is reassuring at 141. Screening troponin is negative. His EKG showed normal sinus rhythm without ischemic changes. No abnormal arrhythmias. Intervals are appropriate. CT head without acute finding. No obvious intracranial etiology. CT chest abdomen pelvis showed inflammatory changes with possible proctocolitis. At this time patient does report a history of diarrhea that has been persistent and has been followed by his PCP. Patient UDS is positive for opiates and THC. He did inform me he has been using CBD and does take hydrocodone for pain that is chronic. Patient continued to remain stable without any further seizure-like activity. His neuroexam is reassuring. No focal deficit. I have referred him to neurology and GI. Prior to discharge, I did inform him not to operate any heavy machinery or drive. I did communicate this with his friend as well. I informed him not to drive until his follow-up with his neurology and his primary care provider. Patient showed understanding. Strict return precaution has been discussed. All question has been answered.  Note is dictated utilizing voice recognition software. Unfortunately this leads to occasional typographical errors. I apologize in advance if the situation occurs. If questions occur, please do not hesitate to reach out to me directly.  Patient's presentation is most consistent with  acute presentation with potential threat to life or bodily function. .  Further history obtained from: Friend as stated in MDM/HPI.  Procedures Procedures  ED MEDS: Medications acetaminophen (TYLENOL) tablet 1,000 mg (1,000 mg Oral Given 08/21/22 1343) iohexoL (OMNIPAQUE 350) 350 mg iodine/mL injection 100 mL (100 mLs Intravenous Given 08/21/22 1443) lactated ringers bolus (1,000 mLs Intravenous New Bag 08/21/22 1737) metoclopramide (REGLAN) injection 10 mg (10 mg Intravenous Given 08/21/22 1737)  Final Clinical Impressions(s) 1.  Seizure [facility])  ED Disposition ED Disposition ED Disposition Discharge Condition Stable Comment Luther Redo discharge to home/self care.      Labs Reviewed COMPREHENSIVE METABOLIC PANEL - Abnormal; Notable for the following components: Result Value CO2 20 (*) Creatinine 1.33 (*) All other components within normal limits URINE DRUG SCREEN COMPREHENSIVE - Abnormal; Notable for the following components: Opiates Positive (*) THC Positive (*) All other components within normal limits Narrative: The screening procedure aims to detect the presence of drugs and it is regarded as presumptive (qualitative analysis). Positive screening detection requires confirmation using more sensitive and specific quantitative methods, such as high performance liquid chromatography and mass spectrometry. POCT GLUCOSE - Abnormal; Notable for the following components: GLUCOSE, POC, NOVA 141 (*) All other components within normal limits CBC WITH AUTO DIFFERENTIAL PANEL - Abnormal; Notable for the following components: WBC 15.2 (*) MCV 78.3 (*) MCH 25.3 (*) MCHC 32.4 (*) Neutrophil Absolute 13.9 (*) Lymphocyte Absolute 0.5 (*) All other components within normal limits MAGNESIUM - Normal TROPONIN I - Normal CBC AND DIFFERENTIAL Narrative: The following orders were created for panel order CBC and Differential. Procedure Abnormality Status --------- ----------- ------ CBC and Differential[[id number]] Abnormal Final result  Please view results for these tests on the individual orders.  CT ABDOMEN PELVIS W CONTRAST (ROUTINE) Final Result  1. Apparent circumferential wall thickening of the distal sigmoid colon and rectum is nonspecific and may relate to degree of luminal underdistention. However, a nonspecific infectious/inflammatory proctocolitis could have a similar radiographic appearance and is not excluded in the appropriate clinical context. 2. Mild circumferential rectal wall thickening of  the distal thoracic esophagus, which could reflect gastroesophageal reflux and/or esophagitis. 3. Hepatic steatosis. 4. Ancillary findings as above.   CT Head Wo Contrast Final Result   1. No specific evidence of acute intracranial abnormality. MRI could provide a more sensitive evaluation for structural seizure foci, if clinically indicated. 2. 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 Follow-up CT could confirm stability, if there is clinical concern.   Electronically signed by: Antoine Primas, MD 08/21/2022 1:07 PM  Electronically signed by: Antoine Primas, MD 08/21/22 1807 "  Outpatient Medications Prior to Visit  Medication Sig Dispense Refill   acetaminophen (TYLENOL) 325 MG tablet Take 325-650 mg by mouth every 6 (six) hours as needed for mild pain or headache.      acyclovir (ZOVIRAX) 400 MG tablet Take 1 tablet (400 mg total) by mouth 4 (four) times daily as needed (as directed for outbreaks). 60 tablet 3   albuterol (VENTOLIN HFA) 108 (90 Base) MCG/ACT inhaler Inhale 2 puffs into the lungs every 4 (four) hours as needed for wheezing or shortness of breath. 18 g 5   amLODipine-olmesartan (AZOR) 10-40 MG tablet TAKE 1 TABLET BY MOUTH ONCE DAILY 90 tablet 3   b complex vitamins tablet Take 1 tablet by mouth daily.     balsalazide (COLAZAL)  750 MG capsule Take 3 capsules (2,250 mg total) by mouth 3 (three) times daily. (Patient not taking: Reported on 09/21/2022) 270 capsule 3   Cholecalciferol (VITAMIN D3) 50 MCG (2000 UT) TABS Take 2,000 Units by mouth daily with breakfast.     diphenhydrAMINE (BENADRYL) 25 mg capsule Take 25 mg by mouth every 6 (six) hours as needed (for seasonal allergies).     diphenoxylate-atropine (LOMOTIL) 2.5-0.025 MG tablet TAKE 1 OR 2 TABLETS BY MOUTH 4 TIMES DAILY AS NEEDED FOR DIARRHEA OR  LOOSE STOOLS MAX OF 8 TABLETS PER DAY (Patient taking differently: Take 1-2 tablets by mouth 4 (four) times daily as needed for diarrhea or loose stools (MAX OF 8 TABLETS/24 HOURS).) 60 tablet 3   Flaxseed, Linseed, (FLAX SEEDS) POWD 2 (two) times daily as needed (as directed when mixing into health shakes).     Fluticasone-Umeclidin-Vilant (TRELEGY ELLIPTA) 100-62.5-25 MCG/ACT AEPB Inhale 1 puff into the lungs daily. 60 each 11   glycopyrrolate (ROBINUL) 2 MG tablet Take 1 tablet (2 mg total) by mouth 2 (two) times daily. (Patient taking differently: Take 2 mg by mouth 2 (two) times daily as needed (spasms).) 180 tablet 0   ipratropium (ATROVENT) 0.03 % nasal spray Place 2 sprays into both nostrils 2 (two) times daily. (Patient taking differently: Place 2 sprays into both nostrils 2 (two) times daily as needed (for seasonal allergies).) 30 mL 5   loperamide (IMODIUM) 2 MG capsule Take 2 mg by mouth as needed for diarrhea or loose stools.     Multiple Vitamin (MULTIVITAMIN) capsule Take 1 capsule by mouth daily.      zinc gluconate 50 MG tablet Take 100 mg by mouth daily.     alprazolam (XANAX) 2 MG tablet Take 1/2 tablet by mouth 4 times daily as needed for sleep or anxiety. 120 tablet 1   amphetamine-dextroamphetamine (ADDERALL) 10 MG tablet Take 1 tablet (10 mg total) by mouth 2 (two) times daily with breakfast and lunch 60 tablet 0   Budesonide ER 9 MG TB24 Take 1 tablet by mouth in the morning. 30 tablet 3   HYDROcodone-acetaminophen (NORCO) 10-325 MG tablet Take 1 tablet by mouth every 6 (six) hours as needed for pain 120 tablet 0   tadalafil (CIALIS) 5 MG tablet Take 1 tablet (5 mg total) by mouth daily. 90 tablet 3   triamcinolone ointment (KENALOG) 0.1 % Apply 1 application topically 2 (two) times daily. (Patient taking differently: Apply 1 application  topically 2 (two) times daily as needed (for rashes).) 80 g 2   testosterone cypionate (DEPOTESTOSTERONE CYPIONATE) 200 MG/ML injection INJECT  1 ML INTO THE MUSCLE EVERY 14 DAYS 10 mL 5   No facility-administered medications prior to visit.    ROS: Review of Systems  Constitutional:  Positive for fatigue. Negative for appetite change and unexpected weight change.  HENT:  Negative for congestion, nosebleeds, sneezing, sore throat and trouble swallowing.   Eyes:  Negative for itching and visual disturbance.  Respiratory:  Positive for cough.   Cardiovascular:  Negative for chest pain, palpitations and leg swelling.  Gastrointestinal:  Negative for abdominal distention, blood in stool, diarrhea and nausea.  Genitourinary:  Positive for frequency and urgency. Negative for genital sores and hematuria.  Musculoskeletal:  Positive for arthralgias, back pain and myalgias. Negative for gait problem, joint swelling and neck pain.  Skin:  Negative for rash.  Neurological:  Positive for seizures, weakness and headaches. Negative for dizziness, tremors and speech difficulty.  Hematological:  Does  not bruise/bleed easily.  Psychiatric/Behavioral:  Positive for decreased concentration and dysphoric mood. Negative for agitation, sleep disturbance and suicidal ideas. The patient is nervous/anxious.   Follow send no acute distress.  He is anxious and worried about the past seizure event  Objective:  BP 118/70 (BP Location: Left Arm, Patient Position: Sitting, Cuff Size: Normal)   Pulse 65   Temp 98.4 F (36.9 C) (Oral)   Ht  (1.702 m)   Wt 221 lb (100.2 kg)   SpO2 99%   BMI 34.61 kg/m   BP Readings from Last 3 Encounters:  09/21/22 107/74  08/26/22 118/70  05/27/22 132/84    Wt Readings from Last 3 Encounters:  09/21/22 225 lb 3.2 oz (102.2 kg)  08/26/22 221 lb (100.2 kg)  05/27/22 238 lb 9.6 oz (108.2 kg)    Physical Exam Constitutional:      General: He is not in acute distress.    Appearance: He is well-developed.     Comments: NAD  Eyes:     Conjunctiva/sclera: Conjunctivae normal.     Pupils: Pupils are equal,  round, and reactive to light.  Neck:     Thyroid: No thyromegaly.     Vascular: No JVD.  Cardiovascular:     Rate and Rhythm: Normal rate and regular rhythm.     Heart sounds: Normal heart sounds. No murmur heard.    No friction rub. No gallop.  Pulmonary:     Effort: Pulmonary effort is normal. No respiratory distress.     Breath sounds: Normal breath sounds. No wheezing or rales.  Chest:     Chest wall: No tenderness.  Abdominal:     General: Bowel sounds are normal. There is no distension.     Palpations: Abdomen is soft. There is no mass.     Tenderness: There is no abdominal tenderness. There is no guarding or rebound.  Musculoskeletal:        General: No tenderness. Normal range of motion.     Cervical back: Normal range of motion.  Lymphadenopathy:     Cervical: No cervical adenopathy.  Skin:    General: Skin is warm and dry.     Findings: No rash.  Neurological:     Mental Status: He is alert and oriented to person, place, and time.     Cranial Nerves: No cranial nerve deficit.     Motor: No abnormal muscle tone.     Coordination: Coordination normal.     Gait: Gait normal.     Deep Tendon Reflexes: Reflexes are normal and symmetric.  Psychiatric:        Behavior: Behavior normal.        Thought Content: Thought content normal.        Judgment: Judgment normal.     A total time of 45 minutes was spent preparing to see the patient, reviewing tests, x-rays, operative reports and other medical records.  Also, obtaining history and performing comprehensive physical exam.  Additionally, counseling the patient regarding the above listed issues.   Finally, documenting clinical information in the health records, coordination of care, educating the patient regarding a possible benzodiazepine withdrawal seizure. It is a complex case.  Lab Results  Component Value Date   WBC 6.4 03/09/2021   HGB 14.3 03/09/2021   HCT 44.5 03/09/2021   PLT 240.0 03/09/2021   GLUCOSE 95  02/24/2022   CHOL 183 03/09/2021   TRIG 112.0 03/09/2021   HDL 52.20 03/09/2021   LDLDIRECT 91.0  12/04/2019   LDLCALC 108 (H) 03/09/2021   ALT 27 02/24/2022   AST 17 02/24/2022   NA 141 02/24/2022   K 4.3 02/24/2022   CL 103 02/24/2022   CREATININE 1.26 02/24/2022   BUN 17 02/24/2022   CO2 28 02/24/2022   TSH 4.92 02/24/2022   PSA 0.43 02/24/2022    CT ABDOMEN PELVIS W CONTRAST  Result Date: 07/24/2019 CLINICAL DATA:  Right lower quadrant pain for 3 days, constipation EXAM: CT ABDOMEN AND PELVIS WITH CONTRAST TECHNIQUE: Multidetector CT imaging of the abdomen and pelvis was performed using the standard protocol following bolus administration of intravenous contrast. CONTRAST:  OMNIPAQUE IOHEXOL 300 MG/ML  SOLN COMPARISON:  CT abdomen pelvis 03/29/2017 FINDINGS: Lower chest: Basilar atelectatic changes. Lung bases otherwise clear. Normal heart size. No pericardial effusion. Hepatobiliary: No focal liver abnormality is seen. Prominent fold at the neck of the gallbladder. No calcified gallstones, pericholecystic inflammation or biliary ductal dilatation. Pancreas: Partial fatty replacement of the pancreas. No peripancreatic inflammation or ductal dilatation. Spleen: Normal in size without focal abnormality. Adrenals/Urinary Tract: Normal adrenal glands. 11 mm hypoattenuating simple appearing fluid attenuation cyst in the interpolar right kidney. Kidneys are otherwise unremarkable, without renal calculi, suspicious lesion, or hydronephrosis. Bladder is unremarkable. No extravasation of contrast is seen on excretory phase delayed imaging. Stomach/Bowel: Dilated air and fluid-filled appendix with peripheral hyperemia measuring up to 2.3 cm in diameter. Small amount extraluminal gas extending from the base of the appendix (3/70) as well as surrounding fluid and phlegmonous change. No discrete or organized abscess has formed at this time. The air is reactive thickening of the cecal tip as well as  the adjacent ileum which courses in close proximity. No resulting obstruction in the inflamed portions of the bowel. Distal esophagus, stomach and more proximal small bowel are unremarkable. More distal colonic segments are free of acute abnormality. Vascular/Lymphatic: The aorta is normal caliber. Reactive adenopathy in the right lower quadrant. No suspicious or enlarged lymph nodes in the included lymphatic chains. Reproductive: The prostate and seminal vesicles are unremarkable. Other: There is extensive fluid and phlegmonous change centered upon the inflamed appendix in the right lower quadrant. Additional intermediate attenuation (18-25 HU) fluid is seen in the low pelvis and left upper quadrant, likely redistributed. Additional foci of anti dependent gas are noted along the anterior peritoneal surface. Some peritoneal thickening is noted in the right and left lower quadrants compatible with developing peritonitis. Musculoskeletal: No acute osseous abnormality or suspicious osseous lesion. IMPRESSION: 1. Perforated appendicitis with extensive fluid and phlegmonous change centered upon the inflamed appendix in the right lower quadrant. No discrete or organized abscess has formed at this time. Evidence of developing peritonitis. 2. Reactive thickening of the cecal tip as well as the adjacent ileum which courses in close proximity. No resulting obstruction in the inflamed portions of the bowel. Electronically Signed   By: Kreg Shropshire M.D.   On: 07/24/2019 19:39   DG Abd Portable 1 View  Result Date: 07/24/2019 CLINICAL DATA:  Abdomen pain EXAM: PORTABLE ABDOMEN - 1 VIEW COMPARISON:  CT abdomen pelvis March 29, 2017 FINDINGS: The bowel gas pattern is normal. No radio-opaque calculi or other significant radiographic abnormality are seen. IMPRESSION: No bowel obstruction. Electronically Signed   By: Sherian Rein M.D.   On: 07/24/2019 18:42    Assessment & Plan:   Problem List Items Addressed This Visit      Seizure [facility]) - Primary    New seizure x4 on 08/21/22.  Dog was put down on 08/21/22. [place] did not get Alprazolam filled - Rx did not go through. Neurology ref was made in [place] Poss benzodiazepine withdrawal. Keppra  No driving      Relevant Medications   levETIRAcetam (KEPPRA) 500 MG tablet      Meds ordered this encounter  Medications   alprazolam (XANAX) 2 MG tablet    Sig: Take 1/2 tablet by mouth 4 times daily as needed for sleep or anxiety.    Dispense:  120 tablet    Refill:  1   levETIRAcetam (KEPPRA) 500 MG tablet    Sig: Take 1 tablet (500 mg total) by mouth 2 (two) times daily.    Dispense:  60 tablet    Refill:  3      Follow-up: Return in about 6 weeks (around 10/07/2022) for a follow-up visit.  Sonda Primes, MD

## 2022-08-26 NOTE — Patient Instructions (Signed)
Do not drive 

## 2022-08-26 NOTE — Assessment & Plan Note (Addendum)
New seizure x4 on 08/21/22.  Dog was put down on 08/21/22. [place] did not get Alprazolam filled - Rx did not go through. Neurology ref was made in [place] Poss benzodiazepine withdrawal. Keppra  No driving

## 2022-09-02 ENCOUNTER — Other Ambulatory Visit (HOSPITAL_COMMUNITY): Payer: Self-pay

## 2022-09-03 ENCOUNTER — Other Ambulatory Visit (HOSPITAL_COMMUNITY): Payer: Self-pay

## 2022-09-03 ENCOUNTER — Encounter: Payer: Self-pay | Admitting: Internal Medicine

## 2022-09-04 ENCOUNTER — Other Ambulatory Visit: Payer: Self-pay | Admitting: Internal Medicine

## 2022-09-06 ENCOUNTER — Other Ambulatory Visit (HOSPITAL_COMMUNITY): Payer: Self-pay

## 2022-09-07 ENCOUNTER — Other Ambulatory Visit: Payer: Self-pay | Admitting: Internal Medicine

## 2022-09-07 ENCOUNTER — Other Ambulatory Visit (HOSPITAL_COMMUNITY): Payer: Self-pay

## 2022-09-07 DIAGNOSIS — R569 Unspecified convulsions: Secondary | ICD-10-CM

## 2022-09-08 ENCOUNTER — Other Ambulatory Visit (HOSPITAL_COMMUNITY): Payer: Self-pay

## 2022-09-08 MED ORDER — TADALAFIL 5 MG PO TABS
5.0000 mg | ORAL_TABLET | Freq: Every day | ORAL | 3 refills | Status: DC
  Filled 2022-09-08: qty 90, 90d supply, fill #0
  Filled 2023-01-03: qty 90, 90d supply, fill #1

## 2022-09-08 MED ORDER — HYDROCODONE-ACETAMINOPHEN 10-325 MG PO TABS
1.0000 | ORAL_TABLET | Freq: Four times a day (QID) | ORAL | 0 refills | Status: DC | PRN
  Filled 2022-09-08: qty 120, 30d supply, fill #0

## 2022-09-09 ENCOUNTER — Encounter: Payer: Self-pay | Admitting: Neurology

## 2022-09-21 ENCOUNTER — Other Ambulatory Visit: Payer: Self-pay

## 2022-09-21 ENCOUNTER — Ambulatory Visit: Payer: 59 | Admitting: Neurology

## 2022-09-21 ENCOUNTER — Encounter: Payer: Self-pay | Admitting: Neurology

## 2022-09-21 ENCOUNTER — Other Ambulatory Visit: Payer: 59

## 2022-09-21 ENCOUNTER — Other Ambulatory Visit (HOSPITAL_COMMUNITY): Payer: Self-pay

## 2022-09-21 VITALS — BP 107/74 | HR 72 | Ht 67.0 in | Wt 225.2 lb

## 2022-09-21 DIAGNOSIS — R251 Tremor, unspecified: Secondary | ICD-10-CM

## 2022-09-21 DIAGNOSIS — R569 Unspecified convulsions: Secondary | ICD-10-CM

## 2022-09-21 MED ORDER — ZONISAMIDE 100 MG PO CAPS
ORAL_CAPSULE | ORAL | 6 refills | Status: DC
  Filled 2022-09-21 (×2): qty 90, 30d supply, fill #0
  Filled 2022-10-20: qty 90, 30d supply, fill #1
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
## 2022-09-22 ENCOUNTER — Other Ambulatory Visit: Payer: 59

## 2022-09-22 ENCOUNTER — Ambulatory Visit: Payer: 59 | Admitting: Neurology

## 2022-09-22 DIAGNOSIS — R569 Unspecified convulsions: Secondary | ICD-10-CM

## 2022-09-22 DIAGNOSIS — R251 Tremor, unspecified: Secondary | ICD-10-CM

## 2022-09-22 LAB — TSH: TSH: 2.1 u[IU]/mL (ref 0.35–5.50)

## 2022-09-22 NOTE — Progress Notes (Signed)
EEG complete - results pending 

## 2022-09-28 ENCOUNTER — Telehealth: Payer: Self-pay | Admitting: Neurology

## 2022-09-28 NOTE — Telephone Encounter (Signed)
Pt called in to let Dr. Delice Lesch know he had a seizure last night at 11:20 PM and another one on Saturday at 9:05 PM. Both were witnessed.

## 2022-09-28 NOTE — Telephone Encounter (Signed)
Spoke to patient. EEG is normal. He is just feeling tired right now. On Sat, sitting on couch, then next thing he knew he was on the floor, fallen on coffee table. Partner found him on the floor. Awake but not aware. He had a sz at 11:20pm 09/27/21, started feeling tightness around neck, left side, like shirt was too tight; started having the flashes in his eyes/bright spots, feeling uncomfortable; alerted partner that he was not feeling right, then started getting rigid again. He knew what was happening, could hear what he was saying, knew what was going on, but could not stop it. Legs twisting, chest is so painful today from the muscle contractions. It lasted 59mins, took awhile to speak normal. Per partner, just looking at him but not answering/responding. Did not fall.Today had problem with keeping words together. Did not sleep last night at all.   Instructed to increase Keppra to 500mg : 1.5 tabs BID as he uptitrates the Zonisamide. He just increased Zonisamide to 200mg . Once he is on the 300mg  daily dose, he will slowly wean down Keppra.  Scheduled for MRI 10/12/22. We discussed that if seizures continue after medication adjustments, would do a 72-hour EEG for seizure classification. He expressed understanding.

## 2022-09-28 NOTE — Procedures (Signed)
ELECTROENCEPHALOGRAM REPORT  Date of Study: 09/22/2022  Patient's Name: Mark Mcgee MRN: [id number] Date of Birth: [birth year]  Referring Provider: Dr. Ellouise Newer  Clinical History: This is a 57 year old man with new onset seizures with witnessed convulsions, staring episodes and loss of consciousness. EEG for classification.  Medications: Keppra, Zonisamide, Xanax, Norco  Technical Summary: A multichannel digital EEG recording measured by the international 10-20 system with electrodes applied with paste and impedances below 5000 ohms performed in our laboratory with EKG monitoring in an awake and asleep patient.  Hyperventilation and photic stimulation were performed.  The digital EEG was referentially recorded, reformatted, and digitally filtered in a variety of bipolar and referential montages for optimal display.    Description: The patient is awake and asleep during the recording.  During maximal wakefulness, there is a symmetric, medium voltage 10 Hz posterior dominant rhythm that attenuates with eye opening.  The record is symmetric.  There is an excess amount of diffuse low voltage beta activity seen throughout the recording. During drowsiness and sleep, there is an increase in theta slowing of the background. Vertex waves and symmetric sleep spindles were seen.  Hyperventilation and photic stimulation did not elicit any abnormalities.  There were no epileptiform discharges or electrographic seizures seen.    EKG lead was unremarkable.  Impression: This awake and asleep EEG is normal except for excess amount of diffuse low voltage beta activity.  Clinical Correlation: Diffuse low voltage beta activity is commonly seen with sedating medications such as benzodiazepines.  In the absence of sedating medications, anxiety and hyperthyroidism may produce generalized beta activity.  The absence of epileptiform discharges does not exclude a clinical diagnosis of epilepsy.  If further  clinical questions remain, prolonged EEG may be helpful.  Clinical correlation is advised.   Ellouise Newer, M.D.

## 2022-09-28 NOTE — Telephone Encounter (Signed)
Pt c/o: seizure Missed medications?  No. Sleep deprived?  Yes.   Has ringing in ears gets 4 to 5 hr a night  Alcohol intake?  No. Increased stress? No. Any triggers?  Yes left side felt like he was bing chocked and flashes in his eyes like someone took his picture. Wasn't as bad as his 1st one he was able to talk,  Any change in medication color or shape? No. Back to their usual baseline self?  Yes.  . If no, advise go to ER Current medications prescribed by Dr. Delice Lesch: Keppra  500mg   2 tablet BID  Pt is up to  the Zonisamide 2 capsules every night for 2 weeks

## 2022-10-07 ENCOUNTER — Ambulatory Visit: Payer: 59 | Admitting: Internal Medicine

## 2022-10-07 ENCOUNTER — Encounter: Payer: Self-pay | Admitting: Internal Medicine

## 2022-10-07 VITALS — BP 124/82 | HR 75 | Temp 98.2°F | Ht 67.0 in | Wt 229.0 lb

## 2022-10-07 DIAGNOSIS — F418 Other specified anxiety disorders: Secondary | ICD-10-CM

## 2022-10-07 DIAGNOSIS — E785 Hyperlipidemia, unspecified: Secondary | ICD-10-CM | POA: Diagnosis not present

## 2022-10-07 DIAGNOSIS — G894 Chronic pain syndrome: Secondary | ICD-10-CM | POA: Diagnosis not present

## 2022-10-07 DIAGNOSIS — F4329 Adjustment disorder with other symptoms: Secondary | ICD-10-CM | POA: Diagnosis not present

## 2022-10-07 DIAGNOSIS — M5440 Lumbago with sciatica, unspecified side: Secondary | ICD-10-CM | POA: Diagnosis not present

## 2022-10-07 DIAGNOSIS — R0789 Other chest pain: Secondary | ICD-10-CM | POA: Diagnosis not present

## 2022-10-07 DIAGNOSIS — G40909 Epilepsy, unspecified, not intractable, without status epilepticus: Secondary | ICD-10-CM

## 2022-10-07 DIAGNOSIS — G8929 Other chronic pain: Secondary | ICD-10-CM

## 2022-10-07 DIAGNOSIS — R569 Unspecified convulsions: Secondary | ICD-10-CM

## 2022-10-07 DIAGNOSIS — K580 Irritable bowel syndrome with diarrhea: Secondary | ICD-10-CM

## 2022-10-07 NOTE — Assessment & Plan Note (Addendum)
IBS-d No change in symptoms On Colazal prn -it seems to help

## 2022-10-07 NOTE — Assessment & Plan Note (Addendum)
Recurrent x years - ?MSK CT calcium score test

## 2022-10-07 NOTE — Assessment & Plan Note (Signed)
Ongoing problems plus new epilepsy

## 2022-10-07 NOTE — Assessment & Plan Note (Addendum)
Not much better.  EEG - done. Brain MRI pending F/u w/Dr Delice Lesch -move up the appointment On Zonisamide (weaning off Keppra)

## 2022-10-07 NOTE — Patient Instructions (Signed)

## 2022-10-07 NOTE — Assessment & Plan Note (Signed)
Jaw and CP after seizures Norco prn  Potential benefits of a long term opioids use as well as potential risks (i.e. addiction risk, apnea etc) and complications (i.e. Somnolence, constipation and others) were explained to the patient and were aknowledged.

## 2022-10-07 NOTE — Progress Notes (Signed)
Subjective:  Patient ID: Mark Mcgee, male    DOB: Dec 03, 1965  Age: 57 y.o. MRN: [id number]  CC: Follow-up (Still having issues with seizures and has had some recent seizures , having some discomfort on left side of chest and when laying down and sneezes he feels a sharp pain and feels tingling in left arm ,12/24 last 23mins , 1/04 lasted 57mins 1/08 lasted 93mins 1/08 lasted 79mins 1/09 lasted 71mins )   HPI Mark Mcgee presents for epilepsy -she is not doing well.  He has been having repeat seizures. He continues to have chronic pain, tremors, anxiety, diarrhea.  He has been working from home.  He is having unusual chest pains, nonexertional.  F/u w/Dr Delice Lesch is in Feb 2024 He is currently on zonisamide (weaning off Keppra)  F/u IBS, anxiety, feeling in a "daze" all the time  Outpatient Medications Prior to Visit  Medication Sig Dispense Refill   acetaminophen (TYLENOL) 325 MG tablet Take 325-650 mg by mouth every 6 (six) hours as needed for mild pain or headache.      acyclovir (ZOVIRAX) 400 MG tablet Take 1 tablet (400 mg total) by mouth 4 (four) times daily as needed (as directed for outbreaks). 60 tablet 3   albuterol (VENTOLIN HFA) 108 (90 Base) MCG/ACT inhaler Inhale 2 puffs into the lungs every 4 (four) hours as needed for wheezing or shortness of breath. 18 g 5   alprazolam (XANAX) 2 MG tablet Take 1/2 tablet by mouth 4 times daily as needed for sleep or anxiety. 120 tablet 1   amLODipine-olmesartan (AZOR) 10-40 MG tablet TAKE 1 TABLET BY MOUTH ONCE DAILY 90 tablet 3   b complex vitamins tablet Take 1 tablet by mouth daily.     balsalazide (COLAZAL) 750 MG capsule Take 3 capsules (2,250 mg total) by mouth 3 (three) times daily. 270 capsule 3   Cholecalciferol (VITAMIN D3) 50 MCG (2000 UT) TABS Take 2,000 Units by mouth daily with breakfast.     diphenhydrAMINE (BENADRYL) 25 mg capsule Take 25 mg by mouth every 6 (six) hours as needed (for seasonal allergies).      diphenoxylate-atropine (LOMOTIL) 2.5-0.025 MG tablet TAKE 1 OR 2 TABLETS BY MOUTH 4 TIMES DAILY AS NEEDED FOR DIARRHEA OR LOOSE STOOLS MAX OF 8 TABLETS PER DAY (Patient taking differently: Take 1-2 tablets by mouth 4 (four) times daily as needed for diarrhea or loose stools (MAX OF 8 TABLETS/24 HOURS).) 60 tablet 3   Flaxseed, Linseed, (FLAX SEEDS) POWD 2 (two) times daily as needed (as directed when mixing into health shakes).     Fluticasone-Umeclidin-Vilant (TRELEGY ELLIPTA) 100-62.5-25 MCG/ACT AEPB Inhale 1 puff into the lungs daily. 60 each 11   glycopyrrolate (ROBINUL) 2 MG tablet Take 1 tablet (2 mg total) by mouth 2 (two) times daily. (Patient taking differently: Take 2 mg by mouth 2 (two) times daily as needed (spasms).) 180 tablet 0   HYDROcodone-acetaminophen (NORCO) 10-325 MG tablet Take 1 tablet by mouth every 6 (six) hours as needed for pain 120 tablet 0   ipratropium (ATROVENT) 0.03 % nasal spray Place 2 sprays into both nostrils 2 (two) times daily. (Patient taking differently: Place 2 sprays into both nostrils 2 (two) times daily as needed (for seasonal allergies).) 30 mL 5   levETIRAcetam (KEPPRA) 500 MG tablet Take 1 tablet (500 mg total) by mouth 2 (two) times daily. 60 tablet 3   loperamide (IMODIUM) 2 MG capsule Take 2 mg by mouth as needed for diarrhea  or loose stools.     Multiple Vitamin (MULTIVITAMIN) capsule Take 1 capsule by mouth daily.      Oyster Shell (OYSTER CALCIUM) 500 MG TABS tablet Take 500 mg of elemental calcium by mouth 2 (two) times daily.     tadalafil (CIALIS) 5 MG tablet Take 1 tablet (5 mg total) by mouth daily. 90 tablet 3   zinc gluconate 50 MG tablet Take 100 mg by mouth daily.     zonisamide (ZONEGRAN) 100 MG capsule Take 1 capsule by mouth every night for 2 weeks, then increase to 2 capsules every night for 2 weeks, then increase to 3 capsules every night and continue 90 capsule 6   testosterone cypionate (DEPOTESTOSTERONE CYPIONATE) 200 MG/ML injection  INJECT 1 ML INTO THE MUSCLE EVERY 14 DAYS 10 mL 5   No facility-administered medications prior to visit.    ROS: Review of Systems  Constitutional:  Positive for fatigue. Negative for appetite change and unexpected weight change.  HENT:  Negative for congestion, nosebleeds, sneezing, sore throat and trouble swallowing.   Eyes:  Negative for itching and visual disturbance.  Respiratory:  Negative for cough.   Cardiovascular:  Positive for chest pain. Negative for palpitations and leg swelling.  Gastrointestinal:  Positive for diarrhea. Negative for abdominal distention, blood in stool and nausea.  Genitourinary:  Negative for frequency and hematuria.  Musculoskeletal:  Positive for back pain. Negative for gait problem, joint swelling and neck pain.  Skin:  Negative for rash.  Neurological:  Positive for dizziness, tremors, seizures, weakness and light-headedness. Negative for syncope and speech difficulty.  Psychiatric/Behavioral:  Positive for decreased concentration and sleep disturbance. Negative for agitation, dysphoric mood and suicidal ideas. The patient is nervous/anxious.     Objective:  BP 124/82 (BP Location: Right Arm, Patient Position: Sitting, Cuff Size: Normal)   Pulse 75   Temp 98.2 F (36.8 C) (Oral)   Ht 5\' 7"  (1.702 m)   Wt 229 lb (103.9 kg)   SpO2 98%   BMI 35.87 kg/m   BP Readings from Last 3 Encounters:  10/07/22 124/82  09/21/22 107/74  08/26/22 118/70    Wt Readings from Last 3 Encounters:  10/07/22 229 lb (103.9 kg)  09/21/22 225 lb 3.2 oz (102.2 kg)  08/26/22 221 lb (100.2 kg)    Physical Exam Constitutional:      General: He is not in acute distress.    Appearance: He is well-developed. He is obese.     Comments: NAD  Eyes:     Conjunctiva/sclera: Conjunctivae normal.     Pupils: Pupils are equal, round, and reactive to light.  Neck:     Thyroid: No thyromegaly.     Vascular: No JVD.  Cardiovascular:     Rate and Rhythm: Normal rate and  regular rhythm.     Heart sounds: Normal heart sounds. No murmur heard.    No friction rub. No gallop.  Pulmonary:     Effort: Pulmonary effort is normal. No respiratory distress.     Breath sounds: Normal breath sounds. No wheezing or rales.  Chest:     Chest wall: No tenderness.  Abdominal:     General: Bowel sounds are normal. There is no distension.     Palpations: Abdomen is soft. There is no mass.     Tenderness: There is no abdominal tenderness. There is no guarding or rebound.  Musculoskeletal:        General: No tenderness. Normal range of motion.  Cervical back: Normal range of motion.  Lymphadenopathy:     Cervical: No cervical adenopathy.  Skin:    General: Skin is warm and dry.     Findings: No rash.  Neurological:     Mental Status: He is alert and oriented to person, place, and time.     Cranial Nerves: No cranial nerve deficit.     Motor: No abnormal muscle tone.     Coordination: Coordination normal.     Gait: Gait normal.     Deep Tendon Reflexes: Reflexes are normal and symmetric.  Psychiatric:        Behavior: Behavior normal.        Thought Content: Thought content normal.        Judgment: Judgment normal.   Ples is sad.  He is having tremors  Lab Results  Component Value Date   WBC 6.4 03/09/2021   HGB 14.3 03/09/2021   HCT 44.5 03/09/2021   PLT 240.0 03/09/2021   GLUCOSE 95 02/24/2022   CHOL 183 03/09/2021   TRIG 112.0 03/09/2021   HDL 52.20 03/09/2021   LDLDIRECT 91.0 12/04/2019   LDLCALC 108 (H) 03/09/2021   ALT 27 02/24/2022   AST 17 02/24/2022   NA 141 02/24/2022   K 4.3 02/24/2022   CL 103 02/24/2022   CREATININE 1.26 02/24/2022   BUN 17 02/24/2022   CO2 28 02/24/2022   TSH 2.10 09/22/2022   PSA 0.43 02/24/2022    CT ABDOMEN PELVIS W CONTRAST  Result Date: 07/24/2019 CLINICAL DATA:  Right lower quadrant pain for 3 days, constipation EXAM: CT ABDOMEN AND PELVIS WITH CONTRAST TECHNIQUE: Multidetector CT imaging of the abdomen  and pelvis was performed using the standard protocol following bolus administration of intravenous contrast. CONTRAST:  145mL OMNIPAQUE IOHEXOL 300 MG/ML  SOLN COMPARISON:  CT abdomen pelvis 03/29/2017 FINDINGS: Lower chest: Basilar atelectatic changes. Lung bases otherwise clear. Normal heart size. No pericardial effusion. Hepatobiliary: No focal liver abnormality is seen. Prominent fold at the neck of the gallbladder. No calcified gallstones, pericholecystic inflammation or biliary ductal dilatation. Pancreas: Partial fatty replacement of the pancreas. No peripancreatic inflammation or ductal dilatation. Spleen: Normal in size without focal abnormality. Adrenals/Urinary Tract: Normal adrenal glands. 11 mm hypoattenuating simple appearing fluid attenuation cyst in the interpolar right kidney. Kidneys are otherwise unremarkable, without renal calculi, suspicious lesion, or hydronephrosis. Bladder is unremarkable. No extravasation of contrast is seen on excretory phase delayed imaging. Stomach/Bowel: Dilated air and fluid-filled appendix with peripheral hyperemia measuring up to 2.3 cm in diameter. Small amount extraluminal gas extending from the base of the appendix (3/70) as well as surrounding fluid and phlegmonous change. No discrete or organized abscess has formed at this time. The air is reactive thickening of the cecal tip as well as the adjacent ileum which courses in close proximity. No resulting obstruction in the inflamed portions of the bowel. Distal esophagus, stomach and more proximal small bowel are unremarkable. More distal colonic segments are free of acute abnormality. Vascular/Lymphatic: The aorta is normal caliber. Reactive adenopathy in the right lower quadrant. No suspicious or enlarged lymph nodes in the included lymphatic chains. Reproductive: The prostate and seminal vesicles are unremarkable. Other: There is extensive fluid and phlegmonous change centered upon the inflamed appendix in the  right lower quadrant. Additional intermediate attenuation (18-25 HU) fluid is seen in the low pelvis and left upper quadrant, likely redistributed. Additional foci of anti dependent gas are noted along the anterior peritoneal surface. Some peritoneal thickening is  noted in the right and left lower quadrants compatible with developing peritonitis. Musculoskeletal: No acute osseous abnormality or suspicious osseous lesion. IMPRESSION: 1. Perforated appendicitis with extensive fluid and phlegmonous change centered upon the inflamed appendix in the right lower quadrant. No discrete or organized abscess has formed at this time. Evidence of developing peritonitis. 2. Reactive thickening of the cecal tip as well as the adjacent ileum which courses in close proximity. No resulting obstruction in the inflamed portions of the bowel. Electronically Signed   By: Lovena Le M.D.   On: 07/24/2019 19:39   DG Abd Portable 1 View  Result Date: 07/24/2019 CLINICAL DATA:  Abdomen pain EXAM: PORTABLE ABDOMEN - 1 VIEW COMPARISON:  CT abdomen pelvis March 29, 2017 FINDINGS: The bowel gas pattern is normal. No radio-opaque calculi or other significant radiographic abnormality are seen. IMPRESSION: No bowel obstruction. Electronically Signed   By: Abelardo Diesel M.D.   On: 07/24/2019 18:42    Assessment & Plan:   Problem List Items Addressed This Visit       Digestive   IBS (irritable bowel syndrome)    IBS-d No change in symptoms On Colazal prn -it seems to help        Nervous and Auditory   Epilepsy ([place]) - Primary    Not much better.  EEG - done. Brain MRI pending F/u w/Dr Delice Lesch -move up the appointment On Zonisamide (weaning off Keppra)        Other   Stress and adjustment reaction    Ongoing problems plus new epilepsy      LOW BACK PAIN    Jaw and CP after seizures Norco prn  Potential benefits of a long term opioids use as well as potential risks (i.e. addiction risk, apnea etc) and complications  (i.e. Somnolence, constipation and others) were explained to the patient and were aknowledged.      Chronic pain disorder    Continue with Norco as needed.  Take as little as possible.  Potential benefits of a long term opioids use as well as potential risks (i.e. addiction risk, apnea etc) and complications (i.e. Somnolence, constipation and others) were explained to the patient and were aknowledged.       Chest pain, atypical    Recurrent x years - ?MSK CT calcium score test      Relevant Orders   CT CARDIAC SCORING (SELF PAY ONLY)   Anxiety disorder    Chronic  Xanax prn.  Use as little as possible  Potential benefits of a long term benzodiazepines  use as well as potential risks  and complications were explained to the patient and were aknowledged.      Other Visit Diagnoses     Dyslipidemia       Relevant Orders   CT CARDIAC SCORING (SELF PAY ONLY)         No orders of the defined types were placed in this encounter.     Follow-up: Return in about 6 weeks (around 11/18/2022) for a follow-up visit.  Walker Kehr, MD

## 2022-10-10 NOTE — Assessment & Plan Note (Signed)
Continue with Norco as needed.  Take as little as possible.  Potential benefits of a long term opioids use as well as potential risks (i.e. addiction risk, apnea etc) and complications (i.e. Somnolence, constipation and others) were explained to the patient and were aknowledged. 

## 2022-10-10 NOTE — Assessment & Plan Note (Signed)
Chronic  Xanax prn.  Use as little as possible  Potential benefits of a long term benzodiazepines  use as well as potential risks  and complications were explained to the patient and were aknowledged.

## 2022-10-12 ENCOUNTER — Ambulatory Visit
Admission: RE | Admit: 2022-10-12 | Discharge: 2022-10-12 | Disposition: A | Discharge status: Home / Self Care | Payer: 59 | Source: Ambulatory Visit | Attending: Neurology | Admitting: Neurology

## 2022-10-12 DIAGNOSIS — R569 Unspecified convulsions: Secondary | ICD-10-CM | POA: Diagnosis not present

## 2022-10-12 MED ORDER — GADOPICLENOL 0.5 MMOL/ML IV SOLN
10.0000 mL | Freq: Once | INTRAVENOUS | Status: AC | PRN
  Administered 2022-10-12: 10 mL via INTRAVENOUS

## 2022-10-15 ENCOUNTER — Ambulatory Visit (HOSPITAL_COMMUNITY)
Admission: RE | Admit: 2022-10-15 | Discharge: 2022-10-15 | Disposition: A | Discharge status: Home / Self Care | Payer: 59 | Source: Ambulatory Visit | Attending: Internal Medicine | Admitting: Internal Medicine

## 2022-10-15 DIAGNOSIS — R0789 Other chest pain: Secondary | ICD-10-CM | POA: Insufficient documentation

## 2022-10-15 DIAGNOSIS — E785 Hyperlipidemia, unspecified: Secondary | ICD-10-CM | POA: Insufficient documentation

## 2022-10-17 ENCOUNTER — Encounter: Payer: Self-pay | Admitting: Internal Medicine

## 2022-10-17 ENCOUNTER — Other Ambulatory Visit: Payer: Self-pay | Admitting: Internal Medicine

## 2022-10-17 DIAGNOSIS — I2510 Atherosclerotic heart disease of native coronary artery without angina pectoris: Secondary | ICD-10-CM | POA: Insufficient documentation

## 2022-10-17 MED ORDER — ASPIRIN 81 MG PO TBEC: 81.0000 mg | DELAYED_RELEASE_TABLET | Freq: Every day | ORAL | 3 refills | Status: AC

## 2022-10-20 ENCOUNTER — Other Ambulatory Visit: Payer: Self-pay | Admitting: Internal Medicine

## 2022-10-20 ENCOUNTER — Other Ambulatory Visit: Payer: Self-pay

## 2022-10-21 ENCOUNTER — Other Ambulatory Visit: Payer: Self-pay

## 2022-10-21 ENCOUNTER — Other Ambulatory Visit (HOSPITAL_COMMUNITY): Payer: Self-pay

## 2022-10-21 MED ORDER — HYDROCODONE-ACETAMINOPHEN 10-325 MG PO TABS
1.0000 | ORAL_TABLET | Freq: Four times a day (QID) | ORAL | 0 refills | Status: DC | PRN
  Filled 2022-10-21: qty 120, 30d supply, fill #0

## 2022-10-25 ENCOUNTER — Other Ambulatory Visit (HOSPITAL_COMMUNITY): Payer: Self-pay

## 2022-10-27 ENCOUNTER — Other Ambulatory Visit (HOSPITAL_COMMUNITY): Payer: Self-pay

## 2022-10-27 ENCOUNTER — Other Ambulatory Visit: Payer: Self-pay | Admitting: Internal Medicine

## 2022-10-27 DIAGNOSIS — E669 Obesity, unspecified: Secondary | ICD-10-CM | POA: Diagnosis not present

## 2022-10-27 DIAGNOSIS — I10 Essential (primary) hypertension: Secondary | ICD-10-CM | POA: Diagnosis not present

## 2022-11-01 ENCOUNTER — Other Ambulatory Visit: Payer: Self-pay

## 2022-11-01 ENCOUNTER — Other Ambulatory Visit (HOSPITAL_COMMUNITY): Payer: Self-pay

## 2022-11-01 DIAGNOSIS — Z0279 Encounter for issue of other medical certificate: Secondary | ICD-10-CM

## 2022-11-01 MED ORDER — AMPHETAMINE-DEXTROAMPHETAMINE 10 MG PO TABS
10.0000 mg | ORAL_TABLET | Freq: Two times a day (BID) | ORAL | 0 refills | Status: DC
  Filled 2022-11-01: qty 60, 30d supply, fill #0

## 2022-11-02 ENCOUNTER — Other Ambulatory Visit: Payer: Self-pay

## 2022-11-02 ENCOUNTER — Other Ambulatory Visit (HOSPITAL_COMMUNITY): Payer: Self-pay

## 2022-11-03 ENCOUNTER — Other Ambulatory Visit: Payer: Self-pay

## 2022-11-05 ENCOUNTER — Ambulatory Visit (INDEPENDENT_AMBULATORY_CARE_PROVIDER_SITE_OTHER): Payer: 59 | Admitting: Neurology

## 2022-11-05 ENCOUNTER — Encounter: Payer: Self-pay | Admitting: Neurology

## 2022-11-05 VITALS — BP 124/83 | HR 88 | Ht 67.0 in | Wt 220.6 lb

## 2022-11-05 DIAGNOSIS — R9089 Other abnormal findings on diagnostic imaging of central nervous system: Secondary | ICD-10-CM

## 2022-11-05 DIAGNOSIS — G40009 Localization-related (focal) (partial) idiopathic epilepsy and epileptic syndromes with seizures of localized onset, not intractable, without status epilepticus: Secondary | ICD-10-CM | POA: Diagnosis not present

## 2022-11-05 MED ORDER — VALTOCO 20 MG DOSE 10 MG/0.1ML NA LQPK
NASAL | 5 refills | Status: AC
  Filled 2022-11-05: qty 4, 30d supply, fill #0
  Filled 2022-11-09: qty 4, 15d supply, fill #0

## 2022-11-05 MED ORDER — ZONISAMIDE 100 MG PO CAPS
400.0000 mg | ORAL_CAPSULE | Freq: Every evening | ORAL | 3 refills | Status: DC
  Filled 2022-11-05: qty 360, fill #0
  Filled 2022-11-08 – 2022-12-03 (×2): qty 360, 90d supply, fill #0
  Filled 2023-03-16: qty 360, 90d supply, fill #1
  Filled ????-??-??: fill #0

## 2022-11-05 NOTE — Progress Notes (Unsigned)
NEUROLOGY FOLLOW UP OFFICE NOTE  Mark Mcgee [id number] March 14, 1966  HISTORY OF PRESENT ILLNESS: I had the pleasure of seeing Mark Mcgee in follow-up in the neurology clinic on 11/05/2022.  The patient was last seen 6 weeks ago for new onset seizures that started 08/21/2022. He is alone in the office today. Records and images were personally reviewed where available. I personally reviewed MRI brain with and without contrast done 09/2022 which did not show any acute changes. There was note of moderate prominent enlargement of the atria and occipital horns of the right greater than left lateral ventricles with surrounding white matter volume loss. Hippocampi symmetric. EEG in 08/2022 was normal. He had side effects on Levetiracetam and was started on Zonisamide in 07/2022, currently on 362m qhs. He had contacted our office on 09/28/22 about seizures while uptitrating Zonisamide. He has weaned off Levetiracetam and is tolerating ZNS better.   He brings his seizure calendar. He had a nocturnal seizure at 1:41am on 1/8, 1am on 1/9, 1am on 1/11, lasting 2-3 minutes. On 1/27, he had a nocturnal seizure at 12:48am, then another at 1:15am and was very disoriented. Last seizure was 2/5 at 5am, he had an odd headache and got up from his desk then had a witnessed 2 minute seizure. Afterwards he tried to work but could not recall his computer password for an hour of so. He is worried about the cognitive changes, he is having much difficulty with things he knows/should know. He is falling behind at work, having to research things he typically knows. He knows what he wants to say but has difficulty. After the last seizure, he did not recognize his partner. Anxiety has been getting worse, he has not been going out due to fear of having a seizure.    History on Initial Assessment 09/21/2022: This is a pleasant 57year old right-handed man with a history of hypertension, hyperthyroidism, IBS, anxiety, tremor,  presenting for evaluation of new onset seizures. He denies any prior history of seizures until 08/21/22 when he had 3 seizures in one day. He was stressed out after putting his dog to sleep. He recalls driving home and pulling into the driveway but pulled in crooked. His right leg was slamming the break and he could not let go, he knew what was going on but could not do anything. He felt like he was stuck. His friend in the car with him was able to stop the car. He was able to get out of the car and then his friend witnessed a generalized convulsion. He had shaking so bad it dented the side of his car with his arm. His friend lay him on his side, then he woke up and recalls getting up and walking around the car, then he felt like he was being choked then had another seizure. After this, he recalls getting up and going in the house then proceeded to have a third convulsion where he had urinary incontinence then vomited after. When EMS arrived, he was mildly confused with a hard time finding words. He recalls feeling very jittery, no focal weakness. He was brought to the ER where bloodwork showed a WBC of 15, creatinine 1.33. EKG NSR. Head CT no acute changes. UDS positive for opiates and THC. He is on hydrocodone for chronic jaw pain and using CBD gummies to help with sleep/anxiety. Since then, he has had at least 5 more episodes of loss of consciousness where he wakes up on the floor. Last  episode was last night, he went to the mantel to fix something then woke up on the floor. There was one witnessed incident 2 weeks ago where his friend told him he was staring and unresponsive for 5 minutes. He gets flashes in his eyes before the episodes, then when he wakes up it feels like he went through a workout at the gym. His chest muscles still hurt. Since then, he has also had spasms where what he is holding in his hand goes flying out. He loses his train of thought a lot. Since the initial seizures, he could not recall  how to do things at work that he had been doing for over 6 years (works with [employer]). He feels like his brain is scrambled. He has not prior history of headaches but since the seizures has had constant headaches with pressure on both side, 2/10 in intensity today, no nausea/vomiting. He is sensitive to lights. He has had loss of appetite. He has bilateral tinnitus, worse at night. Masking has not helped. HE is not sleeping well, getting 4-6 hours of sleep. Prior to the seizures, he was taking alprazolam around 2 times a week, but since the seizures he has had a constant nervousness and worsening hand tremors and taking 1/4-1/2 tablet daily. His PCP started Levetiracetam 567m BID which is making him sleepy. He denies any dizziness, diplopia, neck/back pain, bowel/bladder dysfunction. He was born 3 months premature. 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 He had a normal early development.  There is no history of febrile convulsions, CNS infections such as meningitis/encephalitis, significant traumatic brain injury, neurosurgical procedures, or family history of seizures. His sister also has tremors.   PAST MEDICAL HISTORY: Past Medical History:  Diagnosis Date   Anxiety    Costochondritis    recurrent    Depression    Dr LSabra Heck  Fatigue 2009   Genital herpes 2009   GERD (gastroesophageal reflux disease)    Heat stroke    HTN (hypertension)    Hyperthyroidism    IBS (irritable bowel syndrome)    Panic attack    Prostatitis    Very painful flare-ups Dr DDiona Fanti  Tremor     MEDICATIONS: Current Outpatient Medications on File Prior to Visit  Medication Sig Dispense Refill   acetaminophen (TYLENOL) 325 MG tablet Take 325-650 mg by mouth every 6 (six) hours as needed for mild pain or headache.      acyclovir (ZOVIRAX) 400 MG tablet Take 1 tablet (400 mg total) by mouth 4 (four) times daily as needed (as directed for outbreaks). 60 tablet 3   albuterol (VENTOLIN HFA) 108  (90 Base) MCG/ACT inhaler Inhale 2 puffs into the lungs every 4 (four) hours as needed for wheezing or shortness of breath. 18 g 5   alprazolam (XANAX) 2 MG tablet Take 1/2 tablet by mouth 4 times daily as needed for sleep or anxiety. 120 tablet 1   amLODipine-olmesartan (AZOR) 10-40 MG tablet TAKE 1 TABLET BY MOUTH ONCE DAILY 90 tablet 3   amphetamine-dextroamphetamine (ADDERALL) 10 MG tablet Take 1 tablet (10 mg total) by mouth 2 (two) times daily with breakfast and lunch. 60 tablet 0   aspirin EC 81 MG tablet Take 1 tablet (81 mg total) by mouth daily. 100 tablet 3   b complex vitamins tablet Take 1 tablet by mouth daily.     balsalazide (COLAZAL) 750 MG capsule Take 3 capsules (2,250 mg total) by  mouth 3 (three) times daily. 270 capsule 3   Cholecalciferol (VITAMIN D3) 50 MCG (2000 UT) TABS Take 2,000 Units by mouth daily with breakfast.     diphenhydrAMINE (BENADRYL) 25 mg capsule Take 25 mg by mouth every 6 (six) hours as needed (for seasonal allergies).     diphenoxylate-atropine (LOMOTIL) 2.5-0.025 MG tablet TAKE 1 OR 2 TABLETS BY MOUTH 4 TIMES DAILY AS NEEDED FOR DIARRHEA OR LOOSE STOOLS MAX OF 8 TABLETS PER DAY (Patient taking differently: Take 1-2 tablets by mouth 4 (four) times daily as needed for diarrhea or loose stools (MAX OF 8 TABLETS/24 HOURS).) 60 tablet 3   Flaxseed, Linseed, (FLAX SEEDS) POWD 2 (two) times daily as needed (as directed when mixing into health shakes).     Fluticasone-Umeclidin-Vilant (TRELEGY ELLIPTA) 100-62.5-25 MCG/ACT AEPB Inhale 1 puff into the lungs daily. 60 each 11   glycopyrrolate (ROBINUL) 2 MG tablet Take 1 tablet (2 mg total) by mouth 2 (two) times daily. (Patient taking differently: Take 2 mg by mouth 2 (two) times daily as needed (spasms).) 180 tablet 0   HYDROcodone-acetaminophen (NORCO) 10-325 MG tablet Take 1 tablet by mouth every 6 (six) hours as needed for pain 120 tablet 0   ipratropium (ATROVENT) 0.03 % nasal spray Place 2 sprays into both  nostrils 2 (two) times daily. (Patient taking differently: Place 2 sprays into both nostrils 2 (two) times daily as needed (for seasonal allergies).) 30 mL 5   levETIRAcetam (KEPPRA) 500 MG tablet Take 1 tablet (500 mg total) by mouth 2 (two) times daily. 60 tablet 3   loperamide (IMODIUM) 2 MG capsule Take 2 mg by mouth as needed for diarrhea or loose stools.     Multiple Vitamin (MULTIVITAMIN) capsule Take 1 capsule by mouth daily.      Oyster Shell (OYSTER CALCIUM) 500 MG TABS tablet Take 500 mg of elemental calcium by mouth 2 (two) times daily.     tadalafil (CIALIS) 5 MG tablet Take 1 tablet (5 mg total) by mouth daily. 90 tablet 3   testosterone cypionate (DEPOTESTOSTERONE CYPIONATE) 200 MG/ML injection INJECT 1 ML INTO THE MUSCLE EVERY 14 DAYS 10 mL 5   zinc gluconate 50 MG tablet Take 100 mg by mouth daily.     zonisamide (ZONEGRAN) 100 MG capsule Take 1 capsule by mouth every night for 2 weeks, then increase to 2 capsules every night for 2 weeks, then increase to 3 capsules every night and continue 90 capsule 6   No current facility-administered medications on file prior to visit.    ALLERGIES: Allergies  Allergen Reactions   Buspirone Hcl Other (See Comments)    Reaction not recalled by the patient   Divalproex Sodium Other (See Comments)    Severe headaches   Gluten Meal Other (See Comments)   Olanzapine-Fluoxetine Hcl Other (See Comments)    Reaction not recalled by the patient   Other Other (See Comments)   Piroxicam Other (See Comments)    Reaction not recalled by the patient   Wheat Bran Diarrhea and Other (See Comments)    "Tears up my stomach"   Ibuprofen Rash and Other (See Comments)    Spots appear on chest appear    FAMILY HISTORY: Family History  Problem Relation Age of Onset   Hyperlipidemia Other    Coronary artery disease Other    Stroke Mother    Colon cancer Paternal Grandmother    Diabetes Other    Breast cancer Other    Pancreatic cancer Other  Cancer Other        breast, pancreatic   Heart disease Other    Cancer Maternal Grandmother        colon    SOCIAL HISTORY: Social History   Socioeconomic History   Marital status: Single    Spouse name: Not on file   Number of children: Not on file   Years of education: Not on file   Highest education level: Not on file  Occupational History   Occupation: Programmer, multimedia: [employer]  Tobacco Use   Smoking status: Never   Smokeless tobacco: Never  Vaping Use   Vaping Use: Never used  Substance and Sexual Activity   Alcohol use: Yes    Comment: 2-6 beers every 2/wks   Drug use: No   Sexual activity: Yes  Other Topics Concern   Not on file  Social History Narrative   Are you right handed or left handed? right   Are you currently employed ? 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 no   Who lives with you? Room mate   What type of home do you live in: 1 story or 2 story? 1.5 story        Social Determinants of Health   Financial Resource Strain: Not on file  Food Insecurity: Not on file  Transportation Needs: Not on file  Physical Activity: Not on file  Stress: Not on file  Social Connections: Not on file  Intimate Partner Violence: Not on file     PHYSICAL EXAM: Vitals:   11/05/22 1551  BP: 124/83  Pulse: 88  SpO2: 100%   General: No acute distress Head:  Normocephalic/atraumatic Skin/Extremities: No rash, no edema Neurological Exam: alert and awake. No aphasia or dysarthria. Fund of knowledge is appropriate. Attention and concentration are normal.   Cranial nerves: Pupils equal, round. Extraocular movements intact with no nystagmus. Visual fields full.  No facial asymmetry.  Motor: Bulk and tone normal, muscle strength 5/5 throughout with no pronator drift.   Finger to nose testing intact.  Gait narrow-based and steady. +bilateral high frequency, low amplitude postural tremor. No resting  tremor.   IMPRESSION: This is a pleasant 57 yo RH man with a history of hypertension, hyperthyroidism, IBS, anxiety, tremor, with new onset seizures since 08/21/2022. 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 MRI brain no acute changes however there is note of moderate prominent enlargement of the atria and occipital horns of the right greater than left lateral ventricles, hippocampi symmetric. EEG normal. The MRI findings are concerning, recommend doing a lumbar puncture and measuring opening pressure, as well as sending CSF for routine studies as well as autoimmune epilepsy panel. He will be scheduled for a 72-hour ambulatory EEG for seizure classification. Increase Zonisamide to 453m qhs. A prescription for Valtoco nasal spray was given for seizure rescue, side effects discussed. He is not driving. Follow-up after tests, call for any changes.   Thank you for allowing me to participate in his care.  Please do not hesitate to call for any questions or concerns.    KEllouise Newer M.D.   CC: Dr. PAlain Marion

## 2022-11-05 NOTE — Patient Instructions (Signed)
Increase Zonisamide 144m: take 4 capsules every night  2. A prescription for Valtoco nasal spray has been sent to your pharmacy for seizure rescue  3. Schedule lumbar puncture under fluoroscopy  4. Schedule 3-day home EEG  5. Follow-up after tests, call for any changes   Seizure Precautions: 1. If medication has been prescribed for you to prevent seizures, take it exactly as directed.  Do not stop taking the medicine without talking to your doctor first, even if you have not had a seizure in a long time.   2. Avoid activities in which a seizure would cause danger to yourself or to others.  Don't operate dangerous machinery, swim alone, or climb in high or dangerous places, such as on ladders, roofs, or girders.  Do not drive unless your doctor says you may.  3. If you have any warning that you may have a seizure, lay down in a safe place where you can't hurt yourself.    4.  No driving for 6 months from last seizure, as [facility]   Please refer to the following link on the [place] for more information: http://[email or web address]/[email or web address]/[email or web address]   5.  Maintain good sleep hygiene. Avoid alcohol.  6.  Contact your doctor if you have any problems that may be related to the medicine you are taking.  7.  Call 911 and bring the patient back to the ED if:        A.  The seizure lasts longer than 5 minutes.       B.  The patient doesn't awaken shortly after the seizure  C.  The patient has new problems such as difficulty seeing, speaking or moving  D.  The patient was injured during the seizure  E.  The patient has a temperature over 102 F (39C)  F.  The patient vomited and now is having trouble breathing

## 2022-11-06 ENCOUNTER — Other Ambulatory Visit (HOSPITAL_COMMUNITY): Payer: Self-pay

## 2022-11-08 ENCOUNTER — Other Ambulatory Visit (HOSPITAL_COMMUNITY): Payer: Self-pay

## 2022-11-08 ENCOUNTER — Other Ambulatory Visit: Payer: Self-pay

## 2022-11-09 ENCOUNTER — Other Ambulatory Visit (HOSPITAL_COMMUNITY): Payer: Self-pay

## 2022-11-10 ENCOUNTER — Other Ambulatory Visit: Payer: Self-pay

## 2022-11-11 ENCOUNTER — Other Ambulatory Visit: Payer: Self-pay

## 2022-11-11 ENCOUNTER — Encounter: Payer: Self-pay | Admitting: Internal Medicine

## 2022-11-11 ENCOUNTER — Other Ambulatory Visit (HOSPITAL_COMMUNITY): Payer: Self-pay

## 2022-11-11 DIAGNOSIS — G40009 Localization-related (focal) (partial) idiopathic epilepsy and epileptic syndromes with seizures of localized onset, not intractable, without status epilepticus: Secondary | ICD-10-CM

## 2022-11-11 DIAGNOSIS — R9089 Other abnormal findings on diagnostic imaging of central nervous system: Secondary | ICD-10-CM

## 2022-11-12 ENCOUNTER — Other Ambulatory Visit (HOSPITAL_COMMUNITY): Payer: Self-pay

## 2022-11-12 NOTE — Telephone Encounter (Signed)
Pt has stated "you saw my partner Mark Mcgee dob [birth year]. a few times so he thought he was established with you and he needs to see you for an appointment however when he called they told he no and would not ask you. Would you be able to still see him. Mainly check up overal, and DM type 2 he has been off insulin doing well however may need to be back on so may need referral ? He would really like to see you. I have an appointment on Feb 22 at 8:00. He has to drive me if you have something that morning that would be great however for him anytime would be good. He tried as I said calling and even asked if they would ask you and they said no it was too long since last appointment yet he hasn't needed one until now. I would appreciate it so would he Thank you so much."  Would you be okay to see Mark Mcgee, 04/28/1969 again as a re-established care/New Pt?

## 2022-11-15 ENCOUNTER — Other Ambulatory Visit: Payer: Self-pay

## 2022-11-18 ENCOUNTER — Ambulatory Visit: Payer: 59 | Admitting: Internal Medicine

## 2022-11-18 ENCOUNTER — Encounter: Payer: Self-pay | Admitting: Internal Medicine

## 2022-11-18 VITALS — BP 110/70 | HR 99 | Temp 99.1°F | Ht 67.0 in | Wt 217.0 lb

## 2022-11-18 DIAGNOSIS — G40909 Epilepsy, unspecified, not intractable, without status epilepticus: Secondary | ICD-10-CM | POA: Diagnosis not present

## 2022-11-18 DIAGNOSIS — F988 Other specified behavioral and emotional disorders with onset usually occurring in childhood and adolescence: Secondary | ICD-10-CM | POA: Diagnosis not present

## 2022-11-18 DIAGNOSIS — G894 Chronic pain syndrome: Secondary | ICD-10-CM | POA: Diagnosis not present

## 2022-11-18 DIAGNOSIS — M5440 Lumbago with sciatica, unspecified side: Secondary | ICD-10-CM | POA: Diagnosis not present

## 2022-11-18 DIAGNOSIS — G8929 Other chronic pain: Secondary | ICD-10-CM

## 2022-11-18 NOTE — Assessment & Plan Note (Signed)
On Zonisamide 72 h EEG, LP is pending F/u w/Dt Delice Lesch

## 2022-11-18 NOTE — Assessment & Plan Note (Signed)
Chronic Norco prn  Potential benefits of a long term opioids use as well as potential risks (i.e. addiction risk, apnea etc) and complications (i.e. Somnolence, constipation and others) were explained to the patient and were aknowledged. 

## 2022-11-18 NOTE — Assessment & Plan Note (Signed)
Continue with Norco as needed.  Take as little as possible.  Potential benefits of a long term opioids use as well as potential risks (i.e. addiction risk, apnea etc) and complications (i.e. Somnolence, constipation and others) were explained to the patient and were aknowledged.

## 2022-11-18 NOTE — Progress Notes (Signed)
Subjective:  Patient ID: Mark Mcgee, male    DOB: 1966/05/05  Age: 57 y.o. MRN: [id number]  CC: No chief complaint on file.   HPI Mark Mcgee presents for seizure disorder, CFS, ADD, jaw pain. Last seizure yesterday x 6 min  Outpatient Medications Prior to Visit  Medication Sig Dispense Refill   acetaminophen (TYLENOL) 325 MG tablet Take 325-650 mg by mouth every 6 (six) hours as needed for mild pain or headache.      acyclovir (ZOVIRAX) 400 MG tablet Take 1 tablet (400 mg total) by mouth 4 (four) times daily as needed (as directed for outbreaks). 60 tablet 3   albuterol (VENTOLIN HFA) 108 (90 Base) MCG/ACT inhaler Inhale 2 puffs into the lungs every 4 (four) hours as needed for wheezing or shortness of breath. 18 g 5   alprazolam (XANAX) 2 MG tablet Take 1/2 tablet by mouth 4 times daily as needed for sleep or anxiety. 120 tablet 1   amLODipine-olmesartan (AZOR) 10-40 MG tablet TAKE 1 TABLET BY MOUTH ONCE DAILY 90 tablet 3   amphetamine-dextroamphetamine (ADDERALL) 10 MG tablet Take 1 tablet (10 mg total) by mouth 2 (two) times daily with breakfast and lunch. (Patient taking differently: Take 10 mg by mouth daily.) 60 tablet 0   aspirin EC 81 MG tablet Take 1 tablet (81 mg total) by mouth daily. 100 tablet 3   b complex vitamins tablet Take 1 tablet by mouth daily.     Cholecalciferol (VITAMIN D3) 50 MCG (2000 UT) TABS Take 2,000 Units by mouth daily with breakfast.     diazePAM, 20 MG Dose, (VALTOCO 20 MG DOSE) 2 x 10 MG/0.1ML LQPK Spray in nose as instructed for seizure. May use second dose after 4 hours. 4 each 5   diphenhydrAMINE (BENADRYL) 25 mg capsule Take 25 mg by mouth every 6 (six) hours as needed (for seasonal allergies).     diphenoxylate-atropine (LOMOTIL) 2.5-0.025 MG tablet TAKE 1 OR 2 TABLETS BY MOUTH 4 TIMES DAILY AS NEEDED FOR DIARRHEA OR LOOSE STOOLS MAX OF 8 TABLETS PER DAY (Patient taking differently: Take 1-2 tablets by mouth 4 (four) times daily as needed for  diarrhea or loose stools (MAX OF 8 TABLETS/24 HOURS).) 60 tablet 3   Flaxseed, Linseed, (FLAX SEEDS) POWD 2 (two) times daily as needed (as directed when mixing into health shakes).     Fluticasone-Umeclidin-Vilant (TRELEGY ELLIPTA) 100-62.5-25 MCG/ACT AEPB Inhale 1 puff into the lungs daily. 60 each 11   HYDROcodone-acetaminophen (NORCO) 10-325 MG tablet Take 1 tablet by mouth every 6 (six) hours as needed for pain 120 tablet 0   ipratropium (ATROVENT) 0.03 % nasal spray Place 2 sprays into both nostrils 2 (two) times daily. 30 mL 5   loperamide (IMODIUM) 2 MG capsule Take 2 mg by mouth as needed for diarrhea or loose stools.     Multiple Vitamin (MULTIVITAMIN) capsule Take 1 capsule by mouth daily.      zinc gluconate 50 MG tablet Take 100 mg by mouth daily.     zonisamide (ZONEGRAN) 100 MG capsule Take 4 capsules (400 mg total) by mouth every night. 360 capsule 3   glycopyrrolate (ROBINUL) 2 MG tablet Take 1 tablet (2 mg total) by mouth 2 (two) times daily. (Patient not taking: Reported on 11/18/2022) 180 tablet 0   Oyster Shell (OYSTER CALCIUM) 500 MG TABS tablet Take 500 mg of elemental calcium by mouth 2 (two) times daily. (Patient not taking: Reported on 11/05/2022)  tadalafil (CIALIS) 5 MG tablet Take 1 tablet (5 mg total) by mouth daily. (Patient not taking: Reported on 11/05/2022) 90 tablet 3   testosterone cypionate (DEPOTESTOSTERONE CYPIONATE) 200 MG/ML injection INJECT 1 ML INTO THE MUSCLE EVERY 14 DAYS 10 mL 5   No facility-administered medications prior to visit.    ROS: Review of Systems  Constitutional:  Negative for appetite change, fatigue and unexpected weight change.  HENT:  Negative for congestion, nosebleeds, sneezing, sore throat and trouble swallowing.   Eyes:  Negative for itching and visual disturbance.  Respiratory:  Negative for cough.   Cardiovascular:  Negative for chest pain, palpitations and leg swelling.  Gastrointestinal:  Positive for diarrhea. Negative for  abdominal distention, blood in stool and nausea.  Genitourinary:  Negative for frequency and hematuria.  Musculoskeletal:  Positive for back pain. Negative for gait problem, joint swelling and neck pain.  Skin:  Negative for rash.  Neurological:  Positive for tremors and seizures. Negative for dizziness, speech difficulty and weakness.  Psychiatric/Behavioral:  Negative for agitation, dysphoric mood, sleep disturbance and suicidal ideas. The patient is nervous/anxious.     Objective:  BP 110/70 (BP Location: Left Arm, Patient Position: Sitting, Cuff Size: Normal)   Pulse 99   Temp 99.1 F (37.3 C) (Oral)   Ht 5' 7"$  (1.702 m)   Wt 217 lb (98.4 kg)   SpO2 95%   BMI 33.99 kg/m   BP Readings from Last 3 Encounters:  11/18/22 110/70  11/05/22 124/83  10/07/22 124/82    Wt Readings from Last 3 Encounters:  11/18/22 217 lb (98.4 kg)  11/05/22 220 lb 9.6 oz (100.1 kg)  10/07/22 229 lb (103.9 kg)    Physical Exam Constitutional:      General: He is not in acute distress.    Appearance: He is well-developed. He is obese.     Comments: NAD  Eyes:     Conjunctiva/sclera: Conjunctivae normal.     Pupils: Pupils are equal, round, and reactive to light.  Neck:     Thyroid: No thyromegaly.     Vascular: No JVD.  Cardiovascular:     Rate and Rhythm: Normal rate and regular rhythm.     Heart sounds: Normal heart sounds. No murmur heard.    No friction rub. No gallop.  Pulmonary:     Effort: Pulmonary effort is normal. No respiratory distress.     Breath sounds: Normal breath sounds. No wheezing or rales.  Chest:     Chest wall: No tenderness.  Abdominal:     General: Bowel sounds are normal. There is no distension.     Palpations: Abdomen is soft. There is no mass.     Tenderness: There is no abdominal tenderness. There is no guarding or rebound.  Musculoskeletal:        General: No tenderness. Normal range of motion.     Cervical back: Normal range of motion.     Right lower  leg: No edema.     Left lower leg: No edema.  Lymphadenopathy:     Cervical: No cervical adenopathy.  Skin:    General: Skin is warm and dry.     Findings: No rash.  Neurological:     Mental Status: He is alert and oriented to person, place, and time.     Cranial Nerves: No cranial nerve deficit.     Motor: No abnormal muscle tone.     Coordination: Coordination normal.     Gait: Gait normal.  Deep Tendon Reflexes: Reflexes are normal and symmetric.  Psychiatric:        Behavior: Behavior normal.        Thought Content: Thought content normal.        Judgment: Judgment normal.     Lab Results  Component Value Date   WBC 6.4 03/09/2021   HGB 14.3 03/09/2021   HCT 44.5 03/09/2021   PLT 240.0 03/09/2021   GLUCOSE 95 02/24/2022   CHOL 183 03/09/2021   TRIG 112.0 03/09/2021   HDL 52.20 03/09/2021   LDLDIRECT 91.0 12/04/2019   LDLCALC 108 (H) 03/09/2021   ALT 27 02/24/2022   AST 17 02/24/2022   NA 141 02/24/2022   K 4.3 02/24/2022   CL 103 02/24/2022   CREATININE 1.26 02/24/2022   BUN 17 02/24/2022   CO2 28 02/24/2022   TSH 2.10 09/22/2022   PSA 0.43 02/24/2022    CT CARDIAC SCORING (SELF PAY ONLY)  Addendum Date: 10/15/2022   ADDENDUM REPORT: 10/15/2022 16:54 CLINICAL DATA:  Cardiovascular Disease Risk stratification EXAM: Coronary Calcium Score TECHNIQUE: A gated, non-contrast computed tomography scan of the heart was performed using 3 mm slice thickness. Axial images were analyzed on a dedicated workstation. Calcium scoring of the coronary arteries was performed using the Agatston method. FINDINGS: Coronary arteries: Normal origins. Coronary Calcium Score: Left main: 0 Left anterior descending artery: 142 Left circumflex artery: 0 Right coronary artery: 0 Total: 142 Percentile: 84th Pericardium: Normal. Aorta: Normal caliber. Non-cardiac: See separate report from [facility]. IMPRESSION: Coronary calcium score of 142. This was 84th percentile for age-, race-,  and sex-matched controls. RECOMMENDATIONS: Coronary artery calcium (CAC) score is a strong predictor of incident coronary heart disease (CHD) and provides predictive information beyond traditional risk factors. CAC scoring is reasonable to use in the decision to withhold, postpone, or initiate statin therapy in intermediate-risk or selected borderline-risk asymptomatic adults (age 71-75 years and LDL-C >=70 to <190 mg/dL) who do not have diabetes or established atherosclerotic cardiovascular disease (ASCVD).* In intermediate-risk (10-year ASCVD risk >=7.5% to <20%) adults or selected borderline-risk (10-year ASCVD risk >=5% to <7.5%) adults in whom a CAC score is measured for the purpose of making a treatment decision the following recommendations have been made: If CAC=0, it is reasonable to withhold statin therapy and reassess in 5 to 10 years, as long as higher risk conditions are absent (diabetes mellitus, family history of premature CHD in first degree relatives (males <55 years; females <65 years), cigarette smoking, or LDL >=190 mg/dL). If CAC is 1 to 99, it is reasonable to initiate statin therapy for patients >=80 years of age. If CAC is >=100 or >=75th percentile, it is reasonable to initiate statin therapy at any age. Cardiology referral should be considered for patients with CAC scores >=400 or >=75th percentile. *2018 AHA/ACC/AACVPR/AAPA/ABC/ACPM/ADA/AGS/APhA/ASPC/NLA/PCNA Guideline on the Management of Blood Cholesterol: A Report of the American College of Cardiology/American Heart Association Task Force on Clinical Practice Guidelines. J Am Coll Cardiol. 2019;73(24):3168-3209. Lyman Bishop, MD Electronically Signed   By: Pixie Casino M.D.   On: 10/15/2022 16:54   Result Date: 10/15/2022 EXAM: OVER-READ INTERPRETATION  CT CHEST The following report is a limited chest CT over-read performed by radiologist Dr. Dahlia Bailiff of [facility], [place] on 10/15/2022. This over-read does not include  interpretation of cardiac or coronary anatomy or pathology. The calcium score CT interpretation by the cardiologist is attached. COMPARISON:  None Available. FINDINGS: Vascular: No acute noncardiac vascular finding. Mediastinum/Nodes: No pathologically enlarged mediastinal  or hilar lymph nodes. Visualized portions of the esophagus are grossly unremarkable. Lungs/Pleura: Within the visualized portions of the chest there are no suspicious pulmonary nodules or masses, no focal airspace consolidation and no pleural effusion or pneumothorax. Upper Abdomen: No acute abnormality. Musculoskeletal: No acute osseous abnormality. IMPRESSION: No significant incidental noncardiac findings. Electronically Signed: By: Dahlia Bailiff M.D. On: 10/15/2022 11:43    Assessment & Plan:   Problem List Items Addressed This Visit       Nervous and Auditory   Epilepsy ([place]) - Primary    On Zonisamide 72 h EEG, LP is pending F/u w/Dt Delice Lesch        Other   LOW BACK PAIN    Chronic Norco prn  Potential benefits of a long term opioids use as well as potential risks (i.e. addiction risk, apnea etc) and complications (i.e. Somnolence, constipation and others) were explained to the patient and were aknowledged.      Chronic pain disorder    Continue with Norco as needed.  Take as little as possible.  Potential benefits of a long term opioids use as well as potential risks (i.e. addiction risk, apnea etc) and complications (i.e. Somnolence, constipation and others) were explained to the patient and were aknowledged.      ADD (attention deficit disorder) without hyperactivity    Chronic Adderall Lions mane  Potential benefits of a long term stimulants  use as well as potential risks  and complications were explained to the patient and were aknowledged.         No orders of the defined types were placed in this encounter.     Follow-up: Return in about 2 months (around 01/17/2023) for a follow-up  visit.  Walker Kehr, MD

## 2022-11-18 NOTE — Assessment & Plan Note (Signed)
Chronic Adderall Lions mane  Potential benefits of a long term stimulants  use as well as potential risks  and complications were explained to the patient and were aknowledged.

## 2022-11-19 ENCOUNTER — Other Ambulatory Visit: Payer: 59

## 2022-11-24 DIAGNOSIS — E669 Obesity, unspecified: Secondary | ICD-10-CM | POA: Diagnosis not present

## 2022-11-24 DIAGNOSIS — I10 Essential (primary) hypertension: Secondary | ICD-10-CM | POA: Diagnosis not present

## 2022-11-29 ENCOUNTER — Ambulatory Visit
Admission: RE | Admit: 2022-11-29 | Discharge: 2022-11-29 | Disposition: A | Discharge status: Home / Self Care | Payer: 59 | Source: Ambulatory Visit | Attending: Neurology | Admitting: Neurology

## 2022-11-29 VITALS — BP 136/89 | HR 75

## 2022-11-29 DIAGNOSIS — R9089 Other abnormal findings on diagnostic imaging of central nervous system: Secondary | ICD-10-CM

## 2022-11-29 DIAGNOSIS — R569 Unspecified convulsions: Secondary | ICD-10-CM | POA: Diagnosis not present

## 2022-11-29 DIAGNOSIS — G40009 Localization-related (focal) (partial) idiopathic epilepsy and epileptic syndromes with seizures of localized onset, not intractable, without status epilepticus: Secondary | ICD-10-CM

## 2022-11-29 NOTE — Discharge Instructions (Signed)

## 2022-12-01 LAB — MAYO MISC ORDER 2

## 2022-12-03 ENCOUNTER — Other Ambulatory Visit: Payer: Self-pay

## 2022-12-03 ENCOUNTER — Other Ambulatory Visit (HOSPITAL_COMMUNITY): Payer: Self-pay

## 2022-12-03 ENCOUNTER — Other Ambulatory Visit: Payer: Self-pay | Admitting: Internal Medicine

## 2022-12-03 MED ORDER — HYDROCODONE-ACETAMINOPHEN 10-325 MG PO TABS
1.0000 | ORAL_TABLET | Freq: Four times a day (QID) | ORAL | 0 refills | Status: DC | PRN
  Filled 2022-12-03 – 2022-12-07 (×2): qty 120, 30d supply, fill #0

## 2022-12-07 ENCOUNTER — Other Ambulatory Visit (HOSPITAL_COMMUNITY): Payer: Self-pay

## 2022-12-08 ENCOUNTER — Other Ambulatory Visit (HOSPITAL_COMMUNITY): Payer: Self-pay

## 2022-12-20 ENCOUNTER — Telehealth: Payer: Self-pay | Admitting: Neurology

## 2022-12-20 MED ORDER — OXCARBAZEPINE 150 MG PO TABS: 150.0000 mg | ORAL_TABLET | Freq: Two times a day (BID) | ORAL | 1 refills | Status: DC

## 2022-12-20 NOTE — Telephone Encounter (Signed)
Pls see how he feels about adding a second seizure medication to the Zonisamide. The medication is oxcarbazepine 150mg : Take 1 tab BID. Side effects (usually on higher doses) may include dizziness, drowsiness, double vision. If he agrees, pls send in Rx for oxcarbazepine 150mg  BID, continue Zonisamide 500mg  qhs. We will put him on waitlist for f/u in April. Thanks

## 2022-12-20 NOTE — Telephone Encounter (Signed)
Pt called to ask seizure questions no answer left a voice mail to call back

## 2022-12-20 NOTE — Telephone Encounter (Signed)
Pt called back in and left a message with the access nurse. He is returning Heather's call

## 2022-12-20 NOTE — Telephone Encounter (Signed)
Pt called he is ok with sending in a 2nd medication The medication is oxcarbazepine 150mg : Take 1 tab BID. Side effects (usually on higher doses) may include dizziness, drowsiness, double vision. If he agrees, pls send in Rx for oxcarbazepine 150mg  BID, continue Zonisamide 500mg  qhs. We will put him on waitlist for f/u in April.

## 2022-12-20 NOTE — Telephone Encounter (Signed)
Pt c/o: seizure Missed medications?  No. Sleep deprived?  No. Has been more tired with increase of zonisamide Alcohol intake?  No. Increased stress? Yes.  Just work, father passed away in 03-Aug-2023 working with fathers stuff has to go to [place] for 2 weeks   Any change in medication color or shape? No. Back to their usual baseline self?  Yes.  . If no, advise go to ER Notice any trigger: no triggers  may have a headache before he gets one  Current medications prescribed by Dr. Delice Lesch: zonisamide 100 mg  Take 4 capsules (400 mg total) by mouth every night  Valoco as needed   More frequent but not lasting as long, takes longer for his memory to come back, they are happening around the same time at night and in the morning recovery in the morning takes longer,   Pt would like an appointment when he returns from [place]

## 2022-12-20 NOTE — Telephone Encounter (Signed)
Pt called in stating he had his lumbar puncture and is still having seizures. He says the seizures are a little different. He is not remembering them now, as he did remember them before. He is about to go out of town and will not be back until April 10. He has not done his 72 hour EEG yet due to complications getting a ride.

## 2022-12-20 NOTE — Telephone Encounter (Signed)
Clarification the zoisamide he said he was taken 400 mg are we going to go up on it as well

## 2022-12-20 NOTE — Telephone Encounter (Signed)
Sorry I misread, continue 400mg  qhs Zonisamide, start oxcarbazepine 150mg  BID. Thanks

## 2022-12-28 LAB — GLUCOSE, CSF: Glucose, CSF: 68 mg/dL (ref 40–80)

## 2022-12-28 LAB — CSF CELL COUNT WITH DIFFERENTIAL
RBC Count, CSF: 0 cells/uL
TOTAL NUCLEATED CELL: 0 cells/uL (ref 0–5)

## 2022-12-28 LAB — CRYPTOCOCCAL AG, LTX SCR RFLX TITER
Cryptococcal Ag Screen: NOT DETECTED
MICRO NUMBER:: 14644371
SPECIMEN QUALITY:: ADEQUATE

## 2022-12-28 LAB — PROTEIN, CSF: Total Protein, CSF: 56 mg/dL — ABNORMAL HIGH (ref 15–45)

## 2022-12-28 LAB — MAYO MISC ORDER: PRICE:: 1740.8

## 2023-01-03 ENCOUNTER — Encounter: Payer: Self-pay | Admitting: Neurology

## 2023-01-03 ENCOUNTER — Other Ambulatory Visit: Payer: Self-pay | Admitting: Internal Medicine

## 2023-01-04 ENCOUNTER — Other Ambulatory Visit: Payer: Self-pay

## 2023-01-04 ENCOUNTER — Other Ambulatory Visit (HOSPITAL_COMMUNITY): Payer: Self-pay

## 2023-01-04 DIAGNOSIS — R251 Tremor, unspecified: Secondary | ICD-10-CM

## 2023-01-04 DIAGNOSIS — G40009 Localization-related (focal) (partial) idiopathic epilepsy and epileptic syndromes with seizures of localized onset, not intractable, without status epilepticus: Secondary | ICD-10-CM

## 2023-01-04 DIAGNOSIS — R9089 Other abnormal findings on diagnostic imaging of central nervous system: Secondary | ICD-10-CM

## 2023-01-04 MED ORDER — OXCARBAZEPINE 150 MG PO TABS
300.0000 mg | ORAL_TABLET | Freq: Two times a day (BID) | ORAL | 6 refills | Status: DC
  Filled 2023-01-04: qty 120, 30d supply, fill #0

## 2023-01-10 ENCOUNTER — Ambulatory Visit: Payer: 59 | Admitting: Internal Medicine

## 2023-01-10 ENCOUNTER — Encounter: Payer: Self-pay | Admitting: Internal Medicine

## 2023-01-10 ENCOUNTER — Other Ambulatory Visit (INDEPENDENT_AMBULATORY_CARE_PROVIDER_SITE_OTHER): Payer: 59

## 2023-01-10 VITALS — BP 94/70 | HR 78 | Temp 98.0°F | Ht 67.0 in | Wt 210.0 lb

## 2023-01-10 DIAGNOSIS — F988 Other specified behavioral and emotional disorders with onset usually occurring in childhood and adolescence: Secondary | ICD-10-CM | POA: Diagnosis not present

## 2023-01-10 DIAGNOSIS — R5382 Chronic fatigue, unspecified: Secondary | ICD-10-CM | POA: Diagnosis not present

## 2023-01-10 DIAGNOSIS — F4323 Adjustment disorder with mixed anxiety and depressed mood: Secondary | ICD-10-CM | POA: Diagnosis not present

## 2023-01-10 DIAGNOSIS — R634 Abnormal weight loss: Secondary | ICD-10-CM

## 2023-01-10 DIAGNOSIS — R251 Tremor, unspecified: Secondary | ICD-10-CM | POA: Diagnosis not present

## 2023-01-10 DIAGNOSIS — G8929 Other chronic pain: Secondary | ICD-10-CM | POA: Diagnosis not present

## 2023-01-10 DIAGNOSIS — G40009 Localization-related (focal) (partial) idiopathic epilepsy and epileptic syndromes with seizures of localized onset, not intractable, without status epilepticus: Secondary | ICD-10-CM

## 2023-01-10 DIAGNOSIS — R9089 Other abnormal findings on diagnostic imaging of central nervous system: Secondary | ICD-10-CM | POA: Diagnosis not present

## 2023-01-10 DIAGNOSIS — M5440 Lumbago with sciatica, unspecified side: Secondary | ICD-10-CM | POA: Diagnosis not present

## 2023-01-10 DIAGNOSIS — G40909 Epilepsy, unspecified, not intractable, without status epilepticus: Secondary | ICD-10-CM

## 2023-01-10 LAB — C-REACTIVE PROTEIN: CRP: <1 mg/dL (ref 0.5–20.0)

## 2023-01-10 LAB — SEDIMENTATION RATE: Sed Rate: 21 mm/hr — ABNORMAL HIGH (ref 0–20)

## 2023-01-10 NOTE — Assessment & Plan Note (Signed)
Chronic Adderall Lions mane  Potential benefits of a long term stimulants  use as well as potential risks  and complications were explained to the patient and were aknowledged. 

## 2023-01-10 NOTE — Assessment & Plan Note (Signed)
F/u w/Neurology - Dr Karel Jarvis - appt today On oxcarbazepine 150mg : take 2 tablets twice a day, Zonisamide 400mg  at bedtime.  72 h EEG pending

## 2023-01-10 NOTE — Assessment & Plan Note (Signed)
Chronic Norco prn  Potential benefits of a long term opioids use as well as potential risks (i.e. addiction risk, apnea etc) and complications (i.e. Somnolence, constipation and others) were explained to the patient and were aknowledged. 

## 2023-01-10 NOTE — Assessment & Plan Note (Signed)
Wt Readings from Last 3 Encounters:  01/10/23 210 lb (95.3 kg)  11/18/22 217 lb (98.4 kg)  11/05/22 220 lb 9.6 oz (100.1 kg)

## 2023-01-10 NOTE — Assessment & Plan Note (Signed)
Working 40 h/wk

## 2023-01-10 NOTE — Progress Notes (Signed)
Subjective:  Patient ID: Mark Mcgee, male    DOB: [birth year]  Age: 57 y.o. MRN: [id number]  CC: Follow-up (2 [facility] F/U)   HPI Mark Mcgee presents for LBP, IBS-d, anxiety, seizures - not better  Outpatient Medications Prior to Visit  Medication Sig Dispense Refill   acetaminophen (TYLENOL) 325 MG tablet Take 325-650 mg by mouth every 6 (six) hours as needed for mild pain or headache.      acyclovir (ZOVIRAX) 400 MG tablet Take 1 tablet (400 mg total) by mouth 4 (four) times daily as needed (as directed for outbreaks). 60 tablet 3   albuterol (VENTOLIN HFA) 108 (90 Base) MCG/ACT inhaler Inhale 2 puffs into the lungs every 4 (four) hours as needed for wheezing or shortness of breath. 18 g 5   alprazolam (XANAX) 2 MG tablet Take 1/2 tablet by mouth 4 times daily as needed for sleep or anxiety. 120 tablet 1   amLODipine-olmesartan (AZOR) 10-40 MG tablet TAKE 1 TABLET BY MOUTH ONCE DAILY 90 tablet 3   amphetamine-dextroamphetamine (ADDERALL) 10 MG tablet Take 1 tablet (10 mg total) by mouth 2 (two) times daily with breakfast and lunch. (Patient taking differently: Take 10 mg by mouth daily.) 60 tablet 0   aspirin EC 81 MG tablet Take 1 tablet (81 mg total) by mouth daily. 100 tablet 3   b complex vitamins tablet Take 1 tablet by mouth daily.     Cholecalciferol (VITAMIN D3) 50 MCG (2000 UT) TABS Take 2,000 Units by mouth daily with breakfast.     diazePAM, 20 MG Dose, (VALTOCO 20 MG DOSE) 2 x 10 MG/0.1ML LQPK Spray in nose as instructed for seizure. May use second dose after 4 hours. 4 each 5   diphenhydrAMINE (BENADRYL) 25 mg capsule Take 25 mg by mouth every 6 (six) hours as needed (for seasonal allergies).     diphenoxylate-atropine (LOMOTIL) 2.5-0.025 MG tablet TAKE 1 OR 2 TABLETS BY MOUTH 4 TIMES DAILY AS NEEDED FOR DIARRHEA OR LOOSE STOOLS MAX OF 8 TABLETS PER DAY (Patient taking differently: Take 1-2 tablets by mouth 4 (four) times daily as needed for diarrhea or loose stools (MAX  OF 8 TABLETS/24 HOURS).) 60 tablet 3   Flaxseed, Linseed, (FLAX SEEDS) POWD 2 (two) times daily as needed (as directed when mixing into health shakes).     Fluticasone-Umeclidin-Vilant (TRELEGY ELLIPTA) 100-62.5-25 MCG/ACT AEPB Inhale 1 puff into the lungs daily. 60 each 11   glycopyrrolate (ROBINUL) 2 MG tablet Take 1 tablet (2 mg total) by mouth 2 (two) times daily. 180 tablet 0   HYDROcodone-acetaminophen (NORCO) 10-325 MG tablet Take 1 tablet by mouth every 6 (six) hours as needed for pain 120 tablet 0   ipratropium (ATROVENT) 0.03 % nasal spray Place 2 sprays into both nostrils 2 (two) times daily. 30 mL 5   loperamide (IMODIUM) 2 MG capsule Take 2 mg by mouth as needed for diarrhea or loose stools.     Multiple Vitamin (MULTIVITAMIN) capsule Take 1 capsule by mouth daily.      zinc gluconate 50 MG tablet Take 100 mg by mouth daily.     zonisamide (ZONEGRAN) 100 MG capsule Take 4 capsules (400 mg total) by mouth every night. 360 capsule 3   OXcarbazepine (TRILEPTAL) 150 MG tablet Take 2 tablets (300 mg total) by mouth 2 (two) times daily. (Patient not taking: Reported on 01/10/2023) 120 tablet 6   testosterone cypionate (DEPOTESTOSTERONE CYPIONATE) 200 MG/ML injection INJECT 1 ML INTO THE MUSCLE EVERY  14 DAYS 10 mL 5   Oyster Shell (OYSTER CALCIUM) 500 MG TABS tablet Take 500 mg of elemental calcium by mouth 2 (two) times daily. (Patient not taking: Reported on 11/05/2022)     tadalafil (CIALIS) 5 MG tablet Take 1 tablet (5 mg total) by mouth daily. (Patient not taking: Reported on 11/05/2022) 90 tablet 3   No facility-administered medications prior to visit.    ROS: Review of Systems  Constitutional:  Positive for fatigue. Negative for appetite change and unexpected weight change.  HENT:  Negative for congestion, nosebleeds, sneezing, sore throat and trouble swallowing.   Eyes:  Negative for itching and visual disturbance.  Respiratory:  Negative for cough.   Cardiovascular:  Negative for  chest pain, palpitations and leg swelling.  Gastrointestinal:  Positive for abdominal pain and diarrhea. Negative for abdominal distention, blood in stool and nausea.  Genitourinary:  Negative for frequency and hematuria.  Musculoskeletal:  Positive for back pain. Negative for gait problem, joint swelling and neck pain.  Skin:  Negative for rash.  Neurological:  Positive for dizziness, weakness and light-headedness. Negative for tremors and speech difficulty.  Psychiatric/Behavioral:  Negative for agitation, dysphoric mood, sleep disturbance and suicidal ideas. The patient is nervous/anxious.     Objective:  BP 94/70 (BP Location: Left Arm, Patient Position: Sitting, Cuff Size: Large)   Pulse 78   Temp 98 F (36.7 C) (Oral)   Ht  (1.702 m)   Wt 210 lb (95.3 kg)   SpO2 97%   BMI 32.89 kg/m   BP Readings from Last 3 Encounters:  01/10/23 94/70  11/29/22 136/89  11/18/22 110/70    Wt Readings from Last 3 Encounters:  01/10/23 210 lb (95.3 kg)  11/18/22 217 lb (98.4 kg)  11/05/22 220 lb 9.6 oz (100.1 kg)    Physical Exam Constitutional:      General: He is not in acute distress.    Appearance: He is well-developed. He is obese.     Comments: NAD  Eyes:     Conjunctiva/sclera: Conjunctivae normal.     Pupils: Pupils are equal, round, and reactive to light.  Neck:     Thyroid: No thyromegaly.     Vascular: No JVD.  Cardiovascular:     Rate and Rhythm: Normal rate and regular rhythm.     Heart sounds: Normal heart sounds. No murmur heard.    No friction rub. No gallop.  Pulmonary:     Effort: Pulmonary effort is normal. No respiratory distress.     Breath sounds: Normal breath sounds. No wheezing or rales.  Chest:     Chest wall: No tenderness.  Abdominal:     General: Bowel sounds are normal. There is no distension.     Palpations: Abdomen is soft. There is no mass.     Tenderness: There is no abdominal tenderness. There is no guarding or rebound.   Musculoskeletal:        General: Tenderness present. Normal range of motion.     Cervical back: Normal range of motion.  Lymphadenopathy:     Cervical: No cervical adenopathy.  Skin:    General: Skin is warm and dry.     Findings: No rash.  Neurological:     Mental Status: He is alert and oriented to person, place, and time.     Cranial Nerves: No cranial nerve deficit.     Motor: No abnormal muscle tone.     Coordination: Coordination normal.     Gait:  Gait normal.     Deep Tendon Reflexes: Reflexes are normal and symmetric.  Psychiatric:        Behavior: Behavior normal.        Thought Content: Thought content normal.        Judgment: Judgment normal.   LS pain w/ROM  Lab Results  Component Value Date   WBC 6.4 03/09/2021   HGB 14.3 03/09/2021   HCT 44.5 03/09/2021   PLT 240.0 03/09/2021   GLUCOSE 95 02/24/2022   CHOL 183 03/09/2021   TRIG 112.0 03/09/2021   HDL 52.20 03/09/2021   LDLDIRECT 91.0 12/04/2019   LDLCALC 108 (H) 03/09/2021   ALT 27 02/24/2022   AST 17 02/24/2022   NA 141 02/24/2022   K 4.3 02/24/2022   CL 103 02/24/2022   CREATININE 1.26 02/24/2022   BUN 17 02/24/2022   CO2 28 02/24/2022   TSH 2.10 09/22/2022   PSA 0.43 02/24/2022    DG FL GUIDED LUMBAR PUNCTURE  Result Date: 11/29/2022 CLINICAL DATA:  Unexplained seizures EXAM: DIAGNOSTIC LUMBAR PUNCTURE UNDER FLUOROSCOPIC GUIDANCE COMPARISON:  None Available. FLUOROSCOPY: Radiation Exposure Index (as provided by the fluoroscopic device): 0 minutes 14 seconds. 24.05 micro gray meter squared PROCEDURE: Informed consent was obtained from the patient prior to the procedure, including potential complications of headache, allergy, and pain. With the patient prone, the lower back was prepped with Betadine. 1% Lidocaine was used for local anesthesia. Lumbar puncture was performed at the right L3-4 level using a 20 gauge needle with return of clear CSF with an opening pressure of 14 cm water. Fourteen ml of CSF  were obtained for laboratory studies. The patient tolerated the procedure well and there were no apparent complications. IMPRESSION: Lumbar puncture on the right at L3-4. Normal opening pressure. 14 cc of clear CSF collected for requested studies. Electronically Signed   By: Paulina Fusi M.D.   On: 11/29/2022 11:24    Assessment & Plan:   Problem List Items Addressed This Visit       Nervous and Auditory   Epilepsy    F/u w/Neurology - Dr Karel Jarvis - appt today On oxcarbazepine 150mg : take 2 tablets twice a day, Zonisamide 400mg  at bedtime.  72 h EEG pending      Relevant Orders   Comprehensive metabolic panel   CBC with Differential/Platelet   Vitamin B12   TSH   VITAMIN D 25 Hydroxy (Vit-D Deficiency, Fractures)   Lipid panel   Urinalysis   PSA     Other   ADD (attention deficit disorder) without hyperactivity    Chronic Adderall Lions mane  Potential benefits of a long term stimulants  use as well as potential risks  and complications were explained to the patient and were aknowledged.      Adjustment disorder with mixed anxiety and depressed mood - Primary    Chronic, severe Xanax prn, Paxil      Relevant Orders   Comprehensive metabolic panel   CBC with Differential/Platelet   Vitamin B12   TSH   VITAMIN D 25 Hydroxy (Vit-D Deficiency, Fractures)   Lipid panel   Urinalysis   PSA   Chronic fatigue    Working 40 h/wk      LOW BACK PAIN    Chronic Norco prn  Potential benefits of a long term opioids use as well as potential risks (i.e. addiction risk, apnea etc) and complications (i.e. Somnolence, constipation and others) were explained to the patient and were aknowledged.  Weight loss    Wt Readings from Last 3 Encounters:  01/10/23 210 lb (95.3 kg)  11/18/22 217 lb (98.4 kg)  11/05/22 220 lb 9.6 oz (100.1 kg)           No orders of the defined types were placed in this encounter.     Follow-up: No follow-ups on file.  Sonda Primes, MD

## 2023-01-10 NOTE — Assessment & Plan Note (Signed)
Chronic, severe Xanax prn, Paxil

## 2023-01-11 LAB — ANA W/REFLEX: Anti Nuclear Antibody (ANA): NEGATIVE

## 2023-01-12 ENCOUNTER — Other Ambulatory Visit: Payer: Self-pay

## 2023-01-12 MED ORDER — AMPHETAMINE-DEXTROAMPHETAMINE 10 MG PO TABS
10.0000 mg | ORAL_TABLET | Freq: Two times a day (BID) | ORAL | 0 refills | Status: DC
  Filled 2023-01-12: qty 60, 30d supply, fill #0

## 2023-01-12 MED ORDER — ALPRAZOLAM 2 MG PO TABS
1.0000 mg | ORAL_TABLET | Freq: Four times a day (QID) | ORAL | 1 refills | Status: DC | PRN
  Filled 2023-01-12: qty 120, 60d supply, fill #0
  Filled 2023-03-16: qty 120, 60d supply, fill #1

## 2023-01-13 ENCOUNTER — Other Ambulatory Visit: Payer: Self-pay | Admitting: Internal Medicine

## 2023-01-14 ENCOUNTER — Other Ambulatory Visit: Payer: Self-pay

## 2023-01-14 ENCOUNTER — Ambulatory Visit (INDEPENDENT_AMBULATORY_CARE_PROVIDER_SITE_OTHER): Payer: 59 | Admitting: Neurology

## 2023-01-14 DIAGNOSIS — G40009 Localization-related (focal) (partial) idiopathic epilepsy and epileptic syndromes with seizures of localized onset, not intractable, without status epilepticus: Secondary | ICD-10-CM | POA: Diagnosis not present

## 2023-01-14 MED ORDER — HYDROCODONE-ACETAMINOPHEN 10-325 MG PO TABS
1.0000 | ORAL_TABLET | Freq: Four times a day (QID) | ORAL | 0 refills | Status: DC | PRN
  Filled 2023-01-14: qty 120, 30d supply, fill #0

## 2023-01-14 NOTE — Progress Notes (Signed)
Ambulatory EEG hooked up and running. Light flashing. Push button tested. Camera and event log explained. Batteries explained. Patient understood.   

## 2023-01-28 ENCOUNTER — Telehealth: Payer: Self-pay

## 2023-01-28 DIAGNOSIS — G40009 Localization-related (focal) (partial) idiopathic epilepsy and epileptic syndromes with seizures of localized onset, not intractable, without status epilepticus: Secondary | ICD-10-CM

## 2023-01-28 NOTE — Telephone Encounter (Signed)
-----   Message from Van Clines, MD sent at 01/21/2023 12:57 PM EDT ----- Regarding: pls call and send EMU referral Can you pls call patient and let him know that the EEG is inconclusive because there is no video to see what is happening when having the seizures. In between, the EEG is normal. At this point, we need to do the inpatient video EEG study to determine where his seizures are coming from. Pls send referral for EMU to [facility] and Dr. Melynda Ripple.   Also, pls confirm how he is taking his medications, if he is on oxcarbazepine 150mg  2 tabs BID (300mg  BID), I would like to increase to 600mg : take 1 tablet BID. Thanks

## 2023-01-28 NOTE — Telephone Encounter (Signed)
Pt called informed of EEG results and that Dr Karel Jarvis wants him to do an EMU to agrees order placed in [facility], pt stated that he winged him self off the oxcarbazepine he hasn't taken it in 2 weeks he said he was tired an had issues on it, he was having a hard time working, Pt stated he is sill taken the zonisamide 400 mg at night,

## 2023-02-01 ENCOUNTER — Other Ambulatory Visit (HOSPITAL_COMMUNITY): Payer: Self-pay

## 2023-02-01 ENCOUNTER — Encounter: Payer: Self-pay | Admitting: Neurology

## 2023-02-01 ENCOUNTER — Ambulatory Visit (INDEPENDENT_AMBULATORY_CARE_PROVIDER_SITE_OTHER): Payer: 59 | Admitting: Neurology

## 2023-02-01 VITALS — BP 103/64 | HR 77 | Ht 67.0 in | Wt 210.8 lb

## 2023-02-01 DIAGNOSIS — R413 Other amnesia: Secondary | ICD-10-CM | POA: Diagnosis not present

## 2023-02-01 DIAGNOSIS — G40009 Localization-related (focal) (partial) idiopathic epilepsy and epileptic syndromes with seizures of localized onset, not intractable, without status epilepticus: Secondary | ICD-10-CM

## 2023-02-01 MED ORDER — LAMOTRIGINE 25 MG PO TABS
ORAL_TABLET | ORAL | 6 refills | Status: DC
  Filled 2023-02-01: qty 120, 39d supply, fill #0

## 2023-02-01 NOTE — Procedures (Signed)
ELECTROENCEPHALOGRAM REPORT  Dates of Recording: 01/14/2023 8:40AM to 01/17/2023 5:33AM   Patient's Name: Mark Mcgee MRN: [id number] Date of Birth: 10/16/1965  Referring Provider: Dr. Patrcia Dolly  Procedure: 69-hour ambulatory EEG  History: This is a 58 year old man with recurrent convulsions and episodes of loss of time. EEG for classification  Medications: Zonisamide, Xanax  Technical Summary: This is a 69-hour multichannel digital EEG recording measured by the international 10-20 system with electrodes applied with paste and impedances below 5000 ohms performed as portable with EKG monitoring.  The digital EEG was referentially recorded, reformatted, and digitally filtered in a variety of bipolar and referential montages for optimal display.    DESCRIPTION OF RECORDING: During maximal wakefulness, the background activity consisted of a symmetric 12 Hz posterior dominant rhythm which was reactive to eye opening.  There were no epileptiform discharges or focal slowing seen in wakefulness.  During the recording, the patient progresses through wakefulness, drowsiness, and Stage 2 sleep.  Again, there were no epileptiform discharges seen.  Events: On 4/19 at 1130 hours, he has a headache. No video recorded. Electrographically, there were no EEG or EKG changes seen.  On 4/20 at 1000 hours, he was laying on couch and was woken up by friend who heard noises and found couch and table in disarray, muscles very sore. There are 2 push buttons at 1037 hours. No video recorded. Electrographically, there were no EEG or EKG changes seen around this time period.  There are 4 push button events on 4/21 at 0940 hours, no symptoms reported on diary. Movement artifact is seen with no epileptiform abnormalities noted. No video recorded.   There are 3 push button events on 4/21 at 2306-2307 hours. No video recorded, no symptoms reported. Electrographically, there were no EEG or EKG changes  seen.   There were no electrographic seizures seen.  EKG lead was unremarkable.  IMPRESSION: This 69-hour ambulatory EEG study is normal.    CLINICAL CORRELATION: A normal EEG does not exclude a clinical diagnosis of epilepsy. Episodes described above did not show any epileptiform correlate, however there is no video available to review semiology. Inpatient video EEG monitoring is recommended.   Patrcia Dolly, M.D.

## 2023-02-01 NOTE — Progress Notes (Signed)
NEUROLOGY FOLLOW UP OFFICE NOTE  Mark Mcgee [id number] August 24, 1966  HISTORY OF PRESENT ILLNESS: I had the pleasure of seeing Mark Mcgee in follow-up in the neurology clinic on 02/01/2023.  The patient was last seen 3 months ago for new onset seizures that started in November 2023. He is alone in the office today. Records and images were personally reviewed where available. He had [name] in 12/2022 which was normal, there was movement artifact but no epileptiform activity seen. He reported his friend reported hearing nosies and couch was messed up, items knocked off table, muscles were very sore. There was no video recorded due to technical issues.  He brings his seizure calendar, he has around 3 seizures a month. He had one on 4/21 (during aEEG), he was laying on the couch with a headache and felt jittery, then was half off the couch, his friend must have pushed the event button (no symptoms noted on diary). His muscles were very sore and he could not move, he was scared. He felt unstable walking. He had another on the couch on 4/29, he saw a bright light then slipped off the couch and woke up on the floor. On 5/3, he got up from his desk and woke up on the floor, it took him 1.5 hours before he could sit back up. The last seizure was on 5/5, he was laying on the couch, tired with a headache, then lost time and was half off the couch feeling stiff in his arms and legs. He misses periods in the day. He feels things are getting worse with his stability and taking longer to recover. He is having a harder time recalling things that he has done repeatedly for years, he takes a longer time completing his work. He tried oxcarbazepine for 2 weeks but could not tolerate side effects of blurred vision, drowsiness. Despite feeling tired all the time, he is not sleeping well. He gets 5 hours of sleep. He has constant tinnitus in both ears, white noise does not help. He has jaw pain and takes prn pain  medication. He usually takes Xanax once a day in the evening. 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 He reports his mother has Alzheimer's disease.    History on Initial Assessment 09/21/2022: This is a pleasant 57 year old right-handed man with a history of hypertension, hyperthyroidism, IBS, anxiety, tremor, presenting for evaluation of new onset seizures. He denies any prior history of seizures until 08/21/22 when he had 3 seizures in one day. He was stressed out after putting his dog to sleep. He recalls driving home and pulling into the driveway but pulled in crooked. His right leg was slamming the break and he could not let go, he knew what was going on but could not do anything. He felt like he was stuck. His friend in the car with him was able to stop the car. He was able to get out of the car and then his friend witnessed a generalized convulsion. He had shaking so bad it dented the side of his car with his arm. His friend lay him on his side, then he woke up and recalls getting up and walking around the car, then he felt like he was being choked then had another seizure. After this, he recalls getting up and going in the house then proceeded to have a third convulsion where he had urinary incontinence then vomited after. When EMS arrived,  he was mildly confused with a hard time finding words. He recalls feeling very jittery, no focal weakness. He was brought to the ER where bloodwork showed a WBC of 15, creatinine 1.33. EKG NSR. Head CT no acute changes. UDS positive for opiates and THC. He is on hydrocodone for chronic jaw pain and using CBD gummies to help with sleep/anxiety. Since then, he has had at least 5 more episodes of loss of consciousness where he wakes up on the floor. Last episode was last night, he went to the mantel to fix something then woke up on the floor. There was one witnessed incident 2 weeks ago where his friend told him he was staring and  unresponsive for 5 minutes. He gets flashes in his eyes before the episodes, then when he wakes up it feels like he went through a workout at the gym. His chest muscles still hurt. Since then, he has also had spasms where what he is holding in his hand goes flying out. He loses his train of thought a lot. Since the initial seizures, he could not recall how to do things at work that he had been doing for over 6 years (works with [employer]). He feels like his brain is scrambled. He has not prior history of headaches but since the seizures has had constant headaches with pressure on both side, 2/10 in intensity today, no nausea/vomiting. He is sensitive to lights. He has had loss of appetite. He has bilateral tinnitus, worse at night. Masking has not helped. HE is not sleeping well, getting 4-6 hours of sleep. Prior to the seizures, he was taking alprazolam around 2 times a week, but since the seizures he has had a constant nervousness and worsening hand tremors and taking 1/4-1/2 tablet daily. His PCP started Levetiracetam 500mg  BID which is making him sleepy. He denies any dizziness, diplopia, neck/back pain, bowel/bladder dysfunction. He was born 3 months premature. 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 He had a normal early development.  There is no history of febrile convulsions, CNS infections such as meningitis/encephalitis, significant traumatic brain injury, neurosurgical procedures, or family history of seizures. His sister also has tremors.  Prior ASMs: Levetiracetam, oxcarbazepine  Diagnostic Data: Brain MRI with and without contrast done 09/2022 no acute changes. There is moderately prominent enlargement of the atria and occipital horns of the right greater than left lateral ventricles with associated surrounding white matter volume loss. One or two small foci of T2 FLAIR hyperintensity in the cerebral white matter are within normal limits for age.   Routine EEG 08/2022  normal  69-hour Ambulatory EEG 12/2022 normal. Push button events showed movement artifact, no epileptiform discharges seen. No video recorded.   Lumbar puncture 11/2022: mildly elevated CSF protein 56, normal cell count, glucose. Negative autoimmune panel.    PAST MEDICAL HISTORY: Past Medical History:  Diagnosis Date   Anxiety    Costochondritis    recurrent    Depression    Dr Dub Mikes   Fatigue 2009   Genital herpes 2009   GERD (gastroesophageal reflux disease)    Heat stroke    HTN (hypertension)    Hyperthyroidism    IBS (irritable bowel syndrome)    Panic attack    Prostatitis    Very painful flare-ups Dr Retta Diones   Tremor     MEDICATIONS: Current Outpatient Medications on File Prior to Visit  Medication Sig Dispense Refill   acetaminophen (TYLENOL) 325 MG tablet  Take 325-650 mg by mouth every 6 (six) hours as needed for mild pain or headache.      acyclovir (ZOVIRAX) 400 MG tablet Take 1 tablet (400 mg total) by mouth 4 (four) times daily as needed (as directed for outbreaks). 60 tablet 3   albuterol (VENTOLIN HFA) 108 (90 Base) MCG/ACT inhaler Inhale 2 puffs into the lungs every 4 (four) hours as needed for wheezing or shortness of breath. 18 g 5   alprazolam (XANAX) 2 MG tablet Take 1/2 tablet by mouth 4 times daily as needed for sleep or anxiety. 120 tablet 1   amLODipine-olmesartan (AZOR) 10-40 MG tablet TAKE 1 TABLET BY MOUTH ONCE DAILY 90 tablet 3   amphetamine-dextroamphetamine (ADDERALL) 10 MG tablet Take 1 tablet (10 mg total) by mouth 2 (two) times daily with breakfast and lunch. 60 tablet 0   aspirin EC 81 MG tablet Take 1 tablet (81 mg total) by mouth daily. 100 tablet 3   b complex vitamins tablet Take 1 tablet by mouth daily.     Cholecalciferol (VITAMIN D3) 50 MCG (2000 UT) TABS Take 2,000 Units by mouth daily with breakfast.     diazePAM, 20 MG Dose, (VALTOCO 20 MG DOSE) 2 x 10 MG/0.1ML LQPK Spray in nose as instructed for seizure. May use second dose after  4 hours. 4 each 5   diphenhydrAMINE (BENADRYL) 25 mg capsule Take 25 mg by mouth every 6 (six) hours as needed (for seasonal allergies).     diphenoxylate-atropine (LOMOTIL) 2.5-0.025 MG tablet TAKE 1 OR 2 TABLETS BY MOUTH 4 TIMES DAILY AS NEEDED FOR DIARRHEA OR LOOSE STOOLS MAX OF 8 TABLETS PER DAY (Patient taking differently: Take 1-2 tablets by mouth 4 (four) times daily as needed for diarrhea or loose stools (MAX OF 8 TABLETS/24 HOURS).) 60 tablet 3   Flaxseed, Linseed, (FLAX SEEDS) POWD 2 (two) times daily as needed (as directed when mixing into health shakes).     Fluticasone-Umeclidin-Vilant (TRELEGY ELLIPTA) 100-62.5-25 MCG/ACT AEPB Inhale 1 puff into the lungs daily. 60 each 11   glycopyrrolate (ROBINUL) 2 MG tablet Take 1 tablet (2 mg total) by mouth 2 (two) times daily. 180 tablet 0   HYDROcodone-acetaminophen (NORCO) 10-325 MG tablet Take 1 tablet by mouth every 6 (six) hours as needed for pain 120 tablet 0   loperamide (IMODIUM) 2 MG capsule Take 2 mg by mouth as needed for diarrhea or loose stools.     Multiple Vitamin (MULTIVITAMIN) capsule Take 1 capsule by mouth daily.      testosterone cypionate (DEPOTESTOSTERONE CYPIONATE) 200 MG/ML injection INJECT 1 ML INTO THE MUSCLE EVERY 14 DAYS 10 mL 5   zinc gluconate 50 MG tablet Take 100 mg by mouth daily.     zonisamide (ZONEGRAN) 100 MG capsule Take 4 capsules (400 mg total) by mouth every night. 360 capsule 3   No current facility-administered medications on file prior to visit.    ALLERGIES: Allergies  Allergen Reactions   Buspirone Hcl Other (See Comments)    Reaction not recalled by the patient   Divalproex Sodium Other (See Comments)    Severe headaches   Gluten Meal Other (See Comments)   Olanzapine-Fluoxetine Hcl Other (See Comments)    Reaction not recalled by the patient   Other Other (See Comments)   Piroxicam Other (See Comments)    Reaction not recalled by the patient   Wheat Diarrhea and Other (See Comments)     "Tears up my stomach"   Ibuprofen Rash and  Other (See Comments)    Spots appear on chest appear    FAMILY HISTORY: Family History  Problem Relation Age of Onset   Hyperlipidemia Other    Coronary artery disease Other    Stroke Mother    Colon cancer Paternal Grandmother    Diabetes Other    Breast cancer Other    Pancreatic cancer Other    Cancer Other        breast, pancreatic   Heart disease Other    Cancer Maternal Grandmother        colon    SOCIAL HISTORY: Social History   Socioeconomic History   Marital status: Single    Spouse name: Not on file   Number of children: Not on file   Years of education: Not on file   Highest education level: Not on file  Occupational History   Occupation: Teacher, adult education: [employer]  Tobacco Use   Smoking status: Never   Smokeless tobacco: Never  Vaping Use   Vaping Use: Never used  Substance and Sexual Activity   Alcohol use: Not Currently    Comment: 2-6 beers every 2/wks   Drug use: No   Sexual activity: Yes  Other Topics Concern   Not on file  Social History Narrative   Are you right handed or left handed? right   Are you currently employed ? 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 no   Who lives with you? Room mate   What type of home do you live in: 1 story or 2 story? 1.5 story        Social Determinants of Health   Financial Resource Strain: Not on file  Food Insecurity: Not on file  Transportation Needs: Not on file  Physical Activity: Not on file  Stress: Not on file  Social Connections: Not on file  Intimate Partner Violence: Not on file     PHYSICAL EXAM: Vitals:   02/01/23 0813  BP: 103/64  Pulse: 77  SpO2: 97%   General: No acute distress, depressed mood Head:  Normocephalic/atraumatic Skin/Extremities: No rash, no edema Neurological Exam: alert and awake. No aphasia or dysarthria. Fund of knowledge is appropriate.Attention and  concentration are normal.   Cranial nerves: Pupils equal, round. Extraocular movements intact with no nystagmus. Visual fields full.  No facial asymmetry.  Motor: Bulk and tone normal, muscle strength 5/5 throughout with no pronator drift.   Finger to nose testing intact.  Gait narrow-based and steady, no ataxia. There is a bilateral high amplitude low frequency postural tremor R>L. No significant action tremor.   IMPRESSION: This is a pleasant 57 yo RH man with a history of hypertension, hyperthyroidism, IBS, anxiety, tremor, with new onset seizures since 08/21/2022. He has nocturnal convulsions and staring episodes. MRI brain no acute changes however there is note of moderate prominent enlargement of the atria and occipital horns of the right greater than left lateral ventricles, hippocampi symmetric. His recent ambulatory EEG captured seizures however there was no video recorded to view semiology, no epileptiform discharges were seen. We discussed different causes of seizures, he needs inpatient video EEG monitoring for characterization as he continues to have 3 seizures a month. We discussed starting Lamotrigine, side effects including Levonne Spiller syndrome were discussed. Start Lamotrigine 25mg  qhs x 1 week, then increase to 25mg  BID x 1 week, then 50mg  BID. Continue Zonisamide 400mg  qhs. He is  reporting more cognitive changes, Neuropsychological evaluation will be ordered. He is not driving. Follow-up after tests, call for any changes.    Thank you for allowing me to participate in his care.  Please do not hesitate to call for any questions or concerns.    Patrcia Dolly, M.D.   CC: Dr. Posey Rea

## 2023-02-01 NOTE — Patient Instructions (Addendum)
Good to see you.  Start Lamotrigine 25mg : Take 1 tablet every night for 1 week, then increase to 1 tablet twice a day for 1 week, then increase to 2 tablets twice a day  2. Continue Zonisamide 100mg : Take 4 capsules every night  3. I will see how we can get the inpatient video EEG admission expedited  4. Schedule Neurocognitive testing  5. Follow-up after testing   Seizure Precautions: 1. If medication has been prescribed for you to prevent seizures, take it exactly as directed.  Do not stop taking the medicine without talking to your doctor first, even if you have not had a seizure in a long time.   2. Avoid activities in which a seizure would cause danger to yourself or to others.  Don't operate dangerous machinery, swim alone, or climb in high or dangerous places, such as on ladders, roofs, or girders.  Do not drive unless your doctor says you may.  3. If you have any warning that you may have a seizure, lay down in a safe place where you can't hurt yourself.    4.  No driving for 6 months from last seizure, as per [facility].   Please refer to the following link on the [employer]'s website for more information: http://www.epilepsyfoundation.org/answerplace/Social/driving/drivingu.cfm   5.  Maintain good sleep hygiene. Avoid alcohol.  6.  Contact your doctor if you have any problems that may be related to the medicine you are taking.  7.  Call 911 and bring the patient back to the ED if:        A.  The seizure lasts longer than 5 minutes.       B.  The patient doesn't awaken shortly after the seizure  C.  The patient has new problems such as difficulty seeing, speaking or moving  D.  The patient was injured during the seizure  E.  The patient has a temperature over 102 F (39C)  F.  The patient vomited and now is having trouble breathing

## 2023-02-10 ENCOUNTER — Other Ambulatory Visit: Payer: Self-pay | Admitting: Internal Medicine

## 2023-02-11 ENCOUNTER — Other Ambulatory Visit: Payer: Self-pay

## 2023-02-11 MED ORDER — AMPHETAMINE-DEXTROAMPHETAMINE 10 MG PO TABS
10.0000 mg | ORAL_TABLET | Freq: Two times a day (BID) | ORAL | 0 refills | Status: DC
  Filled 2023-02-11: qty 60, 30d supply, fill #0

## 2023-02-11 MED ORDER — HYDROCODONE-ACETAMINOPHEN 10-325 MG PO TABS
1.0000 | ORAL_TABLET | Freq: Four times a day (QID) | ORAL | 0 refills | Status: DC | PRN
  Filled 2023-02-11: qty 120, 30d supply, fill #0

## 2023-02-11 MED ORDER — TESTOSTERONE CYPIONATE 200 MG/ML IM SOLN
200.0000 mg | INTRAMUSCULAR | 5 refills | Status: DC
  Filled 2023-02-11: qty 2, 28d supply, fill #0
  Filled 2023-05-11: qty 2, 28d supply, fill #1
  Filled 2023-07-18: qty 2, 28d supply, fill #2

## 2023-02-14 ENCOUNTER — Other Ambulatory Visit: Payer: Self-pay

## 2023-02-15 ENCOUNTER — Encounter: Payer: Self-pay | Admitting: Internal Medicine

## 2023-02-16 ENCOUNTER — Other Ambulatory Visit: Payer: Self-pay | Admitting: Internal Medicine

## 2023-02-16 DIAGNOSIS — E291 Testicular hypofunction: Secondary | ICD-10-CM

## 2023-02-16 DIAGNOSIS — Z113 Encounter for screening for infections with a predominantly sexual mode of transmission: Secondary | ICD-10-CM

## 2023-03-01 ENCOUNTER — Other Ambulatory Visit (INDEPENDENT_AMBULATORY_CARE_PROVIDER_SITE_OTHER): Payer: Managed Care, Other (non HMO)

## 2023-03-01 DIAGNOSIS — F4323 Adjustment disorder with mixed anxiety and depressed mood: Secondary | ICD-10-CM

## 2023-03-01 DIAGNOSIS — G40909 Epilepsy, unspecified, not intractable, without status epilepticus: Secondary | ICD-10-CM | POA: Diagnosis not present

## 2023-03-01 DIAGNOSIS — E291 Testicular hypofunction: Secondary | ICD-10-CM | POA: Diagnosis not present

## 2023-03-01 DIAGNOSIS — Z113 Encounter for screening for infections with a predominantly sexual mode of transmission: Secondary | ICD-10-CM

## 2023-03-01 LAB — COMPREHENSIVE METABOLIC PANEL
ALT: 14 U/L (ref 0–53)
AST: 13 U/L (ref 0–37)
Albumin: 4.9 g/dL (ref 3.5–5.2)
Alkaline Phosphatase: 52 U/L (ref 39–117)
BUN: 23 mg/dL (ref 6–23)
CO2: 25 mEq/L (ref 19–32)
Calcium: 9.9 mg/dL (ref 8.4–10.5)
Chloride: 105 mEq/L (ref 96–112)
Creatinine, Ser: 1.38 mg/dL (ref 0.40–1.50)
GFR: 57.41 mL/min — ABNORMAL LOW (ref >60.00)
Glucose, Bld: 100 mg/dL — ABNORMAL HIGH (ref 70–99)
Potassium: 4.9 mEq/L (ref 3.5–5.1)
Sodium: 140 mEq/L (ref 135–145)
Total Bilirubin: 0.5 mg/dL (ref 0.2–1.2)
Total Protein: 7.1 g/dL (ref 6.0–8.3)

## 2023-03-01 LAB — CBC WITH DIFFERENTIAL/PLATELET
Basophils Absolute: 0 10*3/uL (ref 0.0–0.1)
Basophils Relative: 0.6 % (ref 0.0–3.0)
Eosinophils Absolute: 0.2 10*3/uL (ref 0.0–0.7)
Eosinophils Relative: 2.6 % (ref 0.0–5.0)
HCT: 39.5 % (ref 39.0–52.0)
Hemoglobin: 12.4 g/dL — ABNORMAL LOW (ref 13.0–17.0)
Lymphocytes Relative: 24.9 % (ref 12.0–46.0)
Lymphs Abs: 1.5 10*3/uL (ref 0.7–4.0)
MCHC: 31.5 g/dL (ref 30.0–36.0)
MCV: 78.9 fl (ref 78.0–100.0)
Monocytes Absolute: 0.4 10*3/uL (ref 0.1–1.0)
Monocytes Relative: 6.9 % (ref 3.0–12.0)
Neutro Abs: 3.8 10*3/uL (ref 1.4–7.7)
Neutrophils Relative %: 65 % (ref 43.0–77.0)
Platelets: 269 10*3/uL (ref 150.0–400.0)
RBC: 5.01 Mil/uL (ref 4.22–5.81)
RDW: 15.5 % (ref 11.5–15.5)
WBC: 5.9 10*3/uL (ref 4.0–10.5)

## 2023-03-01 LAB — URINALYSIS
Bilirubin Urine: NEGATIVE
Hgb urine dipstick: NEGATIVE
Ketones, ur: NEGATIVE
Leukocytes,Ua: NEGATIVE
Nitrite: NEGATIVE
Specific Gravity, Urine: <=1.005 — AB (ref 1.000–1.030)
Total Protein, Urine: NEGATIVE
Urine Glucose: NEGATIVE
Urobilinogen, UA: 0.2 (ref 0.0–1.0)
pH: 6 (ref 5.0–8.0)

## 2023-03-01 LAB — TESTOSTERONE: Testosterone: 243.81 ng/dL — ABNORMAL LOW (ref 300.00–890.00)

## 2023-03-01 LAB — LIPID PANEL
Cholesterol: 172 mg/dL (ref 0–200)
HDL: 46.8 mg/dL (ref >39.00)
LDL Cholesterol: 99 mg/dL (ref 0–99)
NonHDL: 124.79
Total CHOL/HDL Ratio: 4
Triglycerides: 127 mg/dL (ref 0.0–149.0)
VLDL: 25.4 mg/dL (ref 0.0–40.0)

## 2023-03-01 LAB — VITAMIN B12: Vitamin B-12: 169 pg/mL — ABNORMAL LOW (ref 211–911)

## 2023-03-01 LAB — VITAMIN D 25 HYDROXY (VIT D DEFICIENCY, FRACTURES): VITD: 20.31 ng/mL — ABNORMAL LOW (ref 30.00–100.00)

## 2023-03-01 LAB — PSA: PSA: 0.65 ng/mL (ref 0.10–4.00)

## 2023-03-01 LAB — TSH: TSH: 1.78 u[IU]/mL (ref 0.35–5.50)

## 2023-03-02 LAB — HIV ANTIBODY (ROUTINE TESTING W REFLEX): HIV 1&2 Ab, 4th Generation: NONREACTIVE

## 2023-03-16 ENCOUNTER — Other Ambulatory Visit: Payer: Self-pay

## 2023-03-16 ENCOUNTER — Other Ambulatory Visit (HOSPITAL_COMMUNITY): Payer: Self-pay

## 2023-03-16 ENCOUNTER — Other Ambulatory Visit: Payer: Self-pay | Admitting: Internal Medicine

## 2023-03-17 ENCOUNTER — Other Ambulatory Visit (HOSPITAL_COMMUNITY): Payer: Self-pay

## 2023-03-18 ENCOUNTER — Other Ambulatory Visit (HOSPITAL_COMMUNITY): Payer: Self-pay

## 2023-03-18 ENCOUNTER — Telehealth: Payer: Self-pay | Admitting: Internal Medicine

## 2023-03-18 NOTE — Telephone Encounter (Signed)
FMLA paperwork received and placed in provider's box up front.

## 2023-03-21 ENCOUNTER — Ambulatory Visit (INDEPENDENT_AMBULATORY_CARE_PROVIDER_SITE_OTHER): Payer: Managed Care, Other (non HMO) | Admitting: Internal Medicine

## 2023-03-21 ENCOUNTER — Other Ambulatory Visit: Payer: Self-pay

## 2023-03-21 ENCOUNTER — Encounter: Payer: Self-pay | Admitting: Internal Medicine

## 2023-03-21 ENCOUNTER — Other Ambulatory Visit (HOSPITAL_COMMUNITY): Payer: Self-pay

## 2023-03-21 VITALS — BP 110/70 | HR 75 | Temp 98.6°F | Ht 67.0 in | Wt 209.0 lb

## 2023-03-21 DIAGNOSIS — F418 Other specified anxiety disorders: Secondary | ICD-10-CM | POA: Diagnosis not present

## 2023-03-21 DIAGNOSIS — I2583 Coronary atherosclerosis due to lipid rich plaque: Secondary | ICD-10-CM

## 2023-03-21 DIAGNOSIS — G40909 Epilepsy, unspecified, not intractable, without status epilepticus: Secondary | ICD-10-CM

## 2023-03-21 DIAGNOSIS — I10 Essential (primary) hypertension: Secondary | ICD-10-CM | POA: Diagnosis not present

## 2023-03-21 DIAGNOSIS — E559 Vitamin D deficiency, unspecified: Secondary | ICD-10-CM

## 2023-03-21 DIAGNOSIS — M5440 Lumbago with sciatica, unspecified side: Secondary | ICD-10-CM

## 2023-03-21 DIAGNOSIS — J4521 Mild intermittent asthma with (acute) exacerbation: Secondary | ICD-10-CM

## 2023-03-21 DIAGNOSIS — F988 Other specified behavioral and emotional disorders with onset usually occurring in childhood and adolescence: Secondary | ICD-10-CM

## 2023-03-21 DIAGNOSIS — E538 Deficiency of other specified B group vitamins: Secondary | ICD-10-CM | POA: Diagnosis not present

## 2023-03-21 DIAGNOSIS — G8929 Other chronic pain: Secondary | ICD-10-CM

## 2023-03-21 DIAGNOSIS — I2510 Atherosclerotic heart disease of native coronary artery without angina pectoris: Secondary | ICD-10-CM

## 2023-03-21 MED ORDER — VITAMIN D (ERGOCALCIFEROL) 1.25 MG (50000 UNIT) PO CAPS
50000.0000 [IU] | ORAL_CAPSULE | ORAL | 0 refills | Status: DC
  Filled 2023-03-21: qty 8, 56d supply, fill #0

## 2023-03-21 MED ORDER — VITAMIN D3 50 MCG (2000 UT) PO CAPS
2000.0000 [IU] | ORAL_CAPSULE | Freq: Every day | ORAL | 3 refills | Status: DC
  Filled 2023-03-21: qty 100, 100d supply, fill #0

## 2023-03-21 MED ORDER — CYANOCOBALAMIN 1000 MCG/ML IJ SOLN
INTRAMUSCULAR | 6 refills | Status: DC
Start: 2023-03-21 — End: 2023-03-21

## 2023-03-21 MED ORDER — HYDROCODONE-ACETAMINOPHEN 10-325 MG PO TABS
1.0000 | ORAL_TABLET | Freq: Four times a day (QID) | ORAL | 0 refills | Status: DC | PRN
  Filled 2023-03-21: qty 120, 30d supply, fill #0

## 2023-03-21 MED ORDER — CYANOCOBALAMIN 1000 MCG/ML IJ SOLN
1000.0000 ug | Freq: Once | INTRAMUSCULAR | Status: AC
Start: 2023-03-21 — End: 2023-03-21
  Administered 2023-03-21: 1000 ug via INTRAMUSCULAR

## 2023-03-21 MED ORDER — "BD ECLIPSE SYRINGE 25G X 1"" 3 ML MISC"
3 refills | Status: DC
  Filled 2023-03-21: qty 15, 90d supply, fill #0
  Filled 2023-04-18 – 2023-06-07 (×2): qty 15, 90d supply, fill #1
  Filled 2023-07-18 – 2023-09-02 (×3): qty 15, 90d supply, fill #2

## 2023-03-21 MED ORDER — CYANOCOBALAMIN 1000 MCG/ML IJ SOLN
1000.0000 ug | INTRAMUSCULAR | 6 refills | Status: DC
  Filled 2023-03-21: qty 9, 28d supply, fill #0
  Filled 2023-04-18: qty 6, 28d supply, fill #1
  Filled 2023-06-07: qty 10, 28d supply, fill #2
  Filled 2023-07-18: qty 10, 60d supply, fill #3
  Filled 2023-12-05: qty 10, 60d supply, fill #4

## 2023-03-21 NOTE — Addendum Note (Signed)
Addended by: Delsa Grana R on: 03/21/2023 12:02 PM   Modules accepted: Orders

## 2023-03-21 NOTE — Assessment & Plan Note (Signed)
Coronary calcium CT score is 142.

## 2023-03-21 NOTE — Assessment & Plan Note (Signed)
Chronic Adderall Lions mane  Potential benefits of a long term stimulants  use as well as potential risks  and complications were explained to the patient and were aknowledged. 

## 2023-03-21 NOTE — Assessment & Plan Note (Signed)
Ventolin HFA, Medrol pack, Zpac, Tessalon 

## 2023-03-21 NOTE — Assessment & Plan Note (Signed)
Norco prn  Potential benefits of a long term opioids use as well as potential risks (i.e. addiction risk, apnea etc) and complications (i.e. Somnolence, constipation and others) were explained to the patient and were aknowledged.  Hot yoga Body pillow GERD wedge McKenzie pillow Rice bag TRE (trauma release exercise)

## 2023-03-21 NOTE — Assessment & Plan Note (Signed)
start Vit D prescription 50000 iu weekly  followed by over-the-counter Vit D 2000 iu daily.

## 2023-03-21 NOTE — Progress Notes (Signed)
Subjective:  Patient ID: Mark Mcgee, male    DOB: [birth year]  Age: 57 y.o. MRN: [id number]  CC: Annual Exam   HPI Mark Mcgee presents for stress, depression, HTN, LBP, chronic diarrhea. 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      acyclovir (ZOVIRAX) 400 MG tablet Take 1 tablet (400 mg total) by mouth 4 (four) times daily as needed (as directed for outbreaks). 60 tablet 3   albuterol (VENTOLIN HFA) 108 (90 Base) MCG/ACT inhaler Inhale 2 puffs into the lungs every 4 (four) hours as needed for wheezing or shortness of breath. 18 g 5   alprazolam (XANAX) 2 MG tablet Take 1/2 tablet by mouth 4 times daily as needed for sleep or anxiety. 120 tablet 1   amLODipine-olmesartan (AZOR) 10-40 MG tablet TAKE 1 TABLET BY MOUTH ONCE DAILY 90 tablet 3   amphetamine-dextroamphetamine (ADDERALL) 10 MG tablet Take 1 tablet (10 mg total) by mouth 2 (two) times daily with breakfast and lunch. 60 tablet 0   aspirin EC 81 MG tablet Take 1 tablet (81 mg total) by mouth daily. 100 tablet 3   b complex vitamins tablet Take 1 tablet by mouth daily.     Cholecalciferol (VITAMIN D3) 50 MCG (2000 UT) TABS Take 2,000 Units by mouth daily with breakfast.     diazePAM, 20 MG Dose, (VALTOCO 20 MG DOSE) 2 x 10 MG/0.1ML LQPK Spray in nose as instructed for seizure. May use second dose after 4 hours. 4 each 5   diphenhydrAMINE (BENADRYL) 25 mg capsule Take 25 mg by mouth every 6 (six) hours as needed (for seasonal allergies).     diphenoxylate-atropine (LOMOTIL) 2.5-0.025 MG tablet TAKE 1 OR 2 TABLETS BY MOUTH 4 TIMES DAILY AS NEEDED FOR DIARRHEA OR LOOSE STOOLS MAX OF 8 TABLETS PER DAY (Patient taking differently: Take 1-2 tablets by mouth 4 (four) times daily as needed for diarrhea or loose stools (MAX OF 8  TABLETS/24 HOURS).) 60 tablet 3   Flaxseed, Linseed, (FLAX SEEDS) POWD 2 (two) times daily as needed (as directed when mixing into health shakes).     Fluticasone-Umeclidin-Vilant (TRELEGY ELLIPTA) 100-62.5-25 MCG/ACT AEPB Inhale 1 puff into the lungs daily. 60 each 11   glycopyrrolate (ROBINUL) 2 MG tablet Take 1 tablet (2 mg total) by mouth 2 (two) times daily. 180 tablet 0   HYDROcodone-acetaminophen (NORCO) 10-325 MG tablet Take 1 tablet by mouth every 6 (six) hours as needed for pain 120 tablet 0   lamoTRIgine (LAMICTAL) 25 MG tablet Take 1 tablet (25 mg total) by mouth every evening for 7 days, THEN 1 tablet (25 mg total) 2 (two) times daily for 7 days, THEN 2 tablets (50 mg total) 2 (two) times daily. 120 tablet 6   loperamide (IMODIUM) 2 MG capsule Take 2 mg by mouth as needed for diarrhea or loose stools.     Multiple Vitamin (MULTIVITAMIN) capsule Take 1 capsule by mouth daily.      testosterone cypionate (DEPOTESTOSTERONE CYPIONATE) 200 MG/ML injection Inject 1 mL (200 mg total) into the muscle every 14 (fourteen) days. 10 mL 5   zinc gluconate 50 MG tablet Take 100 mg by mouth daily.     zonisamide (ZONEGRAN) 100 MG capsule Take 4 capsules (400 mg total) by mouth  every night. 360 capsule 3   No facility-administered medications prior to visit.    ROS: Review of Systems  Constitutional:  Positive for fatigue. Negative for appetite change and unexpected weight change.  HENT:  Negative for congestion, nosebleeds, sneezing, sore throat and trouble swallowing.   Eyes:  Negative for itching and visual disturbance.  Respiratory:  Negative for cough.   Cardiovascular:  Negative for chest pain, palpitations and leg swelling.  Gastrointestinal:  Positive for diarrhea. Negative for abdominal distention, blood in stool and nausea.  Genitourinary:  Positive for frequency. Negative for hematuria.  Musculoskeletal:  Negative for back pain, gait problem, joint swelling and neck pain.  Skin:   Negative for rash.  Neurological:  Positive for weakness. Negative for dizziness, tremors and speech difficulty.  Psychiatric/Behavioral:  Positive for decreased concentration, dysphoric mood and sleep disturbance. Negative for agitation and suicidal ideas. The patient is nervous/anxious.     Objective:  BP 110/70 (BP Location: Left Arm, Patient Position: Sitting, Cuff Size: Large)   Pulse 75   Temp 98.6 F (37 C) (Oral)   Ht 5\' 7"  (1.702 m)   Wt 209 lb (94.8 kg)   SpO2 98%   BMI 32.73 kg/m   BP Readings from Last 3 Encounters:  03/21/23 110/70  02/01/23 103/64  01/10/23 94/70    Wt Readings from Last 3 Encounters:  03/21/23 209 lb (94.8 kg)  02/01/23 210 lb 12.8 oz (95.6 kg)  01/10/23 210 lb (95.3 kg)    Physical Exam Constitutional:      General: He is not in acute distress.    Appearance: He is well-developed.     Comments: NAD  Eyes:     Conjunctiva/sclera: Conjunctivae normal.     Pupils: Pupils are equal, round, and reactive to light.  Neck:     Thyroid: No thyromegaly.     Vascular: No JVD.  Cardiovascular:     Rate and Rhythm: Normal rate and regular rhythm.     Heart sounds: Normal heart sounds. No murmur heard.    No friction rub. No gallop.  Pulmonary:     Effort: Pulmonary effort is normal. No respiratory distress.     Breath sounds: Normal breath sounds. No wheezing or rales.  Chest:     Chest wall: No tenderness.  Abdominal:     General: Bowel sounds are normal. There is no distension.     Palpations: Abdomen is soft. There is no mass.     Tenderness: There is no abdominal tenderness. There is no guarding or rebound.  Musculoskeletal:        General: No tenderness. Normal range of motion.     Cervical back: Normal range of motion.     Right lower leg: No edema.     Left lower leg: No edema.  Lymphadenopathy:     Cervical: No cervical adenopathy.  Skin:    General: Skin is warm and dry.     Findings: No rash.  Neurological:     Mental  Status: He is alert and oriented to person, place, and time.     Cranial Nerves: No cranial nerve deficit.     Motor: No abnormal muscle tone.     Coordination: Coordination normal.     Gait: Gait normal.     Deep Tendon Reflexes: Reflexes are normal and symmetric.  Psychiatric:        Behavior: Behavior normal.        Thought Content: Thought content normal.  Judgment: Judgment normal.      A total time of 45 minutes was spent preparing to see the patient, reviewing tests, x-rays, operative reports and other medical records.  Also, obtaining history and performing comprehensive physical exam.  Additionally, counseling the patient regarding the above listed issues B12 and vid D def, stress at work, [employer].   Finally, documenting clinical information in the health records, coordination of care, educating the patient. It is a complex case.  Lab Results  Component Value Date   WBC 5.9 03/01/2023   HGB 12.4 (L) 03/01/2023   HCT 39.5 03/01/2023   PLT 269.0 03/01/2023   GLUCOSE 100 (H) 03/01/2023   CHOL 172 03/01/2023   TRIG 127.0 03/01/2023   HDL 46.80 03/01/2023   LDLDIRECT 91.0 12/04/2019   LDLCALC 99 03/01/2023   ALT 14 03/01/2023   AST 13 03/01/2023   NA 140 03/01/2023   K 4.9 03/01/2023   CL 105 03/01/2023   CREATININE 1.38 03/01/2023   BUN 23 03/01/2023   CO2 25 03/01/2023   TSH 1.78 03/01/2023   PSA 0.65 03/01/2023    DG FL GUIDED LUMBAR PUNCTURE  Result Date: 11/29/2022 CLINICAL DATA:  Unexplained seizures EXAM: DIAGNOSTIC LUMBAR PUNCTURE UNDER FLUOROSCOPIC GUIDANCE COMPARISON:  None Available. FLUOROSCOPY: Radiation Exposure Index (as provided by the fluoroscopic device): 0 minutes 14 seconds. 24.05 micro gray meter squared PROCEDURE: Informed consent was obtained from the patient prior to the procedure, including potential complications of headache, allergy, and pain. With the patient prone, the lower back was prepped with Betadine. 1% Lidocaine was used for local  anesthesia. Lumbar puncture was performed at the right L3-4 level using a 20 gauge needle with return of clear CSF with an opening pressure of 14 cm water. Fourteen ml of CSF were obtained for laboratory studies. The patient tolerated the procedure well and there were no apparent complications. IMPRESSION: Lumbar puncture on the right at L3-4. Normal opening pressure. 14 cc of clear CSF collected for requested studies. Electronically Signed   By: Paulina Fusi M.D.   On: 11/29/2022 11:24    Assessment & Plan:   Problem List Items Addressed This Visit     Anxiety disorder    Chronic  Xanax prn.  Use as little as possible  Potential benefits of a long term benzodiazepines  use as well as potential risks  and complications were explained to the patient and were aknowledged.      HYPERTENSION    Chronic On Azor      Asthmatic bronchitis    Ventolin HFA, Medrol pack, Zpac, Tessalon      LOW BACK PAIN - Primary    Norco prn  Potential benefits of a long term opioids use as well as potential risks (i.e. addiction risk, apnea etc) and complications (i.e. Somnolence, constipation and others) were explained to the patient and were aknowledged.  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      Epilepsy (HCC)   Relevant Orders   Comprehensive metabolic panel   Coronary atherosclerosis    Coronary calcium CT score is 142.      B12 deficiency    Start on SQ B12      Relevant  Orders   Vitamin B12   Vitamin D deficiency    start Vit D prescription 50000 iu weekly  followed by over-the-counter Vit D 2000 iu daily.       Relevant Orders   VITAMIN D 25 Hydroxy (Vit-D Deficiency, Fractures)       Meds ordered this encounter  Medications   DISCONTD: cyanocobalamin (VITAMIN B12) 1000 MCG/ML injection    Sig: Give 1000 mcg of Vitamin b12 sq daily x 1 week, then weekly for 1 month, then once every 2 weeks    Dispense:  10 mL    Refill:  6   Cholecalciferol (VITAMIN D3) 50 MCG (2000 UT) capsule    Sig: Take 1 capsule (2,000 Units total) by mouth daily.    Dispense:  100 capsule    Refill:  3   Vitamin D, Ergocalciferol, (DRISDOL) 1.25 MG (50000 UNIT) CAPS capsule    Sig: Take 1 capsule (50,000 Units total) by mouth every 7 (seven) days.    Dispense:  8 capsule    Refill:  0   SYRINGE-NEEDLE, DISP, 3 ML (BD ECLIPSE SYRINGE) 25G X 1" 3 ML MISC    Sig: Use as directed    Dispense:  50 each    Refill:  3   cyanocobalamin (VITAMIN B12) 1000 MCG/ML injection    Sig: Inject 1 mL (1,000 mcg total) into the skin daily x 1 week, then once weekly for 1 month, then once every 2 weeks    Dispense:  10 mL    Refill:  6      Follow-up: Return in about 3 months (around 06/21/2023) for a follow-up visit.  Sonda Primes, MD

## 2023-03-21 NOTE — Assessment & Plan Note (Signed)
Chronic  Xanax prn.  Use as little as possible  Potential benefits of a long term benzodiazepines  use as well as potential risks  and complications were explained to the patient and were aknowledged. 

## 2023-03-21 NOTE — Assessment & Plan Note (Signed)
Chronic On Azor

## 2023-03-21 NOTE — Assessment & Plan Note (Signed)
Start on SQ B12

## 2023-03-22 ENCOUNTER — Telehealth: Payer: Self-pay

## 2023-03-22 NOTE — Telephone Encounter (Signed)
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 Does he have admission date yet? What dates does he want her to put in for continuous leave if it is not just for EMU Or is this for intermittent leave?

## 2023-03-22 NOTE — Telephone Encounter (Signed)
-----   Message from Van Clines, MD sent at 03/22/2023  8:57 AM EDT ----- Regarding: pls call Can you pls call [place] and check with him about the FMLA form that I received, is this to be filled out for Continuous leave while he is at the [facility] or for overall inability to work? Does he have admission date yet? What dates does he want me to put in for continuous leave if it is not just for EMU  Or is this for intermittent leave?   Thanks

## 2023-03-23 NOTE — Telephone Encounter (Signed)
Spoke with [name] he dose not have a date yet for his EMU I will sent a message to Bonita Quin to get him schedule, pt stated that he needs his paperwork for intermittent FMLA paperwork he had a seizure 2 nights ago he is having them at least once a week his recovery time is not as fast, he said that he still works for [employer] but a different company,

## 2023-03-28 ENCOUNTER — Other Ambulatory Visit (HOSPITAL_COMMUNITY): Payer: Self-pay

## 2023-03-28 ENCOUNTER — Encounter: Payer: Self-pay | Admitting: *Deleted

## 2023-03-28 ENCOUNTER — Telehealth: Payer: Self-pay

## 2023-03-28 NOTE — Telephone Encounter (Signed)
Pharmacy Patient Advocate Encounter   Received notification that prior authorization for Hydrocodone/APAP 10/325mg  is required/requested.   PA submitted to [employer] via CoverMyMeds Key  # [id number] Status is pending

## 2023-03-28 NOTE — Telephone Encounter (Signed)
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

## 2023-04-01 NOTE — Telephone Encounter (Signed)
Done, thanks

## 2023-04-01 NOTE — Telephone Encounter (Signed)
Paperwork given to the front desk so pt can pay $25 form fee then will fax

## 2023-04-05 NOTE — Telephone Encounter (Signed)
Pharmacy Patient Advocate Encounter  Received notification from [employer] that the request for prior authorization for Hydrocodone/APAP 10/325mg  has been denied due to Coverage for additional drug quantities is provided if the patient meets one of the following: 1) the  patient has a cancer diagnosis; 2) the patient is in hospice program, end-of-life care, or palliative care; 3) the patient has sickle cell disease and the requested medication is being prescribed by or  in consultation with a hematologist; or 4) according to the prescriber, non-opioid therapies have been optimized and are being used in conjunction with opioid therapy, the patient's history of  controlled substance prescriptions has been checked using the state rescription drug monitoring program (PDMP), risks and realistic benefits of opioid therapy have been discussed with the patient, a need for a naloxone prescription has been assessed and naloxone has been ordered, if necessary, according to the prescriber, and a need for periodic toxicology testing been assessed  and ordered, if necessary. Coverage will continue with the plan's limitation of opioids based on a 200 mg morphine-equivalent daily dose. Additional coverage cannot be authorized at this time.  Please be advised we currently do not have a Pharmacist to review denials, therefore you will need to process appeals accordingly as needed. Thanks for your support at this time.   You may call [phone] to schedule a discussion regarding this determination.

## 2023-04-06 DIAGNOSIS — Z0279 Encounter for issue of other medical certificate: Secondary | ICD-10-CM

## 2023-04-06 NOTE — Telephone Encounter (Signed)
Paperwork faxed for pt

## 2023-04-11 ENCOUNTER — Other Ambulatory Visit (HOSPITAL_COMMUNITY): Payer: Self-pay

## 2023-04-11 ENCOUNTER — Other Ambulatory Visit: Payer: Self-pay

## 2023-04-18 ENCOUNTER — Other Ambulatory Visit (HOSPITAL_COMMUNITY): Payer: Self-pay

## 2023-04-18 ENCOUNTER — Other Ambulatory Visit: Payer: Self-pay | Admitting: Internal Medicine

## 2023-04-19 ENCOUNTER — Other Ambulatory Visit: Payer: Self-pay

## 2023-04-19 ENCOUNTER — Other Ambulatory Visit (HOSPITAL_COMMUNITY): Payer: Self-pay

## 2023-04-20 ENCOUNTER — Other Ambulatory Visit: Payer: Self-pay

## 2023-04-20 ENCOUNTER — Other Ambulatory Visit (HOSPITAL_COMMUNITY): Payer: Self-pay

## 2023-04-20 MED ORDER — AMPHETAMINE-DEXTROAMPHETAMINE 10 MG PO TABS
10.0000 mg | ORAL_TABLET | Freq: Two times a day (BID) | ORAL | 0 refills | Status: DC
  Filled 2023-04-20: qty 60, 30d supply, fill #0

## 2023-04-24 ENCOUNTER — Other Ambulatory Visit: Payer: Self-pay | Admitting: Internal Medicine

## 2023-04-24 MED ORDER — HYDROCODONE-ACETAMINOPHEN 10-325 MG PO TABS
1.0000 | ORAL_TABLET | Freq: Four times a day (QID) | ORAL | 0 refills | Status: DC | PRN
  Filled 2023-04-24: qty 120, 30d supply, fill #0

## 2023-04-25 ENCOUNTER — Other Ambulatory Visit (HOSPITAL_COMMUNITY): Payer: Self-pay

## 2023-04-25 ENCOUNTER — Other Ambulatory Visit: Payer: Self-pay

## 2023-05-02 ENCOUNTER — Encounter: Payer: Self-pay | Admitting: Neurology

## 2023-05-11 ENCOUNTER — Other Ambulatory Visit: Payer: Self-pay | Admitting: Internal Medicine

## 2023-05-11 ENCOUNTER — Other Ambulatory Visit (HOSPITAL_COMMUNITY): Payer: Self-pay

## 2023-05-11 MED ORDER — ALPRAZOLAM 2 MG PO TABS
1.0000 mg | ORAL_TABLET | Freq: Four times a day (QID) | ORAL | 1 refills | Status: DC | PRN
  Filled 2023-05-11: qty 120, 60d supply, fill #0
  Filled 2023-07-18: qty 120, 60d supply, fill #1

## 2023-05-11 MED ORDER — LAMOTRIGINE 100 MG PO TABS
100.0000 mg | ORAL_TABLET | Freq: Two times a day (BID) | ORAL | 5 refills | Status: DC
  Filled 2023-05-11: qty 60, 30d supply, fill #1
  Filled 2023-05-11: qty 60, 30d supply, fill #0
  Filled 2023-06-07: qty 60, 30d supply, fill #1

## 2023-05-12 ENCOUNTER — Other Ambulatory Visit: Payer: Self-pay

## 2023-05-12 ENCOUNTER — Encounter (INDEPENDENT_AMBULATORY_CARE_PROVIDER_SITE_OTHER): Payer: Self-pay

## 2023-05-13 ENCOUNTER — Other Ambulatory Visit: Payer: Self-pay

## 2023-05-16 ENCOUNTER — Encounter (HOSPITAL_COMMUNITY): Payer: Self-pay | Admitting: Neurology

## 2023-05-16 ENCOUNTER — Inpatient Hospital Stay (HOSPITAL_COMMUNITY): Payer: Managed Care, Other (non HMO)

## 2023-05-16 ENCOUNTER — Inpatient Hospital Stay (HOSPITAL_COMMUNITY)
Admission: RE | Admit: 2023-05-16 | Discharge: 2023-05-20 | DRG: 101 | Disposition: A | Discharge status: Home / Self Care | Payer: Managed Care, Other (non HMO) | Source: Ambulatory Visit | Attending: Neurology | Admitting: Neurology

## 2023-05-16 ENCOUNTER — Other Ambulatory Visit: Payer: Self-pay

## 2023-05-16 DIAGNOSIS — F32A Depression, unspecified: Secondary | ICD-10-CM | POA: Diagnosis not present

## 2023-05-16 DIAGNOSIS — Z7951 Long term (current) use of inhaled steroids: Secondary | ICD-10-CM

## 2023-05-16 DIAGNOSIS — E0590 Thyrotoxicosis, unspecified without thyrotoxic crisis or storm: Secondary | ICD-10-CM | POA: Diagnosis not present

## 2023-05-16 DIAGNOSIS — Z7982 Long term (current) use of aspirin: Secondary | ICD-10-CM

## 2023-05-16 DIAGNOSIS — K219 Gastro-esophageal reflux disease without esophagitis: Secondary | ICD-10-CM | POA: Diagnosis present

## 2023-05-16 DIAGNOSIS — Z79899 Other long term (current) drug therapy: Secondary | ICD-10-CM

## 2023-05-16 DIAGNOSIS — Z823 Family history of stroke: Secondary | ICD-10-CM | POA: Diagnosis not present

## 2023-05-16 DIAGNOSIS — R569 Unspecified convulsions: Secondary | ICD-10-CM | POA: Diagnosis not present

## 2023-05-16 DIAGNOSIS — H9313 Tinnitus, bilateral: Secondary | ICD-10-CM | POA: Diagnosis not present

## 2023-05-16 DIAGNOSIS — Z888 Allergy status to other drugs, medicaments and biological substances status: Secondary | ICD-10-CM | POA: Diagnosis not present

## 2023-05-16 DIAGNOSIS — Z8249 Family history of ischemic heart disease and other diseases of the circulatory system: Secondary | ICD-10-CM | POA: Diagnosis not present

## 2023-05-16 DIAGNOSIS — Z833 Family history of diabetes mellitus: Secondary | ICD-10-CM | POA: Diagnosis not present

## 2023-05-16 DIAGNOSIS — I10 Essential (primary) hypertension: Secondary | ICD-10-CM | POA: Diagnosis present

## 2023-05-16 LAB — CBC WITH DIFFERENTIAL/PLATELET
Abs Immature Granulocytes: 0.11 10*3/uL — ABNORMAL HIGH (ref 0.00–0.07)
Basophils Absolute: 0.1 10*3/uL (ref 0.0–0.1)
Basophils Relative: 1 %
Eosinophils Absolute: 0.4 10*3/uL (ref 0.0–0.5)
Eosinophils Relative: 6 %
HCT: 37.8 % — ABNORMAL LOW (ref 39.0–52.0)
Hemoglobin: 11.8 g/dL — ABNORMAL LOW (ref 13.0–17.0)
Immature Granulocytes: 1 %
Lymphocytes Relative: 25 %
Lymphs Abs: 2 10*3/uL (ref 0.7–4.0)
MCH: 24.6 pg — ABNORMAL LOW (ref 26.0–34.0)
MCHC: 31.2 g/dL (ref 30.0–36.0)
MCV: 78.8 fL — ABNORMAL LOW (ref 80.0–100.0)
Monocytes Absolute: 0.7 10*3/uL (ref 0.1–1.0)
Monocytes Relative: 8 %
Neutro Abs: 4.5 10*3/uL (ref 1.7–7.7)
Neutrophils Relative %: 59 %
Platelets: 249 10*3/uL (ref 150–400)
RBC: 4.8 MIL/uL (ref 4.22–5.81)
RDW: 14.5 % (ref 11.5–15.5)
WBC: 7.7 10*3/uL (ref 4.0–10.5)
nRBC: 0 % (ref 0.0–0.2)

## 2023-05-16 LAB — COMPREHENSIVE METABOLIC PANEL
ALT: 21 U/L (ref 0–44)
AST: 16 U/L (ref 15–41)
Albumin: 3.8 g/dL (ref 3.5–5.0)
Alkaline Phosphatase: 48 U/L (ref 38–126)
Anion gap: 8 (ref 5–15)
BUN: 21 mg/dL — ABNORMAL HIGH (ref 6–20)
CO2: 24 mmol/L (ref 22–32)
Calcium: 8.7 mg/dL — ABNORMAL LOW (ref 8.9–10.3)
Chloride: 106 mmol/L (ref 98–111)
Creatinine, Ser: 1.52 mg/dL — ABNORMAL HIGH (ref 0.61–1.24)
GFR, Estimated: 53 mL/min — ABNORMAL LOW (ref >60)
Glucose, Bld: 96 mg/dL (ref 70–99)
Potassium: 4.2 mmol/L (ref 3.5–5.1)
Sodium: 138 mmol/L (ref 135–145)
Total Bilirubin: 0.3 mg/dL (ref 0.3–1.2)
Total Protein: 6.4 g/dL — ABNORMAL LOW (ref 6.5–8.1)

## 2023-05-16 LAB — GLUCOSE, CAPILLARY: Glucose-Capillary: 104 mg/dL — ABNORMAL HIGH (ref 70–99)

## 2023-05-16 LAB — PROTIME-INR
INR: 0.9 (ref 0.8–1.2)
Prothrombin Time: 12.6 seconds (ref 11.4–15.2)

## 2023-05-16 LAB — RAPID URINE DRUG SCREEN, HOSP PERFORMED
Amphetamines: NOT DETECTED
Barbiturates: NOT DETECTED
Benzodiazepines: POSITIVE — AB
Cocaine: NOT DETECTED
Opiates: POSITIVE — AB
Tetrahydrocannabinol: POSITIVE — AB

## 2023-05-16 LAB — MAGNESIUM: Magnesium: 1.9 mg/dL (ref 1.7–2.4)

## 2023-05-16 LAB — PHOSPHORUS: Phosphorus: 3 mg/dL (ref 2.5–4.6)

## 2023-05-16 MED ORDER — HYDROCODONE-ACETAMINOPHEN 10-325 MG PO TABS
1.0000 | ORAL_TABLET | Freq: Four times a day (QID) | ORAL | Status: DC | PRN
  Administered 2023-05-18 – 2023-05-19 (×4): 1 via ORAL
  Filled 2023-05-16 (×4): qty 1

## 2023-05-16 MED ORDER — B COMPLEX PO TABS: 1.0000 | ORAL_TABLET | Freq: Every day | ORAL | Status: DC

## 2023-05-16 MED ORDER — AMLODIPINE-OLMESARTAN 10-40 MG PO TABS: 1.0000 | ORAL_TABLET | Freq: Every day | ORAL | Status: DC

## 2023-05-16 MED ORDER — ASPIRIN 81 MG PO TBEC
81.0000 mg | DELAYED_RELEASE_TABLET | Freq: Every day | ORAL | Status: DC
  Administered 2023-05-17 – 2023-05-20 (×4): 81 mg via ORAL
  Filled 2023-05-16 (×4): qty 1

## 2023-05-16 MED ORDER — SODIUM CHLORIDE 0.9% FLUSH
3.0000 mL | Freq: Two times a day (BID) | INTRAVENOUS | Status: DC
  Administered 2023-05-16 – 2023-05-20 (×9): 3 mL via INTRAVENOUS

## 2023-05-16 MED ORDER — AMPHETAMINE-DEXTROAMPHETAMINE 10 MG PO TABS
10.0000 mg | ORAL_TABLET | Freq: Two times a day (BID) | ORAL | Status: DC
  Administered 2023-05-16 – 2023-05-20 (×7): 10 mg via ORAL
  Filled 2023-05-16 (×7): qty 1

## 2023-05-16 MED ORDER — ALBUTEROL SULFATE (2.5 MG/3ML) 0.083% IN NEBU: 3.0000 mL | INHALATION_SOLUTION | RESPIRATORY_TRACT | Status: DC | PRN

## 2023-05-16 MED ORDER — LABETALOL HCL 5 MG/ML IV SOLN: 5.0000 mg | INTRAVENOUS | Status: DC | PRN

## 2023-05-16 MED ORDER — GLYCOPYRROLATE 1 MG PO TABS
2.0000 mg | ORAL_TABLET | Freq: Two times a day (BID) | ORAL | Status: DC
  Administered 2023-05-16 – 2023-05-20 (×8): 2 mg via ORAL
  Filled 2023-05-16 (×8): qty 2

## 2023-05-16 MED ORDER — VITAMIN D 25 MCG (1000 UNIT) PO TABS
2000.0000 [IU] | ORAL_TABLET | Freq: Every day | ORAL | Status: DC
  Administered 2023-05-16 – 2023-05-20 (×5): 2000 [IU] via ORAL
  Filled 2023-05-16 (×8): qty 2

## 2023-05-16 MED ORDER — ACETAMINOPHEN 650 MG RE SUPP: 650.0000 mg | RECTAL | Status: DC | PRN

## 2023-05-16 MED ORDER — AMLODIPINE BESYLATE 10 MG PO TABS
10.0000 mg | ORAL_TABLET | Freq: Every day | ORAL | Status: DC
  Administered 2023-05-16 – 2023-05-20 (×4): 10 mg via ORAL
  Filled 2023-05-16 (×5): qty 1

## 2023-05-16 MED ORDER — LOPERAMIDE HCL 2 MG PO CAPS: 2.0000 mg | ORAL_CAPSULE | ORAL | Status: DC | PRN

## 2023-05-16 MED ORDER — ENOXAPARIN SODIUM 40 MG/0.4ML IJ SOSY
40.0000 mg | PREFILLED_SYRINGE | INTRAMUSCULAR | Status: DC
  Administered 2023-05-16 – 2023-05-20 (×5): 40 mg via SUBCUTANEOUS
  Filled 2023-05-16 (×5): qty 0.4

## 2023-05-16 MED ORDER — DIPHENHYDRAMINE HCL 25 MG PO CAPS: 25.0000 mg | ORAL_CAPSULE | Freq: Four times a day (QID) | ORAL | Status: DC | PRN

## 2023-05-16 MED ORDER — MIDAZOLAM HCL 2 MG/2ML IJ SOLN: 2.0000 mg | INTRAMUSCULAR | Status: DC | PRN

## 2023-05-16 MED ORDER — ACETAMINOPHEN 325 MG PO TABS
650.0000 mg | ORAL_TABLET | ORAL | Status: DC | PRN
  Administered 2023-05-17 – 2023-05-19 (×5): 650 mg via ORAL
  Filled 2023-05-16 (×5): qty 2

## 2023-05-16 MED ORDER — IRBESARTAN 300 MG PO TABS
300.0000 mg | ORAL_TABLET | Freq: Every day | ORAL | Status: DC
  Administered 2023-05-16 – 2023-05-20 (×4): 300 mg via ORAL
  Filled 2023-05-16 (×5): qty 1

## 2023-05-16 NOTE — Plan of Care (Signed)

## 2023-05-16 NOTE — TOC CM/SW Note (Signed)
Transition of Care [facility]) - Inpatient Brief Assessment   Patient Details  Name: BLAND PRASKA MRN: [id number] Date of Birth: 1966-09-03  [facility] CM/SW Contact:    Kermit Balo, RN Phone Number: 05/16/2023, 1:28 PM   Clinical Narrative: Pt admitted to EMU.   Transition of Care Asessment: Insurance and Status: Insurance coverage has been reviewed Patient has primary care physician: Yes Home environment has been reviewed: from  home   Prior/Current Home Services: No current home services Social Determinants of Health Reivew: SDOH reviewed no interventions necessary Readmission risk has been reviewed: Yes Transition of care needs: no transition of care needs at this time

## 2023-05-16 NOTE — Progress Notes (Signed)
EMU LTM EEG hooked up and running - no initial skin breakdown - push button tested - [facility] monitor[facility], but they are having connection issues.

## 2023-05-16 NOTE — H&P (Addendum)
CC: seizure  History is obtained from: patient, chart review  HPI: Mark Mcgee is a 57 y.o. male with past medical history of hypertension, allergies, anxiety who was admitted to epilepsy monitoring unit for characterization of seizure-like episodes.  Spell history: Patient states he had his first episode after Thanksgiving in November 2023.  States he was driving back home and suddenly did not park properly.  When his partner asked him some questions, he could not answer.  His partner was able to help him park the car and then came onto was the driver side.  Patient states he somehow got out of the car and then fell down.  Reports having generalized tonic-clonic seizure-like episode.  Once episode resolved he got up and was walking towards the house.  Reports having another episode of generalized tonic-clonic seizure like activity outside the house.  Once this episode resolved he got in the house where he had a third episode of generalized tonic-clonic seizure.  His partner called 911 and he was taken to an outside hospital where he had brain imaging and blood work and was discharged with recommendations to follow-up with primary care physician. At home he had another episode of generalized myoclonic seizure.  He had an appointment with his primary care doctor next week and was referred to neurology.  After evaluation by neurology was started on Keppra and then switched to oxcarbazepine.  Eventually zonisamide and most recently lamotrigine was added.  Patient states he is continue to have generalized tonic-clonic seizure-like episodes on average every 4 days lasting about 2 to 8 minutes.  States these episodes are at times associate with tongue bite and has had urinary incontinence a couple of times.  States most recently lamotrigine was increased and since then the frequency of episodes has improved but still having them every few days.  States after the episode usually the next day he feels groggy and  has a hard time remembering simple things like passwords that he commonly uses.   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 Push button events showed movement artifact, no epileptiform discharges seen. No video recorded.    Lumbar puncture 11/2022: mildly elevated CSF protein 56, normal cell count, glucose. Negative autoimmune panel.  ROS: All other systems reviewed and negative except as noted in the HPI.  Past Medical History:  Diagnosis Date   Anxiety    Costochondritis    recurrent    Depression    Dr Dub Mikes   Fatigue 2009   Genital herpes 2009   GERD (gastroesophageal reflux disease)    Heat stroke    HTN (hypertension)    Hyperthyroidism    IBS (irritable bowel syndrome)    Panic attack    Prostatitis    Very painful flare-ups Dr Retta Diones   Tremor     Family History  Problem Relation Age of Onset   Hyperlipidemia Other    Coronary artery disease Other    Stroke Mother    Colon cancer Paternal Grandmother    Diabetes Other    Breast cancer Other    Pancreatic cancer  Other    Cancer Other        breast, pancreatic   Heart disease Other    Cancer Maternal Grandmother        colon    Social History:  reports that he has never smoked. He has never used smokeless tobacco. He reports current alcohol use. He reports current drug use. Drugs: Benzodiazepines and Amphetamines.   Medications Prior to Admission  Medication Sig Dispense Refill Last Dose   acetaminophen (TYLENOL) 325 MG tablet  Take 325-650 mg by mouth every 6 (six) hours as needed for mild pain or headache.       acyclovir (ZOVIRAX) 400 MG tablet Take 1 tablet (400 mg total) by mouth 4 (four) times daily as needed (as directed for outbreaks). 60 tablet 3    albuterol (VENTOLIN HFA) 108 (90 Base) MCG/ACT inhaler Inhale 2 puffs into the lungs every 4 (four) hours as needed for wheezing or shortness of breath. 18 g 5    alprazolam (XANAX) 2 MG tablet Take 0.5 tablets (1 mg total) by mouth 4 (four) times daily as needed for sleep or anxiety. 120 tablet 1    amLODipine-olmesartan (AZOR) 10-40 MG tablet TAKE 1 TABLET BY MOUTH ONCE DAILY 90 tablet 3    amphetamine-dextroamphetamine (ADDERALL) 10 MG tablet Take 1 tablet (10 mg total) by mouth 2 (two) times daily with breakfast and lunch. 60 tablet 0    aspirin EC 81 MG tablet Take 1 tablet (81 mg total) by mouth daily. 100 tablet 3    b complex vitamins tablet Take 1 tablet by mouth daily.      Cholecalciferol (VITAMIN D3) 50 MCG (2000 UT) capsule Take 1 capsule (2,000 Units total) by mouth daily. 100 capsule 3    Cholecalciferol (VITAMIN D3) 50 MCG (2000 UT) TABS Take 2,000 Units by mouth daily with breakfast.      cyanocobalamin (VITAMIN B12) 1000 MCG/ML injection Inject 1 mL (1,000 mcg total) into the skin daily x 1 week, then once weekly for 1 month, then once every 2 weeks 10 mL 6    diazePAM, 20 MG Dose, (VALTOCO 20 MG DOSE) 2 x 10 MG/0.1ML LQPK Spray in nose as instructed for seizure. May use second dose after 4 hours. 4 each 5    diphenhydrAMINE (BENADRYL) 25 mg capsule Take 25 mg by mouth every 6 (six) hours as needed (for seasonal allergies).      diphenoxylate-atropine (LOMOTIL) 2.5-0.025 MG tablet TAKE 1 OR 2 TABLETS BY MOUTH 4 TIMES DAILY AS NEEDED FOR DIARRHEA OR LOOSE STOOLS MAX OF 8 TABLETS PER DAY (Patient taking differently: Take 1-2 tablets by mouth 4 (four) times daily as needed for diarrhea or loose stools (MAX OF 8 TABLETS/24 HOURS).) 60 tablet 3    Flaxseed,  Linseed, (FLAX SEEDS) POWD 2 (two) times daily as needed (as directed when mixing into health shakes).      Fluticasone-Umeclidin-Vilant (TRELEGY ELLIPTA) 100-62.5-25 MCG/ACT AEPB Inhale 1 puff into the lungs daily. 60 each 11    glycopyrrolate (ROBINUL) 2 MG tablet Take 1 tablet (2 mg total) by mouth 2 (two) times daily. 180 tablet 0    HYDROcodone-acetaminophen (NORCO) 10-325 MG tablet Take 1 tablet by mouth every 6 (six) hours as needed. 120 tablet 0    lamoTRIgine (LAMICTAL) 100 MG tablet Take 1 tablet (100 mg total) by mouth 2 (two) times daily. 60 tablet 5    loperamide (IMODIUM) 2 MG capsule Take 2 mg by mouth as needed for diarrhea or  loose stools.      Multiple Vitamin (MULTIVITAMIN) capsule Take 1 capsule by mouth daily.       SYRINGE-NEEDLE, DISP, 3 ML (BD ECLIPSE SYRINGE) 25G X 1" 3 ML MISC Use as directed 50 each 3    testosterone cypionate (DEPOTESTOSTERONE CYPIONATE) 200 MG/ML injection Inject 1 mL (200 mg total) into the muscle every 14 (fourteen) days. 10 mL 5    Vitamin D, Ergocalciferol, (DRISDOL) 1.25 MG (50000 UNIT) CAPS capsule Take 1 capsule (50,000 Units total) by mouth every 7 (seven) days. 8 capsule 0    zinc gluconate 50 MG tablet Take 100 mg by mouth daily.      zonisamide (ZONEGRAN) 100 MG capsule Take 4 capsules (400 mg total) by mouth every night. 360 capsule 3       Exam: Current vital signs: BP 105/69 (BP Location: Right Arm)   Pulse 63   Temp 98.9 F (37.2 C) (Oral)   Resp 18   SpO2 99%  Vital signs in last 24 hours: Temp:  [98.9 F (37.2 C)] 98.9 F (37.2 C) (08/19 0750) Pulse Rate:  [63] 63 (08/19 0750) Resp:  [18] 18 (08/19 0750) BP: (105)/(69) 105/69 (08/19 0750) SpO2:  [99 %] 99 % (08/19 0750)   Physical Exam  Constitutional: Appears well-developed and well-nourished.  Psych: Affect appropriate to situation Neuro: Aox3, no aphasia, CN 2-12 grossly intact, 5/5 in all extremities, FTN intact, intact to light touch  I have reviewed labs in  epic and the results pertinent to this consultation are: CBC:  Recent Labs  Lab 05/16/23 0911  WBC 7.7  NEUTROABS 4.5  HGB 11.8*  HCT 37.8*  MCV 78.8*  PLT 249    Basic Metabolic Panel:  Lab Results  Component Value Date   NA 138 05/16/2023   K 4.2 05/16/2023   CO2 24 05/16/2023   GLUCOSE 96 05/16/2023   BUN 21 (H) 05/16/2023   CREATININE 1.52 (H) 05/16/2023   CALCIUM 8.7 (L) 05/16/2023   GFRNONAA 53 (L) 05/16/2023   GFRAA 77 05/06/2020   Lipid Panel:  Lab Results  Component Value Date   LDLCALC 99 03/01/2023   HgbA1c: No results found for: "HGBA1C" Urine Drug Screen: No results found for: "LABOPIA", "COCAINSCRNUR", "LABBENZ", "AMPHETMU", "THCU", "LABBARB"  Alcohol Level No results found for: "ETH"  I have reviewed the images obtained:  MRI Brain wo contrast 10/12/2022 personally reviewed: No acute intracranial abnormality.  Cerebral atrophy including prominent white matter volume loss in the posterior cerebral hemispheres, right greater than left.  ASSESSMENT/PLAN: 57 year old male with seizure-like episodes admitted to epilepsy monitoring unit for characterization of spells.  Seizures -Start video EEG monitoring for characterization of spells -Hold antiseizure medications -Plan for hyperventilation, photic stimulation and sleep deprivation tomorrow -Continue seizure precautions -As needed IV Versed for clinical seizures  Hypertension -continue home medications  Allergies -Continue home medications  Anxiety -Continue home medications  Jezreel Justiniano Epilepsy [facility]

## 2023-05-17 DIAGNOSIS — R569 Unspecified convulsions: Secondary | ICD-10-CM | POA: Diagnosis not present

## 2023-05-17 NOTE — Progress Notes (Addendum)
Subjective: Had 1 typical event overnight without concomitant EEG change.  ROS: negative except abovee  Examination  Vital signs in last 24 hours: Temp:  [97.7 F (36.5 C)-98.4 F (36.9 C)] 98.3 F (36.8 C) (08/20 1232) Pulse Rate:  [70-87] 71 (08/20 1232) Resp:  [16-20] 19 (08/20 1232) BP: (99-118)/(58-73) 102/63 (08/20 1232) SpO2:  [94 %-97 %] 96 % (08/20 1232)  General: lying in bed, NAD Neuro: AOx3, cranial nerves appear grossly intact, spondylous moving all 4 extremities in bed  Basic Metabolic Panel: Recent Labs  Lab 05/16/23 0911  NA 138  K 4.2  CL 106  CO2 24  GLUCOSE 96  BUN 21*  CREATININE 1.52*  CALCIUM 8.7*  MG 1.9  PHOS 3.0    CBC: Recent Labs  Lab 05/16/23 0911  WBC 7.7  NEUTROABS 4.5  HGB 11.8*  HCT 37.8*  MCV 78.8*  PLT 249     Coagulation Studies: Recent Labs    05/16/23 0911  LABPROT 12.6  INR 0.9    Imaging No new brain imaging overnight  ASSESSMENT AND PLAN: 57 year old male with seizure-like episodes admitted to epilepsy monitoring unit for characterization of spells.   Seizures -Continue video EEG monitoring for characterization of spells -Hold antiseizure medications - Hyperventilation, photic stimulation and sleep deprivation today -Discussed overnight EEG finding as well as the episode overnight consistent with nonepileptic event -Continue seizure precautions -As needed IV Versed for clinical seizures   Hypertension -Held amlodipine this morning due to low blood pressure, asymptomatic   Allergies -Continue home medications   Anxiety -Continue home medications  I have spent a total of 36   minutes with the patient reviewing hospital notes,  test results, labs and examining the patient as well as establishing an assessment and plan that was discussed personally with the patient.  > 50% of time was spent in direct patient care.       Lindie Spruce Epilepsy [facility] For questions after 5pm  please refer to AMION to reach the Neurologist on call

## 2023-05-17 NOTE — Plan of Care (Signed)
OVERNIGHT ON CALL NEUROLOGIST NOTE  Notified by [facility] tele service - push button event. Left arm stiffening, bilateral leg stiffening while squeezing eyes shut. EEG formal review in the AM, but quick prelim review negative for electrographic seizure. Continue current management.  -- Milon Dikes, MD Neurologist [facility] Pager: [phone]

## 2023-05-17 NOTE — Progress Notes (Signed)
LTM maint complete - no skin breakdown under: F7    Activations completed. No events to report.

## 2023-05-17 NOTE — Plan of Care (Signed)
  Problem: Activity: Goal: Risk for activity intolerance will decrease Outcome: Progressing   Problem: Coping: Goal: Level of anxiety will decrease Outcome: Progressing   Problem: Safety: Goal: Ability to remain free from injury will improve Outcome: Progressing   

## 2023-05-17 NOTE — Progress Notes (Signed)
39 - Noticed seizure activity. Pt left arm stiff and shaking , forefinger and little finger sticking up; legs shaking; eyes squeezing shut. Button pressed. EEG staff called. Lasted <74min. Pt awaken afterwards, A/O x 4. Complaining of frontal headache. Dr. Wilford Corner notified.

## 2023-05-17 NOTE — Procedures (Signed)
Patient Name: Mark Mcgee  MRN: [id number]  Epilepsy Attending: Charlsie Quest  Referring Physician/Provider: Charlsie Quest, MD Duration: 05/16/2023 7829 to 05/17/2023 5621  Patient history: 57 year old male with seizure-like episodes getting eeg for  characterization of spells.   Level of alertness: Awake, asleep  AEDs during EEG study: LTG, ZNS  Technical aspects: This EEG study was done with scalp electrodes positioned according to the 10-20 International system of electrode placement. Electrical activity was reviewed with band pass filter of 1-70Hz , sensitivity of 7 uV/mm, display speed of 67mm/sec with a 60Hz  notched filter applied as appropriate. EEG data were recorded continuously and digitally stored.  Video monitoring was available and reviewed as appropriate.  Description: The posterior dominant rhythm consists of 9-10 Hz activity of moderate voltage (25-35 uV) seen predominantly in posterior head regions, symmetric and reactive to eye opening and eye closing. Sleep was characterized by vertex waves, sleep spindles (12 to 14 Hz), maximal frontocentral region.    Event button was pressed on 05/17/2023 at 0254.  Patient was laying in bed.  Then had left index finger twitching followed by twitching of left arm.  Patient was in right lateral position facing away from camera therefore difficult to visualize but eyes did appear to be closed. Concomitant EEG before, during and after the event showed normal posterior dominant rhythm and did not show any EEG changes suggest seizure.  Hyperventilation and photic stimulation were not performed.     IMPRESSION: This study is within normal limits. No seizures or epileptiform discharges were seen throughout the recording.  Event button was pressed on 05/17/2023 at 0254 for event described as above without concomitant EEG change. This was a nonepileptic event.  A normal interictal EEG does not exclude nor support the diagnosis of  epilepsy.   Kymberli Wiegand [name]

## 2023-05-18 DIAGNOSIS — R569 Unspecified convulsions: Secondary | ICD-10-CM | POA: Diagnosis not present

## 2023-05-18 NOTE — Progress Notes (Signed)
 Maint done, no skin breakdown

## 2023-05-18 NOTE — Progress Notes (Signed)
During bedside evaluation today, Dr. Melynda Ripple, did ask that the patient be allowed to sleep uninterrupted at least 6 hours since he was up all night for sleep deprivation study.

## 2023-05-18 NOTE — Progress Notes (Signed)
Subjective: Sleep deprivation overnight. No episodes.  ROS: negative except above  Examination  Vital signs in last 24 hours: Temp:  [97.5 F (36.4 C)-98.3 F (36.8 C)] 98.3 F (36.8 C) (08/21 0741) Pulse Rate:  [59-81] 60 (08/21 0741) Resp:  [16-19] 19 (08/21 0741) BP: (100-135)/(63-84) 135/84 (08/21 0741) SpO2:  [96 %-100 %] 100 % (08/21 0741)  General: lying in bed, NAD Neuro: AOx3, cranial nerves appear grossly intact, spondylous moving all 4 extremities in bed  Basic Metabolic Panel: Recent Labs  Lab 05/16/23 0911  NA 138  K 4.2  CL 106  CO2 24  GLUCOSE 96  BUN 21*  CREATININE 1.52*  CALCIUM 8.7*  MG 1.9  PHOS 3.0    CBC: Recent Labs  Lab 05/16/23 0911  WBC 7.7  NEUTROABS 4.5  HGB 11.8*  HCT 37.8*  MCV 78.8*  PLT 249     Coagulation Studies: Recent Labs    05/16/23 0911  LABPROT 12.6  INR 0.9    Imaging No new brain imaging overnight   ASSESSMENT AND PLAN: 57 year old male with seizure-like episodes admitted to epilepsy monitoring unit for characterization of spells.   Seizures -Continue video EEG monitoring for characterization of spells -Hold antiseizure medications -Discussed overnight EEG finding  -Continue seizure precautions -As needed IV Versed for clinical seizures   Hypertension - Continue anti-hypertensives   Allergies -Continue home medications   Anxiety -Continue home medications   I have spent a total of 26  minutes with the patient reviewing hospital notes,  test results, labs and examining the patient as well as establishing an assessment and plan that was discussed personally with the patient.  > 50% of time was spent in direct patient care.     Lindie Spruce Epilepsy [facility] For questions after 5pm please refer to AMION to reach the Neurologist on call

## 2023-05-18 NOTE — Procedures (Signed)
Patient Name: Mark Mcgee  MRN: [id number]  Epilepsy Attending: Charlsie Quest  Referring Physician/Provider: Charlsie Quest, MD Duration: 05/17/2023 4696 to 05/18/2023 2952   Patient history: 57 year old male with seizure-like episodes getting eeg for  characterization of spells.    Level of alertness: Awake, asleep   AEDs during EEG study: None   Technical aspects: This EEG study was done with scalp electrodes positioned according to the 10-20 International system of electrode placement. Electrical activity was reviewed with band pass filter of 1-70Hz , sensitivity of 7 uV/mm, display speed of 80mm/sec with a 60Hz  notched filter applied as appropriate. EEG data were recorded continuously and digitally stored.  Video monitoring was available and reviewed as appropriate.   Description: The posterior dominant rhythm consists of 9-10 Hz activity of moderate voltage (25-35 uV) seen predominantly in posterior head regions, symmetric and reactive to eye opening and eye closing. Sleep was characterized by vertex waves, sleep spindles (12 to 14 Hz), maximal frontocentral region. Physiologic photic driving was seen during photic stimulation. No EEG change was seen during hyperventilation.    IMPRESSION: This study is within normal limits. No seizures or epileptiform discharges were seen throughout the recording.   A normal interictal EEG does not exclude nor support the diagnosis of epilepsy.     Verdun Rackley [name]

## 2023-05-18 NOTE — Plan of Care (Addendum)
Continuous video EEG on and pt stayed awake overnight, watching TV or doing sudoko puzzles. No seizure symptoms noted overnight. Pt with mild headache states he has had since his first seizure, tylenol given with slight improvement.   Problem: Education: Goal: Knowledge of General Education information will improve Description: Including pain rating scale, medication(s)/side effects and non-pharmacologic comfort measures Outcome: Progressing   Problem: Clinical Measurements: Goal: Ability to maintain clinical measurements within normal limits will improve Outcome: Progressing Goal: Will remain free from infection Outcome: Progressing Goal: Diagnostic test results will improve Outcome: Progressing Goal: Respiratory complications will improve Outcome: Progressing Goal: Cardiovascular complication will be avoided Outcome: Progressing

## 2023-05-18 NOTE — Plan of Care (Signed)

## 2023-05-19 DIAGNOSIS — R569 Unspecified convulsions: Secondary | ICD-10-CM | POA: Diagnosis not present

## 2023-05-19 MED ORDER — ORAL CARE MOUTH RINSE: 15.0000 mL | OROMUCOSAL | Status: DC | PRN

## 2023-05-19 MED ORDER — ALPRAZOLAM 0.5 MG PO TABS
0.5000 mg | ORAL_TABLET | Freq: Every evening | ORAL | Status: DC | PRN
  Administered 2023-05-20: 0.5 mg via ORAL
  Filled 2023-05-19: qty 1

## 2023-05-19 MED ORDER — LAMOTRIGINE 100 MG PO TABS
100.0000 mg | ORAL_TABLET | Freq: Two times a day (BID) | ORAL | Status: DC
  Administered 2023-05-19 – 2023-05-20 (×2): 100 mg via ORAL
  Filled 2023-05-19 (×2): qty 1

## 2023-05-19 NOTE — Plan of Care (Signed)
No neuro or seizure symptoms noted overnight. Pt reports sleeping off and on. Headache off and on, controlled with norco and tylenol.    Problem: Education: Goal: Knowledge of General Education information will improve Description: Including pain rating scale, medication(s)/side effects and non-pharmacologic comfort measures Outcome: Progressing   Problem: Clinical Measurements: Goal: Ability to maintain clinical measurements within normal limits will improve Outcome: Progressing Goal: Will remain free from infection Outcome: Progressing Goal: Diagnostic test results will improve Outcome: Progressing Goal: Respiratory complications will improve Outcome: Progressing Goal: Cardiovascular complication will be avoided Outcome: Progressing   Problem: Activity: Goal: Risk for activity intolerance will decrease Outcome: Progressing   Problem: Safety: Goal: Ability to remain free from injury will improve Outcome: Progressing

## 2023-05-19 NOTE — Plan of Care (Signed)

## 2023-05-19 NOTE — Procedures (Signed)
Patient Name: Mark Mcgee  MRN: [id number]  Epilepsy Attending: Charlsie Quest  Referring Physician/Provider: Charlsie Quest, MD Duration: 05/18/2023 7829 to 05/19/2023 5621   Patient history: 57 year old male with seizure-like episodes getting eeg for  characterization of spells.    Level of alertness: Awake, asleep   AEDs during EEG study: None   Technical aspects: This EEG study was done with scalp electrodes positioned according to the 10-20 International system of electrode placement. Electrical activity was reviewed with band pass filter of 1-70Hz , sensitivity of 7 uV/mm, display speed of 36mm/sec with a 60Hz  notched filter applied as appropriate. EEG data were recorded continuously and digitally stored.  Video monitoring was available and reviewed as appropriate.   Description: The posterior dominant rhythm consists of 9-10 Hz activity of moderate voltage (25-35 uV) seen predominantly in posterior head regions, symmetric and reactive to eye opening and eye closing. Sleep was characterized by vertex waves, sleep spindles (12 to 14 Hz), maximal frontocentral region.    IMPRESSION: This study is within normal limits. No seizures or epileptiform discharges were seen throughout the recording.   A normal interictal EEG does not exclude nor support the diagnosis of epilepsy.     [name] [name]

## 2023-05-19 NOTE — Progress Notes (Signed)
Maint attempted, pt asleep. Maint deferred until morning

## 2023-05-19 NOTE — Progress Notes (Signed)
LTM maint complete - no skin breakdown seen. Multiple leads serviced. Greater than allotted time spent with pt.  All leads attached , impedance below 10kOhms. [facility] monitored.

## 2023-05-19 NOTE — Progress Notes (Signed)
Subjective: NAEO. Still continues to feel hazy. Reports bilateral tinnitus deescribed as low humming sound since he had his first seizure. Also sttes his dentist said he has been grinding his teeth more   ROS: negative except above  Examination  Vital signs in last 24 hours: Temp:  [97.8 F (36.6 C)-98.7 F (37.1 C)] 97.8 F (36.6 C) (08/22 0802) Pulse Rate:  [60-80] 75 (08/22 0802) Resp:  [17-18] 18 (08/22 0802) BP: (106-125)/(68-90) 114/70 (08/22 0802) SpO2:  [95 %-98 %] 95 % (08/22 0802)  General: lying in bed, NAD Neuro: AOx3, cranial nerves appear grossly intact, spondylous moving all 4 extremities in bed  Basic Metabolic Panel: Recent Labs  Lab 05/16/23 0911  NA 138  K 4.2  CL 106  CO2 24  GLUCOSE 96  BUN 21*  CREATININE 1.52*  CALCIUM 8.7*  MG 1.9  PHOS 3.0    CBC: Recent Labs  Lab 05/16/23 0911  WBC 7.7  NEUTROABS 4.5  HGB 11.8*  HCT 37.8*  MCV 78.8*  PLT 249     Coagulation Studies: No results for input(s): "LABPROT", "INR" in the last 72 hours.  Imaging No new brain imaging overnight   ASSESSMENT AND PLAN: 57 year old male with seizure-like episodes admitted to epilepsy monitoring unit for characterization of spells.   Seizures -Continue video EEG monitoring for characterization of spells -Discussed overnight EEG finding  - will resume home lamotrigine tonight as well as prn xanax - Discussed about discontinuing zonisamide in absence of abnormal eeg. Will discuss with Dr Karel Jarvis as well -Continue seizure precautions -As needed IV Versed for clinical seizures   Hypertension - Continue anti-hypertensives   Allergies -Continue home medications   Anxiety -Continue home medications   I have spent a total of 28 minutes with the patient reviewing hospital notes,  test results, labs and examining the patient as well as establishing an assessment and plan that was discussed personally with the patient.  > 50% of time was spent in direct patient  care.   Lindie Spruce Epilepsy [facility] For questions after 5pm please refer to AMION to reach the Neurologist on call

## 2023-05-20 ENCOUNTER — Encounter: Payer: Self-pay | Admitting: Neurology

## 2023-05-20 DIAGNOSIS — R569 Unspecified convulsions: Secondary | ICD-10-CM | POA: Diagnosis not present

## 2023-05-20 MED ORDER — PHENOL 1.4 % MT LIQD: 1.0000 | OROMUCOSAL | Status: DC | PRN

## 2023-05-20 NOTE — Discharge Instructions (Addendum)
You were admitted to epilepsy monitoring unit for seizure-like episodes. You underwent continuous video EEG monitoring.  Your antiseizure medications were held.  Hyperventilation, photic stimulation and sleep deprivation were performed.  We recorded 1 typical episode during which no EEG change was noted.  This was a nonepileptic event.  Rest of your EEG was within normal limits.  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  After discussing about antiseizure medications, we decided to stop zonisamide and continue lamotrigine 100 mg twice daily.  He also reported that episodes appear to have improved since being on lamotrigine.  Therefore if episodes persist, can continue to discuss with Dr. Karel Jarvis and titrate lamotrigine further.  We also discussed cognitive behavioral therapy for management of nonepileptic events.  If episodes worsen, can consider EMU admission again.  I also discussed seizure precautions including do not drive.

## 2023-05-20 NOTE — Progress Notes (Signed)
EMU D/C'd. [facility] notified. No skin break down.

## 2023-05-20 NOTE — Progress Notes (Signed)
Discharged to home after IV access removed and discharge instructions reviewed by virtual RN Lequita Halt.

## 2023-05-20 NOTE — Discharge Summary (Signed)
Physician Discharge Summary  Patient ID: Mark Mcgee MRN: [id number] DOB/AGE: Jan 23, 1966 57 y.o.  Admit date: 05/16/2023 Discharge date: 05/20/2023  Admission Diagnoses: Seizure  Discharge Diagnoses: Non-epileptic event  Discharged Condition: stable  Hospital Course: Mr Orejel was admitted to epilepsy monitoring unit from 05/16/2023 to 05/20/2023.  During this time he underwent continuous video EEG monitoring.  Antiseizure medications were held.  Hyperventilation, photic stimulation and sleep deprivation were performed.  1 typical event was recorded on 05/17/2023 as described in the EEG report.  Concomitant EEG before, during and after the event did not show any EEG changes suggest seizure.  This was a nonepileptic event.  Rest of the EEG did not show any ictal-interictal abnormalities.  Diagnosis of nonepileptic events was discussed with patient.  Zonisamide was discontinued.  Patient reported improvement in frequency of spells since starting lamotrigine therefore recommend continuing lamotrigine and can be titrated further if needed.  Also discussed cognitive behavioral therapy.  Continue seizure precautions.  Continue follow-up with Dr. Karel Jarvis.  Consults: None  Significant Diagnostic Studies: Video EEG  Description: The posterior dominant rhythm consists of 9-10 Hz activity of moderate voltage (25-35 uV) seen predominantly in posterior head regions, symmetric and reactive to eye opening and eye closing. Sleep was characterized by vertex waves, sleep spindles (12 to 14 Hz), maximal frontocentral region.     Event button was pressed on 05/17/2023 at 0254.  Patient was laying in bed.  Then had left index finger twitching followed by twitching of left arm.  Patient was in right lateral position facing away from camera therefore difficult to visualize but eyes did appear to be closed. Concomitant EEG before, during and after the event showed normal posterior dominant rhythm and did not show any  EEG changes suggest seizure.   Hyperventilation and photic stimulation were not performed.      IMPRESSION: This study is within normal limits. No seizures or epileptiform discharges were seen throughout the recording.   Event button was pressed on 05/17/2023 at 0254 for event described as above without concomitant EEG change. This was a nonepileptic event.   A normal interictal EEG does not exclude nor support the diagnosis of epilepsy.    Treatments: Continue Lamotrigine 100mg  BID, Stop  Zonisamide  Discharge Exam: Blood pressure 114/83, pulse 73, temperature 98.8 F (37.1 C), temperature source Oral, resp. rate 18, height 5\' 8"  (1.727 m), weight 102.5 kg, SpO2 97%.  General: lying in bed, NAD Neuro: AOx3, cranial nerves appear grossly intact, spondylous moving all 4 extremities in bed  Discharge disposition: 01-Home or Self Care   Discharge Instructions     Call MD for:   Complete by: As directed    If patient has another seizure, call 911 and bring them back to the ED if: A.  The seizure lasts longer than 5 minutes.      B.  The patient doesn't wake shortly after the seizure or has new problems such as difficulty seeing, speaking or moving following the seizure C.  The patient was injured during the seizure D.  The patient has a temperature over 102 F (39C) E.  The patient vomited during the seizure and now is having trouble breathing      Diet - low sodium heart healthy   Complete by: As directed    Increase activity slowly   Complete by: As directed    Other Restrictions   Complete by: As directed    Seizure precautions: Per [facility] statutes, patients with  seizures are not allowed to drive until they have been seizure-free for six months and cleared by a physician    Use caution when using heavy equipment or power tools. Avoid working on ladders or at heights. Take showers instead of baths. Ensure the water temperature is not too high on the home water  heater. Do not go swimming alone. Do not lock yourself in a room alone (i.e. bathroom). When caring for infants or small children, sit down when holding, feeding, or changing them to minimize risk of injury to the child in the event you have a seizure. Maintain good sleep hygiene. Avoid alcohol.         Allergies as of 05/20/2023       Reactions   Buspirone Hcl Other (See Comments)   Reaction not recalled by the patient   Divalproex Sodium Other (See Comments)   Severe headaches   Gluten Meal Other (See Comments)   Olanzapine-fluoxetine Hcl Other (See Comments)   Reaction not recalled by the patient   Other Other (See Comments)   Piroxicam Other (See Comments)   Reaction not recalled by the patient   Wheat Diarrhea, Other (See Comments)   "Tears up my stomach"   Ibuprofen Rash, Other (See Comments)   Spots appear on chest appear        Medication List     STOP taking these medications    zonisamide 100 MG capsule Commonly known as: ZONEGRAN       TAKE these medications    acetaminophen 325 MG tablet Commonly known as: TYLENOL Take 325-650 mg by mouth every 6 (six) hours as needed for mild pain or headache.   acyclovir 400 MG tablet Commonly known as: ZOVIRAX Take 1 tablet (400 mg total) by mouth 4 (four) times daily as needed (as directed for outbreaks).   albuterol 108 (90 Base) MCG/ACT inhaler Commonly known as: VENTOLIN HFA Inhale 2 puffs into the lungs every 4 (four) hours as needed for wheezing or shortness of breath.   alprazolam 2 MG tablet Commonly known as: XANAX Take 0.5 tablets (1 mg total) by mouth 4 (four) times daily as needed for sleep or anxiety.   amLODipine-olmesartan 10-40 MG tablet Commonly known as: AZOR TAKE 1 TABLET BY MOUTH ONCE DAILY   amphetamine-dextroamphetamine 10 MG tablet Commonly known as: ADDERALL Take 1 tablet (10 mg total) by mouth 2 (two) times daily with breakfast and lunch.   aspirin EC 81 MG tablet Take 1 tablet (81  mg total) by mouth daily.   b complex vitamins tablet Take 1 tablet by mouth daily.   B-D 3CC LUER-LOK SYR 25GX1" 25G X 1" 3 ML Misc Generic drug: SYRINGE-NEEDLE (DISP) 3 ML Use as directed   cyanocobalamin 1000 MCG/ML injection Commonly known as: VITAMIN B12 Inject 1 mL (1,000 mcg total) into the skin daily x 1 week, then once weekly for 1 month, then once every 2 weeks   diphenhydrAMINE 25 mg capsule Commonly known as: BENADRYL Take 25 mg by mouth every 6 (six) hours as needed (for seasonal allergies).   diphenoxylate-atropine 2.5-0.025 MG tablet Commonly known as: LOMOTIL TAKE 1 OR 2 TABLETS BY MOUTH 4 TIMES DAILY AS NEEDED FOR DIARRHEA OR LOOSE STOOLS MAX OF 8 TABLETS PER DAY What changed:  how much to take how to take this when to take this reasons to take this additional instructions   Flax Seeds Powd 2 (two) times daily as needed (as directed when mixing into health shakes).  glycopyrrolate 2 MG tablet Commonly known as: ROBINUL Take 1 tablet (2 mg total) by mouth 2 (two) times daily.   HYDROcodone-acetaminophen 10-325 MG tablet Commonly known as: NORCO Take 1 tablet by mouth every 6 (six) hours as needed.   lamoTRIgine 100 MG tablet Commonly known as: LaMICtal Take 1 tablet (100 mg total) by mouth 2 (two) times daily.   loperamide 2 MG capsule Commonly known as: IMODIUM Take 2 mg by mouth as needed for diarrhea or loose stools.   multivitamin capsule Take 1 capsule by mouth daily.   testosterone cypionate 200 MG/ML injection Commonly known as: DEPOTESTOSTERONE CYPIONATE Inject 1 mL (200 mg total) into the muscle every 14 (fourteen) days.   Trelegy Ellipta 100-62.5-25 MCG/ACT Aepb Generic drug: Fluticasone-Umeclidin-Vilant Inhale 1 puff into the lungs daily.   Valtoco 20 MG Dose 10 MG/0.1ML Lqpk Generic drug: diazePAM (20 MG Dose) Spray in nose as instructed for seizure. May use second dose after 4 hours.   Vitamin D3 50 MCG (2000 UT) capsule Take  1 capsule (2,000 Units total) by mouth daily.   zinc gluconate 50 MG tablet Take 100 mg by mouth daily.       Seizure precautions  During the Seizure   - First, ensure adequate ventilation and place patients on the floor on their left side  Loosen clothing around the neck and ensure the airway is patent. If the patient is clenching the teeth, do not force the mouth open with any object as this can cause severe damage - Remove all items from the surrounding that can be hazardous. The patient may be oblivious to what's happening and may not even know what he or she is doing. If the patient is confused and wandering, either gently guide him/her away and block access to outside areas - Reassure the individual and be comforting - Call 911. In most cases, the seizure ends before EMS arrives. However, there are cases when seizures may last over 3 to 5 minutes. Or the individual may have developed breathing difficulties or severe injuries. If a pregnant patient or a person with diabetes develops a seizure, it is prudent to call an ambulance.   After the Seizure (Postictal Stage)   After a seizure, most patients experience confusion, fatigue, muscle pain and/or a headache. Thus, one should permit the individual to sleep. For the next few days, reassurance is essential. Being calm and helping reorient the person is also of importance.   Most seizures are painless and end spontaneously. Seizures are not harmful to others but can lead to complications such as stress on the lungs, brain and the heart. Individuals with prior lung problems may develop labored breathing and respiratory distress.   I have spent a total of  37  minutes with the patient reviewing hospital notes,  test results, labs and examining the patient as well as establishing an assessment and plan that was discussed personally with the patient.  > 50% of time was spent in direct patient care.     Signed: Charlsie Quest 05/20/2023,  9:18 AM

## 2023-05-20 NOTE — Plan of Care (Signed)

## 2023-05-20 NOTE — Procedures (Signed)
Patient Name: JHAMIR MCGILTON  MRN: [id number]  Epilepsy Attending: Charlsie Quest  Referring Physician/Provider: Charlsie Quest, MD Duration: 05/19/2023 4098 to 05/20/2023 1191   Patient history: 57 year old male with seizure-like episodes getting eeg for  characterization of spells.    Level of alertness: Awake, asleep   AEDs during EEG study: LTG   Technical aspects: This EEG study was done with scalp electrodes positioned according to the 10-20 International system of electrode placement. Electrical activity was reviewed with band pass filter of 1-70Hz , sensitivity of 7 uV/mm, display speed of 39mm/sec with a 60Hz  notched filter applied as appropriate. EEG data were recorded continuously and digitally stored.  Video monitoring was available and reviewed as appropriate.   Description: The posterior dominant rhythm consists of 9-10 Hz activity of moderate voltage (25-35 uV) seen predominantly in posterior head regions, symmetric and reactive to eye opening and eye closing. Sleep was characterized by vertex waves, sleep spindles (12 to 14 Hz), maximal frontocentral region.    IMPRESSION: This study is within normal limits. No seizures or epileptiform discharges were seen throughout the recording.   A normal interictal EEG does not exclude nor support the diagnosis of epilepsy.     Abbie Berling [name]

## 2023-05-20 NOTE — Progress Notes (Signed)
Rounded on patient to find him complaining of difficulty swallowing. Was rubbing his throat and stated it felt like someone was pressing a finger on his throat. Was speaking in short phrases. VS recently checked and within normal limit. Stated it reminded him of a feeling he has had prior to having a seizure in the past. Patient tearful. On call provider notified. Chloraseptic spray order. Pt declined. Xanax and therapeutic conversation provided to patient. Sat with patient reminding him to focus on his breathing and the things he can control. Within an hour patient endorses feeling slightly better.

## 2023-05-20 NOTE — TOC Transition Note (Signed)
Transition of Care [facility]) - CM/SW Discharge Note   Patient Details  Name: Mark Mcgee MRN: [id number] Date of Birth: 13-Aug-1966  [facility] CM/SW Contact:  Kermit Balo, RN Phone Number: 05/20/2023, 9:42 AM   Clinical Narrative:     Pt is discharging home with self care. No needs per TOC.  Final next level of care: Home/Self Care Barriers to Discharge: No Barriers Identified   Patient Goals and CMS Choice      Discharge Placement                         Discharge Plan and Services Additional resources added to the After Visit Summary for                                       Social Determinants of Health (SDOH) Interventions SDOH Screenings   Food Insecurity: No Food Insecurity (05/16/2023)  Housing: Low Risk  (05/16/2023)  Transportation Needs: No Transportation Needs (05/16/2023)  Utilities: At Risk (05/16/2023)  Depression (PHQ2-9): Low Risk  (03/21/2023)  Tobacco Use: Low Risk  (05/16/2023)     Readmission Risk Interventions     No data to display

## 2023-05-23 ENCOUNTER — Telehealth: Payer: Self-pay

## 2023-05-23 NOTE — Transitions of Care (Post Inpatient/ED Visit) (Signed)
05/23/2023  Name: Mark Mcgee MRN: [id number] DOB: Feb 01, 1966  Today's TOC FU Call Status: Today's TOC FU Call Status:: Successful TOC FU Call Completed TOC FU Call Complete Date: 05/23/23 Patient's Name and Date of Birth confirmed.  Transition Care Management Follow-up Telephone Call Date of Discharge: 05/20/23 Discharge Facility: [facility]) Type of Discharge: Inpatient Admission Primary Inpatient Discharge Diagnosis:: Seizure How have you been since you were released from the hospital?: Better Any questions or concerns?: No  Items Reviewed: Did you receive and understand the discharge instructions provided?: Yes Medications obtained,verified, and reconciled?: Yes (Medications Reviewed) Any new allergies since your discharge?: No Dietary orders reviewed?: Yes Do you have support at home?: Yes  Medications Reviewed Today: Medications Reviewed Today     Reviewed by Merleen Nicely, LPN (Licensed Practical Nurse) on 05/23/23 at 1049  Med List Status: <None>   Medication Order Taking? Sig Documenting Provider Last Dose Status Informant  acetaminophen (TYLENOL) 325 MG tablet [id number] Yes Take 325-650 mg by mouth every 6 (six) hours as needed for mild pain or headache.  [provider] Taking Active Self  acyclovir (ZOVIRAX) 400 MG tablet [id number] Yes Take 1 tablet (400 mg total) by mouth 4 (four) times daily as needed (as directed for outbreaks). Plotnikov, Georgina Quint, MD Taking Active Self  albuterol (VENTOLIN HFA) 108 (90 Base) MCG/ACT inhaler [id number] Yes Inhale 2 puffs into the lungs every 4 (four) hours as needed for wheezing or shortness of breath. Plotnikov, Georgina Quint, MD Taking Active Self  alprazolam Prudy Feeler) 2 MG tablet [id number] Yes Take 0.5 tablets (1 mg total) by mouth 4 (four) times daily as needed for sleep or anxiety. Plotnikov, Georgina Quint, MD Taking Active Self  amLODipine-olmesartan (AZOR) 10-40 MG tablet [id number] Yes TAKE 1 TABLET BY MOUTH ONCE  DAILY Plotnikov, Georgina Quint, MD Taking Active Self  amphetamine-dextroamphetamine (ADDERALL) 10 MG tablet [id number] Yes Take 1 tablet (10 mg total) by mouth 2 (two) times daily with breakfast and lunch. Plotnikov, Georgina Quint, MD Taking Active Self  aspirin EC 81 MG tablet [id number] Yes Take 1 tablet (81 mg total) by mouth daily. Plotnikov, Georgina Quint, MD Taking Active Self  b complex vitamins tablet [id number] Yes Take 1 tablet by mouth daily. [provider] Taking Active Self  Cholecalciferol (VITAMIN D3) 50 MCG (2000 UT) capsule [id number] Yes Take 1 capsule (2,000 Units total) by mouth daily. Plotnikov, Georgina Quint, MD Taking Active Self  cyanocobalamin (VITAMIN B12) 1000 MCG/ML injection [id number] Yes Inject 1 mL (1,000 mcg total) into the skin daily x 1 week, then once weekly for 1 month, then once every 2 weeks Plotnikov, Georgina Quint, MD Taking Active Self  diazePAM, 20 MG Dose, (VALTOCO 20 MG DOSE) 2 x 10 MG/0.1ML LQPK [id number] Yes Spray in nose as instructed for seizure. May use second dose after 4 hours. Van Clines, MD Taking Active Self  diphenhydrAMINE (BENADRYL) 25 mg capsule [id number] Yes Take 25 mg by mouth every 6 (six) hours as needed (for seasonal allergies). [provider] Taking Active Self  diphenoxylate-atropine (LOMOTIL) 2.5-0.025 MG tablet [id number] Yes TAKE 1 OR 2 TABLETS BY MOUTH 4 TIMES DAILY AS NEEDED FOR DIARRHEA OR LOOSE STOOLS MAX OF 8 TABLETS PER DAY  Patient taking differently: Take 1-2 tablets by mouth 4 (four) times daily as needed for diarrhea or loose stools (MAX OF 8 TABLETS/24 HOURS).   Plotnikov, Georgina Quint, MD Taking Active Self  Flaxseed, Judene Companion (FLAX SEEDS) POWD [id number] Yes 2 (two) times  daily as needed (as directed when mixing into health shakes). [provider] Taking Active Self  Fluticasone-Umeclidin-Vilant (TRELEGY ELLIPTA) 100-62.5-25 MCG/ACT AEPB [id number] Yes Inhale 1 puff into the lungs daily. Plotnikov, Georgina Quint, MD Taking  Active Self  glycopyrrolate (ROBINUL) 2 MG tablet [id number] Yes Take 1 tablet (2 mg total) by mouth 2 (two) times daily. Plotnikov, Georgina Quint, MD Taking Active Self  HYDROcodone-acetaminophen (NORCO) 10-325 MG tablet [id number] Yes Take 1 tablet by mouth every 6 (six) hours as needed. Plotnikov, Georgina Quint, MD Taking Active Self  lamoTRIgine (LAMICTAL) 100 MG tablet [id number] Yes Take 1 tablet (100 mg total) by mouth 2 (two) times daily. Van Clines, MD Taking Active Self  loperamide (IMODIUM) 2 MG capsule [id number] Yes Take 2 mg by mouth as needed for diarrhea or loose stools. [provider] Taking Active Self  Multiple Vitamin (MULTIVITAMIN) capsule [id number] Yes Take 1 capsule by mouth daily.  [provider] Taking Active Self           Med Note (SUTPHIN, CHASIE F   Mon Jan 22, 2019  1:35 PM)    SYRINGE-NEEDLE, DISP, 3 ML (BD ECLIPSE SYRINGE) 25G X 1" 3 ML MISC [id number] Yes Use as directed Plotnikov, Georgina Quint, MD Taking Active Self  testosterone cypionate (DEPOTESTOSTERONE CYPIONATE) 200 MG/ML injection [id number] Yes Inject 1 mL (200 mg total) into the muscle every 14 (fourteen) days. Plotnikov, Georgina Quint, MD Taking Active Self  zinc gluconate 50 MG tablet [id number] Yes Take 100 mg by mouth daily. [provider] Taking Active Self           Med Note Antony Madura, Arn Medal   Tue Jul 24, 2019  8:31 PM) 2 tablets = 100 mg            Home Care and Equipment/Supplies: Were Home Health Services Ordered?: No Any new equipment or medical supplies ordered?: No  Functional Questionnaire: Do you need assistance with bathing/showering or dressing?: No Do you need assistance with meal preparation?: No Do you need assistance with eating?: No Do you have difficulty maintaining continence: No Do you need assistance with getting out of bed/getting out of a chair/moving?: No Do you have difficulty managing or taking your medications?: No  Follow up appointments  reviewed: PCP Follow-up appointment confirmed?: No MD Provider Line Number:[phone] Given: Yes Specialist Hospital Follow-up appointment confirmed?: No Reason Specialist Follow-Up Not Confirmed: Patient has Specialist Provider Number and will Call for Appointment Do you need transportation to your follow-up appointment?: No Do you understand care options if your condition(s) worsen?: Yes-patient verbalized understanding    SIGNATURE  Woodfin Ganja LPN [facility] Nurse Health Advisor Direct Dial [phone]

## 2023-05-25 ENCOUNTER — Encounter: Payer: Self-pay | Admitting: Neurology

## 2023-05-27 ENCOUNTER — Ambulatory Visit (INDEPENDENT_AMBULATORY_CARE_PROVIDER_SITE_OTHER): Payer: Managed Care, Other (non HMO) | Admitting: Neurology

## 2023-05-27 ENCOUNTER — Encounter: Payer: Self-pay | Admitting: Neurology

## 2023-05-27 VITALS — BP 113/69 | HR 80 | Ht 67.0 in | Wt 216.6 lb

## 2023-05-27 DIAGNOSIS — R413 Other amnesia: Secondary | ICD-10-CM

## 2023-05-27 DIAGNOSIS — F4323 Adjustment disorder with mixed anxiety and depressed mood: Secondary | ICD-10-CM | POA: Diagnosis not present

## 2023-05-27 DIAGNOSIS — R569 Unspecified convulsions: Secondary | ICD-10-CM

## 2023-05-27 NOTE — Progress Notes (Signed)
NEUROLOGY FOLLOW UP OFFICE NOTE  TIMARION TRANA [id number] 1966/01/31  HISTORY OF PRESENT ILLNESS: I had the pleasure of seeing Bervin Kroninger in follow-up in the neurology clinic on 05/27/2023.  The patient was last seen 3 months ago for new onset seizures that started in November 2023. He is alone in the office today. Records and images were personally reviewed where available. Lamotrigine was added to Zonisamide on his last visit, dose increased early August due to continued report of seizures. His ambulatory EEG captured an episode where his muscles felt sore after, there was no video but the EEG did not show any changes during push button events. He was admitted to the EMU from Aug 19-23, 2024 where Lamotrigine and Zonisamide were held. Baseline EEG was normal. There was one smaller episode captured where he has left index finger twitching followed by left arm twitching/stiffening. He reports he felt the same constricting sensation in his throat prior. EEG normal during event. Zonisamide was discontinued in light of normal baseline EEG and nonepileptic event, as well as side effects on Zonisamide. He was continued on Lamotrigine 100mg  BID as he feels that it has helped him. He reports that he was doing well while admitted to the hospital, however once he got home, he had on on 8/25 lasting 2 minutes, 8/27 lasting 1-2 minutes, then 8/30 as he was getting water in the kitchen, he felt like something pulling his neck, he came to on the floor. He gets so confused afterwards with recall being very off. He is scheduled for Neurocognitive testing in January 2025.    History on Initial Assessment 09/21/2022: This is a pleasant 57 year old right-handed man with a history of hypertension, hyperthyroidism, IBS, anxiety, tremor, presenting for evaluation of new onset seizures. He denies any prior history of seizures until 08/21/22 when he had 3 seizures in one day. He was stressed out after putting his dog to  sleep. He recalls driving home and pulling into the driveway but pulled in crooked. His right leg was slamming the break and he could not let go, he knew what was going on but could not do anything. He felt like he was stuck. His friend in the car with him was able to stop the car. He was able to get out of the car and then his friend witnessed a generalized convulsion. He had shaking so bad it dented the side of his car with his arm. His friend lay him on his side, then he woke up and recalls getting up and walking around the car, then he felt like he was being choked then had another seizure. After this, he recalls getting up and going in the house then proceeded to have a third convulsion where he had urinary incontinence then vomited after. When EMS arrived, he was mildly confused with a hard time finding words. He recalls feeling very jittery, no focal weakness. He was brought to the ER where bloodwork showed a WBC of 15, creatinine 1.33. EKG NSR. Head CT no acute changes. UDS positive for opiates and THC. He is on hydrocodone for chronic jaw pain and using CBD gummies to help with sleep/anxiety. Since then, he has had at least 5 more episodes of loss of consciousness where he wakes up on the floor. Last episode was last night, he went to the mantel to fix something then woke up on the floor. There was one witnessed incident 2 weeks ago where his friend told him he was staring  and unresponsive for 5 minutes. He gets flashes in his eyes before the episodes, then when he wakes up it feels like he went through a workout at the gym. His chest muscles still hurt. Since then, he has also had spasms where what he is holding in his hand goes flying out. He loses his train of thought a lot. Since the initial seizures, he could not recall how to do things at work that he had been doing for over 6 years (works with [employer]). He feels like his brain is scrambled. He has not prior history of headaches but since the seizures  has had constant headaches with pressure on both side, 2/10 in intensity today, no nausea/vomiting. He is sensitive to lights. He has had loss of appetite. He has bilateral tinnitus, worse at night. Masking has not helped. HE is not sleeping well, getting 4-6 hours of sleep. Prior to the seizures, he was taking alprazolam around 2 times a week, but since the seizures he has had a constant nervousness and worsening hand tremors and taking 1/4-1/2 tablet daily. His PCP started Levetiracetam 500mg  BID which is making him sleepy. He denies any dizziness, diplopia, neck/back pain, bowel/bladder dysfunction. He was born 3 months premature. 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 He had a normal early development.  There is no history of febrile convulsions, CNS infections such as meningitis/encephalitis, significant traumatic brain injury, neurosurgical procedures, or family history of seizures. His sister also has tremors.  Prior ASMs: Levetiracetam, oxcarbazepine, Zonisamide  Diagnostic Data: Brain MRI with and without contrast done 09/2022 no acute changes. There is moderately prominent enlargement of the atria and occipital horns of the right greater than left lateral ventricles with associated surrounding white matter volume loss. One or two small foci of T2 FLAIR hyperintensity in the cerebral white matter are within normal limits for age.   Routine EEG 08/2022 normal  69-hour Ambulatory EEG 12/2022 normal. Push button events showed movement artifact, no epileptiform discharges seen. No video recorded.   Lumbar puncture 11/2022: mildly elevated CSF protein 56, normal cell count, glucose. Negative autoimmune panel.   PAST MEDICAL HISTORY: Past Medical History:  Diagnosis Date   Anxiety    Costochondritis    recurrent    Depression    Dr Dub Mikes   Fatigue 2009   Genital herpes 2009   GERD (gastroesophageal reflux disease)    Heat stroke    HTN (hypertension)     Hyperthyroidism    IBS (irritable bowel syndrome)    Panic attack    Prostatitis    Very painful flare-ups Dr Retta Diones   Tremor     MEDICATIONS: Current Outpatient Medications on File Prior to Visit  Medication Sig Dispense Refill   acetaminophen (TYLENOL) 325 MG tablet Take 325-650 mg by mouth every 6 (six) hours as needed for mild pain or headache.      acyclovir (ZOVIRAX) 400 MG tablet Take 1 tablet (400 mg total) by mouth 4 (four) times daily as needed (as directed for outbreaks). 60 tablet 3   albuterol (VENTOLIN HFA) 108 (90 Base) MCG/ACT inhaler Inhale 2 puffs into the lungs every 4 (four) hours as needed for wheezing or shortness of breath. 18 g 5   alprazolam (XANAX) 2 MG tablet Take 0.5 tablets (1 mg total) by mouth 4 (four) times daily as needed for sleep or anxiety. 120 tablet 1   amLODipine-olmesartan (AZOR) 10-40 MG tablet TAKE 1 TABLET BY MOUTH  ONCE DAILY 90 tablet 3   amphetamine-dextroamphetamine (ADDERALL) 10 MG tablet Take 1 tablet (10 mg total) by mouth 2 (two) times daily with breakfast and lunch. 60 tablet 0   aspirin EC 81 MG tablet Take 1 tablet (81 mg total) by mouth daily. 100 tablet 3   b complex vitamins tablet Take 1 tablet by mouth daily.     Cholecalciferol (VITAMIN D3) 50 MCG (2000 UT) capsule Take 1 capsule (2,000 Units total) by mouth daily. 100 capsule 3   cyanocobalamin (VITAMIN B12) 1000 MCG/ML injection Inject 1 mL (1,000 mcg total) into the skin daily x 1 week, then once weekly for 1 month, then once every 2 weeks 10 mL 6   diazePAM, 20 MG Dose, (VALTOCO 20 MG DOSE) 2 x 10 MG/0.1ML LQPK Spray in nose as instructed for seizure. May use second dose after 4 hours. 4 each 5   diphenhydrAMINE (BENADRYL) 25 mg capsule Take 25 mg by mouth every 6 (six) hours as needed (for seasonal allergies).     diphenoxylate-atropine (LOMOTIL) 2.5-0.025 MG tablet TAKE 1 OR 2 TABLETS BY MOUTH 4 TIMES DAILY AS NEEDED FOR DIARRHEA OR LOOSE STOOLS MAX OF 8 TABLETS PER DAY  (Patient taking differently: Take 1-2 tablets by mouth 4 (four) times daily as needed for diarrhea or loose stools (MAX OF 8 TABLETS/24 HOURS).) 60 tablet 3   Flaxseed, Linseed, (FLAX SEEDS) POWD 2 (two) times daily as needed (as directed when mixing into health shakes).     Fluticasone-Umeclidin-Vilant (TRELEGY ELLIPTA) 100-62.5-25 MCG/ACT AEPB Inhale 1 puff into the lungs daily. 60 each 11   glycopyrrolate (ROBINUL) 2 MG tablet Take 1 tablet (2 mg total) by mouth 2 (two) times daily. 180 tablet 0   HYDROcodone-acetaminophen (NORCO) 10-325 MG tablet Take 1 tablet by mouth every 6 (six) hours as needed. 120 tablet 0   lamoTRIgine (LAMICTAL) 100 MG tablet Take 1 tablet (100 mg total) by mouth 2 (two) times daily. 60 tablet 5   loperamide (IMODIUM) 2 MG capsule Take 2 mg by mouth as needed for diarrhea or loose stools.     Multiple Vitamin (MULTIVITAMIN) capsule Take 1 capsule by mouth daily.      Oyster Shell (OYSTER CALCIUM) 500 MG TABS tablet Take 500 mg of elemental calcium by mouth daily.     SYRINGE-NEEDLE, DISP, 3 ML (BD ECLIPSE SYRINGE) 25G X 1" 3 ML MISC Use as directed 50 each 3   tadalafil (CIALIS) 5 MG tablet Take 5 mg by mouth daily.     testosterone cypionate (DEPOTESTOSTERONE CYPIONATE) 200 MG/ML injection Inject 1 mL (200 mg total) into the muscle every 14 (fourteen) days. 10 mL 5   zinc gluconate 50 MG tablet Take 100 mg by mouth daily.     No current facility-administered medications on file prior to visit.    ALLERGIES: Allergies  Allergen Reactions   Buspirone Hcl Other (See Comments)    Reaction not recalled by the patient   Divalproex Sodium Other (See Comments)    Severe headaches   Gluten Meal Other (See Comments)   Olanzapine-Fluoxetine Hcl Other (See Comments)    Reaction not recalled by the patient   Other Other (See Comments)   Piroxicam Other (See Comments)    Reaction not recalled by the patient   Wheat Diarrhea and Other (See Comments)    "Tears up my  stomach"   Ibuprofen Rash and Other (See Comments)    Spots appear on chest appear    FAMILY HISTORY:  Family History  Problem Relation Age of Onset   Hyperlipidemia Other    Coronary artery disease Other    Stroke Mother    Colon cancer Paternal Grandmother    Diabetes Other    Breast cancer Other    Pancreatic cancer Other    Cancer Other        breast, pancreatic   Heart disease Other    Cancer Maternal Grandmother        colon    SOCIAL HISTORY: Social History   Socioeconomic History   Marital status: Single    Spouse name: Not on file   Number of children: Not on file   Years of education: Not on file   Highest education level: Not on file  Occupational History   Occupation: Teacher, adult education: [employer]  Tobacco Use   Smoking status: Never   Smokeless tobacco: Never  Vaping Use   Vaping status: Never Used  Substance and Sexual Activity   Alcohol use: Not Currently    Comment: 2-6 wine every 2/wks   Drug use: Yes    Types: Benzodiazepines, Amphetamines    Comment: As prescribed   Sexual activity: Yes  Other Topics Concern   Not on file  Social History Narrative   Are you right handed or left handed? right   Are you currently employed ? 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 no   Who lives with you? Room mate   What type of home do you live in: 1 story or 2 story? 1.5 story        Social Determinants of Health   Financial Resource Strain: Not on file  Food Insecurity: No Food Insecurity (05/16/2023)   Hunger Vital Sign    Worried About Running Out of Food in the Last Year: Never true    Ran Out of Food in the Last Year: Never true  Transportation Needs: No Transportation Needs (05/16/2023)   PRAPARE - Administrator, Civil Service (Medical): No    Lack of Transportation (Non-Medical): No  Physical Activity: Not on file  Stress: Not on file  Social Connections: Not on file  Intimate  Partner Violence: Not At Risk (05/16/2023)   Humiliation, Afraid, Rape, and Kick questionnaire    Fear of Current or Ex-Partner: No    Emotionally Abused: No    Physically Abused: No    Sexually Abused: No     PHYSICAL EXAM: Vitals:   05/27/23 0938  BP: 113/69  Pulse: 80  SpO2: 99%   General: No acute distress, tearful Head:  Normocephalic/atraumatic Skin/Extremities: No rash, no edema Neurological Exam: alert and awake. No aphasia or dysarthria. Fund of knowledge is appropriate.  Attention and concentration are normal.   Cranial nerves: Pupils equal, round. Extraocular movements intact..  No facial asymmetry.  Motor: moves all extremities symmetrically. No resting tremor. There is an action tremor more noticeable on the right hand with movements. Gait narrow-based and steady, no ataxia.    IMPRESSION: This is a pleasant 57 yo RH man with a history of hypertension, hyperthyroidism, IBS, anxiety, tremor, with new onset seizures since 08/21/2022. He has nocturnal convulsions and staring episodes. MRI brain no acute changes however there is note of moderate prominent enlargement of the atria and occipital horns of the right greater than left lateral ventricles, hippocampi symmetric. His ambulatory EEG captured seizures with no EEG correlate, then  he was admitted to the EMU for 5 days where medications were held and he had a smaller episode of throat constriction, left arm stiffening/twitching that  did not show EEG correlate. Bigger events were not captured and he was discharged home on Lamotrigine 100mg  BID. 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 He is understandably affected by his current condition, I discussed referral to Dr. Adrian Blackwater with Psychiatry and Cognitive Behavioral Therapy. For cognitive changes (likely due to underlying depression), he will be referred to Speech therapy for cognitive therapy and he is scheduled for  Neurocognitive testing in January 2025. We discussed difficulties with functioning at work and short-term leave until August 28, 2023 (tentative return to work 08/29/23 if able). He is aware of [place] driving laws to stop driving after an episode of loss of consciousness until 6 months event-free. Follow-up in 2 months, call for any changes.    Thank you for allowing me to participate in his care.  Please do not hesitate to call for any questions or concerns.    Patrcia Dolly, M.D.   CC: Dr. Posey Rea

## 2023-05-27 NOTE — Patient Instructions (Addendum)
Good to see you.  Continue Lamotrigine 100mg  twice a day  2. Referral will be sent to Dr. Adrian Blackwater with Psychiatry  3. Please also contact Laroy Apple for CBT for non-epileptic events [phone] [email or web address]  4. Proceed with Neurocognitive testing as scheduled  5. Please try to take a video of the seizures when able  6. Here are helpful websites:  [email or web address]  [email or web address]  7. Follow-up in 2 months, call for any changes

## 2023-06-01 ENCOUNTER — Telehealth: Payer: Self-pay | Admitting: Neurology

## 2023-06-01 NOTE — Telephone Encounter (Signed)
Pt is calling in stating that our office rec'd by fax [employer] disability pw that needs to be back to them by 06/06/2023. Pt has not paid for the pw and stated he would pay after it has been completed it what he was told will happen. Pt also stated that he does not get paid if the pw has not been sent in by 06/06/2023.  Pt would like to have a call back once it has been faxed in.

## 2023-06-02 ENCOUNTER — Encounter: Payer: Self-pay | Admitting: Neurology

## 2023-06-02 DIAGNOSIS — Z0279 Encounter for issue of other medical certificate: Secondary | ICD-10-CM

## 2023-06-02 NOTE — Telephone Encounter (Signed)
Done, thanks

## 2023-06-02 NOTE — Telephone Encounter (Signed)
Called the pt to let him know that his disability forms were ready for pick up and that we need to collect the $25.00 form fee before we are able to fax it to the needed area.

## 2023-06-02 NOTE — Telephone Encounter (Signed)
Paperwork has been faxed to [phone] policy number [id number]

## 2023-06-06 ENCOUNTER — Other Ambulatory Visit: Payer: Self-pay

## 2023-06-06 ENCOUNTER — Encounter: Payer: Self-pay | Admitting: Internal Medicine

## 2023-06-06 ENCOUNTER — Ambulatory Visit (INDEPENDENT_AMBULATORY_CARE_PROVIDER_SITE_OTHER): Payer: Managed Care, Other (non HMO) | Admitting: Internal Medicine

## 2023-06-06 ENCOUNTER — Other Ambulatory Visit (HOSPITAL_COMMUNITY): Payer: Self-pay

## 2023-06-06 VITALS — BP 110/68 | HR 73 | Temp 98.6°F | Ht 67.0 in | Wt 218.0 lb

## 2023-06-06 DIAGNOSIS — F331 Major depressive disorder, recurrent, moderate: Secondary | ICD-10-CM | POA: Diagnosis not present

## 2023-06-06 DIAGNOSIS — F4323 Adjustment disorder with mixed anxiety and depressed mood: Secondary | ICD-10-CM | POA: Diagnosis not present

## 2023-06-06 DIAGNOSIS — F32A Depression, unspecified: Secondary | ICD-10-CM | POA: Insufficient documentation

## 2023-06-06 DIAGNOSIS — K580 Irritable bowel syndrome with diarrhea: Secondary | ICD-10-CM

## 2023-06-06 DIAGNOSIS — E559 Vitamin D deficiency, unspecified: Secondary | ICD-10-CM | POA: Diagnosis not present

## 2023-06-06 DIAGNOSIS — R569 Unspecified convulsions: Secondary | ICD-10-CM

## 2023-06-06 MED ORDER — GLYCOPYRROLATE 2 MG PO TABS
2.0000 mg | ORAL_TABLET | Freq: Two times a day (BID) | ORAL | 3 refills | Status: DC
  Filled 2023-06-06: qty 180, 90d supply, fill #0
  Filled 2023-09-02: qty 180, 90d supply, fill #1
  Filled 2023-10-10 – 2024-01-24 (×3): qty 180, 90d supply, fill #2

## 2023-06-06 MED ORDER — VITAMIN D3 50 MCG (2000 UT) PO CAPS
2000.0000 [IU] | ORAL_CAPSULE | Freq: Every day | ORAL | 3 refills | Status: DC
  Filled 2023-06-06: qty 100, 100d supply, fill #0
  Filled 2023-11-07: qty 100, 100d supply, fill #1
  Filled 2024-03-19: qty 100, 100d supply, fill #2

## 2023-06-06 NOTE — Assessment & Plan Note (Addendum)
Dr Adrian Blackwater Off work till December 2024 Off work till December Per Dr Karel Jarvis: " 57 yo RH man with a history of hypertension, hyperthyroidism, IBS, anxiety, tremor, with new onset seizures since 08/21/2022. He has nocturnal convulsions and staring episodes. MRI brain no acute changes however there is note of moderate prominent enlargement of the atria and occipital horns of the right greater than left lateral ventricles, hippocampi symmetric. His ambulatory EEG captured seizures with no EEG correlate, then he was admitted to the EMU for 5 days where medications were held and he had a smaller episode of throat constriction, left arm stiffening/twitching that  did not show EEG correlate. Bigger events were not captured and he was discharged home on Lamotrigine 100mg  BID. 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 He is understandably affected by his current condition, I discussed referral to Dr. Adrian Blackwater with Psychiatry and Cognitive Behavioral Therapy. For cognitive changes (likely due to underlying depression), he will be referred to Speech therapy for cognitive therapy and he is scheduled for Neurocognitive testing in January 2025. We discussed difficulties with functioning at work and short-term leave until August 28, 2023 (tentative return to work 08/29/23 if able). He is aware of [place] driving laws to stop driving after an episode of loss of consciousness until 6 months event-free. Follow-up in 2 months, call for any changes. "

## 2023-06-06 NOTE — Assessment & Plan Note (Signed)
Discussed Off work till December Per Dr Karel Jarvis: " 57 yo RH man with a history of hypertension, hyperthyroidism, IBS, anxiety, tremor, with new onset seizures since 08/21/2022. He has nocturnal convulsions and staring episodes. MRI brain no acute changes however there is note of moderate prominent enlargement of the atria and occipital horns of the right greater than left lateral ventricles, hippocampi symmetric. His ambulatory EEG captured seizures with no EEG correlate, then he was admitted to the EMU for 5 days where medications were held and he had a smaller episode of throat constriction, left arm stiffening/twitching that  did not show EEG correlate. Bigger events were not captured and he was discharged home on Lamotrigine 100mg  BID. 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 He is understandably affected by his current condition, I discussed referral to Dr. Adrian Blackwater with Psychiatry and Cognitive Behavioral Therapy. For cognitive changes (likely due to underlying depression), he will be referred to Speech therapy for cognitive therapy and he is scheduled for Neurocognitive testing in January 2025. We discussed difficulties with functioning at work and short-term leave until August 28, 2023 (tentative return to work 08/29/23 if able). He is aware of  driving laws to stop driving after an episode of loss of consciousness until 6 months event-free. Follow-up in 2 months, call for any changes. "

## 2023-06-06 NOTE — Assessment & Plan Note (Signed)
  Per Dr Karel Jarvis: " 57 yo RH man with a history of hypertension, hyperthyroidism, IBS, anxiety, tremor, with new onset seizures since 08/21/2022. He has nocturnal convulsions and staring episodes. MRI brain no acute changes however there is note of moderate prominent enlargement of the atria and occipital horns of the right greater than left lateral ventricles, hippocampi symmetric. His ambulatory EEG captured seizures with no EEG correlate, then he was admitted to the EMU for 5 days where medications were held and he had a smaller episode of throat constriction, left arm stiffening/twitching that  did not show EEG correlate. Bigger events were not captured and he was discharged home on Lamotrigine 100mg  BID. 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 He is understandably affected by his current condition, I discussed referral to Dr. Adrian Blackwater with Psychiatry and Cognitive Behavioral Therapy. For cognitive changes (likely due to underlying depression), he will be referred to Speech therapy for cognitive therapy and he is scheduled for Neurocognitive testing in January 2025. We discussed difficulties with functioning at work and short-term leave until August 28, 2023 (tentative return to work 08/29/23 if able). He is aware of [place] driving laws to stop driving after an episode of loss of consciousness until 6 months event-free. Follow-up in 2 months, call for any changes. "

## 2023-06-06 NOTE — Assessment & Plan Note (Signed)
On Vit D 

## 2023-06-06 NOTE — Progress Notes (Signed)
Subjective:  Patient ID: Mark Mcgee, male    DOB: 1966-01-23  Age: 57 y.o. MRN: [id number]  CC: Follow-up (3 mnth f/u)   HPI GALILEO SIECK presents for seizure - on Lamictal  Outpatient Medications Prior to Visit  Medication Sig Dispense Refill   acetaminophen (TYLENOL) 325 MG tablet Take 325-650 mg by mouth every 6 (six) hours as needed for mild pain or headache.      acyclovir (ZOVIRAX) 400 MG tablet Take 1 tablet (400 mg total) by mouth 4 (four) times daily as needed (as directed for outbreaks). 60 tablet 3   albuterol (VENTOLIN HFA) 108 (90 Base) MCG/ACT inhaler Inhale 2 puffs into the lungs every 4 (four) hours as needed for wheezing or shortness of breath. 18 g 5   alprazolam (XANAX) 2 MG tablet Take 0.5 tablets (1 mg total) by mouth 4 (four) times daily as needed for sleep or anxiety. 120 tablet 1   amLODipine-olmesartan (AZOR) 10-40 MG tablet TAKE 1 TABLET BY MOUTH ONCE DAILY 90 tablet 3   amphetamine-dextroamphetamine (ADDERALL) 10 MG tablet Take 1 tablet (10 mg total) by mouth 2 (two) times daily with breakfast and lunch. 60 tablet 0   aspirin EC 81 MG tablet Take 1 tablet (81 mg total) by mouth daily. 100 tablet 3   b complex vitamins tablet Take 1 tablet by mouth daily.     cyanocobalamin (VITAMIN B12) 1000 MCG/ML injection Inject 1 mL (1,000 mcg total) into the skin daily x 1 week, then once weekly for 1 month, then once every 2 weeks 10 mL 6   diazePAM, 20 MG Dose, (VALTOCO 20 MG DOSE) 2 x 10 MG/0.1ML LQPK Spray in nose as instructed for seizure. May use second dose after 4 hours. 4 each 5   diphenhydrAMINE (BENADRYL) 25 mg capsule Take 25 mg by mouth every 6 (six) hours as needed (for seasonal allergies).     diphenoxylate-atropine (LOMOTIL) 2.5-0.025 MG tablet TAKE 1 OR 2 TABLETS BY MOUTH 4 TIMES DAILY AS NEEDED FOR DIARRHEA OR LOOSE STOOLS MAX OF 8 TABLETS PER DAY (Patient taking differently: Take 1-2 tablets by mouth 4 (four) times daily as needed for diarrhea or  loose stools (MAX OF 8 TABLETS/24 HOURS).) 60 tablet 3   Flaxseed, Linseed, (FLAX SEEDS) POWD 2 (two) times daily as needed (as directed when mixing into health shakes).     Fluticasone-Umeclidin-Vilant (TRELEGY ELLIPTA) 100-62.5-25 MCG/ACT AEPB Inhale 1 puff into the lungs daily. 60 each 11   glycopyrrolate (ROBINUL) 2 MG tablet Take 1 tablet (2 mg total) by mouth 2 (two) times daily. 180 tablet 0   HYDROcodone-acetaminophen (NORCO) 10-325 MG tablet Take 1 tablet by mouth every 6 (six) hours as needed. 120 tablet 0   lamoTRIgine (LAMICTAL) 100 MG tablet Take 1 tablet (100 mg total) by mouth 2 (two) times daily. 60 tablet 5   loperamide (IMODIUM) 2 MG capsule Take 2 mg by mouth as needed for diarrhea or loose stools.     Multiple Vitamin (MULTIVITAMIN) capsule Take 1 capsule by mouth daily.      Oyster Shell (OYSTER CALCIUM) 500 MG TABS tablet Take 500 mg of elemental calcium by mouth daily.     SYRINGE-NEEDLE, DISP, 3 ML (BD ECLIPSE SYRINGE) 25G X 1" 3 ML MISC Use as directed 50 each 3   tadalafil (CIALIS) 5 MG tablet Take 5 mg by mouth daily.     testosterone cypionate (DEPOTESTOSTERONE CYPIONATE) 200 MG/ML injection Inject 1 mL (200 mg total) into  the muscle every 14 (fourteen) days. 10 mL 5   zinc gluconate 50 MG tablet Take 100 mg by mouth daily.     Cholecalciferol (VITAMIN D3) 50 MCG (2000 UT) capsule Take 1 capsule (2,000 Units total) by mouth daily. 100 capsule 3   No facility-administered medications prior to visit.    ROS: Review of Systems  Constitutional:  Positive for fatigue. Negative for appetite change and unexpected weight change.  HENT:  Negative for congestion, nosebleeds, sneezing, sore throat and trouble swallowing.   Eyes:  Negative for itching and visual disturbance.  Respiratory:  Negative for cough.   Cardiovascular:  Negative for chest pain, palpitations and leg swelling.  Gastrointestinal:  Positive for abdominal pain and diarrhea. Negative for abdominal  distention, blood in stool and nausea.  Genitourinary:  Negative for frequency and hematuria.  Musculoskeletal:  Negative for back pain, gait problem, joint swelling and neck pain.  Skin:  Negative for rash.  Neurological:  Positive for seizures and weakness. Negative for dizziness, tremors and speech difficulty.  Hematological:  Does not bruise/bleed easily.  Psychiatric/Behavioral:  Positive for decreased concentration, dysphoric mood and hallucinations. Negative for agitation, behavioral problems, self-injury, sleep disturbance and suicidal ideas. The patient is nervous/anxious.     Objective:  BP 110/68 (BP Location: Left Arm, Patient Position: Sitting, Cuff Size: Large)   Pulse 73   Temp 98.6 F (37 C) (Oral)   Ht 5\' 7"  (1.702 m)   Wt 218 lb (98.9 kg)   SpO2 96%   BMI 34.14 kg/m   BP Readings from Last 3 Encounters:  06/06/23 110/68  05/27/23 113/69  05/20/23 111/71    Wt Readings from Last 3 Encounters:  06/06/23 218 lb (98.9 kg)  05/27/23 216 lb 9.6 oz (98.2 kg)  05/16/23 225 lb 15.5 oz (102.5 kg)    Physical Exam Constitutional:      General: He is not in acute distress.    Appearance: He is well-developed.     Comments: NAD  Eyes:     Conjunctiva/sclera: Conjunctivae normal.     Pupils: Pupils are equal, round, and reactive to light.  Neck:     Thyroid: No thyromegaly.     Vascular: No JVD.  Cardiovascular:     Rate and Rhythm: Normal rate and regular rhythm.     Heart sounds: Normal heart sounds. No murmur heard.    No friction rub. No gallop.  Pulmonary:     Effort: Pulmonary effort is normal. No respiratory distress.     Breath sounds: Normal breath sounds. No wheezing or rales.  Chest:     Chest wall: No tenderness.  Abdominal:     General: Bowel sounds are normal. There is no distension.     Palpations: Abdomen is soft. There is no mass.     Tenderness: There is no abdominal tenderness. There is no guarding or rebound.  Musculoskeletal:         General: No tenderness. Normal range of motion.     Cervical back: Normal range of motion.  Lymphadenopathy:     Cervical: No cervical adenopathy.  Skin:    General: Skin is warm and dry.     Findings: No rash.  Neurological:     Mental Status: He is alert and oriented to person, place, and time.     Cranial Nerves: No cranial nerve deficit.     Motor: No abnormal muscle tone.     Coordination: Coordination normal.     Gait: Gait  normal.     Deep Tendon Reflexes: Reflexes are normal and symmetric.  Psychiatric:        Behavior: Behavior normal.        Thought Content: Thought content normal.        Judgment: Judgment normal.     Lab Results  Component Value Date   WBC 7.7 05/16/2023   HGB 11.8 (L) 05/16/2023   HCT 37.8 (L) 05/16/2023   PLT 249 05/16/2023   GLUCOSE 96 05/16/2023   CHOL 172 03/01/2023   TRIG 127.0 03/01/2023   HDL 46.80 03/01/2023   LDLDIRECT 91.0 12/04/2019   LDLCALC 99 03/01/2023   ALT 21 05/16/2023   AST 16 05/16/2023   NA 138 05/16/2023   K 4.2 05/16/2023   CL 106 05/16/2023   CREATININE 1.52 (H) 05/16/2023   BUN 21 (H) 05/16/2023   CO2 24 05/16/2023   TSH 1.78 03/01/2023   PSA 0.65 03/01/2023   INR 0.9 05/16/2023    Overnight EEG with video  Result Date: 05/17/2023 Charlsie Quest, MD     05/17/2023 12:04 PM Patient Name: JREAM MCQUERRY MRN: [id number] Epilepsy Attending: Charlsie Quest Referring Physician/Provider: Charlsie Quest, MD Duration: 05/16/2023 9528 to 05/17/2023 4132 Patient history: 57 year old male with seizure-like episodes getting eeg for  characterization of spells. Level of alertness: Awake, asleep AEDs during EEG study: LTG, ZNS Technical aspects: This EEG study was done with scalp electrodes positioned according to the 10-20 International system of electrode placement. Electrical activity was reviewed with band pass filter of 1-70Hz , sensitivity of 7 uV/mm, display speed of 49mm/sec with a 60Hz  notched filter applied as  appropriate. EEG data were recorded continuously and digitally stored.  Video monitoring was available and reviewed as appropriate. Description: The posterior dominant rhythm consists of 9-10 Hz activity of moderate voltage (25-35 uV) seen predominantly in posterior head regions, symmetric and reactive to eye opening and eye closing. Sleep was characterized by vertex waves, sleep spindles (12 to 14 Hz), maximal frontocentral region.  Event button was pressed on 05/17/2023 at 0254.  Patient was laying in bed.  Then had left index finger twitching followed by twitching of left arm.  Patient was in right lateral position facing away from camera therefore difficult to visualize but eyes did appear to be closed. Concomitant EEG before, during and after the event showed normal posterior dominant rhythm and did not show any EEG changes suggest seizure. Hyperventilation and photic stimulation were not performed.   IMPRESSION: This study is within normal limits. No seizures or epileptiform discharges were seen throughout the recording. Event button was pressed on 05/17/2023 at 0254 for event described as above without concomitant EEG change. This was a nonepileptic event. A normal interictal EEG does not exclude nor support the diagnosis of epilepsy. Priyanka [name]    Assessment & Plan:   Problem List Items Addressed This Visit     Adjustment disorder with mixed anxiety and depressed mood - Primary     Per Dr Karel Jarvis: " 57 yo RH man with a history of hypertension, hyperthyroidism, IBS, anxiety, tremor, with new onset seizures since 08/21/2022. He has nocturnal convulsions and staring episodes. MRI brain no acute changes however there is note of moderate prominent enlargement of the atria and occipital horns of the right greater than left lateral ventricles, hippocampi symmetric. His ambulatory EEG captured seizures with no EEG correlate, then he was admitted to the EMU for 5 days where medications were held and he  had  a smaller episode of throat constriction, left arm stiffening/twitching that  did not show EEG correlate. Bigger events were not captured and he was discharged home on Lamotrigine 100mg  BID. 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 He is understandably affected by his current condition, I discussed referral to Dr. Adrian Blackwater with Psychiatry and Cognitive Behavioral Therapy. For cognitive changes (likely due to underlying depression), he will be referred to Speech therapy for cognitive therapy and he is scheduled for Neurocognitive testing in January 2025. We discussed difficulties with functioning at work and short-term leave until August 28, 2023 (tentative return to work 08/29/23 if able). He is aware of [place] driving laws to stop driving after an episode of loss of consciousness until 6 months event-free. Follow-up in 2 months, call for any changes. "      IBS (irritable bowel syndrome)    Off work till Dec 2024 Relapsing explosive diarrhea. Gaylard is unable to travel [employer], work accommodations needed On gluten and milk free diet  IBS-D      Relevant Medications   glycopyrrolate (ROBINUL) 2 MG tablet   Vitamin D deficiency    On Vit D      Seizure (HCC)    Discussed Off work till December Per Dr Karel Jarvis: " 57 yo RH man with a history of hypertension, hyperthyroidism, IBS, anxiety, tremor, with new onset seizures since 08/21/2022. He has nocturnal convulsions and staring episodes. MRI brain no acute changes however there is note of moderate prominent enlargement of the atria and occipital horns of the right greater than left lateral ventricles, hippocampi symmetric. His ambulatory EEG captured seizures with no EEG correlate, then he was admitted to the EMU for 5 days where medications were held and he had a smaller episode of throat constriction, left arm stiffening/twitching that  did not show EEG correlate. Bigger events were not  captured and he was discharged home on Lamotrigine 100mg  BID. 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 He is understandably affected by his current condition, I discussed referral to Dr. Adrian Blackwater with Psychiatry and Cognitive Behavioral Therapy. For cognitive changes (likely due to underlying depression), he will be referred to Speech therapy for cognitive therapy and he is scheduled for Neurocognitive testing in January 2025. We discussed difficulties with functioning at work and short-term leave until August 28, 2023 (tentative return to work 08/29/23 if able). He is aware of [place] driving laws to stop driving after an episode of loss of consciousness until 6 months event-free. Follow-up in 2 months, call for any changes. "      Depression    Dr Adrian Blackwater Off work till December 2024 Off work till December Per Dr Karel Jarvis: " 57 yo RH man with a history of hypertension, hyperthyroidism, IBS, anxiety, tremor, with new onset seizures since 08/21/2022. He has nocturnal convulsions and staring episodes. MRI brain no acute changes however there is note of moderate prominent enlargement of the atria and occipital horns of the right greater than left lateral ventricles, hippocampi symmetric. His ambulatory EEG captured seizures with no EEG correlate, then he was admitted to the EMU for 5 days where medications were held and he had a smaller episode of throat constriction, left arm stiffening/twitching that  did not show EEG correlate. Bigger events were not captured and he was discharged home on Lamotrigine 100mg  BID. 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 He is understandably affected by his current  condition, I discussed referral to Dr. Adrian Blackwater with Psychiatry and Cognitive Behavioral Therapy. For cognitive changes (likely due to underlying depression), he will be referred to Speech therapy for  cognitive therapy and he is scheduled for Neurocognitive testing in January 2025. We discussed difficulties with functioning at work and short-term leave until August 28, 2023 (tentative return to work 08/29/23 if able). He is aware of [place] driving laws to stop driving after an episode of loss of consciousness until 6 months event-free. Follow-up in 2 months, call for any changes. "         Meds ordered this encounter  Medications   glycopyrrolate (ROBINUL) 2 MG tablet    Sig: Take 1 tablet (2 mg total) by mouth 2 (two) times daily.    Dispense:  180 tablet    Refill:  3   Cholecalciferol (VITAMIN D3) 50 MCG (2000 UT) capsule    Sig: Take 1 capsule (2,000 Units total) by mouth daily.    Dispense:  100 capsule    Refill:  3      Follow-up: Return in about 3 months (around 09/05/2023) for a follow-up visit.  Sonda Primes, MD

## 2023-06-06 NOTE — Assessment & Plan Note (Signed)
Off work till Dec 2024 Relapsing explosive diarrhea. Mark Mcgee is unable to travel [employer], work accommodations needed On gluten and milk free diet  IBS-D

## 2023-06-07 ENCOUNTER — Telehealth: Payer: Self-pay | Admitting: Neurology

## 2023-06-07 ENCOUNTER — Other Ambulatory Visit: Payer: Self-pay | Admitting: Internal Medicine

## 2023-06-07 NOTE — Telephone Encounter (Signed)
It called informed that we did not get any more paperwork from [employer] he stated he would call and have them re fax it

## 2023-06-07 NOTE — Telephone Encounter (Signed)
Pt called in stating they received the FMLA paperwork, but they have sent more paperwork requesting additional information. He would like to double check that we received the additional paperwork?

## 2023-06-08 ENCOUNTER — Other Ambulatory Visit (HOSPITAL_COMMUNITY): Payer: Self-pay

## 2023-06-08 ENCOUNTER — Other Ambulatory Visit: Payer: Self-pay

## 2023-06-08 MED ORDER — AMLODIPINE-OLMESARTAN 10-40 MG PO TABS
1.0000 | ORAL_TABLET | Freq: Every day | ORAL | 3 refills | Status: DC
  Filled 2023-06-08: qty 90, 90d supply, fill #0
  Filled 2023-09-02: qty 90, 90d supply, fill #1
  Filled 2024-01-04 – 2024-01-24 (×2): qty 90, 90d supply, fill #2
  Filled 2024-03-19 – 2024-04-20 (×2): qty 90, 90d supply, fill #3

## 2023-06-09 ENCOUNTER — Other Ambulatory Visit: Payer: Self-pay

## 2023-06-09 ENCOUNTER — Other Ambulatory Visit (HOSPITAL_COMMUNITY): Payer: Self-pay

## 2023-06-09 MED ORDER — AMPHETAMINE-DEXTROAMPHETAMINE 10 MG PO TABS
10.0000 mg | ORAL_TABLET | Freq: Two times a day (BID) | ORAL | 0 refills | Status: DC
  Filled 2023-06-09: qty 60, 30d supply, fill #0

## 2023-06-09 MED ORDER — HYDROCODONE-ACETAMINOPHEN 10-325 MG PO TABS
1.0000 | ORAL_TABLET | Freq: Four times a day (QID) | ORAL | 0 refills | Status: DC | PRN
  Filled 2023-06-09: qty 120, 30d supply, fill #0

## 2023-06-10 ENCOUNTER — Other Ambulatory Visit: Payer: Self-pay

## 2023-06-13 ENCOUNTER — Encounter: Payer: Self-pay | Admitting: Neurology

## 2023-06-24 MED ORDER — LAMOTRIGINE 200 MG PO TABS
200.0000 mg | ORAL_TABLET | Freq: Two times a day (BID) | ORAL | 3 refills | Status: DC
  Filled 2023-06-24: qty 180, 90d supply, fill #0

## 2023-06-25 ENCOUNTER — Other Ambulatory Visit (HOSPITAL_COMMUNITY): Payer: Self-pay

## 2023-07-07 ENCOUNTER — Telehealth (HOSPITAL_COMMUNITY): Payer: Self-pay

## 2023-07-07 NOTE — Telephone Encounter (Signed)
Lvm to confirm 07/11/23 appt by 12:00 tomorrow

## 2023-07-08 NOTE — Telephone Encounter (Signed)
Appt confirmed

## 2023-07-11 ENCOUNTER — Ambulatory Visit (INDEPENDENT_AMBULATORY_CARE_PROVIDER_SITE_OTHER): Payer: 59 | Admitting: Psychiatry

## 2023-07-11 ENCOUNTER — Encounter (HOSPITAL_COMMUNITY): Payer: Self-pay | Admitting: Psychiatry

## 2023-07-11 DIAGNOSIS — F445 Conversion disorder with seizures or convulsions: Secondary | ICD-10-CM

## 2023-07-11 DIAGNOSIS — F410 Panic disorder [episodic paroxysmal anxiety] without agoraphobia: Secondary | ICD-10-CM

## 2023-07-11 DIAGNOSIS — F411 Generalized anxiety disorder: Secondary | ICD-10-CM | POA: Diagnosis not present

## 2023-07-11 DIAGNOSIS — E559 Vitamin D deficiency, unspecified: Secondary | ICD-10-CM

## 2023-07-11 DIAGNOSIS — E291 Testicular hypofunction: Secondary | ICD-10-CM | POA: Diagnosis not present

## 2023-07-11 DIAGNOSIS — E538 Deficiency of other specified B group vitamins: Secondary | ICD-10-CM | POA: Diagnosis not present

## 2023-07-11 DIAGNOSIS — D508 Other iron deficiency anemias: Secondary | ICD-10-CM | POA: Diagnosis not present

## 2023-07-11 DIAGNOSIS — K580 Irritable bowel syndrome with diarrhea: Secondary | ICD-10-CM

## 2023-07-11 NOTE — Progress Notes (Signed)
Psychiatric Initial Adult Assessment  Patient Identification: Mark Mcgee MRN:  [id number] Date of Evaluation:  07/11/2023 Referral Source: Neurology  Assessment:  Mark Mcgee is a 57 y.o. 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  Patient reported seizure onset after Thanksgiving and 2023 around which time he and his partner had to put their dog down.  His mother suffered a stroke and went on to develop a dementia for which he contributed significantly to her caregiving.  It has put him in odds with his 2 siblings who were fairly noncontributory to experience a fair amount of stress.  Ambulatory and EMU readings of seizure events did not show epileptiform correlation and was felt to be consistent with psychogenic nonepileptic seizures.  He does have a history of cold sores on his lips but lumbar puncture and workup for other etiologies of seizure ultimately unrevealing.  Was noted to have enlargement of the atria and occipital horns of the right ventricles and the hippocampi were noted to be symmetric.  He quit using CBD oil but U-Tox was positive for [facility] as recently as August 2024 and could have been contributing.  Exogenous testosterone can worsen anxiety. Patient had some complicated psychosocial history with the death of his stepfather before the end of high school and those listed above with not much discussion around birth father.  There was  likely further developmental trauma but with outside the scope of initial psychiatric eval and patient did not qualify for PTSD at time of initial assessment.  His worry was across multiple topics for greater than 6 months with racing thoughts impacting ability to sleep at night and staying fairly tense consistent with a generalized anxiety disorder with panic attacks due to semifrequent panic episodes.  He had been taking 1 mg of Xanax every other week or so and encouraged him to discontinue this especially in light of his mother's dementia diagnosis and overall risks of chronic benzodiazepine use.  Similarly, he carries a diagnosis of ADD but has not been tested in some time and would suspect contribution of malnutrition combined with insomnia to be contributing to inability to focus at times.  Encouraged him to stop taking the Adderall given his psychogenic episodes as this was likely increasing his anxiety.  This should also help with appetite and eating 1 meal per day on a somewhat random basis but most frequently around dinnertime.  He watches what he eats and unclear if there are other eating cognitions around this as he does report consistent diarrhea and abdominal pain.  Recommended he coordinate with PCP for nutrition referral for more consistent meals.  He is already on vitamin D and B12 supplements along with the B1 supplement and multivitamin.  His insomnia was multifactorial with racing thoughts but also snoring and restless legs.  The latter of which likely due to an iron deficiency based on his microcytic anemia and recommended he coordinate with PCP for sleep study.  He was not interested in adding a new medication to his regimen today but in the future if he was amenable to this he is able to schedule follow-up  with our clinic or could just start Remeron which would address the anxiety, insomnia, poor appetite.  With coming off of the Adderall would suspect that lamotrigine dose could likely be  tapered.  If needing something for focus after neuropsychiatric testing which will take place in January 2025 would consider nonstimulant medication that is also not Wellbutrin so more along the lines of Strattera.  Another possible contribution to some of the memory difficulties would be the anticholinergic burden from his IBS medications.  He will call his insurer to find a therapist in network if desiring psychotherapy.  For safety, his acute risk factors for suicide are: Out of work due to seizure-like episodes, familial discord, mother's dementia diagnosis.  His chronic risk factors are: Access to firearms, chronic benzodiazepine use, chronic pain.  His protective factors are: Employment, guns are secured and safe when not in use, actively seeking and engaging with mental health care, no suicidal ideation in session today, hope for the future, supportive partner.  All future events cannot be fully predicted he does not currently meet IVC criteria and can be continued as an outpatient.  Plan:  # Functional neurologic disorder seizure type  generalized anxiety disorder with panic attacks Past medication trials: See medication trials below Status of problem: New to provider Interventions: -- Patient to call insurer for therapist that is in network -- Discontinue stimulant -- Discontinue Xanax -- The patient amenable to medication trial in the future would consider Remeron -- Continue lamotrigine 200 mg twice daily per neurology -- Continue diazepam rescue medication per neurology  # Psychophysiologic insomnia with snoring and restless legs Past medication trials:  Status of problem: New to provider Interventions: -- Coordinate with PCP for sleep study, iron panel -- Consider Remeron as above -- Discontinue Xanax -- Discontinue stimulant  # Malnutrition with vitamin D and B12 deficiency and microcytic anemia Past medication trials:  Status of problem: New to provider Interventions: --  Coordinate with PCP for nutrition referral and iron panel as above -- Continue vitamin D, B12, B1, multivitamin supplement per PCP -- Discontinue stimulant  # Long-term prescription benzodiazepine use  long-term prescription opiate use for chronic jaw pain  long-term prescription stimulant use with history of ADD Past medication trials:  Status of problem: New to provider Interventions: -- Agree with neuropsych testing in January 2025 -- Discontinue stimulant, xanax -- Continue Norco per PCP  # Hypogonadism on testosterone Past medication trials:  Status of problem: New to provider Interventions: -- Consider endocrinology referral -- Continue testosterone 200 mg injection every 14 days per PCP for now  # IBS Past medication trials:  Status of problem: New to provider Interventions: -- Consider GI referral -- Continue loperamide, flaxseed, Lomotil per PCP for now  Patient was given contact information for behavioral health clinic and was instructed to call 911 for emergencies.   Subjective:  Chief Complaint:  Chief Complaint  Patient presents with   Stress   Establish Care   Anxiety    History of Present Illness:  Advised to have appointment by neurology. Seizures since right after Thanksgiving last year and have been able to rule out epilepsy. Trying to determine if from stress.   Lives with partner, [name]. No pets as had to put dog down right after Thanksgiving as well. Mother lives down the street, aunt and brother and sister are in the area but siblings don't communicate much with him because they did not help out with mom. 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 Since 2018 works with [employer] and works from home since [employer]. Likes to exercise and walk but hasn't been able to get out as much. Mother had a stroke a few years ago and then was diagnosed with dementia and has been helping out there but they will be moving into  assisted living soon. Still enjoys activities. Wakes at 5a every morning due to old work schedule. Not sleeping well but feels adequate. Racing thoughts. 6hrs per night. Snores with no sleep study. No vivid dreams or nightmares. Restless legs. Cup of coffee every other day, tea is usually decaffeinated. Appetite is usually one meal per day, rarely 2. Will eat when hungry, most typically dinner meal. No loss of control when eating. Denies intentional restriction but watches what he eats. No purging, will walk 1 hr after meals. Has worries not exercising enough like he used to but denies excessive worry around weight/body habitus. Concentration adequate. Fidgety. No guilt feelings. No SI past or present. Lowest he felt was after step-father passed away before the end of high school.   Chronic worry across multiple domains with impact on sleep and muscle tension. Panic attacks happen once per week or every other week. Fine with crowds, grew up in [place]. Longest period of sleeplessness was 2 days with 1 hr of sleep. One was after seizures started and had fear of next one occurring (were happening primarily at night). No hypersexuality, no hyper spending, no project starting, no grandiosity, no talkativeness, no excess energy. No hallucinations. No paranoia.   No alcohol at present since seizures started. Used to have 2 drinks on a Friday or Saturday. No period of heavier drinking. No tobacco products life long. No other drugs at present, had used CBD in coffee as recently as August 2024.  History of cold sores on mouth. None since 2022. Last xanax was 1mg  last week for sleep. Not consistent use. Taking adderall 10mg  once daily but not on weekends for ADD; had been tested previously. Taking lomotil for chronic diarrhea. Taking norco 10-325mg  once weekly for chronic jaw pain. Lamictal is 200mg  bid. Loperamide as needed. Cialis daily for prostate enlargement. Testosterone supplement for low testosterone. Also taking  lion's mane mushroom capsules for his gut at recommendation of PCP.   Past Psychiatric History:  Diagnoses: non-epileptic seizures, ADD, anxiety, chronic pain disorder, IBS, history of benzodiazepine withdrawal with delirium Medication trials: Doesn't like medication generally. Fluoxetine-olanzapine (listed as allergy), escitalopram (doesn't remember), paroxetine (doesn't remember), wellbutrin (controls his head), milnacipran, xanax, lamotrigine (effective for seizure behavior), lithium (doesn't remember), abilify, ritalin, buspar (didn't tolerate), depakote (headaches) Previous psychiatrist/therapist: none Hospitalizations: none Suicide attempts: none SIB: none Hx of violence towards others: none Current access to guns: yes, secured in gunsafe when not in use. Knows to access with combination Hx of trauma/abuse: none  Previous Psychotropic Medications: Yes   Substance Abuse History in the last 12 months:  Yes.    Past Medical History:  Past Medical History:  Diagnosis Date   Anxiety    Costochondritis    recurrent    Depression    Dr Dub Mikes   Depression    Dr Dub Mikes d/c  Dr Adrian Blackwater  Off work till December  2024  Off work till December  Per Dr Karel Jarvis:  " 57 yo RH man with a history of hypertension, hyperthyroidism, IBS, anxiety, tremor, with new onset seizures since 08/21/2022. He has nocturnal convulsions and staring episodes. MRI brain no acute  changes however there is note of moderate prominent enlargement of the atria and occipital horns of the rig   Fatigue 09/28/2007   Genital herpes 09/28/2007   GERD (gastroesophageal reflux disease)    Heat stroke    HTN (hypertension)    Hyperthyroidism    IBS (irritable bowel syndrome)    Panic attack    PANIC DISORDER 02/11/2009      Chronic, severe  11/20 withdrawal delirium post-op  Xanax prn   Potential benefits of a long term benzodiazepines  use as well as potential risks  and complications were explained to the patient and were  aknowledged.        Prostatitis    Very painful flare-ups Dr Retta Diones   Tremor     Past Surgical History:  Procedure Laterality Date   LAPAROSCOPIC ILEOCECECTOMY  07/25/2019   Procedure: Laparoscopic assisted  Ileocecectomy;  Surgeon: Andria Meuse, MD;  [facility]: [facility];  Service: General;;   MANDIBLE SURGERY     Right    Family Psychiatric History: mother with dementia  Family History:  Family History  Problem Relation Age of Onset   Hyperlipidemia Other    Coronary artery disease Other    Stroke Mother    Colon cancer Paternal Grandmother    Diabetes Other    Breast cancer Other    Pancreatic cancer Other    Cancer Other        breast, pancreatic   Heart disease Other    Cancer Maternal Grandmother        colon    Social History:   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  Previously 2-6 wine every 2/wks   Drug use: Not Currently    Comment: Previous CBD oil use   Sexual activity: Yes  Other Topics Concern   Not on file  Social History Narrative   Are you right handed or left handed? right   Are you currently employed ? 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 no   Who lives with you? Room mate   What type of home do you live in: 1 story or 2 story? 1.5 story        Social Determinants of Health   Financial Resource Strain: Not on file  Food Insecurity: No Food Insecurity (05/16/2023)    Hunger Vital Sign    Worried About Running Out of Food in the Last Year: Never true    Ran Out of Food in the Last Year: Never true  Transportation Needs: No Transportation Needs (05/16/2023)   PRAPARE - Administrator, Civil Service (Medical): No    Lack of Transportation (Non-Medical): No  Physical Activity: Not on file  Stress: Not on file  Social Connections: Not on file    Additional Social History: updated  Allergies:  Allergies  Allergen Reactions   Buspirone Hcl Other (See Comments)    Reaction not recalled by the patient   Divalproex Sodium Other (See Comments)    Severe headaches   Gluten Meal Other (See Comments)   Olanzapine-Fluoxetine Hcl Other (See Comments)    Reaction not recalled by the patient   Other Other (See Comments)   Piroxicam Other (See Comments)    Reaction not recalled by the patient   Wheat Diarrhea and Other (See Comments)    "Tears up my stomach"   Ibuprofen Rash and Other (See Comments)    Spots appear on chest appear    Current Medications: Current Outpatient Medications  Medication Sig Dispense Refill   acetaminophen (TYLENOL) 325 MG tablet Take 325-650 mg by mouth every 6 (six) hours as needed for mild pain or headache.      acyclovir (ZOVIRAX) 400 MG tablet Take 1 tablet (400 mg total) by mouth 4 (four) times daily as needed (as directed for outbreaks). 60 tablet 3   albuterol (VENTOLIN HFA) 108 (90 Base) MCG/ACT inhaler Inhale 2 puffs into the lungs every 4 (four) hours as needed for wheezing or shortness of breath. 18 g 5   alprazolam (XANAX) 2 MG tablet Take 0.5 tablets (1 mg total) by mouth 4 (four) times daily as needed for sleep or anxiety. 120 tablet 1   amLODipine-olmesartan (AZOR) 10-40 MG tablet Take 1 tablet by mouth daily. 90 tablet 3   amphetamine-dextroamphetamine (ADDERALL) 10 MG tablet Take 1 tablet (10 mg total) by mouth 2 (two) times daily with breakfast and lunch. 60 tablet 0   aspirin EC 81 MG tablet Take 1  tablet (81 mg total) by mouth daily. 100 tablet 3   b complex vitamins tablet Take 1 tablet by mouth daily.     Cholecalciferol (VITAMIN D3) 50 MCG (2000 UT) capsule Take 1 capsule (2,000 Units total) by mouth daily. 100 capsule 3   cyanocobalamin (VITAMIN B12) 1000 MCG/ML injection Inject 1 mL (1,000 mcg total) into the skin daily x 1 week, then once weekly for 1 month, then once every 2 weeks 10 mL 6   diazePAM, 20 MG Dose, (VALTOCO 20 MG DOSE) 2 x 10 MG/0.1ML LQPK Spray in nose as instructed for seizure. May use second dose after 4 hours. 4 each 5   diphenoxylate-atropine (LOMOTIL) 2.5-0.025 MG tablet TAKE 1 OR 2 TABLETS BY MOUTH 4 TIMES DAILY AS NEEDED FOR DIARRHEA OR LOOSE STOOLS MAX OF 8 TABLETS PER DAY (Patient taking differently: Take 1-2 tablets by mouth 4 (four) times daily as needed for diarrhea or loose stools (MAX OF 8 TABLETS/24 HOURS).) 60 tablet 3   Flaxseed, Linseed, (FLAX SEEDS) POWD 2 (two) times daily as needed (as directed when mixing into health shakes).     Fluticasone-Umeclidin-Vilant (TRELEGY ELLIPTA) 100-62.5-25 MCG/ACT AEPB Inhale 1 puff into the lungs daily. 60 each 11   glycopyrrolate (ROBINUL) 2 MG tablet Take 1 tablet (2 mg total) by mouth 2 (two) times daily. 180 tablet 3   HYDROcodone-acetaminophen (NORCO) 10-325 MG tablet Take 1 tablet by mouth every 6 (six) hours as needed. 120 tablet 0   lamoTRIgine (LAMICTAL) 200 MG tablet Take 1 tablet (200 mg total) by mouth 2 (two) times daily. 180 tablet 3   loperamide (IMODIUM) 2 MG capsule Take 2 mg by mouth as needed for diarrhea or loose stools.     Multiple Vitamin (MULTIVITAMIN) capsule Take 1 capsule by mouth daily.      [employer] (  OYSTER CALCIUM) 500 MG TABS tablet Take 500 mg of elemental calcium by mouth daily.     SYRINGE-NEEDLE, DISP, 3 ML (BD ECLIPSE SYRINGE) 25G X 1" 3 ML MISC Use as directed 50 each 3   tadalafil (CIALIS) 5 MG tablet Take 5 mg by mouth daily.     testosterone cypionate (DEPOTESTOSTERONE  CYPIONATE) 200 MG/ML injection Inject 1 mL (200 mg total) into the muscle every 14 (fourteen) days. 10 mL 5   zinc gluconate 50 MG tablet Take 100 mg by mouth daily.     No current facility-administered medications for this visit.    ROS: Review of Systems  Constitutional:  Positive for appetite change and unexpected weight change.  Gastrointestinal:  Positive for abdominal pain and diarrhea. Negative for constipation, nausea and vomiting.  Endocrine: Positive for heat intolerance. Negative for cold intolerance and polyphagia.  Musculoskeletal:  Positive for arthralgias.  Skin:        Hair loss  Neurological:        Seizures  Psychiatric/Behavioral:  Positive for sleep disturbance. Negative for decreased concentration, dysphoric mood, hallucinations, self-injury and suicidal ideas. The patient is nervous/anxious. The patient is not hyperactive.     Objective:  Psychiatric Specialty Exam: There were no vitals taken for this visit.There is no height or weight on file to calculate BMI.  General Appearance: Casual, Fairly Groomed, and wearing glasses.  Appears stated age  Eye Contact:  Fair  Speech:  Clear and Coherent, Normal Rate, and halting at times with some word-finding difficulty  Volume:  Normal  Mood:   "I am stressed"  Affect:  Appropriate, Congruent, and anxious.  Slightly tearful at times  Thought Content: Logical and Hallucinations: None   Suicidal Thoughts:  No  Homicidal Thoughts:  No  Thought Process:  Descriptions of Associations: Circumstantial  Orientation:  Full (Time, Place, and Person)    Memory: Some difficulties with recall noted  Judgment:  Fair  Insight:  Fair  Concentration:  Concentration: Fair and Attention Span: Fair  Recall:  not formally assessed   Fund of Knowledge: Fair  Language: Fair  Psychomotor Activity:  Increased and Restlessness  Akathisia:  No  AIMS (if indicated): not done  Assets:  Communication Skills Desire for  Improvement Financial Resources/Insurance Housing Intimacy Leisure Time Physical Health Resilience Social Support Talents/Skills Transportation Vocational/Educational  ADL's:  Impaired  Cognition: WNL  Sleep:  Poor   PE: General: sits comfortably in view of camera; no acute distress  Pulm: no increased work of breathing on room air  MSK: all extremity movements appear intact  Neuro: no focal neurological deficits observed  Gait & Station: unable to assess by video    Metabolic Disorder Labs: No results found for: "HGBA1C", "MPG" No results found for: "PROLACTIN" Lab Results  Component Value Date   CHOL 172 03/01/2023   TRIG 127.0 03/01/2023   HDL 46.80 03/01/2023   CHOLHDL 4 03/01/2023   VLDL 25.4 03/01/2023   LDLCALC 99 03/01/2023   LDLCALC 108 (H) 03/09/2021   Lab Results  Component Value Date   TSH 1.78 03/01/2023    Therapeutic Level Labs: Lab Results  Component Value Date   LITHIUM 0.14 (L) 06/22/2010   No results found for: "CBMZ" No results found for: "VALPROATE"  Screenings:  GAD-7    Flowsheet Row Office Visit from 06/06/2023 in [facility]  Total GAD-7 Score 10      PHQ2-9    Flowsheet Row Office Visit from  07/11/2023 in [facility] at [facility] Visit from 06/06/2023 in [facility] Office Visit from 03/21/2023 in [facility] Office Visit from 01/10/2023 in [facility] Office Visit from 11/18/2022 in [facility]  PHQ-2 Total Score 1 3 0 0 0  PHQ-9 Total Score 10 7 -- 0 --      Flowsheet Row Office Visit from 07/11/2023 in [facility] at [place] Admission (Discharged) from 05/16/2023 in [facility]  C-SSRS RISK CATEGORY No Risk Error: Q3, 4, or 5 should not be populated when Q2 is No        Collaboration of Care: Collaboration of Care: Primary Care Provider AEB as above and Other provider involved in patient's care AEB neurology as above  Patient/Guardian was advised Release of Information must be obtained prior to any record release in order to collaborate their care with an outside provider. Patient/Guardian was advised if they have not already done so to contact the registration department to sign all necessary forms in order for [place] to release information regarding their care.   Consent: Patient/Guardian gives verbal consent for treatment and assignment of benefits for services provided during this visit. Patient/Guardian expressed understanding and agreed to proceed.   Televisit via video: I connected with Luther Redo on 07/11/23 at 10:00 AM EDT by a video enabled telemedicine application and verified that I am speaking with the correct person using two identifiers.  Location: Patient: home in [place] Provider: home office   I discussed the limitations of evaluation and management by telemedicine and the availability of in person appointments. The patient expressed understanding and agreed to proceed.  I discussed the assessment and treatment plan with the patient. The patient was provided an opportunity to ask questions and all were answered. The patient agreed with the plan and demonstrated an understanding of the instructions.   The patient was advised to call back or seek an in-person evaluation if the symptoms worsen or if the condition fails to improve as anticipated.  I provided 65 minutes dedicated to the care of this patient via video on the date of this encounter to include chart review, face-to-face time with the patient, medication management/counseling, coordination of care with primary care and neurology.  Elsie Lincoln, MD 10/14/20241:03 PM

## 2023-07-11 NOTE — Patient Instructions (Signed)
We did not make any medication changes today.  It would likely be helpful to trial not taking the Adderall medication to see if this helps improve your appetite and overall anxiety levels.  Stimulant medication can lower the seizure threshold so to be very cautious, and fully off of this medication should have multiple benefits.  The neuropsychiatric testing will have done in January 2025 should also give a more definitive answer for the ADD diagnosis.  I will coordinate with your PCP about getting a nutrition referral and checking an iron panel to rule out an iron deficiency.  I will also see if he can place a referral for a sleep study.  I would also recommend stopping the Xanax at this point due to the long-term risks associated with sustained use.  If he did desire medication in the future feel free to reach out to my clinic or coordinate with your PCP, I think the one for you would be Remeron (mirtazapine).  If he desires psychotherapy please reach out to your insurer to find a therapist that is in network.

## 2023-07-18 ENCOUNTER — Other Ambulatory Visit: Payer: Self-pay | Admitting: Internal Medicine

## 2023-07-18 ENCOUNTER — Other Ambulatory Visit: Payer: Self-pay

## 2023-07-19 ENCOUNTER — Other Ambulatory Visit: Payer: Self-pay

## 2023-07-20 ENCOUNTER — Encounter: Payer: Self-pay | Admitting: Neurology

## 2023-07-20 ENCOUNTER — Other Ambulatory Visit (HOSPITAL_COMMUNITY): Payer: Self-pay

## 2023-07-20 ENCOUNTER — Ambulatory Visit (INDEPENDENT_AMBULATORY_CARE_PROVIDER_SITE_OTHER): Payer: Managed Care, Other (non HMO) | Admitting: Neurology

## 2023-07-20 ENCOUNTER — Other Ambulatory Visit: Payer: Self-pay

## 2023-07-20 VITALS — BP 141/95 | HR 79 | Resp 20 | Wt 210.0 lb

## 2023-07-20 DIAGNOSIS — R4020 Unspecified coma: Secondary | ICD-10-CM

## 2023-07-20 DIAGNOSIS — F5104 Psychophysiologic insomnia: Secondary | ICD-10-CM | POA: Diagnosis not present

## 2023-07-20 DIAGNOSIS — R569 Unspecified convulsions: Secondary | ICD-10-CM | POA: Diagnosis not present

## 2023-07-20 MED ORDER — MIRTAZAPINE 7.5 MG PO TABS
7.5000 mg | ORAL_TABLET | Freq: Every day | ORAL | 5 refills | Status: DC
  Filled 2023-07-20: qty 30, 30d supply, fill #0
  Filled 2023-08-19: qty 30, 30d supply, fill #1
  Filled 2023-10-10: qty 30, 30d supply, fill #2
  Filled 2023-11-07: qty 30, 30d supply, fill #3
  Filled 2023-12-05: qty 30, 30d supply, fill #4
  Filled 2024-01-04 – 2024-01-24 (×2): qty 30, 30d supply, fill #5

## 2023-07-20 MED ORDER — AMPHETAMINE-DEXTROAMPHETAMINE 10 MG PO TABS
10.0000 mg | ORAL_TABLET | Freq: Two times a day (BID) | ORAL | 0 refills | Status: DC
  Filled 2023-07-20: qty 60, 30d supply, fill #0

## 2023-07-20 MED ORDER — HYDROCODONE-ACETAMINOPHEN 10-325 MG PO TABS
1.0000 | ORAL_TABLET | Freq: Four times a day (QID) | ORAL | 0 refills | Status: DC | PRN
  Filled 2023-07-20: qty 120, 30d supply, fill #0

## 2023-07-20 NOTE — Patient Instructions (Addendum)
I hope you start feeling well soon.  Schedule 2-week holter monitor  2. Please contact Laroy Apple if he is still available for Cognitive Behavioral Therapy [phone] [email or web address]  3. Try the Mirtazapine 7.5mg : take 1 tablet every night. Depending on how you feel, we may increase the dose  4. If [name] can take a video of the episodes, this would be helpful  5. We will look into another EMU admission to further characterize these events  5. Follow-up in 2 months, call for any changes

## 2023-07-20 NOTE — Progress Notes (Unsigned)
NEUROLOGY FOLLOW UP OFFICE NOTE  Mark Mcgee [id number] 11/15/1965  HISTORY OF PRESENT ILLNESS: I had the pleasure of seeing Mark Mcgee in follow-up in the neurology clinic on 07/20/2023.  The patient was last seen 3 months ago for new onset seizures that started in November 2023. He is alone in the office today. Records and images were personally reviewed where available.  His ambulatory EEG captured an episode where his muscles felt sore after, there was no video but the EEG did not show any changes during push button events. He was admitted to the EMU from Aug 19-23, 2024 where Lamotrigine and Zonisamide were held. Baseline EEG was normal. There was one smaller episode captured where he has left index finger twitching followed by left arm twitching/stiffening. He reports he felt the same constricting sensation in his throat prior. EEG normal during event. He has been on Lamotrigine, dose increased to 200mg  BID last month when he reported near daily seizures where he had injured himself (bruises, bloody nose, muscles sore). Since his last visit, he has been seen by psychiatrist Dr. 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 He does have big psychosocial stressors that could have precipitated the events which would fit the pattern. Dr. Adrian Blackwater recommended stopping his stimulant and Xanax.   Still having these werid things that happen, quite scary Half of xanax periodically not sleeping well; helps to sleep Stopped adderall Last episode was 10/20 at 1am; not feelign good, took tylenol, lay on couch, then next thing, he is over me on the floor with bloody nose, hits left side of face Does not remember what he was doing before, takes long to get back into himself Bag of nerves Can't even concentrate to read Work wants him to return tomorrow      [name] [phone] [email or web address]   I had the pleasure of seeing Mark Mcgee in  follow-up in the neurology clinic on 05/27/2023.  The patient was last seen 3 months ago for new onset seizures that started in November 2023. He is alone in the office today. Records and images were personally reviewed where available. Lamotrigine was added to Zonisamide on his last visit, dose increased early August due to continued report of seizures.  Zonisamide was discontinued in light of normal baseline EEG and nonepileptic event, as well as side effects on Zonisamide. He was continued on Lamotrigine 100mg  BID as he feels that it has helped him. He reports that he was doing well while admitted to the hospital, however once he got home, he had on on 8/25 lasting 2 minutes, 8/27 lasting 1-2 minutes, then 8/30 as he was getting water in the kitchen, he felt like something pulling his neck, he came to on the floor. He gets so confused afterwards with recall being very off. He is scheduled for Neurocognitive testing in January 2025.    History on Initial Assessment 09/21/2022: This is a pleasant 57 year old right-handed man with a history of hypertension, hyperthyroidism, IBS, anxiety, tremor, presenting for evaluation of new onset seizures. He denies any prior history of seizures until 08/21/22 when he had 3 seizures in one day. He was stressed out after putting his dog to sleep. He recalls driving home and pulling into the driveway but pulled in crooked. His right leg was slamming the break and he could not let go, he knew what was going on but could not do anything. He felt like he was stuck. His friend in  the car with him was able to stop the car. He was able to get out of the car and then his friend witnessed a generalized convulsion. He had shaking so bad it dented the side of his car with his arm. His friend lay him on his side, then he woke up and recalls getting up and walking around the car, then he felt like he was being choked then had another seizure. After this, he recalls getting up and going in  the house then proceeded to have a third convulsion where he had urinary incontinence then vomited after. When EMS arrived, he was mildly confused with a hard time finding words. He recalls feeling very jittery, no focal weakness. He was brought to the ER where bloodwork showed a WBC of 15, creatinine 1.33. EKG NSR. Head CT no acute changes. UDS positive for opiates and THC. He is on hydrocodone for chronic jaw pain and using CBD gummies to help with sleep/anxiety. Since then, he has had at least 5 more episodes of loss of consciousness where he wakes up on the floor. Last episode was last night, he went to the mantel to fix something then woke up on the floor. There was one witnessed incident 2 weeks ago where his friend told him he was staring and unresponsive for 5 minutes. He gets flashes in his eyes before the episodes, then when he wakes up it feels like he went through a workout at the gym. His chest muscles still hurt. Since then, he has also had spasms where what he is holding in his hand goes flying out. He loses his train of thought a lot. Since the initial seizures, he could not recall how to do things at work that he had been doing for over 6 years (works with [employer]). He feels like his Mark is scrambled. He has not prior history of headaches but since the seizures has had constant headaches with pressure on both side, 2/10 in intensity today, no nausea/vomiting. He is sensitive to lights. He has had loss of appetite. He has bilateral tinnitus, worse at night. Masking has not helped. HE is not sleeping well, getting 4-6 hours of sleep. Prior to the seizures, he was taking alprazolam around 2 times a week, but since the seizures he has had a constant nervousness and worsening hand tremors and taking 1/4-1/2 tablet daily. His PCP started Levetiracetam 500mg  BID which is making him sleepy. He denies any dizziness, diplopia, neck/back pain, bowel/bladder dysfunction. He was born 3 months premature. 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 He had a normal early development.  There is no history of febrile convulsions, CNS infections such as meningitis/encephalitis, significant traumatic Mark injury, neurosurgical procedures, or family history of seizures. His sister also has tremors.  Prior ASMs: Levetiracetam, oxcarbazepine, Zonisamide  Diagnostic Data: Mark MRI with and without contrast done 09/2022 no acute changes. There is moderately prominent enlargement of the atria and occipital horns of the right greater than left lateral ventricles with associated surrounding white matter volume loss. One or two small foci of T2 FLAIR hyperintensity in the cerebral white matter are within normal limits for age.   Routine EEG 08/2022 normal  69-hour Ambulatory EEG 12/2022 normal. Push button events showed movement artifact, no epileptiform discharges seen. No video recorded.   Lumbar puncture 11/2022: mildly elevated CSF protein 56, normal cell count, glucose. Negative autoimmune panel.  PAST MEDICAL HISTORY: Past Medical History:  Diagnosis  Date   Anxiety    Costochondritis    recurrent    Depression    Dr Dub Mikes   Depression    Dr Dub Mikes d/c  Dr Adrian Blackwater  Off work till December  2024  Off work till December  Per Dr Karel Jarvis:  " 57 yo RH man with a history of hypertension, hyperthyroidism, IBS, anxiety, tremor, with new onset seizures since 08/21/2022. He has nocturnal convulsions and staring episodes. MRI Mark no acute changes however there is note of moderate prominent enlargement of the atria and occipital horns of the rig   Fatigue 09/28/2007   Genital herpes 09/28/2007   GERD (gastroesophageal reflux disease)    Heat stroke    HTN (hypertension)    Hyperthyroidism    IBS (irritable bowel syndrome)    Panic attack    PANIC DISORDER 02/11/2009      Chronic, severe  11/20 withdrawal delirium post-op  Xanax prn   Potential benefits of a long term benzodiazepines  use as well as  potential risks  and complications were explained to the patient and were aknowledged.        Prostatitis    Very painful flare-ups Dr Retta Diones   Tremor     MEDICATIONS: Current Outpatient Medications on File Prior to Visit  Medication Sig Dispense Refill   acetaminophen (TYLENOL) 325 MG tablet Take 325-650 mg by mouth every 6 (six) hours as needed for mild pain or headache.      acyclovir (ZOVIRAX) 400 MG tablet Take 1 tablet (400 mg total) by mouth 4 (four) times daily as needed (as directed for outbreaks). 60 tablet 3   albuterol (VENTOLIN HFA) 108 (90 Base) MCG/ACT inhaler Inhale 2 puffs into the lungs every 4 (four) hours as needed for wheezing or shortness of breath. 18 g 5   alprazolam (XANAX) 2 MG tablet Take 0.5 tablets (1 mg total) by mouth 4 (four) times daily as needed for sleep or anxiety. 120 tablet 1   amLODipine-olmesartan (AZOR) 10-40 MG tablet Take 1 tablet by mouth daily. 90 tablet 3   amphetamine-dextroamphetamine (ADDERALL) 10 MG tablet Take 1 tablet (10 mg total) by mouth 2 (two) times daily with breakfast and lunch. 60 tablet 0   aspirin EC 81 MG tablet Take 1 tablet (81 mg total) by mouth daily. 100 tablet 3   b complex vitamins tablet Take 1 tablet by mouth daily.     Cholecalciferol (VITAMIN D3) 50 MCG (2000 UT) capsule Take 1 capsule (2,000 Units total) by mouth daily. 100 capsule 3   cyanocobalamin (VITAMIN B12) 1000 MCG/ML injection Inject 1 mL (1,000 mcg total) into the skin daily x 1 week, then once weekly for 1 month, then once every 2 weeks 10 mL 6   diazePAM, 20 MG Dose, (VALTOCO 20 MG DOSE) 2 x 10 MG/0.1ML LQPK Spray in nose as instructed for seizure. May use second dose after 4 hours. 4 each 5   diphenoxylate-atropine (LOMOTIL) 2.5-0.025 MG tablet TAKE 1 OR 2 TABLETS BY MOUTH 4 TIMES DAILY AS NEEDED FOR DIARRHEA OR LOOSE STOOLS MAX OF 8 TABLETS PER DAY (Patient taking differently: Take 1-2 tablets by mouth 4 (four) times daily as needed for diarrhea or loose  stools (MAX OF 8 TABLETS/24 HOURS).) 60 tablet 3   Flaxseed, Linseed, (FLAX SEEDS) POWD 2 (two) times daily as needed (as directed when mixing into health shakes).     Fluticasone-Umeclidin-Vilant (TRELEGY ELLIPTA) 100-62.5-25 MCG/ACT AEPB Inhale 1 puff into the lungs daily. 60 each  11   glycopyrrolate (ROBINUL) 2 MG tablet Take 1 tablet (2 mg total) by mouth 2 (two) times daily. 180 tablet 3   HYDROcodone-acetaminophen (NORCO) 10-325 MG tablet Take 1 tablet by mouth every 6 (six) hours as needed. 120 tablet 0   lamoTRIgine (LAMICTAL) 200 MG tablet Take 1 tablet (200 mg total) by mouth 2 (two) times daily. 180 tablet 3   loperamide (IMODIUM) 2 MG capsule Take 2 mg by mouth as needed for diarrhea or loose stools.     Multiple Vitamin (MULTIVITAMIN) capsule Take 1 capsule by mouth daily.      Oyster Shell (OYSTER CALCIUM) 500 MG TABS tablet Take 500 mg of elemental calcium by mouth daily.     SYRINGE-NEEDLE, DISP, 3 ML (BD ECLIPSE SYRINGE) 25G X 1" 3 ML MISC Use as directed 50 each 3   tadalafil (CIALIS) 5 MG tablet Take 5 mg by mouth daily.     testosterone cypionate (DEPOTESTOSTERONE CYPIONATE) 200 MG/ML injection Inject 1 mL (200 mg total) into the muscle every 14 (fourteen) days. 10 mL 5   zinc gluconate 50 MG tablet Take 100 mg by mouth daily.     No current facility-administered medications on file prior to visit.    ALLERGIES: Allergies  Allergen Reactions   Buspirone Hcl Other (See Comments)    Reaction not recalled by the patient   Divalproex Sodium Other (See Comments)    Severe headaches   Gluten Meal Other (See Comments)   Olanzapine-Fluoxetine Hcl Other (See Comments)    Reaction not recalled by the patient   Other Other (See Comments)   Piroxicam Other (See Comments)    Reaction not recalled by the patient   Wheat Diarrhea and Other (See Comments)    "Tears up my stomach"   Ibuprofen Rash and Other (See Comments)    Spots appear on chest appear    FAMILY  HISTORY: Family History  Problem Relation Age of Onset   Hyperlipidemia Other    Coronary artery disease Other    Stroke Mother    Colon cancer Paternal Grandmother    Diabetes Other    Breast cancer Other    Pancreatic cancer Other    Cancer Other        breast, pancreatic   Heart disease Other    Cancer Maternal Grandmother        colon    SOCIAL HISTORY: Social History   Socioeconomic History   Marital status: Single    Spouse name: Not on file   Number of children: Not on file   Years of education: Not on file   Highest education level: Not on file  Occupational History   Occupation: Teacher, adult education: [employer]  Tobacco Use   Smoking status: Never   Smokeless tobacco: Never  Vaping Use   Vaping status: Never Used  Substance and Sexual Activity   Alcohol use: Not Currently    Comment: None currently.  Previously 2-6 wine every 2/wks   Drug use: Not Currently    Comment: Previous CBD oil use   Sexual activity: Yes  Other Topics Concern   Not on file  Social History Narrative   Are you right handed or left handed? right   Are you currently employed ? 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 no   Who lives with you? Room mate   What type of home do  you live in: 1 story or 2 story? 1.5 story        Social Determinants of Health   Financial Resource Strain: Not on file  Food Insecurity: No Food Insecurity (05/16/2023)   Hunger Vital Sign    Worried About Running Out of Food in the Last Year: Never true    Ran Out of Food in the Last Year: Never true  Transportation Needs: No Transportation Needs (05/16/2023)   PRAPARE - Administrator, Civil Service (Medical): No    Lack of Transportation (Non-Medical): No  Physical Activity: Not on file  Stress: Not on file  Social Connections: Not on file  Intimate Partner Violence: Not At Risk (05/16/2023)   Humiliation, Afraid, Rape, and Kick questionnaire     Fear of Current or Ex-Partner: No    Emotionally Abused: No    Physically Abused: No    Sexually Abused: No     PHYSICAL EXAM: There were no vitals filed for this visit. General: No acute distress Head:  Normocephalic/atraumatic Skin/Extremities: No rash, no edema Neurological Exam: alert and oriented to person, place, and time. No aphasia or dysarthria. Fund of knowledge is appropriate.  Recent and remote memory are intact.  Attention and concentration are normal.   Cranial nerves: Pupils equal, round. Extraocular movements intact with no nystagmus. Visual fields full.  No facial asymmetry.  Motor: Bulk and tone normal, muscle strength 5/5 throughout with no pronator drift.   Finger to nose testing intact.  Gait narrow-based and steady, able to tandem walk adequately.  Romberg negative.   IMPRESSION: This is a pleasant 57 yo RH man with a history of hypertension, hyperthyroidism, IBS, anxiety, tremor, with new onset seizures since 08/21/2022. He has nocturnal convulsions and staring episodes. MRI [place] no acute changes however there is note of moderate prominent enlargement of the atria and occipital horns of the right greater than left lateral ventricles, hippocampi symmetric. His ambulatory EEG captured seizures with no EEG correlate, then he was admitted to the EMU for 5 days where medications were held and he had a smaller episode of throat constriction, left arm stiffening/twitching that  did not show EEG correlate. Bigger events were not captured and he was discharged home on Lamotrigine 100mg  BID. 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 He is understandably affected by his current condition, I discussed referral to Dr. Adrian Blackwater with Psychiatry and Cognitive Behavioral Therapy. For cognitive changes (likely due to underlying depression), he will be referred to Speech therapy for cognitive therapy and he is scheduled for  Neurocognitive testing in January 2025. We discussed difficulties with functioning at work and short-term leave until August 28, 2023 (tentative return to work 08/29/23 if able). He is aware of [place] driving laws to stop driving after an episode of loss of consciousness until 6 months event-free. Follow-up in 2 months, call for any changes.       Thank you for allowing me to participate in *** care.  Please do not hesitate to call for any questions or concerns.  The duration of this appointment visit was *** minutes of face-to-face time with the patient.  Greater than 50% of this time was spent in counseling, explanation of diagnosis, planning of further management, and coordination of care.   Patrcia Dolly, M.D.   CC: ***

## 2023-07-22 ENCOUNTER — Other Ambulatory Visit: Payer: Self-pay

## 2023-07-22 ENCOUNTER — Ambulatory Visit: Payer: Managed Care, Other (non HMO) | Attending: Neurology

## 2023-07-22 DIAGNOSIS — R569 Unspecified convulsions: Secondary | ICD-10-CM

## 2023-07-22 DIAGNOSIS — R4020 Unspecified coma: Secondary | ICD-10-CM

## 2023-07-22 NOTE — Progress Notes (Unsigned)
Enrolled patient for a 14 day Zio AT monitor to be mailed to patients home.  

## 2023-08-05 DIAGNOSIS — I4719 Other supraventricular tachycardia: Secondary | ICD-10-CM

## 2023-08-05 DIAGNOSIS — R4020 Unspecified coma: Secondary | ICD-10-CM | POA: Diagnosis not present

## 2023-08-12 ENCOUNTER — Encounter: Payer: Self-pay | Admitting: Neurology

## 2023-08-14 ENCOUNTER — Encounter: Payer: Self-pay | Admitting: Internal Medicine

## 2023-08-15 ENCOUNTER — Other Ambulatory Visit: Payer: Self-pay

## 2023-08-15 DIAGNOSIS — G40009 Localization-related (focal) (partial) idiopathic epilepsy and epileptic syndromes with seizures of localized onset, not intractable, without status epilepticus: Secondary | ICD-10-CM

## 2023-08-15 DIAGNOSIS — R569 Unspecified convulsions: Secondary | ICD-10-CM

## 2023-08-19 ENCOUNTER — Other Ambulatory Visit: Payer: Self-pay | Admitting: Internal Medicine

## 2023-08-19 ENCOUNTER — Other Ambulatory Visit: Payer: Self-pay

## 2023-08-22 ENCOUNTER — Other Ambulatory Visit: Payer: Self-pay

## 2023-08-22 ENCOUNTER — Other Ambulatory Visit (HOSPITAL_COMMUNITY): Payer: Self-pay

## 2023-08-22 MED ORDER — TESTOSTERONE CYPIONATE 200 MG/ML IM SOLN
200.0000 mg | INTRAMUSCULAR | 5 refills | Status: DC
  Filled 2023-08-22: qty 6, 84d supply, fill #0
  Filled 2024-01-04 – 2024-02-09 (×2): qty 6, 84d supply, fill #1
  Filled 2024-02-11: qty 10, 140d supply, fill #1

## 2023-08-22 MED ORDER — ALPRAZOLAM 2 MG PO TABS
1.0000 mg | ORAL_TABLET | Freq: Four times a day (QID) | ORAL | 1 refills | Status: DC | PRN
  Filled 2023-08-22 – 2023-11-07 (×3): qty 120, 60d supply, fill #0
  Filled 2024-01-04 – 2024-02-09 (×2): qty 120, 60d supply, fill #1

## 2023-08-22 MED ORDER — AMPHETAMINE-DEXTROAMPHETAMINE 10 MG PO TABS
10.0000 mg | ORAL_TABLET | Freq: Two times a day (BID) | ORAL | 0 refills | Status: DC
  Filled 2023-08-22: qty 60, 30d supply, fill #0

## 2023-08-22 MED ORDER — HYDROCODONE-ACETAMINOPHEN 10-325 MG PO TABS
1.0000 | ORAL_TABLET | Freq: Four times a day (QID) | ORAL | 0 refills | Status: DC | PRN
  Filled 2023-08-22: qty 120, 30d supply, fill #0

## 2023-09-03 ENCOUNTER — Other Ambulatory Visit (HOSPITAL_COMMUNITY): Payer: Self-pay

## 2023-09-03 ENCOUNTER — Other Ambulatory Visit (HOSPITAL_BASED_OUTPATIENT_CLINIC_OR_DEPARTMENT_OTHER): Payer: Self-pay

## 2023-09-05 ENCOUNTER — Other Ambulatory Visit (HOSPITAL_COMMUNITY): Payer: Self-pay

## 2023-09-05 ENCOUNTER — Telehealth (INDEPENDENT_AMBULATORY_CARE_PROVIDER_SITE_OTHER): Payer: Managed Care, Other (non HMO) | Admitting: Internal Medicine

## 2023-09-05 ENCOUNTER — Other Ambulatory Visit: Payer: Self-pay

## 2023-09-05 ENCOUNTER — Other Ambulatory Visit: Payer: Self-pay | Admitting: Internal Medicine

## 2023-09-05 ENCOUNTER — Encounter: Payer: Self-pay | Admitting: Internal Medicine

## 2023-09-05 ENCOUNTER — Encounter (HOSPITAL_COMMUNITY): Payer: Self-pay

## 2023-09-05 DIAGNOSIS — R569 Unspecified convulsions: Secondary | ICD-10-CM

## 2023-09-05 DIAGNOSIS — H9313 Tinnitus, bilateral: Secondary | ICD-10-CM

## 2023-09-05 DIAGNOSIS — F988 Other specified behavioral and emotional disorders with onset usually occurring in childhood and adolescence: Secondary | ICD-10-CM | POA: Diagnosis not present

## 2023-09-05 DIAGNOSIS — F411 Generalized anxiety disorder: Secondary | ICD-10-CM | POA: Diagnosis not present

## 2023-09-05 DIAGNOSIS — F410 Panic disorder [episodic paroxysmal anxiety] without agoraphobia: Secondary | ICD-10-CM

## 2023-09-05 DIAGNOSIS — F4323 Adjustment disorder with mixed anxiety and depressed mood: Secondary | ICD-10-CM

## 2023-09-05 MED ORDER — BD LUER-LOK SYRINGE 22G X 1" 3 ML MISC
1.0000 | 3 refills | Status: DC
  Filled 2023-09-05: qty 12, 84d supply, fill #0

## 2023-09-05 MED ORDER — TADALAFIL 5 MG PO TABS
5.0000 mg | ORAL_TABLET | Freq: Every day | ORAL | 3 refills | Status: DC
  Filled 2023-09-05: qty 30, 30d supply, fill #0
  Filled 2023-11-07: qty 30, 30d supply, fill #1
  Filled 2023-12-05: qty 30, 30d supply, fill #2
  Filled 2024-01-04 – 2024-01-24 (×2): qty 30, 30d supply, fill #3
  Filled 2024-03-19: qty 30, 30d supply, fill #4
  Filled 2024-04-20: qty 30, 30d supply, fill #5
  Filled 2024-06-11: qty 30, 30d supply, fill #6
  Filled 2024-07-19: qty 30, 30d supply, fill #7
  Filled 2024-08-28: qty 30, 30d supply, fill #8

## 2023-09-05 MED ORDER — SYRINGE/NEEDLE (DISP) 22G X 1-1/2" 3 ML MISC
3 refills | Status: AC
  Filled 2023-09-05: qty 12, 84d supply, fill #0
  Filled 2023-12-05: qty 12, 84d supply, fill #1
  Filled 2024-04-20: qty 12, 84d supply, fill #2
  Filled 2024-07-19: qty 12, 84d supply, fill #3

## 2023-09-05 NOTE — Assessment & Plan Note (Signed)
On Lamictal F/u w/ Dr Karel Jarvis Cognitive therapy

## 2023-09-05 NOTE — Progress Notes (Signed)
Virtual Visit via Video Note  I connected with Mark Mcgee on 09/05/23 at 10:00 AM EST by a video enabled telemedicine application and verified that I am speaking with the correct person using two identifiers.   I discussed the limitations of evaluation and management by telemedicine and the availability of in person appointments. The patient expressed understanding and agreed to proceed.  I was located at our [facility] office. The patient was at home. There was no one else present in the visit.  No chief complaint on file.    F/u on seizures, ADD, anxiety C/o tinnitus  History of Present Illness:   Review of Systems  HENT:  Negative for congestion and sore throat.   Eyes:  Negative for blurred vision and double vision.  Respiratory:  Negative for cough and sputum production.   Cardiovascular:  Negative for palpitations.  Gastrointestinal:  Positive for diarrhea.  Genitourinary:  Negative for frequency.  Musculoskeletal:  Positive for back pain.  Neurological:  Positive for dizziness, tremors and seizures. Negative for loss of consciousness and weakness.  Psychiatric/Behavioral:  Positive for depression and memory loss. The patient is nervous/anxious and has insomnia.      Observations/Objective: The patient appears to be in no acute distress. 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)    On Lamictal F/u w/ Dr Karel Jarvis Cognitive therapy      Adjustment disorder with mixed anxiety and depressed mood    On Lamictal F/u w/ Dr Claudette Head had a ZIO patch x2 wks Cognitive therapy      ADD (attention deficit disorder) without hyperactivity    On 1/2 dose      Seizure (HCC) - Primary    On Lamictal F/u w/ Dr Karel Jarvis Cognitive therapy      Other Visit Diagnoses     Tinnitus of both ears       Relevant Orders   Ambulatory referral to ENT        Meds ordered this  encounter  Medications   SYRINGE-NEEDLE, DISP, 3 ML (B-D 3CC LUER-LOK SYR 22GX1") 22G X 1" 3 ML MISC    Sig: Use IM for injections    Dispense:  50 each    Refill:  3   tadalafil (CIALIS) 5 MG tablet    Sig: Take 1 tablet (5 mg total) by mouth daily.    Dispense:  90 tablet    Refill:  3     Follow Up Instructions:    I discussed the assessment and treatment plan with the patient. The patient was provided an opportunity to ask questions and all were answered. The patient agreed with the plan and demonstrated an understanding of the instructions.   The patient was advised to call back or seek an in-person evaluation if the symptoms worsen or if the condition fails to improve as anticipated.  I provided face-to-face time during this encounter. We were at different locations.   Sonda Primes, MD

## 2023-09-05 NOTE — Assessment & Plan Note (Signed)
On Lamictal F/u w/ Dr Claudette Head had a ZIO patch x2 wks Cognitive therapy

## 2023-09-05 NOTE — Assessment & Plan Note (Signed)
On 1/2 dose

## 2023-09-06 ENCOUNTER — Other Ambulatory Visit: Payer: Self-pay

## 2023-09-14 ENCOUNTER — Other Ambulatory Visit (HOSPITAL_COMMUNITY): Payer: Self-pay

## 2023-09-15 ENCOUNTER — Encounter: Payer: Self-pay | Admitting: Neurology

## 2023-09-15 ENCOUNTER — Ambulatory Visit: Payer: Managed Care, Other (non HMO) | Admitting: Neurology

## 2023-09-15 ENCOUNTER — Other Ambulatory Visit: Payer: Self-pay

## 2023-09-15 VITALS — BP 124/76 | HR 79 | Ht 68.0 in | Wt 217.8 lb

## 2023-09-15 DIAGNOSIS — R251 Tremor, unspecified: Secondary | ICD-10-CM | POA: Diagnosis not present

## 2023-09-15 DIAGNOSIS — R569 Unspecified convulsions: Secondary | ICD-10-CM | POA: Diagnosis not present

## 2023-09-15 MED ORDER — LAMOTRIGINE 200 MG PO TABS
200.0000 mg | ORAL_TABLET | Freq: Two times a day (BID) | ORAL | 3 refills | Status: DC
  Filled 2023-09-15: qty 180, 90d supply, fill #0

## 2023-09-15 MED ORDER — GABAPENTIN 300 MG PO CAPS
300.0000 mg | ORAL_CAPSULE | Freq: Every day | ORAL | 5 refills | Status: AC
Start: 2023-09-15 — End: ?
  Filled 2023-09-15: qty 30, 30d supply, fill #0
  Filled 2023-11-07: qty 30, 30d supply, fill #1
  Filled 2023-12-05: qty 30, 30d supply, fill #2
  Filled 2024-01-04 – 2024-01-24 (×2): qty 30, 30d supply, fill #3

## 2023-09-15 NOTE — Progress Notes (Unsigned)
NEUROLOGY FOLLOW UP OFFICE NOTE  Mark Mcgee [id number] 1966/05/24  HISTORY OF PRESENT ILLNESS: I had the pleasure of seeing Mark Mcgee in follow-up in the neurology clinic on 09/15/2023.  The patient was last seen 2 months ago for seizures that started in November 2023. Records and images were personally reviewed where available.  His MyChart message last month reported 3 episodes. He had started doing Cognitive Behavioral Therapy with Domenic Moras. He was started on Mirtazapine 7.5mg  at bedtime on last visit as recommended by Psychiatry.  Helping some Started 11/20 cbt This past week has had more; 4 , fell in kitchen and hurt right shoulder 12/2 started part time, 5 hrs After feels so foggy he had to finish work the next day Had to call coworkers a couple of times basic times can't recall where to find things Couple of them, feels like something stuck on throat , working and felt it, took a drink, sat on floor then woke up on the floor More in the daytime now Musles in neck/chest so tight after Week prior: 2, in sleep 2am Mirtaz not every night, feels like he is getting more sleep, when mind more racing, takes it and helps, 2-3 a week Xanax three times a week Postural, no action on right, slight on left    I had the pleasure of seeing Mark Mcgee in follow-up in the neurology clinic on 07/20/2023.  The patient was last seen 3 months ago for new onset seizures that started in November 2023. He is alone in the office today. Records and images were personally reviewed where available.  His ambulatory EEG captured an episode where his muscles felt sore after, there was no video but the EEG did not show any changes during push button events. He was admitted to the EMU from Aug 19-23, 2024 where Lamotrigine and Zonisamide were held. Baseline EEG was normal. There was one smaller episode captured where he has left index finger twitching followed by left arm twitching/stiffening. He  reports he felt the same constricting sensation in his throat prior. EEG normal during event. He has been on Lamotrigine, dose increased to 200mg  BID last month when he reported near daily seizures where he had injured himself (bruises, bloody nose, muscles sore). Since his last visit, he has been seen by psychiatrist Dr. 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 He does have big psychosocial stressors that could have precipitated the events which would fit the pattern. Dr. Adrian Blackwater recommended stopping his stimulant and Xanax.   Since his last communication, he reports continued episodes of loss of consciousness where he injures himself, most recently 07/17/23 at 1am. He was not feeling good and took Tylenol, lay on the couch, then woke up on the floor with a bloody nose. When he falls, he tends to hit the left side of his face. He does not remember what he was doing before, and takes a long time to get back to himself. He is very anxious and scared that he continues to have these recurrent episodes. He has stopped the Adderall and Xanax, he is not sleeping well. He states he is a bag of nerves. He states he cannot even concentrate to read. His work wants him to return tomorrow, however with current condition, he is unable to function effectively.    History on Initial Assessment 09/21/2022: This is a pleasant 57 year old right-handed man with a history of hypertension, hyperthyroidism, IBS, anxiety, tremor, presenting for evaluation of  new onset seizures. He denies any prior history of seizures until 08/21/22 when he had 3 seizures in one day. He was stressed out after putting his dog to sleep. He recalls driving home and pulling into the driveway but pulled in crooked. His right leg was slamming the break and he could not let go, he knew what was going on but could not do anything. He felt like he was stuck. His friend in the car with him was able to stop the car. He was able  to get out of the car and then his friend witnessed a generalized convulsion. He had shaking so bad it dented the side of his car with his arm. His friend lay him on his side, then he woke up and recalls getting up and walking around the car, then he felt like he was being choked then had another seizure. After this, he recalls getting up and going in the house then proceeded to have a third convulsion where he had urinary incontinence then vomited after. When EMS arrived, he was mildly confused with a hard time finding words. He recalls feeling very jittery, no focal weakness. He was brought to the ER where bloodwork showed a WBC of 15, creatinine 1.33. EKG NSR. Head CT no acute changes. UDS positive for opiates and THC. He is on hydrocodone for chronic jaw pain and using CBD gummies to help with sleep/anxiety. Since then, he has had at least 5 more episodes of loss of consciousness where he wakes up on the floor. Last episode was last night, he went to the mantel to fix something then woke up on the floor. There was one witnessed incident 2 weeks ago where his friend told him he was staring and unresponsive for 5 minutes. He gets flashes in his eyes before the episodes, then when he wakes up it feels like he went through a workout at the gym. His chest muscles still hurt. Since then, he has also had spasms where what he is holding in his hand goes flying out. He loses his train of thought a lot. Since the initial seizures, he could not recall how to do things at work that he had been doing for over 6 years (works with [employer]). He feels like his brain is scrambled. He has not prior history of headaches but since the seizures has had constant headaches with pressure on both side, 2/10 in intensity today, no nausea/vomiting. He is sensitive to lights. He has had loss of appetite. He has bilateral tinnitus, worse at night. Masking has not helped. HE is not sleeping well, getting 4-6 hours of sleep. Prior to the  seizures, he was taking alprazolam around 2 times a week, but since the seizures he has had a constant nervousness and worsening hand tremors and taking 1/4-1/2 tablet daily. His PCP started Levetiracetam 500mg  BID which is making him sleepy. He denies any dizziness, diplopia, neck/back pain, bowel/bladder dysfunction. He was born 3 months premature. 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 He had a normal early development.  There is no history of febrile convulsions, CNS infections such as meningitis/encephalitis, significant traumatic brain injury, neurosurgical procedures, or family history of seizures. His sister also has tremors.  Prior ASMs: Levetiracetam, oxcarbazepine, Zonisamide  Diagnostic Data: Brain MRI with and without contrast done 09/2022 no acute changes. There is moderately prominent enlargement of the atria and occipital horns of the right greater than left lateral ventricles with associated  surrounding white matter volume loss. One or two small foci of T2 FLAIR hyperintensity in the cerebral white matter are within normal limits for age.   Routine EEG 08/2022 normal  69-hour Ambulatory EEG 12/2022 normal. Push button events showed movement artifact, no epileptiform discharges seen. No video recorded.   Lumbar puncture 11/2022: mildly elevated CSF protein 56, normal cell count, glucose. Negative autoimmune panel.  PAST MEDICAL HISTORY: Past Medical History:  Diagnosis Date   Anxiety    Costochondritis    recurrent    Depression    Dr Dub Mikes   Depression    Dr Dub Mikes d/c  Dr Adrian Blackwater  Off work till December  2024  Off work till December  Per Dr Karel Jarvis:  " 57 yo RH man with a history of hypertension, hyperthyroidism, IBS, anxiety, tremor, with new onset seizures since 08/21/2022. He has nocturnal convulsions and staring episodes. 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        Prostatitis    Very painful flare-ups Dr Retta Diones   Tremor     MEDICATIONS: Current Outpatient Medications on File Prior to Visit  Medication Sig Dispense Refill   acetaminophen (TYLENOL) 325 MG tablet Take 325-650 mg by mouth every 6 (six) hours as needed for mild pain or headache.      acyclovir (ZOVIRAX) 400 MG tablet Take 1 tablet (400 mg total) by mouth 4 (four) times daily as needed (as directed for outbreaks). 60 tablet 3   albuterol (VENTOLIN HFA) 108 (90 Base) MCG/ACT inhaler Inhale 2 puffs into the lungs every 4 (four) hours as needed for wheezing or shortness of breath. 18 g 5   alprazolam (XANAX) 2 MG tablet Take 0.5 tablets (1 mg total) by mouth 4 (four) times daily as needed for sleep or anxiety. 120 tablet 1   amLODipine-olmesartan (AZOR) 10-40 MG tablet Take 1 tablet by mouth daily. 90 tablet 3   amphetamine-dextroamphetamine (ADDERALL) 10 MG tablet Take 1 tablet (10 mg total) by mouth 2 (two) times daily with breakfast and lunch. 60 tablet 0   aspirin EC 81 MG tablet Take 1 tablet (81 mg total) by mouth daily. 100 tablet 3   b complex vitamins tablet Take 1 tablet by mouth daily.     Cholecalciferol (VITAMIN D3) 50 MCG (2000 UT) capsule Take 1 capsule (2,000 Units total) by mouth daily. 100 capsule 3   cyanocobalamin (VITAMIN B12) 1000 MCG/ML injection Inject 1 mL (1,000 mcg total) into the skin daily x 1 week, then once weekly for 1 month, then once every 2 weeks 10 mL 6   diazePAM, 20 MG Dose, (VALTOCO 20 MG DOSE) 2 x 10  MG/0.1ML LQPK Spray in nose as instructed for seizure. May use second dose after 4 hours. 4 each 5   diphenoxylate-atropine (LOMOTIL) 2.5-0.025 MG tablet TAKE 1 OR 2 TABLETS BY MOUTH 4 TIMES DAILY AS NEEDED FOR  DIARRHEA OR LOOSE STOOLS MAX OF 8 TABLETS PER DAY 60 tablet 3   Flaxseed, Linseed, (FLAX SEEDS) POWD 2 (two) times daily as needed (as directed when mixing into health shakes).     Fluticasone-Umeclidin-Vilant (TRELEGY ELLIPTA) 100-62.5-25 MCG/ACT AEPB Inhale 1 puff into the lungs daily. 60 each 11   glycopyrrolate (ROBINUL) 2 MG tablet Take 1 tablet (2 mg total) by mouth 2 (two) times daily. 180 tablet 3   HYDROcodone-acetaminophen (NORCO) 10-325 MG tablet Take 1 tablet by mouth every 6 (six) hours as needed. 120 tablet 0   lamoTRIgine (LAMICTAL) 200 MG tablet Take 1 tablet (200 mg total) by mouth 2 (two) times daily. 180 tablet 3   loperamide (IMODIUM) 2 MG capsule Take 2 mg by mouth as needed for diarrhea or loose stools.     mirtazapine (REMERON) 7.5 MG tablet Take 1 tablet (7.5 mg total) by mouth at bedtime. 30 tablet 5   Multiple Vitamin (MULTIVITAMIN) capsule Take 1 capsule by mouth daily.      Oyster Shell (OYSTER CALCIUM) 500 MG TABS tablet Take 500 mg of elemental calcium by mouth daily.     SYRINGE-NEEDLE, DISP, 3 ML 22G X 1-1/2" 3 ML MISC Use IM as directed 50 each 3   tadalafil (CIALIS) 5 MG tablet Take 1 tablet (5 mg total) by mouth daily. 90 tablet 3   testosterone cypionate (DEPOTESTOSTERONE CYPIONATE) 200 MG/ML injection Inject 1 mL (200 mg total) into the muscle every 14 (fourteen) days. 10 mL 5   zinc gluconate 50 MG tablet Take 100 mg by mouth daily.     No current facility-administered medications on file prior to visit.    ALLERGIES: Allergies  Allergen Reactions   Buspirone Hcl Other (See Comments)    Reaction not recalled by the patient   Divalproex Sodium Other (See Comments)    Severe headaches   Gluten Meal Other (See Comments)   Olanzapine-Fluoxetine Hcl  Other (See Comments)    Reaction not recalled by the patient   Other Other (See Comments)   Piroxicam Other (See Comments)    Reaction not recalled by the patient   Wheat Diarrhea and Other (See Comments)    "Tears up my stomach"   Ibuprofen Rash and Other (See Comments)    Spots appear on chest appear    FAMILY HISTORY: Family History  Problem Relation Age of Onset   Hyperlipidemia Other    Coronary artery disease Other    Stroke Mother    Colon cancer Paternal Grandmother    Diabetes Other    Breast cancer Other    Pancreatic cancer Other    Cancer Other        breast, pancreatic   Heart disease Other    Cancer Maternal Grandmother        colon    SOCIAL HISTORY: Social History   Socioeconomic History   Marital status: Single    Spouse name: Not on file   Number of children: 0   Years of education: 18   Highest education level: Not on file  Occupational History   Occupation: Teacher, adult education: [employer]  Tobacco Use   Smoking status: Never   Smokeless tobacco: Never  Vaping Use   Vaping status: Never Used  Substance and Sexual Activity   Alcohol use: Not Currently    Comment: None currently.  Previously 2-6 wine every 2/wks   Drug use: Not Currently    Comment: Previous CBD oil use   Sexual  activity: Yes  Other Topics Concern   Not on file  Social History Narrative   Are you right handed or left handed? right   Are you currently employed ? 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 no   Who lives with you? Room mate   What type of home do you live in: 1 story or 2 story? 1.5 story        Social Drivers of Corporate investment banker Strain: Not on file  Food Insecurity: No Food Insecurity (05/16/2023)   Hunger Vital Sign    Worried About Running Out of Food in the Last Year: Never true    Ran Out of Food in the Last Year: Never true  Transportation Needs: No Transportation Needs (05/16/2023)    PRAPARE - Administrator, Civil Service (Medical): No    Lack of Transportation (Non-Medical): No  Physical Activity: Not on file  Stress: Not on file  Social Connections: Not on file  Intimate Partner Violence: Not At Risk (05/16/2023)   Humiliation, Afraid, Rape, and Kick questionnaire    Fear of Current or Ex-Partner: No    Emotionally Abused: No    Physically Abused: No    Sexually Abused: No     PHYSICAL EXAM: Vitals:   09/15/23 1032  BP: 124/76  Pulse: 79  SpO2: 99%   General: No acute distress Head:  Normocephalic/atraumatic Skin/Extremities: No rash, no edema Neurological Exam: alert and oriented to person, place, and time. No aphasia or dysarthria. Fund of knowledge is appropriate.  Recent and remote memory are intact.  Attention and concentration are normal.   Cranial nerves: Pupils equal, round. Extraocular movements intact with no nystagmus. Visual fields full.  No facial asymmetry.  Motor: Bulk and tone normal, muscle strength 5/5 throughout with no pronator drift.   Finger to nose testing intact.  Gait narrow-based and steady, able to tandem walk adequately.  Romberg negative.   IMPRESSION: This is a pleasant 57 yo RH man with a history of hypertension, hyperthyroidism, IBS, anxiety, tremor, with new onset seizures since 08/21/2022. He has been having convulsions and staring episodes. MRI brain no acute changes however there is note of moderate prominent enlargement of the atria and occipital horns of the right greater than left lateral ventricles, hippocampi symmetric. His ambulatory EEG captured seizures with no EEG correlate, then he was admitted to the EMU in August 2024 for 5 days where medications were held and he had a smaller episode of throat constriction, left arm stiffening/twitching that  did not show EEG correlate. Episodes of convulsive activity and loss of consciousness were not captured. He continues to report these recurrent episodes where he is  losing consciousness and injuring himself. We discussed doing a holter monitor to assess for other causes of loss of consciousness. I feel he would benefit from more prolonged video EEG monitoring to fully characterize these episodes and assess if he has coexisting non-epileptic events and possibly epilepsy. Continue Lamotrigine 200mg  BID. 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 He has stopped Xanax and Adderall as recommended by Psychiatry but continues to note insomnia. Remeron was recommended, he is agreeable to starting low dose 7.5mg  at bedtime, side effects discussed, uptitrate as tolerated. He remains unable to return to work at this time. He is aware of [place]  driving laws to stop driving after an episode of loss of consciousness until 6 months event-free. Follow-up in 2 months, call for any changes.     Thank you for allowing me to participate in *** care.  Please do not hesitate to call for any questions or concerns.  The duration of this appointment visit was *** minutes of face-to-face time with the patient.  Greater than 50% of this time was spent in counseling, explanation of diagnosis, planning of further management, and coordination of care.   Patrcia Dolly, M.D.   CC: ***

## 2023-09-15 NOTE — Patient Instructions (Signed)
Good to see you.  Start Gabapentin 300mg : take 1 capsule every night  2. Continue Lamotrigine 200mg  twice a day  3. Schedule inpatient video EEG monitoring  4. Continue CBT  5. Continue all your other medications  6. Proceed with Neuropsychological evaluation  7. Follow-up in after testing, call for any changes

## 2023-10-10 ENCOUNTER — Other Ambulatory Visit: Payer: Self-pay | Admitting: Internal Medicine

## 2023-10-11 ENCOUNTER — Other Ambulatory Visit (HOSPITAL_COMMUNITY): Payer: Self-pay

## 2023-10-11 ENCOUNTER — Other Ambulatory Visit: Payer: Self-pay

## 2023-10-11 MED ORDER — HYDROCODONE-ACETAMINOPHEN 10-325 MG PO TABS
1.0000 | ORAL_TABLET | Freq: Four times a day (QID) | ORAL | 0 refills | Status: DC | PRN
  Filled 2023-10-11: qty 120, 30d supply, fill #0

## 2023-10-12 ENCOUNTER — Other Ambulatory Visit: Payer: Self-pay

## 2023-10-24 ENCOUNTER — Other Ambulatory Visit (HOSPITAL_COMMUNITY): Payer: Self-pay

## 2023-10-25 ENCOUNTER — Institutional Professional Consult (permissible substitution): Payer: 59 | Admitting: Psychology

## 2023-10-25 ENCOUNTER — Ambulatory Visit: Payer: Self-pay

## 2023-10-28 ENCOUNTER — Other Ambulatory Visit: Payer: Self-pay

## 2023-10-28 DIAGNOSIS — G40009 Localization-related (focal) (partial) idiopathic epilepsy and epileptic syndromes with seizures of localized onset, not intractable, without status epilepticus: Secondary | ICD-10-CM

## 2023-11-01 ENCOUNTER — Encounter: Payer: Self-pay | Admitting: Neurology

## 2023-11-02 ENCOUNTER — Encounter: Payer: 59 | Admitting: Psychology

## 2023-11-03 ENCOUNTER — Ambulatory Visit: Payer: Managed Care, Other (non HMO) | Admitting: Neurology

## 2023-11-07 ENCOUNTER — Other Ambulatory Visit: Payer: Self-pay | Admitting: Internal Medicine

## 2023-11-08 ENCOUNTER — Other Ambulatory Visit: Payer: Self-pay

## 2023-11-10 ENCOUNTER — Other Ambulatory Visit (HOSPITAL_COMMUNITY): Payer: Self-pay

## 2023-11-10 ENCOUNTER — Encounter: Payer: Self-pay | Admitting: Pharmacist

## 2023-11-10 ENCOUNTER — Other Ambulatory Visit: Payer: Self-pay

## 2023-11-10 MED ORDER — HYDROCODONE-ACETAMINOPHEN 10-325 MG PO TABS
1.0000 | ORAL_TABLET | Freq: Four times a day (QID) | ORAL | 0 refills | Status: DC | PRN
  Filled 2023-11-10 – 2023-12-05 (×2): qty 120, 30d supply, fill #0

## 2023-11-10 MED ORDER — AMPHETAMINE-DEXTROAMPHETAMINE 10 MG PO TABS
10.0000 mg | ORAL_TABLET | Freq: Two times a day (BID) | ORAL | 0 refills | Status: DC
  Filled 2023-11-10 – 2023-12-05 (×2): qty 60, 30d supply, fill #0

## 2023-11-15 ENCOUNTER — Other Ambulatory Visit (HOSPITAL_COMMUNITY): Payer: Self-pay

## 2023-11-15 ENCOUNTER — Other Ambulatory Visit: Payer: Self-pay

## 2023-11-20 ENCOUNTER — Encounter: Payer: Self-pay | Admitting: Internal Medicine

## 2023-11-29 ENCOUNTER — Other Ambulatory Visit: Payer: Self-pay

## 2023-11-29 ENCOUNTER — Encounter: Payer: Self-pay | Admitting: Neurology

## 2023-11-29 ENCOUNTER — Other Ambulatory Visit (HOSPITAL_COMMUNITY): Payer: Self-pay

## 2023-11-29 DIAGNOSIS — G40009 Localization-related (focal) (partial) idiopathic epilepsy and epileptic syndromes with seizures of localized onset, not intractable, without status epilepticus: Secondary | ICD-10-CM

## 2023-11-29 DIAGNOSIS — R569 Unspecified convulsions: Secondary | ICD-10-CM

## 2023-11-29 MED ORDER — LAMOTRIGINE 200 MG PO TABS
ORAL_TABLET | ORAL | 3 refills | Status: DC
  Filled 2023-11-29: qty 135, 90d supply, fill #0
  Filled 2023-11-29: qty 225, 90d supply, fill #0

## 2023-11-30 ENCOUNTER — Other Ambulatory Visit (HOSPITAL_COMMUNITY): Payer: Self-pay

## 2023-11-30 ENCOUNTER — Other Ambulatory Visit: Payer: Self-pay

## 2023-12-05 ENCOUNTER — Encounter: Payer: Self-pay | Admitting: Neurology

## 2023-12-06 ENCOUNTER — Other Ambulatory Visit (HOSPITAL_COMMUNITY): Payer: Self-pay

## 2023-12-06 ENCOUNTER — Other Ambulatory Visit: Payer: Self-pay

## 2023-12-23 ENCOUNTER — Ambulatory Visit: Admitting: Neurology

## 2023-12-23 DIAGNOSIS — R569 Unspecified convulsions: Secondary | ICD-10-CM | POA: Diagnosis not present

## 2023-12-23 DIAGNOSIS — G40009 Localization-related (focal) (partial) idiopathic epilepsy and epileptic syndromes with seizures of localized onset, not intractable, without status epilepticus: Secondary | ICD-10-CM | POA: Diagnosis not present

## 2023-12-23 NOTE — Progress Notes (Signed)
 Ambulatory EEG hooked up and running. Light flashing. Push button tested. Camera and event log explained. Batteries explained. Patient understood.

## 2023-12-29 ENCOUNTER — Encounter: Payer: Self-pay | Admitting: Neurology

## 2024-01-04 ENCOUNTER — Other Ambulatory Visit: Payer: Self-pay

## 2024-01-04 ENCOUNTER — Other Ambulatory Visit: Payer: Self-pay | Admitting: Internal Medicine

## 2024-01-04 ENCOUNTER — Other Ambulatory Visit (HOSPITAL_COMMUNITY): Payer: Self-pay

## 2024-01-04 MED ORDER — AMPHETAMINE-DEXTROAMPHETAMINE 10 MG PO TABS
10.0000 mg | ORAL_TABLET | Freq: Two times a day (BID) | ORAL | 0 refills | Status: DC
  Filled 2024-01-04: qty 60, 30d supply, fill #0

## 2024-01-10 ENCOUNTER — Encounter: Payer: Self-pay | Admitting: Neurology

## 2024-01-11 ENCOUNTER — Telehealth: Payer: Self-pay

## 2024-01-11 NOTE — Telephone Encounter (Signed)
 Pt called no answer left a voice mail that letter requested was ready and would be at the front for him to pick up

## 2024-01-25 ENCOUNTER — Telehealth: Admitting: Neurology

## 2024-01-25 ENCOUNTER — Encounter: Payer: Self-pay | Admitting: Neurology

## 2024-01-25 ENCOUNTER — Other Ambulatory Visit (HOSPITAL_COMMUNITY): Payer: Self-pay

## 2024-01-25 VITALS — Ht 67.0 in | Wt 220.0 lb

## 2024-01-25 DIAGNOSIS — R569 Unspecified convulsions: Secondary | ICD-10-CM

## 2024-01-25 DIAGNOSIS — R251 Tremor, unspecified: Secondary | ICD-10-CM

## 2024-01-25 MED ORDER — LAMOTRIGINE 200 MG PO TABS
ORAL_TABLET | ORAL | 3 refills | Status: DC
  Filled 2024-01-25: qty 225, 90d supply, fill #0

## 2024-01-25 MED ORDER — GABAPENTIN 300 MG PO CAPS
600.0000 mg | ORAL_CAPSULE | Freq: Every evening | ORAL | 6 refills | Status: DC
Start: 2024-01-25 — End: 2024-05-03
  Filled 2024-01-25: qty 60, 30d supply, fill #0

## 2024-01-25 NOTE — Patient Instructions (Addendum)
 Hi Mark Mcgee, let's do the following as discussed:  Schedule MRI brain with and without contrast  2. Schedule Neurocognitive testing  3. Increase Gabapentin  300mg : Take 2 capsules every night  4. Please call Dr. Micheal Agent office for a follow-up to help better treat the depression and anxiety. His office number is [phone]  5. Continue Lamotrigine  200mg  in AM, 300mg  in PM  6. Continue working with [employer]  7. Follow-up in 3 months, call for any changes

## 2024-01-25 NOTE — Progress Notes (Signed)
 Virtual Visit via Video Note The purpose of this virtual visit is to provide medical care while limiting exposure to the novel coronavirus.    Consent was obtained for video visit:  Yes.   Answered questions that patient had about telehealth interaction:  Yes.    Pt location: Home Physician Location: office Name of referring provider:  Plotnikov, Oakley Bellman, MD I connected with Lucianne Rutherford at patients initiation/request on 01/25/2024 at  3:00 PM EDT by video enabled telemedicine application and verified that I am speaking with the correct person using two identifiers. Pt MRN:  [id number] Pt DOB:  March 23, 1966 Video Participants:  Lucianne Rutherford   History of Present Illness:  The patient had a virtual video visit on 01/25/2024. He was last seen in the neurology clinic 4 months ago for seizures that started in 07/2022. EMU monitoring in 04/2023 was normal, there was a mild episode with left arm twitching with no EEG correlate. He continued to report frequent seizures where he would fall and hurt himself, as well as more cognitive changes. I recommended another EMU admission to capture the bigger episodes, however due to his work, he could not do inpatient monitoring and repeated another ambulatory EEG however again there was no video because camera was left in his aunt's car. I reviewed 47-hour EEG done 11/2023 which was normal. He did  not fill out diary but pushed button several times. Two push button events had muscle/movement artifact that appeared somewhat rhythmic, there were no epileptiform changes seen with them. He reports his left side is always stiff after these episodes, like he lifted a ton of weights. He is on Lamotrigine  200mg  in AM, 300mg  in PM and Gabapentin  300mg  at bedtime, and reported on 4/3 that the episodes seemed to decreased to about 2-3 a week but memory has been getting worse after each episode, progressively affecting his work. He is tearful today and states he is tired of  it all. He continues to do CBT with Katharina Palin but feels frustrated with lack of improvement in symptoms, "I don't understand why these things keep happening." He reports 5 seizures in the past 10 days. The main thing bothering him is inability to recall work details which he has been doing for the past 5 years. He is falling behind at work and having to rely on coworkers to remind him where things are. He has to have books next to him because after work he has to relearn things. He has tried reading but finds he is just staring at the book and cannot get through it.   He feels the tremors are not as bad when trying to right, he still has slight shakiness. He is sleeping better and does not take Mirtazapine  every night because it gives him vivid dreams. He reports weight gain. He occasionally takes Xanax , last intake was 5 days ago after he had 2 seizures in a row. It was just horrible, he could not sleep/relax after, Xanax  helped.    History on Initial Assessment 09/21/2022: This is a pleasant 58 year old right-handed man with a history of hypertension, hyperthyroidism, IBS, anxiety, tremor, presenting for evaluation of new onset seizures. He denies any prior history of seizures until 08/21/22 when he had 3 seizures in one day. He was stressed out after putting his dog to sleep. He recalls driving home and pulling into the driveway but pulled in crooked. His right leg was slamming the break and he could not let go,  he knew what was going on but could not do anything. He felt like he was stuck. His friend in the car with him was able to stop the car. He was able to get out of the car and then his friend witnessed a generalized convulsion. He had shaking so bad it dented the side of his car with his arm. His friend lay him on his side, then he woke up and recalls getting up and walking around the car, then he felt like he was being choked then had another seizure. After this, he recalls getting up and going  in the house then proceeded to have a third convulsion where he had urinary incontinence then vomited after. When EMS arrived, he was mildly confused with a hard time finding words. He recalls feeling very jittery, no focal weakness. He was brought to the ER where bloodwork showed a WBC of 15, creatinine 1.33. EKG NSR. Head CT no acute changes. UDS positive for opiates and THC. He is on hydrocodone  for chronic jaw pain and using CBD gummies to help with sleep/anxiety. Since then, he has had at least 5 more episodes of loss of consciousness where he wakes up on the floor. Last episode was last night, he went to the mantel to fix something then woke up on the floor. There was one witnessed incident 2 weeks ago where his friend told him he was staring and unresponsive for 5 minutes. He gets flashes in his eyes before the episodes, then when he wakes up it feels like he went through a workout at the gym. His chest muscles still hurt. Since then, he has also had spasms where what he is holding in his hand goes flying out. He loses his train of thought a lot. Since the initial seizures, he could not recall how to do things at work that he had been doing for over 6 years (works with [employer]). He feels like his brain is scrambled. He has not prior history of headaches but since the seizures has had constant headaches with pressure on both side, 2/10 in intensity today, no nausea/vomiting. He is sensitive to lights. He has had loss of appetite. He has bilateral tinnitus, worse at night. Masking has not helped. HE is not sleeping well, getting 4-6 hours of sleep. Prior to the seizures, he was taking alprazolam  around 2 times a week, but since the seizures he has had a constant nervousness and worsening hand tremors and taking 1/4-1/2 tablet daily. His PCP started Levetiracetam  500mg  BID which is making him sleepy. He denies any dizziness, diplopia, neck/back pain, bowel/bladder dysfunction. He was born 3 months premature. 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 He had a normal early development.  There is no history of febrile convulsions, CNS infections such as meningitis/encephalitis, significant traumatic brain injury, neurosurgical procedures, or family history of seizures. His sister also has tremors.  Prior ASMs: Levetiracetam , oxcarbazepine , Zonisamide   Diagnostic Data: Brain MRI with and without contrast done 09/2022 no acute changes. There is moderately prominent enlargement of the atria and occipital horns of the right greater than left lateral ventricles with associated surrounding white matter volume loss. One or two small foci of T2 FLAIR hyperintensity in the cerebral white matter are within normal limits for age.   Routine EEG 08/2022 normal  69-hour Ambulatory EEG 12/2022 normal. Push button events showed movement artifact, no epileptiform discharges seen. No video recorded.   EMU monitoring 8/12-23/2024 showed  normal baseline EEG. There was 1 push button event for left index finger twitching followed by left arm twitching with no EEG correlate.  Lumbar puncture 11/2022: mildly elevated CSF protein 56, normal cell count, glucose. Negative autoimmune panel.   Current Outpatient Medications on File Prior to Visit  Medication Sig Dispense Refill   acetaminophen  (TYLENOL ) 325 MG tablet Take 325-650 mg by mouth every 6 (six) hours as needed for mild pain or headache.      acyclovir  (ZOVIRAX ) 400 MG tablet Take 1 tablet (400 mg total) by mouth 4 (four) times daily as needed (as directed for outbreaks). (Patient not taking: Reported on 01/25/2024) 60 tablet 3   albuterol  (VENTOLIN  HFA) 108 (90 Base) MCG/ACT inhaler Inhale 2 puffs into the lungs every 4 (four) hours as needed for wheezing or shortness of breath. 18 g 5   alprazolam  (XANAX ) 2 MG tablet Take 0.5 tablets (1 mg total) by mouth 4 (four) times daily as needed for sleep or anxiety. 120 tablet 1   amLODipine -olmesartan  (AZOR ) 10-40  MG tablet Take 1 tablet by mouth daily. 90 tablet 3   amphetamine -dextroamphetamine  (ADDERALL) 10 MG tablet Take 1 tablet (10 mg total) by mouth 2 (two) times daily with breakfast and lunch. 60 tablet 0   b complex vitamins tablet Take 1 tablet by mouth daily.     Cholecalciferol  (VITAMIN D3) 50 MCG (2000 UT) capsule Take 1 capsule (2,000 Units total) by mouth daily. 100 capsule 3   cyanocobalamin  (VITAMIN B12) 1000 MCG/ML injection Inject 1 mL (1,000 mcg total) into the skin daily x 1 week, then once weekly for 1 month, then once every 2 weeks 10 mL 6   diazePAM , 20 MG Dose, (VALTOCO  20 MG DOSE) 2 x 10 MG/0.1ML LQPK Spray in nose as instructed for seizure. May use second dose after 4 hours. 4 each 5   diphenoxylate -atropine  (LOMOTIL ) 2.5-0.025 MG tablet TAKE 1 OR 2 TABLETS BY MOUTH 4 TIMES DAILY AS NEEDED FOR DIARRHEA OR LOOSE STOOLS MAX OF 8 TABLETS PER DAY 60 tablet 3   Flaxseed, Linseed, (FLAX SEEDS) POWD 2 (two) times daily as needed (as directed when mixing into health shakes).     Fluticasone -Umeclidin-Vilant (TRELEGY ELLIPTA ) 100-62.5-25 MCG/ACT AEPB Inhale 1 puff into the lungs daily. 60 each 11   gabapentin  (NEURONTIN ) 300 MG capsule Take 1 capsule (300 mg total) by mouth at bedtime. 30 capsule 5   glycopyrrolate  (ROBINUL ) 2 MG tablet Take 1 tablet (2 mg total) by mouth 2 (two) times daily. 180 tablet 3   HYDROcodone -acetaminophen  (NORCO) 10-325 MG tablet Take 1 tablet by mouth every 6 (six) hours as needed. 120 tablet 0   lamoTRIgine  (LAMICTAL ) 200 MG tablet Take 1 tablet (200 mg total) by mouth in the morning AND 1.5 tablets (300 mg total) every evening. 225 tablet 3   loperamide  (IMODIUM ) 2 MG capsule Take 2 mg by mouth as needed for diarrhea or loose stools.     mirtazapine  (REMERON ) 7.5 MG tablet Take 1 tablet (7.5 mg total) by mouth at bedtime. 30 tablet 5   Multiple Vitamin (MULTIVITAMIN) capsule Take 1 capsule by mouth daily.      Oyster Shell (OYSTER CALCIUM) 500 MG TABS tablet  Take 500 mg of elemental calcium by mouth daily.     SYRINGE-NEEDLE, DISP, 3 ML 22G X 1-1/2" 3 ML MISC Use IM as directed 50 each 3   tadalafil  (CIALIS ) 5 MG tablet Take 1 tablet (5 mg total) by mouth daily. 90 tablet 3  testosterone  cypionate (DEPOTESTOSTERONE CYPIONATE) 200 MG/ML injection Inject 1 mL (200 mg total) into the muscle every 14 (fourteen) days. 10 mL 5   zinc  gluconate 50 MG tablet Take 100 mg by mouth daily.     No current facility-administered medications on file prior to visit.     Observations/Objective:   Vitals:   01/25/24 1506  Weight: 220 lb (99.8 kg)  Height: 5\' 7"  (1.702 m)   GEN:  The patient appears stated age and is in NAD. Tearful and depressed. Neurological examination: Patient is awake, alert. No aphasia or dysarthria. Intact fluency and comprehension. Cranial nerves: Extraocular movements intact. No facial asymmetry. Motor: moves all extremities symmetrically, at least anti-gravity x 4.    Assessment and Plan:   This is a pleasant 58 yo RH man with a history of hypertension, hyperthyroidism, IBS, anxiety, tremor, with new onset seizures since 08/21/2022. He has been having convulsions and staring episodes. MRI brain 09/2022 no acute changes however there is note of moderate prominent enlargement of the atria and occipital horns of the right greater than left lateral ventricles, hippocampi symmetric. His ambulatory EEG in 12/2022 captured seizures with no EEG correlate, then he was admitted to the EMU in August 2024 for 5 days where medications were held and he had a smaller episode of throat constriction, left arm stiffening/twitching that  did not show EEG correlate. Episodes of convulsive activity and loss of consciousness were not captured. We repeated an ambulatory EEG but again there was no video for review of the shaking episodes that again did not show epileptiform correlate. He would still benefit from another EMU admission to characterize the bigger shaking  episodes, video is key. Main concern today is worsening cognition now affecting work. Repeat MRI brain with and without contrast will be ordered, he was advised to proceed with previously discussed Neurocognitive testing to also assess his psychological profile. He was encouraged to resume visit with psychiatrist Dr. Cathyann Cobia for medication management of depression and anxiety. In the meantime, we discussed increasing Gabapentin  to 600mg  at bedtime, continue Lamotrigine  200mg  in AM, 300mg  in PM. Continue CBT. He does not drive. Follow-up in 3 months, call for any changes.    Follow Up Instructions:    -I discussed the assessment and treatment plan with the patient. The patient was provided an opportunity to ask questions and all were answered. The patient agreed with the plan and demonstrated an understanding of the instructions.   The patient was advised to call back or seek an in-person evaluation if the symptoms worsen or if the condition fails to improve as anticipated.    Jhonny Moss, MD

## 2024-01-26 ENCOUNTER — Other Ambulatory Visit: Payer: Self-pay

## 2024-01-26 ENCOUNTER — Other Ambulatory Visit (HOSPITAL_COMMUNITY): Payer: Self-pay

## 2024-02-01 ENCOUNTER — Ambulatory Visit: Admitting: Internal Medicine

## 2024-02-01 ENCOUNTER — Encounter: Payer: Self-pay | Admitting: Internal Medicine

## 2024-02-01 VITALS — BP 108/70 | HR 64 | Temp 97.9°F | Ht 67.0 in | Wt 231.4 lb

## 2024-02-01 DIAGNOSIS — I10 Essential (primary) hypertension: Secondary | ICD-10-CM

## 2024-02-01 DIAGNOSIS — Z113 Encounter for screening for infections with a predominantly sexual mode of transmission: Secondary | ICD-10-CM

## 2024-02-01 DIAGNOSIS — N6342 Unspecified lump in left breast, subareolar: Secondary | ICD-10-CM

## 2024-02-01 DIAGNOSIS — M5440 Lumbago with sciatica, unspecified side: Secondary | ICD-10-CM

## 2024-02-01 DIAGNOSIS — F445 Conversion disorder with seizures or convulsions: Secondary | ICD-10-CM | POA: Diagnosis not present

## 2024-02-01 DIAGNOSIS — G8929 Other chronic pain: Secondary | ICD-10-CM

## 2024-02-01 DIAGNOSIS — E291 Testicular hypofunction: Secondary | ICD-10-CM

## 2024-02-01 DIAGNOSIS — F411 Generalized anxiety disorder: Secondary | ICD-10-CM | POA: Diagnosis not present

## 2024-02-01 DIAGNOSIS — F410 Panic disorder [episodic paroxysmal anxiety] without agoraphobia: Secondary | ICD-10-CM

## 2024-02-01 DIAGNOSIS — E538 Deficiency of other specified B group vitamins: Secondary | ICD-10-CM

## 2024-02-01 NOTE — Assessment & Plan Note (Addendum)
 On Gabapentin  300mg  at bedtime to help with tremor (and anxiety). Continue Lamotrigine  200mg  BID.  Video EEG, MRI pending NOt driving

## 2024-02-01 NOTE — Assessment & Plan Note (Signed)
Start on SQ B12

## 2024-02-01 NOTE — Assessment & Plan Note (Signed)
 On Lamictal F/u w/ Dr Karel Jarvis Cognitive therapy

## 2024-02-01 NOTE — Assessment & Plan Note (Signed)
Chronic On Azor

## 2024-02-01 NOTE — Progress Notes (Signed)
 Subjective:  Patient ID: Mark Mcgee, male    DOB: 01-Mar-1966  Age: 58 y.o. MRN: [id number]  CC: Medical Management of Chronic Issues (3 Month follow up. Patient notes lump in left pectoral (under nipple). Estimate half inch by half inch. Neighboring lymph nodes are tender. Noticed within the last month and a half)   HPI SHUBH REYNAUD presents for  L breast enlagement  Outpatient Medications Prior to Visit  Medication Sig Dispense Refill   acetaminophen  (TYLENOL ) 325 MG tablet Take 325-650 mg by mouth every 6 (six) hours as needed for mild pain or headache.      albuterol  (VENTOLIN  HFA) 108 (90 Base) MCG/ACT inhaler Inhale 2 puffs into the lungs every 4 (four) hours as needed for wheezing or shortness of breath. 18 g 5   alprazolam  (XANAX ) 2 MG tablet Take 0.5 tablets (1 mg total) by mouth 4 (four) times daily as needed for sleep or anxiety. 120 tablet 1   amLODipine -olmesartan  (AZOR ) 10-40 MG tablet Take 1 tablet by mouth daily. 90 tablet 3   amphetamine -dextroamphetamine  (ADDERALL) 10 MG tablet Take 1 tablet (10 mg total) by mouth 2 (two) times daily with breakfast and lunch. 60 tablet 0   b complex vitamins tablet Take 1 tablet by mouth daily.     Cholecalciferol  (VITAMIN D3) 50 MCG (2000 UT) capsule Take 1 capsule (2,000 Units total) by mouth daily. 100 capsule 3   cyanocobalamin  (VITAMIN B12) 1000 MCG/ML injection Inject 1 mL (1,000 mcg total) into the skin daily x 1 week, then once weekly for 1 month, then once every 2 weeks 10 mL 6   diazePAM , 20 MG Dose, (VALTOCO  20 MG DOSE) 2 x 10 MG/0.1ML LQPK Spray in nose as instructed for seizure. May use second dose after 4 hours. 4 each 5   diphenoxylate -atropine  (LOMOTIL ) 2.5-0.025 MG tablet TAKE 1 OR 2 TABLETS BY MOUTH 4 TIMES DAILY AS NEEDED FOR DIARRHEA OR LOOSE STOOLS MAX OF 8 TABLETS PER DAY 60 tablet 3   Flaxseed, Linseed, (FLAX SEEDS) POWD 2 (two) times daily as needed (as directed when mixing into health shakes).      Fluticasone -Umeclidin-Vilant (TRELEGY ELLIPTA ) 100-62.5-25 MCG/ACT AEPB Inhale 1 puff into the lungs daily. 60 each 11   gabapentin  (NEURONTIN ) 300 MG capsule Take 2 capsules (600 mg total) by mouth at bedtime. 60 capsule 6   glycopyrrolate  (ROBINUL ) 2 MG tablet Take 1 tablet (2 mg total) by mouth 2 (two) times daily. 180 tablet 3   HYDROcodone -acetaminophen  (NORCO) 10-325 MG tablet Take 1 tablet by mouth every 6 (six) hours as needed. 120 tablet 0   lamoTRIgine  (LAMICTAL ) 200 MG tablet Take 1 tablet (200 mg total) by mouth in the morning AND 1.5 tablets (300 mg total) every evening. 225 tablet 3   loperamide  (IMODIUM ) 2 MG capsule Take 2 mg by mouth as needed for diarrhea or loose stools.     mirtazapine  (REMERON ) 7.5 MG tablet Take 1 tablet (7.5 mg total) by mouth at bedtime. 30 tablet 5   Multiple Vitamin (MULTIVITAMIN) capsule Take 1 capsule by mouth daily.      Oyster Shell (OYSTER CALCIUM) 500 MG TABS tablet Take 500 mg of elemental calcium by mouth daily.     SYRINGE-NEEDLE, DISP, 3 ML 22G X 1-1/2" 3 ML MISC Use IM as directed 50 each 3   tadalafil  (CIALIS ) 5 MG tablet Take 1 tablet (5 mg total) by mouth daily. 90 tablet 3   testosterone  cypionate (DEPOTESTOSTERONE CYPIONATE) 200 MG/ML  injection Inject 1 mL (200 mg total) into the muscle every 14 (fourteen) days. 10 mL 5   zinc  gluconate 50 MG tablet Take 100 mg by mouth daily.     acyclovir  (ZOVIRAX ) 400 MG tablet Take 1 tablet (400 mg total) by mouth 4 (four) times daily as needed (as directed for outbreaks). (Patient not taking: Reported on 01/25/2024) 60 tablet 3   No facility-administered medications prior to visit.    ROS: Review of Systems  Constitutional:  Negative for appetite change, fatigue and unexpected weight change.  HENT:  Negative for congestion, nosebleeds, sneezing, sore throat and trouble swallowing.   Eyes:  Negative for itching and visual disturbance.  Respiratory:  Negative for cough.   Cardiovascular:  Negative  for chest pain, palpitations and leg swelling.  Gastrointestinal:  Negative for abdominal distention, blood in stool, diarrhea and nausea.  Genitourinary:  Negative for frequency and hematuria.  Musculoskeletal:  Negative for back pain, gait problem, joint swelling and neck pain.  Skin:  Negative for rash.  Neurological:  Negative for dizziness, tremors, speech difficulty and weakness.  Psychiatric/Behavioral:  Negative for agitation, dysphoric mood and sleep disturbance. The patient is not nervous/anxious.     Objective:  BP 108/70   Pulse 64   Temp 97.9 F (36.6 C)   Ht 5\' 7"  (1.702 m)   Wt 231 lb 6.4 oz (105 kg)   SpO2 97%   BMI 36.24 kg/m   BP Readings from Last 3 Encounters:  02/01/24 108/70  09/15/23 124/76  07/20/23 (!) 141/95    Wt Readings from Last 3 Encounters:  02/01/24 231 lb 6.4 oz (105 kg)  01/25/24 220 lb (99.8 kg)  09/15/23 217 lb 12.8 oz (98.8 kg)    Physical Exam Constitutional:      General: He is not in acute distress.    Appearance: He is well-developed.     Comments: NAD  Eyes:     Conjunctiva/sclera: Conjunctivae normal.     Pupils: Pupils are equal, round, and reactive to light.  Neck:     Thyroid : No thyromegaly.     Vascular: No JVD.  Cardiovascular:     Rate and Rhythm: Normal rate and regular rhythm.     Heart sounds: Normal heart sounds. No murmur heard.    No friction rub. No gallop.  Pulmonary:     Effort: Pulmonary effort is normal. No respiratory distress.     Breath sounds: Normal breath sounds. No wheezing or rales.  Chest:     Chest wall: No tenderness.  Abdominal:     General: Bowel sounds are normal. There is no distension.     Palpations: Abdomen is soft. There is no mass.     Tenderness: There is no abdominal tenderness. There is no guarding or rebound.  Musculoskeletal:        General: No tenderness. Normal range of motion.     Cervical back: Normal range of motion.  Lymphadenopathy:     Cervical: No cervical  adenopathy.  Skin:    General: Skin is warm and dry.     Findings: No rash.  Neurological:     Mental Status: He is alert and oriented to person, place, and time.     Cranial Nerves: No cranial nerve deficit.     Motor: No abnormal muscle tone.     Coordination: Coordination normal.     Gait: Gait normal.     Deep Tendon Reflexes: Reflexes are normal and symmetric.  Psychiatric:  Behavior: Behavior normal.        Thought Content: Thought content normal.        Judgment: Judgment normal.     Lab Results  Component Value Date   WBC 7.7 05/16/2023   HGB 11.8 (L) 05/16/2023   HCT 37.8 (L) 05/16/2023   PLT 249 05/16/2023   GLUCOSE 96 05/16/2023   CHOL 172 03/01/2023   TRIG 127.0 03/01/2023   HDL 46.80 03/01/2023   LDLDIRECT 91.0 12/04/2019   LDLCALC 99 03/01/2023   ALT 21 05/16/2023   AST 16 05/16/2023   NA 138 05/16/2023   K 4.2 05/16/2023   CL 106 05/16/2023   CREATININE 1.52 (H) 05/16/2023   BUN 21 (H) 05/16/2023   CO2 24 05/16/2023   TSH 1.78 03/01/2023   PSA 0.65 03/01/2023   INR 0.9 05/16/2023    Overnight EEG with video Result Date: 05/17/2023 Arleene Lack, MD     05/17/2023 12:04 PM Patient Name: SARIEL BEAS MRN: [id number] Epilepsy Attending: Arleene Lack Referring Physician/Provider: Arleene Lack, MD Duration: 05/16/2023 6578 to 05/17/2023 4696 Patient history: 58 year old male with seizure-like episodes getting eeg for  characterization of spells. Level of alertness: Awake, asleep AEDs during EEG study: LTG, ZNS Technical aspects: This EEG study was done with scalp electrodes positioned according to the 10-20 International system of electrode placement. Electrical activity was reviewed with band pass filter of 1-70Hz , sensitivity of 7 uV/mm, display speed of 60mm/sec with a 60Hz  notched filter applied as appropriate. EEG data were recorded continuously and digitally stored.  Video monitoring was available and reviewed as appropriate. Description:  The posterior dominant rhythm consists of 9-10 Hz activity of moderate voltage (25-35 uV) seen predominantly in posterior head regions, symmetric and reactive to eye opening and eye closing. Sleep was characterized by vertex waves, sleep spindles (12 to 14 Hz), maximal frontocentral region.  Event button was pressed on 05/17/2023 at 0254.  Patient was laying in bed.  Then had left index finger twitching followed by twitching of left arm.  Patient was in right lateral position facing away from camera therefore difficult to visualize but eyes did appear to be closed. Concomitant EEG before, during and after the event showed normal posterior dominant rhythm and did not show any EEG changes suggest seizure. Hyperventilation and photic stimulation were not performed.   IMPRESSION: This study is within normal limits. No seizures or epileptiform discharges were seen throughout the recording. Event button was pressed on 05/17/2023 at 0254 for event described as above without concomitant EEG change. This was a nonepileptic event. A normal interictal EEG does not exclude nor support the diagnosis of epilepsy. 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 Continue Lamotrigine  200mg  BID.  Video EEG, MRI pending NOt driving      HYPERTENSION - Primary   Chronic On Azor       B12 deficiency   Start on SQ B12         No orders of the defined types were placed in this encounter.     Follow-up: Return in about 3 months (around 05/03/2024) for a follow-up visit.  Anitra Barn, MD

## 2024-02-09 ENCOUNTER — Other Ambulatory Visit: Payer: Self-pay | Admitting: Family

## 2024-02-09 ENCOUNTER — Other Ambulatory Visit: Payer: Self-pay | Admitting: Internal Medicine

## 2024-02-10 ENCOUNTER — Other Ambulatory Visit: Payer: Self-pay

## 2024-02-10 ENCOUNTER — Other Ambulatory Visit (HOSPITAL_COMMUNITY): Payer: Self-pay

## 2024-02-11 ENCOUNTER — Other Ambulatory Visit (HOSPITAL_COMMUNITY): Payer: Self-pay

## 2024-02-13 ENCOUNTER — Other Ambulatory Visit (HOSPITAL_COMMUNITY): Payer: Self-pay

## 2024-02-13 MED ORDER — HYDROCODONE-ACETAMINOPHEN 10-325 MG PO TABS
1.0000 | ORAL_TABLET | Freq: Four times a day (QID) | ORAL | 0 refills | Status: DC | PRN
  Filled 2024-02-13: qty 120, 30d supply, fill #0

## 2024-02-14 ENCOUNTER — Telehealth: Payer: Self-pay | Admitting: Neurology

## 2024-02-14 ENCOUNTER — Other Ambulatory Visit: Payer: Self-pay

## 2024-02-14 DIAGNOSIS — R569 Unspecified convulsions: Secondary | ICD-10-CM

## 2024-02-14 DIAGNOSIS — G40009 Localization-related (focal) (partial) idiopathic epilepsy and epileptic syndromes with seizures of localized onset, not intractable, without status epilepticus: Secondary | ICD-10-CM

## 2024-02-14 NOTE — Telephone Encounter (Signed)
 Pt called informed that MRI has been ordered that they will get PA then call him to get it scheduled

## 2024-02-14 NOTE — Telephone Encounter (Signed)
 Pt called in stating he called [facility] and they did not have a referral for his MRI

## 2024-02-17 ENCOUNTER — Encounter: Payer: Self-pay | Admitting: Neurology

## 2024-03-06 NOTE — Procedures (Signed)
 ELECTROENCEPHALOGRAM REPORT  Dates of Recording: 12/23/2023 9:46AM to 12/26/2023 11:7AM  Patient's Name: Mark Mcgee MRN: [id number] Date of Birth: 12/05/1965  Referring Provider: Dr. 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 EMU in 2024 did not captured typical events, he reports 4 episodes a week. EEG for classification.  CNS Active Medications: Lamictal , Gabapentin , Xanax   Technical Summary: This is a 49-hour multichannel digital EEG recording measured by the international 10-20 system with electrodes applied with paste and impedances below 5000 ohms performed as portable with EKG monitoring.  The digital EEG was referentially recorded, reformatted, and digitally filtered in a variety of bipolar and referential montages for optimal display.    DESCRIPTION OF RECORDING: During maximal wakefulness, the background activity consisted of a symmetric 9 Hz posterior dominant rhythm which was reactive to eye opening.  There were no epileptiform discharges or focal slowing seen in wakefulness.  During the recording, the patient progresses through wakefulness, drowsiness, and Stage 2 sleep.  Again, there were no epileptiform discharges seen.  Events: On 3/28 at 1509 hours, there is 4-5 Hz muscle artifact seen lasting 37 seconds with no underlying epileptiform discharges during event, baseline EEG seen after episode. No video captured. He pushed the button after event. No symptoms reported on diary.  On 3/28 at 1928 hours, there is movement and muscle artifact lasting 45 seconds followed by push button. No epileptiform activity seen. No video captured. No symptoms reported on diary.  On 3/28 at 1933 hours, there is 4Hz  muscle artifact seen lasting 30 seconds with no underlying epileptiform activity seen. No video captured. No symptoms reported on diary.  On 3/29 at 1527 hours, there is 2 Hz  muscle artifact seen lasting 27 seconds with no underlying epileptiform activity seen. No video captured. No symptoms reported on diary.  On 3/30 at 2100 hours, there is 2Hz  muscle artifact seen that increases to 4-5 Hz lasting 75 seconds followed by push button. No underlying epileptiform activity, return to baseline EEG after event. No video captured. No symptoms reported on diary.  There were 10 other push button events with no EEG or EKG changes seen. No video captured, patient did not complete diary.  There were no electrographic seizures seen.  EKG lead was unremarkable.  IMPRESSION: This 49-hour ambulatory EEG study is normal.    CLINICAL CORRELATION: There were 5 push button events for rhythmic muscle artifact lasting 27 to 75 seconds with no epileptiform activity seen. Since video was not captured, semiology of these episodes cannot be described. Inpatient video EEG is strongly recommended.    Rayfield Cairo, M.D.

## 2024-03-06 NOTE — Addendum Note (Signed)
 Addended by: Jhonny Moss on: 03/06/2024 12:30 AM   Modules accepted: Orders

## 2024-03-13 ENCOUNTER — Encounter: Payer: Self-pay | Admitting: Psychology

## 2024-03-16 ENCOUNTER — Other Ambulatory Visit (HOSPITAL_COMMUNITY): Payer: Self-pay

## 2024-03-16 ENCOUNTER — Other Ambulatory Visit: Payer: Self-pay | Admitting: Internal Medicine

## 2024-03-18 ENCOUNTER — Ambulatory Visit
Admission: RE | Admit: 2024-03-18 | Discharge: 2024-03-18 | Discharge status: Home / Self Care | Source: Ambulatory Visit | Attending: Neurology

## 2024-03-18 DIAGNOSIS — R569 Unspecified convulsions: Secondary | ICD-10-CM

## 2024-03-18 DIAGNOSIS — G40009 Localization-related (focal) (partial) idiopathic epilepsy and epileptic syndromes with seizures of localized onset, not intractable, without status epilepticus: Secondary | ICD-10-CM

## 2024-03-18 MED ORDER — GADOPICLENOL 0.5 MMOL/ML IV SOLN
10.0000 mL | Freq: Once | INTRAVENOUS | Status: AC | PRN
Start: 2024-03-18 — End: 2024-03-18
  Administered 2024-03-18: 10 mL via INTRAVENOUS

## 2024-03-18 MED ORDER — AMPHETAMINE-DEXTROAMPHETAMINE 10 MG PO TABS
10.0000 mg | ORAL_TABLET | Freq: Two times a day (BID) | ORAL | 0 refills | Status: DC
  Filled 2024-03-18: qty 60, 30d supply, fill #0

## 2024-03-19 ENCOUNTER — Other Ambulatory Visit: Payer: Self-pay | Admitting: Internal Medicine

## 2024-03-19 ENCOUNTER — Other Ambulatory Visit: Payer: Self-pay | Admitting: Neurology

## 2024-03-19 ENCOUNTER — Other Ambulatory Visit: Payer: Self-pay

## 2024-03-20 ENCOUNTER — Other Ambulatory Visit (HOSPITAL_COMMUNITY): Payer: Self-pay

## 2024-03-20 ENCOUNTER — Other Ambulatory Visit: Payer: Self-pay

## 2024-03-20 MED ORDER — MIRTAZAPINE 7.5 MG PO TABS
7.5000 mg | ORAL_TABLET | Freq: Every day | ORAL | 1 refills | Status: DC
  Filled 2024-03-20: qty 30, 30d supply, fill #0
  Filled 2024-04-20: qty 30, 30d supply, fill #1

## 2024-03-20 MED ORDER — HYDROCODONE-ACETAMINOPHEN 10-325 MG PO TABS
1.0000 | ORAL_TABLET | Freq: Four times a day (QID) | ORAL | 0 refills | Status: DC | PRN
  Filled 2024-03-20: qty 120, 30d supply, fill #0

## 2024-03-29 ENCOUNTER — Ambulatory Visit: Payer: Self-pay | Admitting: Neurology

## 2024-04-02 ENCOUNTER — Ambulatory Visit: Payer: Self-pay | Admitting: Psychology

## 2024-04-02 ENCOUNTER — Ambulatory Visit: Admitting: Psychology

## 2024-04-02 DIAGNOSIS — F419 Anxiety disorder, unspecified: Secondary | ICD-10-CM

## 2024-04-02 DIAGNOSIS — R4189 Other symptoms and signs involving cognitive functions and awareness: Secondary | ICD-10-CM

## 2024-04-02 DIAGNOSIS — R419 Unspecified symptoms and signs involving cognitive functions and awareness: Secondary | ICD-10-CM

## 2024-04-02 NOTE — Progress Notes (Signed)
   Psychometrician Note   Cognitive testing was administered to Mark Mcgee by Lonell Jude, B.S. (psychometrist) under the supervision of Dr. Renda Beckwith, Psy.D., licensed psychologist on 04/02/2024. [name] did not appear overtly distressed by the testing session per behavioral observation or responses across self-report questionnaires. Rest breaks were offered.    The battery of tests administered was selected by Dr. Renda Beckwith, Psy.D. with consideration to [name] current level of functioning, the nature of his symptoms, emotional and behavioral responses during interview, level of literacy, observed level of motivation/effort, and the nature of the referral question. This battery was communicated to the psychometrist. Communication between Dr. Renda Beckwith, Psy.D. and the psychometrist was ongoing throughout the evaluation and Dr. Renda Beckwith, Psy.D. was immediately accessible at all times. Dr. Renda Beckwith, Psy.D. provided supervision to the psychometrist on the date of this service to the extent necessary to assure the quality of all services provided.    Mark Mcgee will return within approximately 1-2 weeks for an interactive feedback session with Dr. Beckwith at which time his test performances, clinical impressions, and treatment recommendations will be reviewed in detail. [name] understands he can contact our office should he require our assistance before this time.  A total of 145 minutes of billable time were spent face-to-face with [name] by the psychometrist. This includes both test administration and scoring time. Billing for these services is reflected in the clinical report generated by Dr. Renda Beckwith, Psy.D.  This note reflects time spent with the psychometrician and does not include test scores or any clinical interpretations made by Dr. Beckwith. The full report will follow in a separate note.

## 2024-04-02 NOTE — Telephone Encounter (Signed)
 Called and informed pt of results per Dr. Georjean. He understood and he was in the lobby wait for neurocog test.

## 2024-04-03 NOTE — Progress Notes (Signed)
 NEUROPSYCHOLOGICAL EVALUATION [place]. [facility]  [place] Department of Neurology  Date of Evaluation: 04/02/2024  REASON FOR REFERRAL   Mark Mcgee is a 58 year old, right-handed, White male with 16 years of formal education. He was referred for neuropsychological evaluation by his neurologist, Darice Shivers, M.D., to assess current neurocognitive functioning, document potential cognitive deficits, and assist with treatment planning. This is his first neuropsychological evaluation.  SUMMARY OF RESULTS   Premorbid cognitive abilities are estimated to be in the high average range based on word reading and sociodemographic factors. Consistent with this baseline estimate, the patient demonstrated intact attention/working memory, processing speed, executive functioning, language, visuospatial abilities, learning/memory, and fine motor dexterity. Scores in most domains were not only within the expected range compared to a normative sample of his peers, but often surpassed expectations. Learning/memory was particularly remarkable, as scores were within the high average to exceptionally high range across all three measures. On self-report questionnaires, he endorsed mild anxiety and minimal depression. He did not report elevated levels of excessive daytime sleepiness.   DIAGNOSTIC IMPRESSION   Results of the current evaluation indicated normal, if not superior, cognitive functioning, with no evidence of a neurocognitive disorder at this time. His overall profile reflects healthy cognitive aging and well-preserved cognitive and functional abilities. Subjective cognitive complaints are likely related to normal aging and personal factors such as mood, poor sleep, and attentional challenges (especially considering the recent reduction in his Adderall dosage). While attention deficits were not prominent on testing, he did make some errors on tasks requiring complex problem-solving and  multitasking. Given that these errors occurred in a highly structured testing environment, it is plausible to hypothesize that greater difficulties could arise in his actual work environment, where demands, deadlines, and expectations are higher. Furthermore, as someone accustomed to high performance, perceived shortcomings in meeting his usual standards may increase pressure, potentially exacerbating memory challenges. Encouragingly, memory concerns appear primarily limited to work-related situations at this time.   While today's evaluation did not reveal deficits indicative of seizure-related cognitive impairment, the possibility of seizures contributing to symptoms cannot be entirely ruled out. Patient should be commended for his diligence in following all recommended treatments to date. Results further serve as a baseline for future comparison, should reevaluation ever become necessary.   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  Patient has already been referred to speech therapy for cognitive rehabilitation; it is recommended that he follow through with scheduled sessions to support cognitive functioning.  Consider implementing compensatory strategies to support daily functioning and promote independence. Some of these techniques may be addressed and practiced in cognitive therapy, but the following are also practical steps you can begin applying independently:  -Adhere to routine. Compensatory strategies work best when they are used consistently. Use a planner, calendar, or white board that has the schedule and important events for the day clearly listed to reference and cross off when tasks are complete.   -Ask for written information, especially if it is  new or unfamiliar (e.g., information provided at a doctor's appointment).   -Create an organized environment. Keep items that can be easily misplaced in a sensible location and get into the habit of always returning the items to those places.   -Pay attention and reduce distractions. Make a point of focusing attention on information you  want to remember. One-on-one interaction is more likely to facilitate attention and minimize distraction. Make eye contact and repeat the information out loud after you hear it. Reduce interruptions or distractions especially when attempting to learn new information.   -Create associations. When learning something new, think about and understand the information. Explain it in your own words or try to associate it with something you already know. Take notes to help remember important details.  -Evaluate goals and plan accordingly. When confronted by many different tasks, begin by making a list that prioritizes each task and estimates the time it will take to complete. Break down complicated tasks into smaller, more manageable steps.   -Focus on one task at a time and complete each task before starting another. Avoid multitasking.  Given ongoing sleep difficulties, he may benefit from the implementation of sleep hygiene techniques, including:  -Go to bed and get up at the same time each day to help your body establish a regular rhythm.  -Establish and maintain a bedtime routine. Certain activities such as stretching, meditating, listening to soft music, or reading ~15 minutes before bedtime can be a great way to regularly get your brain and body ready for sleep.  -Avoid taking naps during the day.  -Avoid alcohol and caffeine for 5 or 6 hours before going to bed.  -Get regular exercise, but not in the hours before bedtime.  -Use comfortable bedding and maintain a cool temperature in your bedroom  -Block out light and distracting noise.  -Avoid watching  television or using your phone/computer in bed.  -Avoid staying in bed if you have difficulty falling asleep. If you have not been able to get to sleep after about 20 minutes or more, get up and do something calming or boring until you feel sleepy, then return to bed and try again.  Support long-term brain and physical health by maintaining a heart-healthy lifestyle, including a balanced diet (e.g., MIND or Mediterranean), regular physician-approved physical activity, and consistent adherence to prescribed medications.  Aim to regularly participate in activities that you find enjoyable and fulfilling, whether that be hobbies, socializing with loved ones, or being outdoors. This can improve mood, increase motivation, and offer cognitive stimulation.  DISPOSITION   Patient will follow up with the referring provider, Dr. Georjean. No follow-up neuropsychological testing was scheduled at this time. Please feel free to refer the patient for repeated evaluation if he shows a significant change in neurocognitive status. He will be provided verbal feedback in approximately one week regarding the findings and impression during this visit.  The remainder of the report includes the details of the patient's background and a table of results from the current evaluation, which support the summary and recommendations described above.  BACKGROUND   History of Presenting Illness: The following information was obtained from a review of medical records and an interview with the patient. Patient established care with Dr. Georjean at [facility] in 2023 for evaluation of new-onset seizures. According to records, he had no prior history of seizures until November 2023, when he experienced three seizures in a single day. Please refer to the medical record for a detailed description of these events. Since that time, he has undergone extensive neurological workup, including a routine EEG in 08/2022 (normal), an ambulatory  EEG in 12/2022 (normal, several seizure-like events without EEG correlate), and an EMU admission in 04/2023 (normal, one seizure-like event without EEG correlate). As convulsive activity and loss of consciousness have not yet been captured, and  a repeat EMU admission with video monitoring has been recommended in order to document these more significant events. During his most recent visit with Dr. Georjean on 01/25/2024, the primary concern was noted to be worsening cognition, which has progressed to the point of interfering with his work. As a result, neuropsychological evaluation was recommended.  Cognitive Functioning: During today's appointment, the patient reports experiencing cognitive changes since January 2024, which tend to worsen following seizure episodes and improve somewhat during seizure-free periods. His primary concern is short-term memory difficulties that appear specific to his work setting and are not necessarily evident in his daily personal life. 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 He notes that it now takes him longer to work through problems and that he frequently relies on notes and manuals to complete tasks that were previously routine. He also reports difficulty recalling passwords, as well as terms and acronyms that were once familiar to him, although he denies broader language difficulties. His supervisor has noticed these changes but has been supportive and understanding, being aware of his seizure history. He denies concerns about long-term memory. He was diagnosed with ADHD in adulthood and was prescribed Adderall, which was recently reduced due to concerns about seizures. He reports that he does not feel unfocused at work. He remains capable of performing executive functions such as planning, organizing, and multitasking, though he finds these activities require significantly more effort than in  the past. He has also noticed a mild decline in processing speed. He denies any difficulties with navigation.  Physical Functioning: Patient reports ongoing sleep difficulties, specifically a lack of deep, restorative sleep. He is typically able to fall asleep without issue but wakes easily to minimal noise and does not feel well-rested upon waking. He notes that his smartwatch data corroborates this, showing limited deep sleep phases on most nights. Appetite is stable. No changes to sense of taste or smell were reported. Patient reports a recent change in vision, which is now stable with the use of new contact lenses. Hearing remains stable. He has a bilateral hand tremor, which he notes is also present in his sister. He denies any balance issues or history of falls.  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 He also reports situational anxiety, particularly related to being in crowds and concerns about having a seizure in public. He denies any suicidal ideation. Although he generally enjoys being active, he has recently limited his activities--for example, only taking walks when accompanied by someone.  Imaging: MRI of the brain (03/18/2024) documented cerebral atrophy including prominent white matter volume loss in the posterior cerebral hemispheres right greater than left and few tiny nonspecific chronic insults within the cerebral white matter.  Other Relevant Medical History: Remarkable for hypertension, Behcet's syndrome, coronary atherosclerosis, and benign prostatic hyperplasia. Please refer to the medical record for a more comprehensive problem list. Patient reports a history of multiple sports-related concussions but denies any major head injuries or ongoing cognitive symptoms as a result. No history of stroke or CNS infection was reported.  Current Medications: Per record, acetaminophen , albuterol , alprazolam ,  amlodipine -olmesartan , amphetamine -dextroamphetamine , diazepam , diphenoxylate -atropine , flaxseed, fluticasone -umeclidin-vilant, gabapentin , glycopyrrolate , hydrocodone -acetaminophen , lamotrigine , loperamide , mirtazapine , oyster shell, tadalafil , testosterone  cypionate, zinc  gluconate, and vitamins (B complex, B12, D3, multi).  Functional Status: Patient independently performs all basic and instrumental activities of daily living, including managing his finances and medications. He  is not driving at this time due to seizures.  Family Neurological History: Remarkable for post-stroke dementia in his mother and unspecified dementia in his paternal grandmother and maternal great-grandmother.   Psychiatric History: Remarkable for anxiety and panic attacks, currently managed with medication and weekly cognitive behavioral therapy. History of depression, suicidal ideation, hallucinations, and psychiatric hospitalizations was not reported.  Substance Use History: Patient denies current use of alcohol, nicotine, marijuana, and illicit substances. He notes that he stopped drinking alcohol following the onset of seizures.  Social and Developmental History: Patient was born and raised in [place] . 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 History developmental delays was not reported. He currently resides with his partner. He does not have any children.   Educational and Occupational History: No history of childhood learning disability, special education services, or grade retention was reported. Patient denies having experienced or been aware of ADHD symptoms during childhood. Regarding his educational background, he first obtained a LPN qualification, then completed an ADN and a bachelor's degree in computer science concurrently, followed by a BSN. 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 He ambulated independently and without gait disturbance. He was alert and fully oriented. He was appropriately groomed and dressed for the setting. No significant sensory or motor abnormalities were observed. Vision (with contacts and readers) and hearing were adequate for testing purposes. Speech was of normal rate, prosody, and volume. No conversational word-finding difficulties, paraphasic errors, or dysarthria were observed. Comprehension was conversationally intact. Thought processes were linear, logical, and coherent. Thought content was organized and devoid of delusions. Insight appeared appropriate. Affect was even and congruent with euthymic mood. He was cooperative and gave adequate effort during testing, including on standalone and embedded measures of performance validity. Results are thought to accurately reflect his cognitive functioning at this time.  NEUROPSYCHOLOGICAL TESTING RESULTS   Tests Administered: Animal Naming Test; Beck Anxiety Inventory (BAI); Beck Depression Inventory II (BDI-II); Brief Visuospatial Memory Test-Revised (BVMT-R) - Form 1; [place]  Verbal Learning Test Third Edition (CVLT3) - Standard Form; Controlled Oral Word Association Test (COWAT): FAS; Epworth Sleepiness Scale (ESS); Grooved Pegboard Test; Judgment of Line Orientation (JLO) - Form H; Neuropsychological Assessment Battery (NAB) - Subtest(s): Naming Form 1; Standalone performance validity tests (PVTs); Test of Premorbid Functioning (TOPF); Trail Making Test (TMT); Wechsler Adult Intelligence Scale Fifth Edition (WAIS-5) - Subtest(s): Similarities, Clinical cytogeneticist, Matrix Reasoning, Digit Sequencing, Coding, Running Digits, Symbol Search, Symbol Span; Wechsler Memory Scale Fourth Edition (WMS-IV) - Subtest(s): Logical Memory (LM); and [place]  Card Sorting Test 64 Card Version (WCST-64).  Test results are  provided in the table below. Whenever possible, the patient's scores were compared against age-, sex-, and education-corrected normative samples. Interpretive descriptions are based on the AACN consensus conference statement on uniform labeling (Guilmette et al., 2020).  PREMORBID FUNCTIONING RAW  RANGE  TOPF 56 StdS=113 High Average  ATTENTION & WORKING MEMORY RAW  RANGE  WAIS-5 Digit Sequencing -- ss=7 Low Average  WAIS-5 Running Digits -- ss=11 Average  WAIS-5 Symbol Span -- ss=10 Average  PROCESSING SPEED RAW  RANGE  Trails A 20''0e T=59 High Average  WAIS-5 Coding  -- ss=11 Average  WAIS-5 Symbol Search -- ss=12 High Average  EXECUTIVE FUNCTION RAW  RANGE  Trails B 69''3e T=47 Average  WAIS-5 Matrix  Reasoning -- ss=12 High Average  WAIS-5 Similarities -- ss=11 Average  COWAT Letter Fluency 13+10+13 T=43 Average  WCST-64 Total Errors 20 T=42 Low Average  WCST-64 Perseverative Errors 12 T=37 Low Average  WCST-64 Nonperseverative Errors 8 T=43 Average  WCST-64 Categories Completed 1 6-10%ile Below Average to Low Average  WCSR-64 FMS 4 -- --  LANGUAGE RAW  RANGE  COWAT Letter Fluency 13+10+13 T=43 Average  Animal Naming Test 15 T=38 Low Average  NAB Naming Test 31/31 T=52 WNL  VISUOSPATIAL RAW  RANGE  WAIS-5 Block Design -- ss=13 High Average  JLO C/S=30/30 86+%ile WNL  BVMT-R Copy Trial 12/12 -- WNL  VERBAL LEARNING & MEMORY RAW  RANGE  CVLT3 Total 1-5 8,11,14,11,12 StdS=110 High Average  CVLT3 Trial B 6/16 ss=11 Average  CVLT3 SDFR  12/16 ss=12 High Average  CVLT3 SDCR 14/16 ss=13 High Average  CVLT3 LDFR  14/16 ss=13 High Average  CVLT3 LDCR  15/16 ss=13 High Average  CVLT3 Recognition Hits 16 ss=13 High Average  CVLT3 Recognition False+ 0 ss=13 High Average  CVLT3 Discriminability -- ss=15 Above Average  CVLT3 Intrusions 1 ss=12 High Average  CVLT3 Repetitions 8 ss=9 Average  CVLT3 Forced Choice 16/16 -- WNL  WMS-IV LM-I  (11+10)/50 ss=8 Average  WMS-IV LM-II   (8+10)/50 ss=9 Average  WMS-IV LM Recognition  (12+14)/30 >75%ile High Average to Exceptionally High  VISUAL LEARNING & MEMORY RAW  RANGE  BVMT-R Total Recall (11+12+12)/36 T=72 Exceptionally High  BVMT-R Delayed Recall 12/12 T=66 Above Average  BVMT-R Percent Retained 100 >16%ile WNL  BVMT-R Recognition Hits 6 >16%ile WNL  BVMT-R Recognition False Alarms 0 >16%ile WNL  BVMT-R Recognition Discrimination Index 6 >16%ile WNL  FINE MOTOR DEXTERITY RAW  RANGE  Grooved Pegboard (Dominant Hand) 75''0d T=42 Low Average  Grooved Pegboard (Non-Dominant Hand) 66''0d T=58 High Average  QUESTIONNAIRES RAW  RANGE  BDI 10 -- Minimal  BAI 15 -- Mild  ESS 9 -- WNL  *Note: ss = scaled score; StdS = standard score; T = t-score; C/S = corrected raw score; WNL = within normal limits; BNL= below normal limits; D/C = discontinued. Scores from skewed distributions are typically interpreted as WNL (>=16th %ile) or BNL (<16th %ile).   INFORMED CONSENT   Patient was provided with a verbal description of the nature and purpose of the neuropsychological evaluation. Also reviewed were the foreseeable risks and/or discomforts and benefits of the procedure, limits of confidentiality, and mandatory reporting requirements of this provider. Patient was given the opportunity to have their questions answered. Oral consent to participate was provided by the patient.   This report was prepared as part of a clinical evaluation and is not intended for forensic use.  SERVICE   This evaluation was conducted by Renda Beckwith, Psy.D. In addition to time spent directly with the patient, total professional time (120 minutes) includes record review, integration of relevant medical history, test selection, interpretation of findings, and report preparation. A technician, Lonell Jude, B.S., provided testing and scoring assistance for 145 minutes.  Psychiatric Diagnostic Evaluation Services (Professional): 09208 x  1 Neuropsychological Testing Evaluation Services (Professional): 03867 x 1 Neuropsychological Testing Evaluation Services (Professional): 03866 x 1 Neuropsychological Test Administration and Scoring Radiographer, therapeutic): [phone] x 1 Neuropsychological Test Administration and Scoring (Technician): [phone] x 4  This report was generated using voice recognition software. While this document has been carefully reviewed, transcription errors may be present. I apologize in advance for any inconvenience. Please contact me if further clarification is needed.  Renda Beckwith, Psy.D.             Neuropsychologist

## 2024-04-10 ENCOUNTER — Ambulatory Visit: Admitting: Psychology

## 2024-04-10 DIAGNOSIS — F419 Anxiety disorder, unspecified: Secondary | ICD-10-CM

## 2024-04-10 DIAGNOSIS — R419 Unspecified symptoms and signs involving cognitive functions and awareness: Secondary | ICD-10-CM

## 2024-04-10 NOTE — Progress Notes (Signed)
   NEUROPSYCHOLOGY FEEDBACK SESSION [place]. [facility]  [place] Department of Neurology  Date of Feedback Session: 04/10/2024  REASON FOR REFERRAL   Mark Mcgee is a 58 year old, right-handed, White male with 16 years of formal education. He was referred for neuropsychological evaluation by his neurologist, Darice Shivers, M.D., to assess current neurocognitive functioning, document potential cognitive deficits, and assist with treatment planning. This is his first neuropsychological evaluation.   FEEDBACK   Patient completed a comprehensive neuropsychological evaluation on 04/02/2024. Please refer to that encounter for the full report and recommendations. Briefly, results indicated normal, if not superior, cognitive functioning, with no evidence of a neurocognitive disorder at this time. His overall profile reflects healthy cognitive aging and well-preserved cognitive and functional abilities. Subjective cognitive complaints are likely related to normal aging and personal factors such as mood, poor sleep, and attentional challenges (especially considering the recent reduction in his Adderall dosage). While attention deficits were not prominent on testing, he did make some errors on tasks requiring complex problem-solving and multitasking. Given that these errors occurred in a highly structured testing environment, it is plausible to hypothesize that greater difficulties could arise in his actual work environment, where demands, deadlines, and expectations are higher. Furthermore, as someone accustomed to high performance, perceived shortcomings in meeting his usual standards may increase pressure, potentially exacerbating memory challenges. Encouragingly, memory concerns appear primarily limited to work-related situations at this time. While today's evaluation did not reveal deficits indicative of seizure-related cognitive impairment, the possibility of seizures contributing to symptoms  cannot be entirely ruled out.   Today, the patient was unaccompanied. He was provided verbal feedback regarding the findings and impression during this visit, and his questions were answered. A copy of the report was provided at the conclusion of the visit.  DISPOSITION   Patient will follow up with the referring provider, Dr. Shivers. No follow-up neuropsychological testing was scheduled at this time. Please feel free to refer the patient for repeated evaluation if he shows a significant change in neurocognitive status.  SERVICE   This feedback session was conducted by Renda Beckwith, Psy.D. One unit of [phone] (40 minutes) was billed for Dr. Beckwith' time spent in preparing, conducting, and documenting the current feedback session.  This report was generated using voice recognition software. While this document has been carefully reviewed, transcription errors may be present. I apologize in advance for any inconvenience. Please contact me if further clarification is needed.

## 2024-04-20 ENCOUNTER — Other Ambulatory Visit: Payer: Self-pay

## 2024-04-20 ENCOUNTER — Other Ambulatory Visit (HOSPITAL_COMMUNITY): Payer: Self-pay

## 2024-04-20 ENCOUNTER — Other Ambulatory Visit: Payer: Self-pay | Admitting: Internal Medicine

## 2024-04-21 ENCOUNTER — Other Ambulatory Visit (HOSPITAL_COMMUNITY): Payer: Self-pay

## 2024-04-21 MED ORDER — CYANOCOBALAMIN 1000 MCG/ML IJ SOLN
1000.0000 ug | INTRAMUSCULAR | 6 refills | Status: DC
  Filled 2024-04-21: qty 10, 30d supply, fill #0

## 2024-04-21 MED ORDER — AMPHETAMINE-DEXTROAMPHETAMINE 10 MG PO TABS
10.0000 mg | ORAL_TABLET | Freq: Two times a day (BID) | ORAL | 0 refills | Status: DC
  Filled 2024-04-21: qty 60, 30d supply, fill #0

## 2024-04-21 MED ORDER — HYDROCODONE-ACETAMINOPHEN 10-325 MG PO TABS
1.0000 | ORAL_TABLET | Freq: Four times a day (QID) | ORAL | 0 refills | Status: DC | PRN
  Filled 2024-04-21: qty 120, 30d supply, fill #0

## 2024-04-21 MED ORDER — ALPRAZOLAM 2 MG PO TABS
1.0000 mg | ORAL_TABLET | Freq: Four times a day (QID) | ORAL | 1 refills | Status: DC | PRN
  Filled 2024-04-21: qty 120, 60d supply, fill #0
  Filled 2024-07-19: qty 120, 60d supply, fill #1

## 2024-04-23 ENCOUNTER — Other Ambulatory Visit: Payer: Self-pay

## 2024-05-03 ENCOUNTER — Ambulatory Visit: Admitting: Internal Medicine

## 2024-05-03 ENCOUNTER — Encounter: Payer: Self-pay | Admitting: Internal Medicine

## 2024-05-03 VITALS — BP 107/73 | HR 76 | Temp 98.1°F | Ht 67.0 in | Wt 211.0 lb

## 2024-05-03 DIAGNOSIS — I10 Essential (primary) hypertension: Secondary | ICD-10-CM | POA: Diagnosis not present

## 2024-05-03 DIAGNOSIS — G8929 Other chronic pain: Secondary | ICD-10-CM

## 2024-05-03 DIAGNOSIS — E291 Testicular hypofunction: Secondary | ICD-10-CM

## 2024-05-03 DIAGNOSIS — F411 Generalized anxiety disorder: Secondary | ICD-10-CM

## 2024-05-03 DIAGNOSIS — R634 Abnormal weight loss: Secondary | ICD-10-CM

## 2024-05-03 DIAGNOSIS — F410 Panic disorder [episodic paroxysmal anxiety] without agoraphobia: Secondary | ICD-10-CM

## 2024-05-03 DIAGNOSIS — Z7721 Contact with and (suspected) exposure to potentially hazardous body fluids: Secondary | ICD-10-CM

## 2024-05-03 DIAGNOSIS — R197 Diarrhea, unspecified: Secondary | ICD-10-CM

## 2024-05-03 DIAGNOSIS — K580 Irritable bowel syndrome with diarrhea: Secondary | ICD-10-CM

## 2024-05-03 DIAGNOSIS — M5440 Lumbago with sciatica, unspecified side: Secondary | ICD-10-CM

## 2024-05-03 DIAGNOSIS — E538 Deficiency of other specified B group vitamins: Secondary | ICD-10-CM

## 2024-05-03 DIAGNOSIS — G894 Chronic pain syndrome: Secondary | ICD-10-CM

## 2024-05-03 LAB — CBC WITH DIFFERENTIAL/PLATELET
Basophils Absolute: 0.1 K/uL (ref 0.0–0.1)
Basophils Relative: 0.7 % (ref 0.0–3.0)
Eosinophils Absolute: 0.2 K/uL (ref 0.0–0.7)
Eosinophils Relative: 2.3 % (ref 0.0–5.0)
HCT: 43.2 % (ref 39.0–52.0)
Hemoglobin: 13.6 g/dL (ref 13.0–17.0)
Lymphocytes Relative: 26.2 % (ref 12.0–46.0)
Lymphs Abs: 2 K/uL (ref 0.7–4.0)
MCHC: 31.5 g/dL (ref 30.0–36.0)
MCV: 76.8 fl — ABNORMAL LOW (ref 78.0–100.0)
Monocytes Absolute: 0.6 K/uL (ref 0.1–1.0)
Monocytes Relative: 7.6 % (ref 3.0–12.0)
Neutro Abs: 4.7 K/uL (ref 1.4–7.7)
Neutrophils Relative %: 63.2 % (ref 43.0–77.0)
Platelets: 255 K/uL (ref 150.0–400.0)
RBC: 5.63 Mil/uL (ref 4.22–5.81)
RDW: 14.7 % (ref 11.5–15.5)
WBC: 7.5 K/uL (ref 4.0–10.5)

## 2024-05-03 LAB — PSA: PSA: 0.63 ng/mL (ref 0.10–4.00)

## 2024-05-03 LAB — TESTOSTERONE: Testosterone: 264.59 ng/dL — ABNORMAL LOW (ref 300.00–890.00)

## 2024-05-03 LAB — TSH: TSH: 2.67 u[IU]/mL (ref 0.35–5.50)

## 2024-05-03 LAB — VITAMIN B12: Vitamin B-12: >1500 pg/mL — ABNORMAL HIGH (ref 211–911)

## 2024-05-03 LAB — VITAMIN D 25 HYDROXY (VIT D DEFICIENCY, FRACTURES): VITD: 37.43 ng/mL (ref 30.00–100.00)

## 2024-05-03 NOTE — Progress Notes (Signed)
 Subjective:  Patient ID: Mark Mcgee, male    DOB: 1966/05/25  Age: 58 y.o. MRN: [id number]  CC: No chief complaint on file.   HPI Mark Mcgee presents for LBP, IBS-D, seizures - better, ADD f/u  Outpatient Medications Prior to Visit  Medication Sig Dispense Refill   acetaminophen  (TYLENOL ) 325 MG tablet Take 325-650 mg by mouth every 6 (six) hours as needed for mild pain or headache.      acyclovir  (ZOVIRAX ) 400 MG tablet Take 1 tablet (400 mg total) by mouth 4 (four) times daily as needed (as directed for outbreaks). (Patient not taking: Reported on 01/25/2024) 60 tablet 3   albuterol  (VENTOLIN  HFA) 108 (90 Base) MCG/ACT inhaler Inhale 2 puffs into the lungs every 4 (four) hours as needed for wheezing or shortness of breath. 18 g 5   alprazolam  (XANAX ) 2 MG tablet Take 0.5 tablets (1 mg total) by mouth 4 (four) times daily as needed for sleep or anxiety. 120 tablet 1   amLODipine -olmesartan  (AZOR ) 10-40 MG tablet Take 1 tablet by mouth daily. 90 tablet 3   amphetamine -dextroamphetamine  (ADDERALL) 10 MG tablet Take 1 tablet (10 mg total) by mouth 2 (two) times daily with breakfast and lunch. 60 tablet 0   b complex vitamins tablet Take 1 tablet by mouth daily.     Cholecalciferol  (VITAMIN D3) 50 MCG (2000 UT) capsule Take 1 capsule (2,000 Units total) by mouth daily. 100 capsule 3   cyanocobalamin  (VITAMIN B12) 1000 MCG/ML injection Inject 1 mL (1,000 mcg total) into the skin daily x 1 week, then once weekly for 1 month, then once every 2 weeks 10 mL 6   diazePAM , 20 MG Dose, (VALTOCO  20 MG DOSE) 2 x 10 MG/0.1ML LQPK Spray in nose as instructed for seizure. May use second dose after 4 hours. 4 each 5   diphenoxylate -atropine  (LOMOTIL ) 2.5-0.025 MG tablet TAKE 1 OR 2 TABLETS BY MOUTH 4 TIMES DAILY AS NEEDED FOR DIARRHEA OR LOOSE STOOLS MAX OF 8 TABLETS PER DAY 60 tablet 3   Flaxseed, Linseed, (FLAX SEEDS) POWD 2 (two) times daily as needed (as directed when mixing into health shakes).      Fluticasone -Umeclidin-Vilant (TRELEGY ELLIPTA ) 100-62.5-25 MCG/ACT AEPB Inhale 1 puff into the lungs daily. 60 each 11   glycopyrrolate  (ROBINUL ) 2 MG tablet Take 1 tablet (2 mg total) by mouth 2 (two) times daily. 180 tablet 3   HYDROcodone -acetaminophen  (NORCO) 10-325 MG tablet Take 1 tablet by mouth every 6 (six) hours as needed. 120 tablet 0   lamoTRIgine  (LAMICTAL ) 200 MG tablet Take 1 tablet (200 mg total) by mouth in the morning AND 1.5 tablets (300 mg total) every evening. 225 tablet 3   loperamide  (IMODIUM ) 2 MG capsule Take 2 mg by mouth as needed for diarrhea or loose stools.     mirtazapine  (REMERON ) 7.5 MG tablet Take 1 tablet (7.5 mg total) by mouth at bedtime. 30 tablet 1   Multiple Vitamin (MULTIVITAMIN) capsule Take 1 capsule by mouth daily.      Oyster Shell (OYSTER CALCIUM) 500 MG TABS tablet Take 500 mg of elemental calcium by mouth daily.     SYRINGE-NEEDLE, DISP, 3 ML 22G X 1-1/2 3 ML MISC Use IM as directed 50 each 3   tadalafil  (CIALIS ) 5 MG tablet Take 1 tablet (5 mg total) by mouth daily. 90 tablet 3   testosterone  cypionate (DEPOTESTOSTERONE CYPIONATE) 200 MG/ML injection Inject 1 mL (200 mg total) into the muscle every 14 (fourteen) days.  10 mL 5   zinc  gluconate 50 MG tablet Take 100 mg by mouth daily.     gabapentin  (NEURONTIN ) 300 MG capsule Take 2 capsules (600 mg total) by mouth at bedtime. 60 capsule 6   No facility-administered medications prior to visit.    ROS: Review of Systems  Constitutional:  Positive for fatigue and unexpected weight change. Negative for appetite change.  HENT:  Negative for congestion, nosebleeds, sneezing, sore throat and trouble swallowing.   Eyes:  Negative for itching and visual disturbance.  Respiratory:  Negative for cough.   Cardiovascular:  Negative for chest pain, palpitations and leg swelling.  Gastrointestinal:  Positive for diarrhea. Negative for abdominal distention, blood in stool and nausea.  Genitourinary:   Negative for frequency and hematuria.  Musculoskeletal:  Positive for arthralgias and back pain. Negative for gait problem, joint swelling and neck pain.  Skin:  Negative for rash.  Neurological:  Positive for dizziness and seizures. Negative for tremors, speech difficulty and weakness.  Psychiatric/Behavioral:  Positive for decreased concentration and dysphoric mood. Negative for agitation, sleep disturbance and suicidal ideas. The patient is nervous/anxious.     Objective:  BP 107/73   Pulse 76   Temp 98.1 F (36.7 C) (Oral)   Ht 5' 7 (1.702 m)   Wt 211 lb (95.7 kg)   SpO2 99%   BMI 33.05 kg/m   BP Readings from Last 3 Encounters:  05/03/24 107/73  02/01/24 108/70  09/15/23 124/76    Wt Readings from Last 3 Encounters:  05/03/24 211 lb (95.7 kg)  02/01/24 231 lb 6.4 oz (105 kg)  01/25/24 220 lb (99.8 kg)    Physical Exam Constitutional:      General: He is not in acute distress.    Appearance: He is well-developed. He is not toxic-appearing.     Comments: NAD  Eyes:     Conjunctiva/sclera: Conjunctivae normal.     Pupils: Pupils are equal, round, and reactive to light.  Neck:     Thyroid : No thyromegaly.     Vascular: No JVD.  Cardiovascular:     Rate and Rhythm: Normal rate and regular rhythm.     Heart sounds: Normal heart sounds. No murmur heard.    No friction rub. No gallop.  Pulmonary:     Effort: Pulmonary effort is normal. No respiratory distress.     Breath sounds: Normal breath sounds. No wheezing or rales.  Chest:     Chest wall: No tenderness.  Abdominal:     General: Bowel sounds are normal. There is no distension.     Palpations: Abdomen is soft. There is no mass.     Tenderness: There is no abdominal tenderness. There is no guarding or rebound.  Musculoskeletal:        General: No tenderness. Normal range of motion.     Cervical back: Normal range of motion.  Lymphadenopathy:     Cervical: No cervical adenopathy.  Skin:    General: Skin is  warm and dry.     Findings: No rash.  Neurological:     Mental Status: He is alert and oriented to person, place, and time.     Cranial Nerves: No cranial nerve deficit.     Motor: No abnormal muscle tone.     Coordination: Coordination normal.     Gait: Gait normal.     Deep Tendon Reflexes: Reflexes are normal and symmetric.  Psychiatric:        Behavior: Behavior normal.  Thought Content: Thought content normal.        Judgment: Judgment normal.     Lab Results  Component Value Date   WBC 7.7 05/16/2023   HGB 11.8 (L) 05/16/2023   HCT 37.8 (L) 05/16/2023   PLT 249 05/16/2023   GLUCOSE 96 05/16/2023   CHOL 172 03/01/2023   TRIG 127.0 03/01/2023   HDL 46.80 03/01/2023   LDLDIRECT 91.0 12/04/2019   LDLCALC 99 03/01/2023   ALT 21 05/16/2023   AST 16 05/16/2023   NA 138 05/16/2023   K 4.2 05/16/2023   CL 106 05/16/2023   CREATININE 1.52 (H) 05/16/2023   BUN 21 (H) 05/16/2023   CO2 24 05/16/2023   TSH 1.78 03/01/2023   PSA 0.65 03/01/2023   INR 0.9 05/16/2023    MR BRAIN W WO CONTRAST Result Date: 03/29/2024 CLINICAL DATA:  Provided history: Localization-related idiopathic epilepsy and epileptic syndromes with seizures of localized onset, not intractable, without status epilepticus. Convulsions, unspecified convulsion type. Nonepileptic episode. EXAM: MRI HEAD WITHOUT AND WITH CONTRAST TECHNIQUE: Multiplanar, multiecho pulse sequences of the brain and surrounding structures were obtained without and with intravenous contrast. CONTRAST:  10 mL Vueway  intravenous contrast. COMPARISON:  Brain MRI 10/12/2022. FINDINGS: Brain: Cerebral atrophy is similar to the prior MRI of 10/12/2022. As before, there is moderately prominent enlargement of the atria and occipital horns of the lateral ventricles (right greater than left) with surrounding white matter volume loss. Few tiny nonspecific foci of T2 FLAIR hyperintense signal abnormality scattered elsewhere within the cerebral white  matter. No cortical encephalomalacia is identified. No appreciable hippocampal size or signal asymmetry. There is no acute infarct. No evidence of an intracranial mass. No chronic intracranial blood products. No extra-axial fluid collection. No midline shift. No pathologic intracranial enhancement identified. Vascular: Maintained flow voids within the proximal large arterial vessels. Skull and upper cervical spine: No focal worrisome marrow lesion. Incompletely assessed cervical spondylosis. Sinuses/Orbits: No mass or acute finding within the imaged orbits. Mild mucosal thickening scattered within the paranasal sinuses. IMPRESSION: 1.  No evidence of an acute intracranial abnormality. 2. Cerebral atrophy including prominent white matter volume loss in the posterior cerebral hemispheres (right greater than left), similar to the prior MRI of 10/12/2022. 3. Few tiny nonspecific chronic insults within the cerebral white matter, also similar to the prior exam. 4. Mild paranasal sinus mucosal thickening. Electronically Signed   By: Rockey Childs D.O.   On: 03/29/2024 09:45    Assessment & Plan:   Problem List Items Addressed This Visit     B12 deficiency - Primary   On SQ B12      Chronic pain disorder   Continue with Norco as needed.  Take as little as possible.  Potential benefits of a long term opioids use as well as potential risks (i.e. addiction risk, apnea etc) and complications (i.e. Somnolence, constipation and others) were explained to the patient and were aknowledged.      Diarrhea   Not better  Will try empiric Entocort 9 mg q am  Potential benefits of a long term steroid  use as well as potential risks  and complications were explained to the patient and were aknowledged.        Exposure to blood or body fluid   Relevant Orders   HIV 1 RNA quant-no reflex-bld   Generalized anxiety disorder with panic attacks (Chronic)   On Lamictal  F/u w/ Dr Georjean       IBS (irritable bowel  syndrome)  Relapsing explosive diarrhea. Mark Mcgee is unable to travel [employer], work accommodations needed On gluten and milk free diet  IBS-D      Weight loss   No red flags.Change in diet?       [facility] called the whole week if needed or call yeah  No orders of the defined types were placed in this encounter.     Follow-up: Return in about 3 months (around 08/03/2024) for a follow-up visit.  Marolyn Noel, MD

## 2024-05-03 NOTE — Assessment & Plan Note (Signed)
Not better  Will try empiric Entocort 9 mg q am  Potential benefits of a long term steroid  use as well as potential risks  and complications were explained to the patient and were aknowledged.

## 2024-05-03 NOTE — Assessment & Plan Note (Signed)
 On SQ B12

## 2024-05-03 NOTE — Assessment & Plan Note (Signed)
Relapsing explosive diarrhea. Mark Mcgee is unable to travel [employer], work accommodations needed On gluten and milk free diet  IBS-D

## 2024-05-03 NOTE — Assessment & Plan Note (Addendum)
 On Lamictal  F/u w/ Dr Georjean

## 2024-05-03 NOTE — Assessment & Plan Note (Signed)
 No red flags.Change in diet?

## 2024-05-03 NOTE — Assessment & Plan Note (Signed)
Continue with Norco as needed.  Take as little as possible.  Potential benefits of a long term opioids use as well as potential risks (i.e. addiction risk, apnea etc) and complications (i.e. Somnolence, constipation and others) were explained to the patient and were aknowledged. 

## 2024-05-04 ENCOUNTER — Ambulatory Visit: Payer: Self-pay | Admitting: Internal Medicine

## 2024-05-04 LAB — COMPREHENSIVE METABOLIC PANEL WITH GFR
ALT: 11 U/L (ref 0–53)
AST: 11 U/L (ref 0–37)
Albumin: 5.3 g/dL — ABNORMAL HIGH (ref 3.5–5.2)
Alkaline Phosphatase: 39 U/L (ref 39–117)
BUN: 14 mg/dL (ref 6–23)
CO2: 21 meq/L (ref 19–32)
Calcium: 10.2 mg/dL (ref 8.4–10.5)
Chloride: 101 meq/L (ref 96–112)
Creatinine, Ser: 1.37 mg/dL (ref 0.40–1.50)
GFR: 57.44 mL/min — ABNORMAL LOW (ref >60.00)
Glucose, Bld: 83 mg/dL (ref 70–99)
Potassium: 5 meq/L (ref 3.5–5.1)
Sodium: 140 meq/L (ref 135–145)
Total Bilirubin: 0.7 mg/dL (ref 0.2–1.2)
Total Protein: 7.3 g/dL (ref 6.0–8.3)

## 2024-05-04 LAB — LIPID PANEL
Cholesterol: 159 mg/dL (ref 0–200)
HDL: 48.6 mg/dL (ref >39.00)
LDL Cholesterol: 90 mg/dL (ref 0–99)
NonHDL: 110.56
Total CHOL/HDL Ratio: 3
Triglycerides: 101 mg/dL (ref 0.0–149.0)
VLDL: 20.2 mg/dL (ref 0.0–40.0)

## 2024-05-04 MED ORDER — CYANOCOBALAMIN 1000 MCG/ML IJ SOLN: 1000.0000 ug | INTRAMUSCULAR | Status: AC

## 2024-05-04 MED ORDER — TESTOSTERONE CYPIONATE 200 MG/ML IM SOLN: INTRAMUSCULAR | Status: AC

## 2024-05-06 LAB — HIV-1 RNA QUANT-NO REFLEX-BLD
HIV 1 RNA Quant: NOT DETECTED {copies}/mL
HIV-1 RNA Quant, Log: NOT DETECTED {Log_copies}/mL

## 2024-05-23 ENCOUNTER — Other Ambulatory Visit: Payer: Self-pay

## 2024-05-23 ENCOUNTER — Other Ambulatory Visit (HOSPITAL_COMMUNITY): Payer: Self-pay

## 2024-05-23 ENCOUNTER — Telehealth (INDEPENDENT_AMBULATORY_CARE_PROVIDER_SITE_OTHER): Admitting: Neurology

## 2024-05-23 ENCOUNTER — Encounter: Payer: Self-pay | Admitting: Neurology

## 2024-05-23 VITALS — Ht 67.0 in | Wt 199.0 lb

## 2024-05-23 DIAGNOSIS — R419 Unspecified symptoms and signs involving cognitive functions and awareness: Secondary | ICD-10-CM

## 2024-05-23 DIAGNOSIS — R569 Unspecified convulsions: Secondary | ICD-10-CM | POA: Diagnosis not present

## 2024-05-23 MED ORDER — LAMOTRIGINE 200 MG PO TABS
ORAL_TABLET | ORAL | 3 refills | Status: DC
  Filled 2024-05-23: qty 225, 90d supply, fill #0

## 2024-05-23 MED ORDER — PERAMPANEL 2 MG PO TABS
2.0000 mg | ORAL_TABLET | Freq: Every evening | ORAL | 5 refills | Status: DC
  Filled 2024-05-23: qty 30, 30d supply, fill #0

## 2024-05-23 NOTE — Patient Instructions (Signed)
 Good to see you.  Start Fycompa  (Perampanel ) 2mg : take 1 tablet every night. Please update me in 2 weeks, if no problems, I would like to increase to 4mg  every night  2. Continue Lamotrigine  200mg : Take 1 tablet in AM, 1.5 tablets in PM  3. Please start discussion with your employer about recommendation for inpatient video EEG monitoring  4. Reconnect with ENT for the tinnitus  5. Continue CBT with Glendia Maywood  6. Follow-up in 3 months, call for any changes.

## 2024-05-23 NOTE — Progress Notes (Signed)
 Virtual Visit via Video Note The purpose of this virtual visit is to provide medical care while limiting exposure to the novel coronavirus.    Consent was obtained for video visit:  Yes.   Answered questions that patient had about telehealth interaction:  Yes.    Pt location: Home Physician Location: office Name of referring provider:  Plotnikov, Mark GAILS, MD I connected with Mark Mcgee at patients initiation/request on 05/23/2024 at  1:30 PM EDT by video enabled telemedicine application and verified that I am speaking with the correct person using two identifiers. Pt MRN:  [id number] Pt DOB:  [birth year] Video Participants:  Mark Mcgee   History of Present Illness:  The patient had a virtual video visit on 05/23/2024. He was last evaluated in the neurology clinic 4 months ago for seizures that started in 07/2022. EMU monitoring in 04/2023 was normal, there was a mild episode with left arm twitching with no EEG correlate. He continued to report frequent seizures where he would fall and hurt himself, as well as more cognitive changes. He could not do another EMU admission due to work and had another 47-hour ambulatory EEG in 11/2023 however there was no video. Two push button events had muscle/movement artifact that appeared somewhat rhythmic, there were no epileptiform changes seen with them. On his last visit, aside from continued spells where he injures himself, he was also reporting worsening cognitive issues affecting work. He had a repeat brain MRI with and without contrast 03/2024 with no acute changes. There is again note of cerebral atrophy including prominent white matter volume loss in the posterior cerebral hemispheres (right greater than left), unchanged from 2024. He underwent Neurocognitive testing in 03/2024 with normal, if not superior, cognitive functioning, no evidence of a neurocognitive disorder seen. It was noted that subjective cognitive complaints are likely related to  normal aging and personal factors such as mood, poor sleep, attention challenges. He continues to do CBT but has not spoken to his therapist in 2 weeks.   He continues to report recurrent seizures 1-2 times a week, he had 2 last Monday, one while sitting at the desk, he woke up with his head on the desk with a nosebleed. He went down to clean up, then 2 hours later he got up then woke up in the kitchen. No prior warning. Prior to this, he had one Thursday last week in the garden, he woke up on the ground. He is on Lamotrigine  200mg  in AM, 300mg  in PM. He was started on Gabapentin  for tremors, anxiety, and possible seizures, but he self-weaned it off 1.5 months ago because he was feeling nauseated/sick and gained so much weight. His main worry is his work. 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 His mother's health is also declining. He does not sleep long, getting 6 hours of sleep. He rarely takes the mirtazapine . Mark Mcgee is home periodically but he is basically home alone. He continues to have significant tinnitus, he has tried masking with no improvement. He cannot focus because it is so distracting.    History on Initial Assessment 09/21/2022: This is a pleasant 58 year old right-handed man with a history of hypertension, hyperthyroidism, IBS, anxiety, tremor, presenting for evaluation of new onset seizures. He denies any prior history of seizures until 08/21/22 when he had 3 seizures in one  day. He was stressed out after putting his dog to sleep. He recalls driving home and pulling into the driveway but pulled in crooked. His right leg was slamming the break and he could not let go, he knew what was going on but could not do anything. He felt like he was stuck. His friend in the car with him was able to stop the car. He was able to get out of the car and then his friend witnessed  a generalized convulsion. He had shaking so bad it dented the side of his car with his arm. His friend lay him on his side, then he woke up and recalls getting up and walking around the car, then he felt like he was being choked then had another seizure. After this, he recalls getting up and going in the house then proceeded to have a third convulsion where he had urinary incontinence then vomited after. When EMS arrived, he was mildly confused with a hard time finding words. He recalls feeling very jittery, no focal weakness. He was brought to the ER where bloodwork showed a WBC of 15, creatinine 1.33. EKG NSR. Head CT no acute changes. UDS positive for opiates and THC. He is on hydrocodone  for chronic jaw pain and using CBD gummies to help with sleep/anxiety. Since then, he has had at least 5 more episodes of loss of consciousness where he wakes up on the floor. Last episode was last night, he went to the mantel to fix something then woke up on the floor. There was one witnessed incident 2 weeks ago where his friend told him he was staring and unresponsive for 5 minutes. He gets flashes in his eyes before the episodes, then when he wakes up it feels like he went through a workout at the gym. His chest muscles still hurt. Since then, he has also had spasms where what he is holding in his hand goes flying out. He loses his train of thought a lot. Since the initial seizures, he could not recall how to do things at work that he had been doing for over 6 years (works with [employer]). He feels like his brain is scrambled. He has not prior history of headaches but since the seizures has had constant headaches with pressure on both side, 2/10 in intensity today, no nausea/vomiting. He is sensitive to lights. He has had loss of appetite. He has bilateral tinnitus, worse at night. Masking has not helped. HE is not sleeping well, getting 4-6 hours of sleep. Prior to the seizures, he was taking alprazolam  around 2 times a week,  but since the seizures he has had a constant nervousness and worsening hand tremors and taking 1/4-1/2 tablet daily. His PCP started Levetiracetam  500mg  BID which is making him sleepy. He denies any dizziness, diplopia, neck/back pain, bowel/bladder dysfunction. He was born 3 months premature. 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 He had a normal early development.  There is no history of febrile convulsions, CNS infections such as meningitis/encephalitis, significant traumatic brain injury, neurosurgical procedures, or family history of seizures. His sister also has tremors.  Prior ASMs: Levetiracetam , oxcarbazepine , Zonisamide , Gabapentin    Diagnostic Data: Brain MRI with and without contrast done 09/2022 no acute changes. There is moderately prominent enlargement of the atria and occipital horns of the right greater than left lateral ventricles with associated surrounding white matter volume loss. One or two small foci of T2 FLAIR hyperintensity in the cerebral  white matter are within normal limits for age.   Routine EEG 08/2022 normal  69-hour Ambulatory EEG 12/2022 normal. Push button events showed movement artifact, no epileptiform discharges seen. No video recorded.   EMU monitoring 8/12-23/2024 showed normal baseline EEG. There was 1 push button event for left index finger twitching followed by left arm twitching with no EEG correlate.  Lumbar puncture 11/2022: mildly elevated CSF protein 56, normal cell count, glucose. Negative autoimmune panel.   Current Outpatient Medications on File Prior to Visit  Medication Sig Dispense Refill   acetaminophen  (TYLENOL ) 325 MG tablet Take 325-650 mg by mouth every 6 (six) hours as needed for mild pain or headache.      albuterol  (VENTOLIN  HFA) 108 (90 Base) MCG/ACT inhaler Inhale 2 puffs into the lungs every 4 (four) hours as needed for wheezing or shortness of breath. 18 g 5   alprazolam  (XANAX ) 2 MG tablet Take 0.5 tablets  (1 mg total) by mouth 4 (four) times daily as needed for sleep or anxiety. 120 tablet 1   amLODipine -olmesartan  (AZOR ) 10-40 MG tablet Take 1 tablet by mouth daily. 90 tablet 3   amphetamine -dextroamphetamine  (ADDERALL) 10 MG tablet Take 1 tablet (10 mg total) by mouth 2 (two) times daily with breakfast and lunch. 60 tablet 0   b complex vitamins tablet Take 1 tablet by mouth daily.     diazePAM , 20 MG Dose, (VALTOCO  20 MG DOSE) 2 x 10 MG/0.1ML LQPK Spray in nose as instructed for seizure. May use second dose after 4 hours. 4 each 5   Flaxseed, Linseed, (FLAX SEEDS) POWD 2 (two) times daily as needed (as directed when mixing into health shakes).     Fluticasone -Umeclidin-Vilant (TRELEGY ELLIPTA ) 100-62.5-25 MCG/ACT AEPB Inhale 1 puff into the lungs daily. 60 each 11   HYDROcodone -acetaminophen  (NORCO) 10-325 MG tablet Take 1 tablet by mouth every 6 (six) hours as needed. 120 tablet 0   lamoTRIgine  (LAMICTAL ) 200 MG tablet Take 1 tablet (200 mg total) by mouth in the morning AND 1.5 tablets (300 mg total) every evening. 225 tablet 3   mirtazapine  (REMERON ) 7.5 MG tablet Take 1 tablet (7.5 mg total) by mouth at bedtime. 30 tablet 1   Multiple Vitamin (MULTIVITAMIN) capsule Take 1 capsule by mouth daily.      Oyster Shell (OYSTER CALCIUM) 500 MG TABS tablet Take 500 mg of elemental calcium by mouth daily.     SYRINGE-NEEDLE, DISP, 3 ML 22G X 1-1/2 3 ML MISC Use IM as directed 50 each 3   tadalafil  (CIALIS ) 5 MG tablet Take 1 tablet (5 mg total) by mouth daily. 90 tablet 3   testosterone  cypionate (DEPOTESTOSTERONE CYPIONATE) 200 MG/ML injection Inject 200 mg IM every 10 days     zinc  gluconate 50 MG tablet Take 100 mg by mouth daily.     acyclovir  (ZOVIRAX ) 400 MG tablet Take 1 tablet (400 mg total) by mouth 4 (four) times daily as needed (as directed for outbreaks). (Patient not taking: Reported on 05/23/2024) 60 tablet 3   Cholecalciferol  (VITAMIN D3) 50 MCG (2000 UT) capsule Take 1 capsule (2,000  Units total) by mouth daily. 100 capsule 3   cyanocobalamin  (VITAMIN B12) 1000 MCG/ML injection Inject 1 mL (1,000 mcg total) into the skin every 21 ( twenty-one) days.     diphenoxylate -atropine  (LOMOTIL ) 2.5-0.025 MG tablet TAKE 1 OR 2 TABLETS BY MOUTH 4 TIMES DAILY AS NEEDED FOR DIARRHEA OR LOOSE STOOLS MAX OF 8 TABLETS PER DAY (Patient not taking: Reported on  05/23/2024) 60 tablet 3   glycopyrrolate  (ROBINUL ) 2 MG tablet Take 1 tablet (2 mg total) by mouth 2 (two) times daily. 180 tablet 3   loperamide  (IMODIUM ) 2 MG capsule Take 2 mg by mouth as needed for diarrhea or loose stools.     No current facility-administered medications on file prior to visit.     Observations/Objective:   Vitals:   05/23/24 1318  Weight: 199 lb (90.3 kg)  Height: 5' 7 (1.702 m)   GEN:  He is awake, alert, tearful and appears depressed. No aphasia or dysarthria. Intact fluency and comprehension. Cranial nerves: Extraocular movements intact. No facial asymmetry. Motor: moves all extremities symmetrically, at least anti-gravity x 4.    Assessment and Plan:   This is a pleasant 58 yo RH man with a history of hypertension, hyperthyroidism, IBS, anxiety, tremor, with new onset seizures since 08/21/2022. He has been having convulsions and staring episodes. MRI brain 09/2022 and 03/2024 no acute changes however there is note of moderate prominent enlargement of the atria and occipital horns of the right greater than left lateral ventricles, hippocampi symmetric. His ambulatory EEG in 12/2022 and 03/2024 captured shaking episodes with no EEG correlate, normal baseline EEG. EMU admission in 04/2023 for 5 days did not capture typical events. He continues to report recurrent episodes 1-2 times a week, leading to injuries. At this point, I strongly recommend another inpatient EMU admission for characterization. Continue Lamotrigine  200mg  in AM, 300mg  in PM. We discussed adding Perampanel  2mg  at bedtime until then. Side effects  discussed, he will update me in 2 weeks, we may uptitrate as tolerated. We discussed Neurocognitive evaluation results, he continues to report cognitive concerns affecting work. Continue CBT with Glendia Maywood. He was advised to see ENT for the significant tinnitus. He does not drive. Follow-up in 3 months, call for any changes.    Follow Up Instructions:    -I discussed the assessment and treatment plan with the patient. The patient was provided an opportunity to ask questions and all were answered. The patient agreed with the plan and demonstrated an understanding of the instructions.   The patient was advised to call back or seek an in-person evaluation if the symptoms worsen or if the condition fails to improve as anticipated.    Darice CHRISTELLA Shivers, MD

## 2024-05-24 ENCOUNTER — Encounter: Payer: Self-pay | Admitting: Pharmacist

## 2024-05-24 ENCOUNTER — Other Ambulatory Visit: Payer: Self-pay

## 2024-06-11 ENCOUNTER — Other Ambulatory Visit: Payer: Self-pay

## 2024-06-11 ENCOUNTER — Other Ambulatory Visit: Payer: Self-pay | Admitting: Internal Medicine

## 2024-06-11 ENCOUNTER — Other Ambulatory Visit: Payer: Self-pay | Admitting: Neurology

## 2024-06-12 ENCOUNTER — Other Ambulatory Visit (HOSPITAL_COMMUNITY): Payer: Self-pay

## 2024-06-12 ENCOUNTER — Other Ambulatory Visit: Payer: Self-pay

## 2024-06-12 MED ORDER — GLYCOPYRROLATE 2 MG PO TABS
2.0000 mg | ORAL_TABLET | Freq: Two times a day (BID) | ORAL | 3 refills | Status: AC
  Filled 2024-06-12: qty 180, 90d supply, fill #0
  Filled 2024-07-19 – 2024-10-14 (×2): qty 180, 90d supply, fill #1

## 2024-06-12 MED ORDER — MIRTAZAPINE 7.5 MG PO TABS
7.5000 mg | ORAL_TABLET | Freq: Every day | ORAL | 1 refills | Status: DC
  Filled 2024-06-12: qty 30, 30d supply, fill #0
  Filled 2024-08-28: qty 30, 30d supply, fill #1

## 2024-06-12 MED ORDER — HYDROCODONE-ACETAMINOPHEN 10-325 MG PO TABS
1.0000 | ORAL_TABLET | Freq: Four times a day (QID) | ORAL | 0 refills | Status: DC | PRN
  Filled 2024-06-12: qty 120, 30d supply, fill #0

## 2024-06-12 MED ORDER — AMPHETAMINE-DEXTROAMPHETAMINE 10 MG PO TABS
10.0000 mg | ORAL_TABLET | Freq: Two times a day (BID) | ORAL | 0 refills | Status: DC
  Filled 2024-06-12: qty 60, 30d supply, fill #0

## 2024-06-12 NOTE — Telephone Encounter (Signed)
 Last OV 05/23/24. Please advise

## 2024-06-13 ENCOUNTER — Other Ambulatory Visit: Payer: Self-pay

## 2024-07-11 ENCOUNTER — Encounter: Payer: Self-pay | Admitting: Neurology

## 2024-07-11 ENCOUNTER — Other Ambulatory Visit: Payer: Self-pay

## 2024-07-11 DIAGNOSIS — G40009 Localization-related (focal) (partial) idiopathic epilepsy and epileptic syndromes with seizures of localized onset, not intractable, without status epilepticus: Secondary | ICD-10-CM

## 2024-07-11 DIAGNOSIS — R569 Unspecified convulsions: Secondary | ICD-10-CM

## 2024-07-14 ENCOUNTER — Encounter: Payer: Self-pay | Admitting: Internal Medicine

## 2024-07-19 ENCOUNTER — Other Ambulatory Visit: Payer: Self-pay | Admitting: Internal Medicine

## 2024-07-19 ENCOUNTER — Other Ambulatory Visit (HOSPITAL_COMMUNITY): Payer: Self-pay

## 2024-07-20 ENCOUNTER — Other Ambulatory Visit: Payer: Self-pay

## 2024-07-21 ENCOUNTER — Encounter: Payer: Self-pay | Admitting: Internal Medicine

## 2024-07-23 ENCOUNTER — Other Ambulatory Visit: Payer: Self-pay

## 2024-07-23 ENCOUNTER — Other Ambulatory Visit (HOSPITAL_COMMUNITY): Payer: Self-pay

## 2024-07-23 MED ORDER — HYDROCODONE-ACETAMINOPHEN 10-325 MG PO TABS
1.0000 | ORAL_TABLET | Freq: Four times a day (QID) | ORAL | 0 refills | Status: DC | PRN
  Filled 2024-07-23: qty 120, 30d supply, fill #0

## 2024-07-23 MED ORDER — AMLODIPINE-OLMESARTAN 10-40 MG PO TABS
1.0000 | ORAL_TABLET | Freq: Every day | ORAL | 3 refills | Status: AC
  Filled 2024-07-23: qty 90, 90d supply, fill #0
  Filled 2024-10-14: qty 90, 90d supply, fill #1

## 2024-07-23 MED ORDER — AMPHETAMINE-DEXTROAMPHETAMINE 10 MG PO TABS
10.0000 mg | ORAL_TABLET | Freq: Two times a day (BID) | ORAL | 0 refills | Status: DC
  Filled 2024-07-23: qty 60, 30d supply, fill #0

## 2024-07-24 ENCOUNTER — Other Ambulatory Visit: Payer: Self-pay

## 2024-08-07 ENCOUNTER — Encounter: Payer: Self-pay | Admitting: Internal Medicine

## 2024-08-07 ENCOUNTER — Ambulatory Visit: Admitting: Internal Medicine

## 2024-08-07 VITALS — BP 126/74 | HR 91 | Temp 98.1°F | Ht 67.0 in

## 2024-08-07 DIAGNOSIS — R634 Abnormal weight loss: Secondary | ICD-10-CM

## 2024-08-07 DIAGNOSIS — E538 Deficiency of other specified B group vitamins: Secondary | ICD-10-CM

## 2024-08-07 DIAGNOSIS — M5440 Lumbago with sciatica, unspecified side: Secondary | ICD-10-CM

## 2024-08-07 DIAGNOSIS — G8929 Other chronic pain: Secondary | ICD-10-CM

## 2024-08-07 DIAGNOSIS — E559 Vitamin D deficiency, unspecified: Secondary | ICD-10-CM

## 2024-08-07 DIAGNOSIS — Z7721 Contact with and (suspected) exposure to potentially hazardous body fluids: Secondary | ICD-10-CM

## 2024-08-07 DIAGNOSIS — I10 Essential (primary) hypertension: Secondary | ICD-10-CM

## 2024-08-07 DIAGNOSIS — F988 Other specified behavioral and emotional disorders with onset usually occurring in childhood and adolescence: Secondary | ICD-10-CM | POA: Diagnosis not present

## 2024-08-07 NOTE — Assessment & Plan Note (Signed)
 On SQ B12

## 2024-08-07 NOTE — Assessment & Plan Note (Addendum)
 Chronic Reduce Azor  to 1/2 tab if low BP after wt loss

## 2024-08-07 NOTE — Assessment & Plan Note (Signed)
Norco prn  Potential benefits of a long term opioids use as well as potential risks (i.e. addiction risk, apnea etc) and complications (i.e. Somnolence, constipation and others) were explained to the patient and were aknowledged. 

## 2024-08-07 NOTE — Assessment & Plan Note (Addendum)
 Off Truvada  Labs at next OV

## 2024-08-07 NOTE — Progress Notes (Addendum)
 Subjective:  Patient ID: Mark Mcgee, male    DOB: 25-Nov-1965  Age: 58 y.o. MRN: [id number]  CC: Medical Management of Chronic Issues (3 month follow up)   HPI Deward DELENA Fleming presents for LBP, ADD, anxiety, HTN Seizures are better on Lamictal   Outpatient Medications Prior to Visit  Medication Sig Dispense Refill   acetaminophen  (TYLENOL ) 325 MG tablet Take 325-650 mg by mouth every 6 (six) hours as needed for mild pain or headache.      albuterol  (VENTOLIN  HFA) 108 (90 Base) MCG/ACT inhaler Inhale 2 puffs into the lungs every 4 (four) hours as needed for wheezing or shortness of breath. 18 g 5   alprazolam  (XANAX ) 2 MG tablet Take 0.5 tablets (1 mg total) by mouth 4 (four) times daily as needed for sleep or anxiety. 120 tablet 1   amLODipine -olmesartan  (AZOR ) 10-40 MG tablet Take 1 tablet by mouth daily. 90 tablet 3   amphetamine -dextroamphetamine  (ADDERALL) 10 MG tablet Take 1 tablet (10 mg total) by mouth 2 (two) times daily with breakfast and lunch. 60 tablet 0   b complex vitamins tablet Take 1 tablet by mouth daily.     Cholecalciferol  (VITAMIN D3) 50 MCG (2000 UT) capsule Take 1 capsule (2,000 Units total) by mouth daily. 100 capsule 3   cyanocobalamin  (VITAMIN B12) 1000 MCG/ML injection Inject 1 mL (1,000 mcg total) into the skin every 21 ( twenty-one) days.     diazePAM , 20 MG Dose, (VALTOCO  20 MG DOSE) 2 x 10 MG/0.1ML LQPK Spray in nose as instructed for seizure. May use second dose after 4 hours. 4 each 5   Flaxseed, Linseed, (FLAX SEEDS) POWD 2 (two) times daily as needed (as directed when mixing into health shakes).     Fluticasone -Umeclidin-Vilant (TRELEGY ELLIPTA ) 100-62.5-25 MCG/ACT AEPB Inhale 1 puff into the lungs daily. 60 each 11   glycopyrrolate  (ROBINUL ) 2 MG tablet Take 1 tablet (2 mg total) by mouth 2 (two) times daily. 180 tablet 3   HYDROcodone -acetaminophen  (NORCO) 10-325 MG tablet Take 1 tablet by mouth every 6 (six) hours as needed. 120 tablet 0    lamoTRIgine  (LAMICTAL ) 200 MG tablet Take 1 tablet (200 mg total) by mouth in the morning AND 1.5 tablets (300 mg total) every evening. 225 tablet 3   loperamide  (IMODIUM ) 2 MG capsule Take 2 mg by mouth as needed for diarrhea or loose stools.     mirtazapine  (REMERON ) 7.5 MG tablet Take 1 tablet (7.5 mg total) by mouth at bedtime. 30 tablet 1   Multiple Vitamin (MULTIVITAMIN) capsule Take 1 capsule by mouth daily.      Oyster Shell (OYSTER CALCIUM) 500 MG TABS tablet Take 500 mg of elemental calcium by mouth daily.     perampanel  (FYCOMPA ) 2 MG tablet Take 1 tablet (2 mg total) by mouth every evening. 30 tablet 5   SYRINGE-NEEDLE, DISP, 3 ML 22G X 1-1/2 3 ML MISC Use IM as directed 50 each 3   tadalafil  (CIALIS ) 5 MG tablet Take 1 tablet (5 mg total) by mouth daily. 90 tablet 3   testosterone  cypionate (DEPOTESTOSTERONE CYPIONATE) 200 MG/ML injection Inject 200 mg IM every 10 days     zinc  gluconate 50 MG tablet Take 100 mg by mouth daily.     acyclovir  (ZOVIRAX ) 400 MG tablet Take 1 tablet (400 mg total) by mouth 4 (four) times daily as needed (as directed for outbreaks). (Patient not taking: Reported on 08/07/2024) 60 tablet 3   diphenoxylate -atropine  (LOMOTIL ) 2.5-0.025 MG tablet  TAKE 1 OR 2 TABLETS BY MOUTH 4 TIMES DAILY AS NEEDED FOR DIARRHEA OR LOOSE STOOLS MAX OF 8 TABLETS PER DAY (Patient not taking: Reported on 08/07/2024) 60 tablet 3   No facility-administered medications prior to visit.    ROS: Review of Systems  Constitutional:  Positive for unexpected weight change. Negative for appetite change and fatigue.  HENT:  Negative for congestion, nosebleeds, sneezing, sore throat and trouble swallowing.   Eyes:  Negative for itching and visual disturbance.  Respiratory:  Negative for cough.   Cardiovascular:  Negative for chest pain, palpitations and leg swelling.  Gastrointestinal:  Positive for diarrhea. Negative for abdominal distention, abdominal pain, blood in stool and nausea.   Genitourinary:  Negative for frequency, hematuria and urgency.  Musculoskeletal:  Positive for back pain. Negative for gait problem, joint swelling and neck pain.  Skin:  Negative for rash.  Neurological:  Positive for dizziness and seizures. Negative for tremors, speech difficulty and weakness.  Hematological:  Does not bruise/bleed easily.  Psychiatric/Behavioral:  Positive for dysphoric mood and sleep disturbance. Negative for agitation and suicidal ideas. The patient is not nervous/anxious.     Objective:  BP 126/74   Pulse 91   Temp 98.1 F (36.7 C)   Ht 5' 7 (1.702 m)   SpO2 99%   BMI 31.17 kg/m   BP Readings from Last 3 Encounters:  08/07/24 126/74  05/03/24 107/73  02/01/24 108/70    Wt Readings from Last 3 Encounters:  05/23/24 199 lb (90.3 kg)  05/03/24 211 lb (95.7 kg)  02/01/24 231 lb 6.4 oz (105 kg)    Physical Exam Constitutional:      General: He is not in acute distress.    Appearance: He is well-developed.     Comments: NAD  Eyes:     Conjunctiva/sclera: Conjunctivae normal.     Pupils: Pupils are equal, round, and reactive to light.  Neck:     Thyroid : No thyromegaly.     Vascular: No JVD.  Cardiovascular:     Rate and Rhythm: Normal rate and regular rhythm.     Heart sounds: Normal heart sounds. No murmur heard.    No friction rub. No gallop.  Pulmonary:     Effort: Pulmonary effort is normal. No respiratory distress.     Breath sounds: Normal breath sounds. No wheezing or rales.  Chest:     Chest wall: No tenderness.  Abdominal:     General: Bowel sounds are normal. There is no distension.     Palpations: Abdomen is soft. There is no mass.     Tenderness: There is no abdominal tenderness. There is no guarding or rebound.  Musculoskeletal:        General: No tenderness. Normal range of motion.     Cervical back: Normal range of motion.  Lymphadenopathy:     Cervical: No cervical adenopathy.  Skin:    General: Skin is warm and dry.      Findings: No rash.  Neurological:     Mental Status: He is alert and oriented to person, place, and time.     Cranial Nerves: No cranial nerve deficit.     Motor: No abnormal muscle tone.     Coordination: Coordination normal.     Gait: Gait normal.     Deep Tendon Reflexes: Reflexes are normal and symmetric.  Psychiatric:        Behavior: Behavior normal.        Thought Content: Thought content normal.  Judgment: Judgment normal.     Lab Results  Component Value Date   WBC 7.5 05/03/2024   HGB 13.6 05/03/2024   HCT 43.2 05/03/2024   PLT 255.0 05/03/2024   GLUCOSE 83 05/03/2024   CHOL 159 05/03/2024   TRIG 101.0 05/03/2024   HDL 48.60 05/03/2024   LDLDIRECT 91.0 12/04/2019   LDLCALC 90 05/03/2024   ALT 11 05/03/2024   AST 11 05/03/2024   NA 140 05/03/2024   K 5.0 05/03/2024   CL 101 05/03/2024   CREATININE 1.37 05/03/2024   BUN 14 05/03/2024   CO2 21 05/03/2024   TSH 2.67 05/03/2024   PSA 0.63 05/03/2024   INR 0.9 05/16/2023    MR BRAIN W WO CONTRAST Result Date: 03/29/2024 CLINICAL DATA:  Provided history: Localization-related idiopathic epilepsy and epileptic syndromes with seizures of localized onset, not intractable, without status epilepticus. Convulsions, unspecified convulsion type. Nonepileptic episode. EXAM: MRI HEAD WITHOUT AND WITH CONTRAST TECHNIQUE: Multiplanar, multiecho pulse sequences of the brain and surrounding structures were obtained without and with intravenous contrast. CONTRAST:  10 mL Vueway  intravenous contrast. COMPARISON:  Brain MRI 10/12/2022. FINDINGS: Brain: Cerebral atrophy is similar to the prior MRI of 10/12/2022. As before, there is moderately prominent enlargement of the atria and occipital horns of the lateral ventricles (right greater than left) with surrounding white matter volume loss. Few tiny nonspecific foci of T2 FLAIR hyperintense signal abnormality scattered elsewhere within the cerebral white matter. No cortical  encephalomalacia is identified. No appreciable hippocampal size or signal asymmetry. There is no acute infarct. No evidence of an intracranial mass. No chronic intracranial blood products. No extra-axial fluid collection. No midline shift. No pathologic intracranial enhancement identified. Vascular: Maintained flow voids within the proximal large arterial vessels. Skull and upper cervical spine: No focal worrisome marrow lesion. Incompletely assessed cervical spondylosis. Sinuses/Orbits: No mass or acute finding within the imaged orbits. Mild mucosal thickening scattered within the paranasal sinuses. IMPRESSION: 1.  No evidence of an acute intracranial abnormality. 2. Cerebral atrophy including prominent white matter volume loss in the posterior cerebral hemispheres (right greater than left), similar to the prior MRI of 10/12/2022. 3. Few tiny nonspecific chronic insults within the cerebral white matter, also similar to the prior exam. 4. Mild paranasal sinus mucosal thickening. Electronically Signed   By: Rockey Childs D.O.   On: 03/29/2024 09:45    Assessment & Plan:   Problem List Items Addressed This Visit     ADD (attention deficit disorder) without hyperactivity   On 1/2 dose      B12 deficiency - Primary   On SQ B12      Exposure to blood or body fluid   Off Truvada  Labs at next OV      Hypertension   Chronic Reduce Azor  to 1/2 tab if low BP after wt loss      LOW BACK PAIN   Norco prn  Potential benefits of a long term opioids use as well as potential risks (i.e. addiction risk, apnea etc) and complications (i.e. Somnolence, constipation and others) were explained to the patient and were aknowledged.        Vitamin D  deficiency   On Vit D      Weight loss   On diet          No orders of the defined types were placed in this encounter.     Follow-up: Return in about 3 months (around 11/07/2024) for a follow-up visit.  Marolyn Noel, MD

## 2024-08-07 NOTE — Assessment & Plan Note (Signed)
 On Vit D

## 2024-08-07 NOTE — Assessment & Plan Note (Signed)
 On 1/2 dose

## 2024-08-07 NOTE — Assessment & Plan Note (Signed)
  On diet  

## 2024-08-28 ENCOUNTER — Other Ambulatory Visit: Payer: Self-pay | Admitting: Internal Medicine

## 2024-08-28 ENCOUNTER — Other Ambulatory Visit: Payer: Self-pay

## 2024-08-28 MED ORDER — VITAMIN D3 50 MCG (2000 UT) PO CAPS
2000.0000 [IU] | ORAL_CAPSULE | Freq: Every day | ORAL | 3 refills | Status: AC
  Filled 2024-08-28: qty 100, 100d supply, fill #0

## 2024-08-28 MED ORDER — HYDROCODONE-ACETAMINOPHEN 10-325 MG PO TABS
1.0000 | ORAL_TABLET | Freq: Four times a day (QID) | ORAL | 0 refills | Status: DC | PRN
  Filled 2024-08-28: qty 120, 30d supply, fill #0

## 2024-08-28 MED ORDER — AMPHETAMINE-DEXTROAMPHETAMINE 10 MG PO TABS
10.0000 mg | ORAL_TABLET | Freq: Two times a day (BID) | ORAL | 0 refills | Status: DC
  Filled 2024-08-28: qty 60, 30d supply, fill #0

## 2024-08-31 ENCOUNTER — Other Ambulatory Visit (HOSPITAL_BASED_OUTPATIENT_CLINIC_OR_DEPARTMENT_OTHER): Payer: Self-pay

## 2024-08-31 ENCOUNTER — Other Ambulatory Visit: Payer: Self-pay

## 2024-09-12 ENCOUNTER — Ambulatory Visit: Admitting: Neurology

## 2024-09-14 ENCOUNTER — Other Ambulatory Visit

## 2024-09-14 ENCOUNTER — Other Ambulatory Visit: Payer: Self-pay

## 2024-09-14 ENCOUNTER — Encounter: Payer: Self-pay | Admitting: Neurology

## 2024-09-14 ENCOUNTER — Ambulatory Visit: Admitting: Neurology

## 2024-09-14 VITALS — BP 125/78 | HR 85 | Resp 20 | Ht 67.0 in | Wt 165.0 lb

## 2024-09-14 DIAGNOSIS — R569 Unspecified convulsions: Secondary | ICD-10-CM

## 2024-09-14 DIAGNOSIS — H9311 Tinnitus, right ear: Secondary | ICD-10-CM

## 2024-09-14 DIAGNOSIS — R4189 Other symptoms and signs involving cognitive functions and awareness: Secondary | ICD-10-CM

## 2024-09-14 MED ORDER — LAMOTRIGINE 200 MG PO TABS
ORAL_TABLET | ORAL | 3 refills | Status: AC
  Filled 2024-09-14: qty 225, 90d supply, fill #0

## 2024-09-14 NOTE — Progress Notes (Unsigned)
 Medication Samples have been provided to the patient.  Drug name: aptiom       Strength: 200        Qty: 2  LOT: [id number]  Exp.Date: 04/2025  Dosing instructions: TAKE AS DIRECTED   The patient has been instructed regarding the correct time, dose, and frequency of taking this medication, including desired effects and most common side effects.

## 2024-09-14 NOTE — Patient Instructions (Signed)
 Good to see you.  Have bloodwork done for Lamictal  level  2. Try the Aptiom 200mg  every night. Please update me as you are close to finishing the samples, if no side effects, I will send in a prescription  3. Continue Lamictal  200mg : take 1 tablet in AM, 1.5 tablets in PM  4. Once under [facility], please let me know so we can send in order for inpatient video EEG study  5. Follow-up in 3-4 months, call for any changes   Seizure Precautions: 1. If medication has been prescribed for you to prevent seizures, take it exactly as directed.  Do not stop taking the medicine without talking to your doctor first, even if you have not had a seizure in a long time.   2. Avoid activities in which a seizure would cause danger to yourself or to others.  Don't operate dangerous machinery, swim alone, or climb in high or dangerous places, such as on ladders, roofs, or girders.  Do not drive unless your doctor says you may.  3. If you have any warning that you may have a seizure, lay down in a safe place where you can't hurt yourself.    4.  No driving for 6 months from last seizure, as per [place]  state law.   Please refer to the following link on the [employer]'s website for more information: http://www.epilepsyfoundation.org/answerplace/Social/driving/drivingu.cfm   5.  Maintain good sleep hygiene.  6.  Contact your doctor if you have any problems that may be related to the medicine you are taking.  7.  Call 911 and bring the patient back to the ED if:        A.  The seizure lasts longer than 5 minutes.       B.  The patient doesn't awaken shortly after the seizure  C.  The patient has new problems such as difficulty seeing, speaking or moving  D.  The patient was injured during the seizure  E.  The patient has a temperature over 102 F (39C)  F.  The patient vomited and now is having trouble breathing

## 2024-09-14 NOTE — Progress Notes (Unsigned)
 "  NEUROLOGY FOLLOW UP OFFICE NOTE  Mark Mcgee [id number] 05/12/1966  Discussed the use of AI scribe software for clinical note transcription with the patient, who gave verbal consent to proceed.  History of Present Illness Mark Mcgee is a 58 year old male with epilepsy who presents with frequent seizures and medication management.  He experiences seizures approximately twice a week without any identifiable pattern. The most recent seizure occurred on Wednesday at 4:30 PM, during which he woke up on the floor. He maintains a detailed journal of his activities, diet, and sleep to identify potential triggers, but has not found any correlations. During a recent seizure on Saturday, witnessed by his niece, he exhibited left-sided arm and leg shaking lasting one to two minutes. He often feels muscle soreness on the left side upon waking.  He has previously tried Fycompa  but experienced side effects. Currently, he is on Lamictal , taking one dose in the morning and one and a half at night, with no adverse effects reported. He has made significant dietary changes, cutting out processed foods and sugar, resulting in unintentional weight loss. He has also stopped drinking coffee and now drinks green tea, which has improved his sleep quality, as noted by his niece, who observed that he did not snore over a weekend stay.  He experiences persistent tinnitus, described as constant ringing in his ears, which is not alleviated by pink or green noise. No hearing loss has been noticed, and he has not yet consulted an ENT specialist.  Socially, he lives alone and is unable to drive due to his condition, impacting his ability to assist his family. His mother, who lives nearby, is experiencing worsening memory issues, and his stepfather has poorly controlled diabetes. His sister, who lives in [place], has been paralyzed following a reaction to the COVID vaccine and is cared for by her aunt and niece. He  relies on family and friends for transportation and support.  He is concerned about his current insurance, which has high copayments and limited coverage for medications and therapy. He anticipates a change in insurance coverage in February, which he hopes will improve his situation.  HISTORY OF PRESENT ILLNESS: I had the pleasure of seeing Mark Mcgee in follow-up in the neurology clinic on 09/14/2024.  The patient was last seen 4 months ago. He started having seizures in 07/2022. EMU monitoring in 04/2023 was normal, there was a mild episode with left arm twitching with no EEG correlate. He continued to report frequent seizures where he would fall and hurt himself, as well as more cognitive changes. He could not do another EMU admission due to work and had another 47-hour ambulatory EEG in 11/2023 however there was no video. Two push button events had muscle/movement artifact that appeared somewhat rhythmic, there were no epileptiform changes seen with them. He continued to report recurrent spells where he would injure himself. Perampanel  was started in 04/2024 but it caused mood changes so he stopped it. He is on Lamotrigine  200mg  in AM, 300mg  in PM. He continues to have the episodes, noticing that the longer hours he works, they would tend to occur a day or 2 later. He has been working on walking and meditation. He has continued to wake up on the floor or fall out of bed. It takes him long to think straight after an episode, cutting work time down that he has to make it up the next day, making him work later hours. There has also been increased  stress taking care of his mother and depending on his aunt to drive him. His sister also has cerebral atrophy with more falls so he has been helping her as well.    Last was Wed, 4:30pm woke up on the floor Wakes up with muscles sore on the left side, neck and arm Sat niece heard fall and found him Left arm was out straight shaking, left leg slightly shaking,  1-2 mins Sleeping better; niece said he did not snore the whole weekend Does not wake up feling tired Ringing in ear is constant 8 a month, 2 days per episode  On his last visit, aside from continued spells where he injures himself, he was also reporting worsening cognitive issues affecting work. He had a repeat brain MRI with and without contrast 03/2024 with no acute changes. There is again note of cerebral atrophy including prominent white matter volume loss in the posterior cerebral hemispheres (right greater than left), unchanged from 2024. He underwent Neurocognitive testing in 03/2024 with normal, if not superior, cognitive functioning, no evidence of a neurocognitive disorder seen. It was noted that subjective cognitive complaints are likely related to normal aging and personal factors such as mood, poor sleep, attention challenges. He continues to do CBT but has not spoken to his therapist in 2 weeks.   He continues to report recurrent seizures 1-2 times a week, he had 2 last Monday, one while sitting at the desk, he woke up with his head on the desk with a nosebleed. He went down to clean up, then 2 hours later he got up then woke up in the kitchen. No prior warning. Prior to this, he had one Thursday last week in the garden, he woke up on the ground. He is on Lamotrigine  200mg  in AM, 300mg  in PM. He was started on Gabapentin  for tremors, anxiety, and possible seizures, but he self-weaned it off 1.5 months ago because he was feeling nauseated/sick and gained so much weight. His main worry is his work. 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 His mother's health is also declining. He does not sleep long, getting 6 hours of sleep. He rarely takes the mirtazapine . [name] is home periodically but he is basically home alone. He continues to have significant tinnitus, he has  tried masking with no improvement. He cannot focus because it is so distracting.    History on Initial Assessment 09/21/2022: This is a pleasant 58 year old right-handed man with a history of hypertension, hyperthyroidism, IBS, anxiety, tremor, presenting for evaluation of new onset seizures. He denies any prior history of seizures until 08/21/22 when he had 3 seizures in one day. He was stressed out after putting his dog to sleep. He recalls driving home and pulling into the driveway but pulled in crooked. His right leg was slamming the break and he could not let go, he knew what was going on but could not do anything. He felt like he was stuck. His friend in the car with him was able to stop the car. He was able to get out of the car and then his friend witnessed a generalized convulsion. He had shaking so bad it dented the side of his car with his arm. His friend lay him on his side, then he woke up and recalls getting up and walking around the car, then he  felt like he was being choked then had another seizure. After this, he recalls getting up and going in the house then proceeded to have a third convulsion where he had urinary incontinence then vomited after. When EMS arrived, he was mildly confused with a hard time finding words. He recalls feeling very jittery, no focal weakness. He was brought to the ER where bloodwork showed a WBC of 15, creatinine 1.33. EKG NSR. Head CT no acute changes. UDS positive for opiates and THC. He is on hydrocodone  for chronic jaw pain and using CBD gummies to help with sleep/anxiety. Since then, he has had at least 5 more episodes of loss of consciousness where he wakes up on the floor. Last episode was last night, he went to the mantel to fix something then woke up on the floor. There was one witnessed incident 2 weeks ago where his friend told him he was staring and unresponsive for 5 minutes. He gets flashes in his eyes before the episodes, then when he wakes up it feels  like he went through a workout at the gym. His chest muscles still hurt. Since then, he has also had spasms where what he is holding in his hand goes flying out. He loses his train of thought a lot. Since the initial seizures, he could not recall how to do things at work that he had been doing for over 6 years (works with [employer]). He feels like his brain is scrambled. He has not prior history of headaches but since the seizures has had constant headaches with pressure on both side, 2/10 in intensity today, no nausea/vomiting. He is sensitive to lights. He has had loss of appetite. He has bilateral tinnitus, worse at night. Masking has not helped. HE is not sleeping well, getting 4-6 hours of sleep. Prior to the seizures, he was taking alprazolam  around 2 times a week, but since the seizures he has had a constant nervousness and worsening hand tremors and taking 1/4-1/2 tablet daily. His PCP started Levetiracetam  500mg  BID which is making him sleepy. He denies any dizziness, diplopia, neck/back pain, bowel/bladder dysfunction. He was born 3 months premature. 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 He had a normal early development.  There is no history of febrile convulsions, CNS infections such as meningitis/encephalitis, significant traumatic brain injury, neurosurgical procedures, or family history of seizures. His sister also has tremors.  Prior ASMs: Levetiracetam , oxcarbazepine , Zonisamide , Gabapentin , Perampanel  (mood changes)   Diagnostic Data: Brain MRI with and without contrast done 09/2022 no acute changes. There is moderately prominent enlargement of the atria and occipital horns of the right greater than left lateral ventricles with associated surrounding white matter volume loss. One or two small foci of T2 FLAIR hyperintensity in the cerebral white matter are within normal limits for age.   Repeat brain MRI with and without contrast 03/2024 no acute changes. There is again  note of cerebral atrophy including prominent white matter volume loss in the posterior cerebral hemispheres (right greater than left), unchanged from 2024.  Neurocognitive testing in 03/2024 showed normal, if not superior, cognitive functioning, no evidence of a neurocognitive disorder seen. It was noted that subjective cognitive complaints are likely related to normal aging and personal factors such as mood, poor sleep, attention challenges.  Lumbar puncture 11/2022: mildly elevated CSF protein 56, normal cell count, glucose. Negative autoimmune panel.  Holter monitor 06/2023: No sustained arrhythmias, 4 nonsustained SVT, longest 13 beats, no  atrial fibrillation. Symptom trigger episodes correspond to sinus rhythm +/- PACs  Routine EEG 08/2022 normal  69-hour Ambulatory EEG 12/2022 normal. Push button events showed movement artifact, no epileptiform discharges seen. No video recorded.   EMU monitoring 8/12-23/2024 showed normal baseline EEG. There was 1 push button event for left index finger twitching followed by left arm twitching with no EEG correlate.  49-hour Ambulatory EEG 11/2023 normal. There were 5 push button events for rhythmic muscle artifact lasting 27 to 75 seconds with no epileptiform activity seen. Since video was not captured, semiology of these episodes cannot be described. Inpatient video EEG is strongly recommended.    PAST MEDICAL HISTORY: Past Medical History:  Diagnosis Date   Anxiety    Costochondritis    recurrent    Depression    Dr Carole   Depression    Dr Carole d/c  Dr Barbra  Off work till December  2024  Off work till December  Per Dr Georjean:   58 yo RH man with a history of hypertension, hyperthyroidism, IBS, anxiety, tremor, with new onset seizures since 08/21/2022. He has nocturnal convulsions and staring episodes. 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        Prostatitis    Very painful flare-ups Dr Matilda   Tremor     MEDICATIONS: Medications Ordered Prior to Encounter[1]  ALLERGIES: Allergies[2]  FAMILY HISTORY: Family History  Problem Relation Age of Onset   Hyperlipidemia Other    Coronary artery disease Other    Stroke Mother    Colon cancer Paternal Grandmother    Diabetes Other    Breast cancer Other    Pancreatic cancer Other    Cancer Other        breast, pancreatic   Heart disease Other    Cancer Maternal Grandmother        colon    SOCIAL HISTORY: Social History   Socioeconomic History   Marital status: Single    Spouse name: Not on file   Number of children: 0   Years of education: 18   Highest education level: Not on file  Occupational History   Occupation: Teacher, Adult Education: [employer]  Tobacco Use   Smoking status: Never   Smokeless tobacco: Never  Vaping Use   Vaping status: Never Used  Substance and Sexual Activity   Alcohol use: Not Currently    Comment: None currently.  Previously 2-6 wine every 2/wks   Drug use: Not Currently    Comment: Previous CBD oil use   Sexual activity: Yes  Other Topics Concern   Not on file  Social History Narrative   Are you right handed or left handed? right   Are you currently employed ? 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 no   Who  lives with you? Room mate   What type of home do you live in: 1 story or 2 story? 1.5 story        Social Drivers of Health   Tobacco Use:  Low Risk (08/07/2024)   Patient History    Smoking Tobacco Use: Never    Smokeless Tobacco Use: Never    Passive Exposure: Not on file  Financial Resource Strain: Not on file  Food Insecurity: No Food Insecurity (05/16/2023)   Hunger Vital Sign    Worried About Running Out of Food in the Last Year: Never true    Ran Out of Food in the Last Year: Never true  Transportation Needs: No Transportation Needs (05/16/2023)   PRAPARE - Administrator, Civil Service (Medical): No    Lack of Transportation (Non-Medical): No  Physical Activity: Not on file  Stress: Not on file  Social Connections: Not on file  Intimate Partner Violence: Not At Risk (05/16/2023)   Humiliation, Afraid, Rape, and Kick questionnaire    Fear of Current or Ex-Partner: No    Emotionally Abused: No    Physically Abused: No    Sexually Abused: No  Depression (PHQ2-9): Low Risk (08/07/2024)   Depression (PHQ2-9)    PHQ-2 Score: 3  Alcohol Screen: Not on file  Housing: Low Risk (05/16/2023)   Housing    Last Housing Risk Score: 0  Utilities: At Risk (05/16/2023)   [facility] Utilities    Threatened with loss of utilities: Yes  Health Literacy: Not on file     PHYSICAL EXAM: There were no vitals filed for this visit. General: No acute distress Head:  Normocephalic/atraumatic Skin/Extremities: No rash, no edema Neurological Exam: alert and oriented to person, place, and time. No aphasia or dysarthria. Fund of knowledge is appropriate.  Recent and remote memory are intact.  Attention and concentration are normal.   Cranial nerves: Pupils equal, round. Extraocular movements intact with no nystagmus. Visual fields full.  No facial asymmetry.  Motor: Bulk and tone normal, muscle strength 5/5 throughout with no pronator drift.   Finger to nose testing intact.  Gait narrow-based and steady, able to tandem walk adequately.  Romberg negative.   IMPRESSION: This is a pleasant 58 yo RH man with a history of hypertension,  hyperthyroidism, IBS, anxiety, tremor, with new onset seizures since 08/21/2022. He has been having convulsions and staring episodes. MRI brain 09/2022 and 03/2024 no acute changes however there is note of moderate prominent enlargement of the atria and occipital horns of the right greater than left lateral ventricles, hippocampi symmetric. His ambulatory EEG in 12/2022 and 03/2024 captured shaking episodes with no EEG correlate, normal baseline EEG. EMU admission in 04/2023 for 5 days did not capture typical events. He continues to report recurrent episodes 1-2 times a week, leading to injuries. At this point, I strongly recommend another inpatient EMU admission for characterization. Continue Lamotrigine  200mg  in AM, 300mg  in PM. We discussed adding Perampanel  2mg  at bedtime until then. Side effects discussed, he will update me in 2 weeks, we may uptitrate as tolerated. We discussed Neurocognitive evaluation results, he continues to report cognitive concerns affecting work. Continue CBT with Glendia Maywood. He was advised to see ENT for the significant tinnitus. He does not drive. Follow-up in 3 months, call for any changes.     Thank you for allowing me to participate in *** care.  Please do not hesitate to call for any questions or concerns.  The duration of this  appointment visit was *** minutes of face-to-face time with the patient.  Greater than 50% of this time was spent in counseling, explanation of diagnosis, planning of further management, and coordination of care.   Darice Shivers, M.D.   CC: ***     [1]  Current Outpatient Medications on File Prior to Visit  Medication Sig Dispense Refill   acetaminophen  (TYLENOL ) 325 MG tablet Take 325-650 mg by mouth every 6 (six) hours as needed for mild pain or headache.      acyclovir  (ZOVIRAX ) 400 MG tablet Take 1 tablet (400 mg total) by mouth 4 (four) times daily as needed (as directed for outbreaks). (Patient not taking: Reported on 08/07/2024) 60  tablet 3   albuterol  (VENTOLIN  HFA) 108 (90 Base) MCG/ACT inhaler Inhale 2 puffs into the lungs every 4 (four) hours as needed for wheezing or shortness of breath. 18 g 5   alprazolam  (XANAX ) 2 MG tablet Take 0.5 tablets (1 mg total) by mouth 4 (four) times daily as needed for sleep or anxiety. 120 tablet 1   amLODipine -olmesartan  (AZOR ) 10-40 MG tablet Take 1 tablet by mouth daily. 90 tablet 3   amphetamine -dextroamphetamine  (ADDERALL) 10 MG tablet Take 1 tablet (10 mg total) by mouth 2 (two) times daily with breakfast and lunch. 60 tablet 0   b complex vitamins tablet Take 1 tablet by mouth daily.     Cholecalciferol  (VITAMIN D3) 50 MCG (2000 UT) capsule Take 1 capsule (2,000 Units total) by mouth daily. 100 capsule 3   cyanocobalamin  (VITAMIN B12) 1000 MCG/ML injection Inject 1 mL (1,000 mcg total) into the skin every 21 ( twenty-one) days.     diazePAM , 20 MG Dose, (VALTOCO  20 MG DOSE) 2 x 10 MG/0.1ML LQPK Spray in nose as instructed for seizure. May use second dose after 4 hours. 4 each 5   diphenoxylate -atropine  (LOMOTIL ) 2.5-0.025 MG tablet TAKE 1 OR 2 TABLETS BY MOUTH 4 TIMES DAILY AS NEEDED FOR DIARRHEA OR LOOSE STOOLS MAX OF 8 TABLETS PER DAY (Patient not taking: Reported on 08/07/2024) 60 tablet 3   Flaxseed, Linseed, (FLAX SEEDS) POWD 2 (two) times daily as needed (as directed when mixing into health shakes).     Fluticasone -Umeclidin-Vilant (TRELEGY ELLIPTA ) 100-62.5-25 MCG/ACT AEPB Inhale 1 puff into the lungs daily. 60 each 11   glycopyrrolate  (ROBINUL ) 2 MG tablet Take 1 tablet (2 mg total) by mouth 2 (two) times daily. 180 tablet 3   HYDROcodone -acetaminophen  (NORCO) 10-325 MG tablet Take 1 tablet by mouth every 6 (six) hours as needed. 120 tablet 0   lamoTRIgine  (LAMICTAL ) 200 MG tablet Take 1 tablet (200 mg total) by mouth in the morning AND 1.5 tablets (300 mg total) every evening. 225 tablet 3   loperamide  (IMODIUM ) 2 MG capsule Take 2 mg by mouth as needed for diarrhea or loose  stools.     mirtazapine  (REMERON ) 7.5 MG tablet Take 1 tablet (7.5 mg total) by mouth at bedtime. 30 tablet 1   Multiple Vitamin (MULTIVITAMIN) capsule Take 1 capsule by mouth daily.      Oyster Shell (OYSTER CALCIUM) 500 MG TABS tablet Take 500 mg of elemental calcium by mouth daily.     perampanel  (FYCOMPA ) 2 MG tablet Take 1 tablet (2 mg total) by mouth every evening. 30 tablet 5   SYRINGE-NEEDLE, DISP, 3 ML 22G X 1-1/2 3 ML MISC Use IM as directed 50 each 3   tadalafil  (CIALIS ) 5 MG tablet Take 1 tablet (5 mg total) by mouth daily. 90  tablet 3   testosterone  cypionate (DEPOTESTOSTERONE CYPIONATE) 200 MG/ML injection Inject 200 mg IM every 10 days     zinc  gluconate 50 MG tablet Take 100 mg by mouth daily.     No current facility-administered medications on file prior to visit.  [2]  Allergies Allergen Reactions   Buspirone Hcl Other (See Comments)    Reaction not recalled by the patient   Divalproex Sodium Other (See Comments)    Severe headaches   Gluten Meal Other (See Comments)   Olanzapine-Fluoxetine Hcl Other (See Comments)    Reaction not recalled by the patient   Other Other (See Comments)   Piroxicam Other (See Comments)    Reaction not recalled by the patient   Wheat Diarrhea and Other (See Comments)    Tears up my stomach   Ibuprofen  Rash and Other (See Comments)    Spots appear on chest appear   "

## 2024-10-14 ENCOUNTER — Other Ambulatory Visit: Payer: Self-pay | Admitting: Internal Medicine

## 2024-10-14 ENCOUNTER — Other Ambulatory Visit: Payer: Self-pay | Admitting: Neurology

## 2024-10-15 ENCOUNTER — Other Ambulatory Visit: Payer: Self-pay

## 2024-10-15 ENCOUNTER — Other Ambulatory Visit (HOSPITAL_COMMUNITY): Payer: Self-pay

## 2024-10-15 MED ORDER — HYDROCODONE-ACETAMINOPHEN 10-325 MG PO TABS
1.0000 | ORAL_TABLET | Freq: Four times a day (QID) | ORAL | 0 refills | Status: AC | PRN
  Filled 2024-10-15: qty 120, 30d supply, fill #0

## 2024-10-15 MED ORDER — AMPHETAMINE-DEXTROAMPHETAMINE 10 MG PO TABS
10.0000 mg | ORAL_TABLET | Freq: Two times a day (BID) | ORAL | 0 refills | Status: AC
  Filled 2024-10-15: qty 60, 30d supply, fill #0

## 2024-10-15 MED ORDER — TADALAFIL 5 MG PO TABS
5.0000 mg | ORAL_TABLET | Freq: Every day | ORAL | 3 refills | Status: AC
  Filled 2024-10-15: qty 90, 90d supply, fill #0

## 2024-10-15 MED ORDER — MIRTAZAPINE 7.5 MG PO TABS
7.5000 mg | ORAL_TABLET | Freq: Every day | ORAL | 1 refills | Status: AC
  Filled 2024-10-15: qty 30, 30d supply, fill #0

## 2024-10-15 MED ORDER — ALPRAZOLAM 2 MG PO TABS
1.0000 mg | ORAL_TABLET | Freq: Four times a day (QID) | ORAL | 1 refills | Status: AC | PRN
  Filled 2024-10-15: qty 120, 60d supply, fill #0

## 2024-10-16 ENCOUNTER — Other Ambulatory Visit: Payer: Self-pay

## 2024-10-30 ENCOUNTER — Other Ambulatory Visit: Payer: Self-pay

## 2024-11-08 ENCOUNTER — Ambulatory Visit: Payer: Self-pay | Admitting: Internal Medicine

## 2024-12-14 ENCOUNTER — Telehealth: Payer: Self-pay | Admitting: Neurology
